# Patient Record
Sex: Male | Born: 1955 | Race: White | Hispanic: No | Marital: Married | Smoking: Never smoker
Health system: Southern US, Community
[De-identification: ages and names within clinical notes are randomized; demographics above are authoritative.]

## PROBLEM LIST (undated history)

## (undated) DIAGNOSIS — I513 Intracardiac thrombosis, not elsewhere classified: Secondary | ICD-10-CM

## (undated) DIAGNOSIS — E039 Hypothyroidism, unspecified: Secondary | ICD-10-CM

## (undated) DIAGNOSIS — Z91199 Patient's noncompliance with other medical treatment and regimen due to unspecified reason: Secondary | ICD-10-CM

## (undated) DIAGNOSIS — I5021 Acute systolic (congestive) heart failure: Secondary | ICD-10-CM

## (undated) DIAGNOSIS — F419 Anxiety disorder, unspecified: Secondary | ICD-10-CM

## (undated) DIAGNOSIS — I5042 Chronic combined systolic (congestive) and diastolic (congestive) heart failure: Secondary | ICD-10-CM

## (undated) DIAGNOSIS — E079 Disorder of thyroid, unspecified: Secondary | ICD-10-CM

## (undated) DIAGNOSIS — Z9119 Patient's noncompliance with other medical treatment and regimen: Secondary | ICD-10-CM

## (undated) DIAGNOSIS — I428 Other cardiomyopathies: Secondary | ICD-10-CM

## (undated) DIAGNOSIS — E785 Hyperlipidemia, unspecified: Secondary | ICD-10-CM

## (undated) DIAGNOSIS — L97509 Non-pressure chronic ulcer of other part of unspecified foot with unspecified severity: Secondary | ICD-10-CM

## (undated) DIAGNOSIS — I509 Heart failure, unspecified: Secondary | ICD-10-CM

## (undated) DIAGNOSIS — E119 Type 2 diabetes mellitus without complications: Secondary | ICD-10-CM

## (undated) HISTORY — DX: Intracardiac thrombosis, not elsewhere classified: I51.3

## (undated) HISTORY — DX: Non-pressure chronic ulcer of other part of unspecified foot with unspecified severity: L97.509

## (undated) HISTORY — DX: Patient's noncompliance with other medical treatment and regimen due to unspecified reason: Z91.199

## (undated) HISTORY — DX: Hypothyroidism, unspecified: E03.9

## (undated) HISTORY — DX: Patient's noncompliance with other medical treatment and regimen: Z91.19

## (undated) HISTORY — DX: Type 2 diabetes mellitus without complications: E11.9

## (undated) HISTORY — DX: Hyperlipidemia, unspecified: E78.5

## (undated) HISTORY — DX: Chronic combined systolic (congestive) and diastolic (congestive) heart failure: I50.42

## (undated) HISTORY — PX: FOOT SURGERY: SHX648

## (undated) HISTORY — DX: Anxiety disorder, unspecified: F41.9

## (undated) HISTORY — DX: Other cardiomyopathies: I42.8

## (undated) HISTORY — DX: Disorder of thyroid, unspecified: E07.9

---
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
## 2001-03-18 ENCOUNTER — Emergency Department (HOSPITAL_COMMUNITY): Admission: EM | Admit: 2001-03-18 | Discharge: 2001-03-18 | Payer: Self-pay | Admitting: Emergency Medicine

## 2003-09-25 ENCOUNTER — Emergency Department (HOSPITAL_COMMUNITY): Admission: EM | Admit: 2003-09-25 | Discharge: 2003-09-25 | Payer: Self-pay | Admitting: Emergency Medicine

## 2004-01-25 ENCOUNTER — Ambulatory Visit (HOSPITAL_COMMUNITY): Admission: RE | Admit: 2004-01-25 | Discharge: 2004-01-25 | Payer: Self-pay | Admitting: Family Medicine

## 2004-05-09 ENCOUNTER — Ambulatory Visit: Payer: Self-pay | Admitting: Psychiatry

## 2004-11-05 ENCOUNTER — Ambulatory Visit: Payer: Self-pay | Admitting: Psychiatry

## 2005-05-08 ENCOUNTER — Ambulatory Visit: Payer: Self-pay | Admitting: Psychiatry

## 2005-11-11 ENCOUNTER — Ambulatory Visit (HOSPITAL_COMMUNITY): Payer: Self-pay | Admitting: Psychiatry

## 2006-05-05 ENCOUNTER — Ambulatory Visit (HOSPITAL_COMMUNITY): Payer: Self-pay | Admitting: Psychiatry

## 2006-10-29 ENCOUNTER — Ambulatory Visit (HOSPITAL_COMMUNITY): Payer: Self-pay | Admitting: Psychiatry

## 2007-05-11 ENCOUNTER — Ambulatory Visit (HOSPITAL_COMMUNITY): Payer: Self-pay | Admitting: Psychiatry

## 2007-11-04 ENCOUNTER — Ambulatory Visit (HOSPITAL_COMMUNITY): Payer: Self-pay | Admitting: Psychiatry

## 2007-12-28 ENCOUNTER — Ambulatory Visit (HOSPITAL_COMMUNITY): Admission: RE | Admit: 2007-12-28 | Discharge: 2007-12-28 | Payer: Self-pay | Admitting: General Surgery

## 2008-04-27 ENCOUNTER — Ambulatory Visit (HOSPITAL_COMMUNITY): Payer: Self-pay | Admitting: Psychiatry

## 2008-11-09 ENCOUNTER — Ambulatory Visit (HOSPITAL_COMMUNITY): Payer: Self-pay | Admitting: Psychiatry

## 2009-06-07 ENCOUNTER — Ambulatory Visit (HOSPITAL_COMMUNITY): Payer: Self-pay | Admitting: Psychiatry

## 2009-12-04 ENCOUNTER — Ambulatory Visit (HOSPITAL_COMMUNITY): Payer: Self-pay | Admitting: Psychiatry

## 2010-03-05 ENCOUNTER — Ambulatory Visit (HOSPITAL_COMMUNITY): Payer: Self-pay | Admitting: Psychiatry

## 2010-06-04 ENCOUNTER — Ambulatory Visit (HOSPITAL_COMMUNITY): Payer: Self-pay | Admitting: Psychiatry

## 2010-11-12 NOTE — H&P (Signed)
NAME:  Stanley Levy, Stanley Levy                ACCOUNT NO.:  [id number]   MEDICAL RECORD NO.:  [id number]          PATIENT TYPE:  AMB   LOCATION:  DAY                           FACILITY:  [facility]   PHYSICIAN:  Dalia Heading, M.D.  DATE OF BIRTH:  1955-07-09   DATE OF ADMISSION:  DATE OF DISCHARGE:  LH                              HISTORY & PHYSICAL   CHIEF COMPLAINT:  Need for screening colonoscopy.   HISTORY OF PRESENT ILLNESS:  The patient is a 55 year old white male who  is referred for endoscopic evaluation.  He needs a colonoscopy for  screening purposes.  No abdominal pain, weight loss, nausea, vomiting,  diarrhea, constipation, melena, or hematochezia have been noted.  He has  never had a coloscopy.  There is no family history of colon carcinoma.   PAST MEDICAL HISTORY:  1. Hypertension.  2. Migraine headaches.   PAST SURGICAL HISTORY:  Right foot surgery.   CURRENT MEDICATIONS:  Imipramine and metoprolol.   ALLERGIES:  No known drug allergies.   REVIEW OF SYSTEMS:  Noncontributory.   PHYSICAL EXAMINATION:  VITAL SIGNS:  The patient is a well-developed,  well-nourished white male, in no acute distress.  LUNGS:  Clear to auscultation with equal breath sounds bilaterally.  HEART:  Reveals regular rate and rhythm without S3, S4, or murmurs.  ABDOMEN:  Soft, nontender, and nondistended.  No hepatosplenomegaly or  masses are noted.  RECTAL:  Deferred to the procedure.   IMPRESSION:  Need for screening colonoscopy.   PLAN:  The patient is scheduled for a colonoscopy on December 28, 2007.  The  risks and benefits of the procedure including bleeding and perforation  were fully explained to the patient, gave informed consent.      Dalia Heading, M.D.  Electronically Signed     MAJ/MEDQ  D:  12/23/2007  T:  12/24/2007  Job:  [id number]   cc:   Patrica Duel, M.D.  Fax: [phone]

## 2010-11-28 ENCOUNTER — Encounter (INDEPENDENT_AMBULATORY_CARE_PROVIDER_SITE_OTHER): Payer: BC Managed Care – PPO | Admitting: Psychiatry

## 2010-11-28 DIAGNOSIS — F4001 Agoraphobia with panic disorder: Secondary | ICD-10-CM

## 2011-05-29 ENCOUNTER — Encounter (HOSPITAL_COMMUNITY): Payer: Self-pay | Admitting: Psychiatry

## 2011-05-29 ENCOUNTER — Ambulatory Visit (INDEPENDENT_AMBULATORY_CARE_PROVIDER_SITE_OTHER): Payer: No Typology Code available for payment source | Admitting: Psychiatry

## 2011-05-29 ENCOUNTER — Encounter (HOSPITAL_COMMUNITY): Payer: BC Managed Care – PPO | Admitting: Psychiatry

## 2011-05-29 DIAGNOSIS — F411 Generalized anxiety disorder: Secondary | ICD-10-CM

## 2011-05-29 DIAGNOSIS — F419 Anxiety disorder, unspecified: Secondary | ICD-10-CM

## 2011-05-29 DIAGNOSIS — Z79899 Other long term (current) drug therapy: Secondary | ICD-10-CM

## 2011-05-29 MED ORDER — IMIPRAMINE HCL 50 MG PO TABS: 50.0000 mg | ORAL_TABLET | Freq: Every day | ORAL | Status: DC

## 2011-05-29 NOTE — Progress Notes (Signed)
Patient came for his followup appointment. He's been stable on his medication. He takes imipramine at bedtime and sleep good. He reported no side effects of medication. He is wondering if his wife can be seen in this office for counseling and therapy. He had a good Thanksgiving. Denies any panic attack anxiety attack crying spells insomnia or agitation.  Mental status examination  Patient is casually dressed and fairly groomed. He is somewhat anxious but calm and cooperative. His speech is slow but clear and coherent. He denies any auditory hallucinations suicidal thoughts or homicidal thoughts. There no psychotic symptoms. His attention and concentration is fair. His speech is slow but coherent. His alert and oriented x3. His insight judgment and pulse control is okay.  Assessment Anxiety disorder NOS  Plan I will continue imipramine 50 mg at bedtime. We will do routine blood test. We will schedule the appointment for his wife with the therapist in this office. I have explained risks and benefits of medication. I will see him again in 6 months

## 2011-05-30 LAB — CBC WITH DIFFERENTIAL/PLATELET
Basophils Absolute: 0 10*3/uL (ref 0.0–0.1)
Eosinophils Absolute: 0.2 10*3/uL (ref 0.0–0.7)
Eosinophils Relative: 3 % (ref 0–5)
Hemoglobin: 13.1 g/dL (ref 13.0–17.0)
Lymphs Abs: 1.9 10*3/uL (ref 0.7–4.0)
MCH: 30.2 pg (ref 26.0–34.0)
Monocytes Absolute: 0.5 10*3/uL (ref 0.1–1.0)
Neutro Abs: 3.4 10*3/uL (ref 1.7–7.7)
Platelets: 175 10*3/uL (ref 150–400)

## 2011-05-30 LAB — COMPREHENSIVE METABOLIC PANEL
ALT: 15 U/L (ref 0–53)
Alkaline Phosphatase: 60 U/L (ref 39–117)
CO2: 24 mEq/L (ref 19–32)
Chloride: 102 mEq/L (ref 96–112)
Sodium: 139 mEq/L (ref 135–145)
Total Bilirubin: 0.4 mg/dL (ref 0.3–1.2)

## 2011-05-30 LAB — HEMOGLOBIN A1C: Hgb A1c MFr Bld: 6.7 % — ABNORMAL HIGH (ref <5.7)

## 2011-06-07 ENCOUNTER — Other Ambulatory Visit (HOSPITAL_COMMUNITY): Payer: Self-pay | Admitting: Psychiatry

## 2011-08-08 ENCOUNTER — Other Ambulatory Visit (HOSPITAL_COMMUNITY): Payer: Self-pay | Admitting: Psychiatry

## 2011-08-12 ENCOUNTER — Other Ambulatory Visit (HOSPITAL_COMMUNITY): Payer: Self-pay | Admitting: Psychiatry

## 2011-11-10 ENCOUNTER — Other Ambulatory Visit (HOSPITAL_COMMUNITY): Payer: Self-pay | Admitting: Psychiatry

## 2011-11-10 DIAGNOSIS — F411 Generalized anxiety disorder: Secondary | ICD-10-CM

## 2011-11-11 ENCOUNTER — Other Ambulatory Visit (HOSPITAL_COMMUNITY): Payer: Self-pay | Admitting: Psychiatry

## 2011-11-25 ENCOUNTER — Encounter (HOSPITAL_COMMUNITY): Payer: Self-pay | Admitting: Psychiatry

## 2011-11-25 ENCOUNTER — Ambulatory Visit (INDEPENDENT_AMBULATORY_CARE_PROVIDER_SITE_OTHER): Payer: No Typology Code available for payment source | Admitting: Psychiatry

## 2011-11-25 VITALS — Wt 198.0 lb

## 2011-11-25 DIAGNOSIS — F411 Generalized anxiety disorder: Secondary | ICD-10-CM

## 2011-11-25 DIAGNOSIS — F419 Anxiety disorder, unspecified: Secondary | ICD-10-CM | POA: Insufficient documentation

## 2011-11-25 MED ORDER — IMIPRAMINE HCL 50 MG PO TABS: 50.0000 mg | ORAL_TABLET | Freq: Every day | ORAL | Status: DC

## 2011-11-25 NOTE — Progress Notes (Signed)
Chief complaint Medication management and followup.  History of presenting illness Patient is 56 year old Caucasian married employed male who came for his followup appointment.  Patient has been compliant with her psychiatric medication.  He denies any side effects of the medication.  He sleeps better.  He denies any recent panic attack or nervousness.  He likes his job.  He has recently seen his primary care physician who started him on cholesterol lowering medication .  He denies any agitation anger or mood swings.  Sleep and appetite is unchanged from the past.  He's not drinking or using any illegal substance.  Current psychiatric medication Imipramine 50 mg at bedtime   Past psychiatric history Patient has been seeing in this office since 2002.  He denies any history of psychiatric inpatient treatment or any suicidal attempt.  He is a long history of anxiety disorder.    Medical history Patient has history of hypothyroidism and recently he was diagnosed with hyperlipidemia.  He has one reading of high hemoglobin A1c however he was never recommended by his primary care physician to start over hyperglycemic medication.  His primary care physician is Dr. Nelva Bush at [facility] physician.    Social history Patient has been married for 22 years.  He has no children.  He lives with his wife.  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  He likes his job.  Mental status emanation Patient is casually dressed and fairly groomed. He is somewhat anxious but calm and cooperative. His speech is slow but clear and coherent.  He described his mood is good and his affect is mood congruent.  He denies any auditory hallucinations suicidal thoughts or homicidal thoughts.  He denies any active or passive suicidal thoughts.  There no psychotic symptoms. His attention and concentration is fair.  He is alert and oriented x3. His insight judgment and pulse control is okay.  Assessment Axis I Anxiety  disorder NOS Axis II deferred Axis III hypothyroidism and hyperlipidemia. Axis IV mild to moderate  Plan I will continue imipramine 50 mg at bedtime.  His blood test in November shows hemoglobin A1c 6.7 however he has seen his primary care physician who mentioned that he does not need any medication for high blood sugar.  He was recently started on cholesterol medication.  I have explained risks and benefits of medication in detail.  I will see him again in 6 months.  I recommend to call us if he has any question or concern he feel worsening of the symptoms.

## 2012-02-29 DEATH — deceased

## 2012-05-25 ENCOUNTER — Encounter (HOSPITAL_COMMUNITY): Payer: Self-pay | Admitting: Psychiatry

## 2012-05-25 ENCOUNTER — Ambulatory Visit (INDEPENDENT_AMBULATORY_CARE_PROVIDER_SITE_OTHER): Payer: No Typology Code available for payment source | Admitting: Psychiatry

## 2012-05-25 VITALS — BP 144/86 | HR 80 | Ht 68.0 in | Wt 207.4 lb

## 2012-05-25 DIAGNOSIS — F419 Anxiety disorder, unspecified: Secondary | ICD-10-CM

## 2012-05-25 DIAGNOSIS — F411 Generalized anxiety disorder: Secondary | ICD-10-CM

## 2012-05-25 MED ORDER — IMIPRAMINE HCL 50 MG PO TABS: 50.0000 mg | ORAL_TABLET | Freq: Every day | ORAL | Status: DC

## 2012-05-25 NOTE — Progress Notes (Signed)
Chief complaint Medication management and followup.  History of presenting illness Patient is 56 year old Caucasian married employed male who came for his followup appointment. He is complaint with his medications.  He is noting good results with the meds and no side effects.  He is not using any substances other than the meds prescribed for his hyperlipidemia and thyroid and anxiety.  Current psychiatric medication Imipramine 50 mg at bedtime   Past psychiatric history Patient has been seeing in this office since 2002.  He denies any history of psychiatric inpatient treatment or any suicidal attempt.  He is a long history of anxiety disorder.    Medical history Patient has history of hypothyroidism and recently he was diagnosed with hyperlipidemia.  He has one reading of high hemoglobin A1c however he was never recommended by his primary care physician to start over hyperglycemic medication.  His primary care physician is Dr. Nelva Bush at [facility] physician.    Social history Patient has been married for 22 years.  He has no children.  He lives with his wife.  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  He likes his job.  Mental status emanation Patient is casually dressed and fairly groomed. He is somewhat anxious but calm and cooperative. His speech is slow but clear and coherent.  He described his mood is good and his affect is mood congruent.  He denies any auditory hallucinations suicidal thoughts or homicidal thoughts.  He denies any active or passive suicidal thoughts.  There no psychotic symptoms. His attention and concentration is fair.  He is alert and oriented x3. His insight judgment and pulse control is okay.  Assessment Axis I Anxiety disorder NOS Axis II deferred Axis III hypothyroidism and hyperlipidemia. Axis IV mild to moderate  Plan I took his vitals.  I reviewed CC, tobacco/med/surg Hx, meds effects/ side effects, problem list, therapies and responses as  well as current situation/symptoms discussed options. See orders and pt instructions for more details.

## 2012-05-25 NOTE — Patient Instructions (Signed)
Keep up the exercise with work and going to the gym.  Explore some means to do meditation on a daily basis.  Music is great.  "[employer] by Camelia Phenes could be very helpful.

## 2012-08-25 ENCOUNTER — Ambulatory Visit (INDEPENDENT_AMBULATORY_CARE_PROVIDER_SITE_OTHER): Payer: BC Managed Care – PPO | Admitting: Psychiatry

## 2012-08-25 ENCOUNTER — Encounter (HOSPITAL_COMMUNITY): Payer: Self-pay | Admitting: Psychiatry

## 2012-08-25 VITALS — Wt 204.8 lb

## 2012-08-25 DIAGNOSIS — F419 Anxiety disorder, unspecified: Secondary | ICD-10-CM

## 2012-08-25 MED ORDER — IMIPRAMINE HCL 50 MG PO TABS: 50.0000 mg | ORAL_TABLET | Freq: Every day | ORAL | Status: DC

## 2012-08-25 NOTE — Patient Instructions (Signed)
Keep it up.  Call if problems or concerns.

## 2012-08-25 NOTE — Progress Notes (Signed)
Stanley [facility] [id number] Progress Note Stanley Levy MRN: [id number] DOB: Aug 07, 1955 Age: 57 y.o.  Date: 08/25/2012 Start Time: 3:20 PM End Time: 3:30 PM  Chief Complaint: Chief Complaint  Patient presents with  . Anxiety  . Follow-up  . Medication Refill   Subjective: "The medicine helps he sleep good". Depression 0/10 and Anxiety 0/10, where 1 is the best and 10 is the worst. Pain issues 1 or 2/10  History of presenting illness Patient is 57 year old Caucasian married employed male who came for his followup appointment. Pt reports that he is compliant with the psychotropic medications with good benefit and no noticeable side effects.  He works out 3 days a week with good results.  Current psychiatric medication Imipramine 50 mg at bedtime   Vitals: Wt 204 lb 12.8 oz (92.897 kg)  BMI 31.15 kg/m2  Past psychiatric history Patient has been seeing in this office since 2002.  He denies any history of psychiatric inpatient treatment or any suicidal attempt.  He is a long history of anxiety disorder.    Medical history Patient has history of hypothyroidism and recently he was diagnosed with hyperlipidemia.  He has one reading of high hemoglobin A1c however he was never recommended by his primary care physician to start over hyperglycemic medication.  His primary care physician is Dr. Nelva Bush at [facility] physician.    Social history Patient has been married for 22 years.  He has no children.  He lives with his wife.  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  He likes his job.  Mental status emanation Patient is casually dressed and fairly groomed. He is somewhat anxious but calm and cooperative. His speech is slow but clear and coherent.  He described his mood is good and his affect is mood congruent.  He denies any auditory hallucinations suicidal thoughts or homicidal thoughts.  He denies any active or passive suicidal thoughts.  There no psychotic symptoms. His  attention and concentration is fair.  He is alert and oriented x3. His insight judgment and pulse control is okay.  Lab Results: No results found for this or any previous visit (from the past 8736 hour(s)). PCP draws periodic labs with no concerning trends noted.  Assessment Axis I Anxiety disorder NOS Axis II deferred Axis III hypothyroidism and hyperlipidemia. Axis IV mild to moderate  Plan/Discussion: I took his vitals.  I reviewed CC, tobacco/med/surg Hx, meds effects/ side effects, problem list, therapies and responses as well as current situation/symptoms discussed options. The current medical regimen is effective;  continue present plan and medications. See orders and pt instructions for more details.  Medical Decision Making Problem Points:  Established problem, stable/improving (1), Review of last therapy session (1) and Review of psycho-social stressors (1) Data Points:  Review or order clinical lab tests (1) Review of medication regiment & side effects (2)  I certify that outpatient services furnished can reasonably be expected to improve the patient's condition.   Orson Aloe, MD, [facility]

## 2012-12-24 ENCOUNTER — Encounter (HOSPITAL_COMMUNITY): Payer: Self-pay | Admitting: Psychiatry

## 2012-12-24 ENCOUNTER — Ambulatory Visit (INDEPENDENT_AMBULATORY_CARE_PROVIDER_SITE_OTHER): Payer: BC Managed Care – PPO | Admitting: Psychiatry

## 2012-12-24 VITALS — BP 120/60 | Ht 67.25 in | Wt 199.8 lb

## 2012-12-24 DIAGNOSIS — F419 Anxiety disorder, unspecified: Secondary | ICD-10-CM

## 2012-12-24 DIAGNOSIS — F411 Generalized anxiety disorder: Secondary | ICD-10-CM

## 2012-12-24 MED ORDER — IMIPRAMINE HCL 50 MG PO TABS: 50.0000 mg | ORAL_TABLET | Freq: Every day | ORAL | Status: DC

## 2012-12-24 NOTE — Progress Notes (Signed)
[facility] [address] Progress Note Stanley Levy MRN: [id number] DOB: [birth year] Age: 57 y.o.  Date: 12/24/2012 Start Time: 2:30 PM End Time: 3:40 PM  Chief Complaint: Chief Complaint  Patient presents with  . Anxiety  . Follow-up  . Medication Refill   Subjective: "I'm doing well". Depression 0/10 and Anxiety 0/10, where 1 is the best and 10 is the worst. Pain issues 0/10  History of presenting illness Patient is 57 year old Caucasian married employed male who came for his followup appointment. Pt reports that he is compliant with the psychotropic medications with good benefit and no noticeable side effects.  Still working out 3 days a week.  Current psychiatric medication Imipramine 50 mg at bedtime   Vitals: BP 120/60  Ht 5' 7.25" (1.708 m)  Wt 199 lb 12.8 oz (90.629 kg)  BMI 31.07 kg/m2  Past psychiatric history Patient has been seeing in this office since 2002.  He denies any history of psychiatric inpatient treatment or any suicidal attempt.  He is a long history of anxiety disorder.   Allergies: No Known Allergies Medical History: Past Medical History  Diagnosis Date  . Hyperlipidemia   . Anxiety   . Thyroid disease   Patient has history of hypothyroidism and recently he was diagnosed with hyperlipidemia.  He has one reading of high hemoglobin A1c however he was never recommended by his primary care physician to start over hyperglycemic medication.  His primary care physician is Dr. Nelva Bush at [facility] physician.    Surgical History: Past Surgical History  Procedure Laterality Date  . Foot surgery     Family History: family history is negative for Anxiety disorder, and Dementia, and Alcohol abuse, and Drug abuse, and ADD / ADHD, and Bipolar disorder, and Depression, and OCD, and Paranoid behavior, and Physical abuse, and Schizophrenia, and Seizures, and Sexual abuse, . Reviewed and nothing is new today.  Social history Patient has been married for 22  years.  He has no children.  He lives with his wife.  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  He likes his job.  Mental status emanation Patient is casually dressed and fairly groomed. He is somewhat anxious but calm and cooperative. His speech is slow but clear and coherent.  He described his mood is good and his affect is mood congruent.  He denies any auditory hallucinations suicidal thoughts or homicidal thoughts.  He denies any active or passive suicidal thoughts.  There no psychotic symptoms. His attention and concentration is fair.  He is alert and oriented x3. His insight judgment and pulse control is okay.  Lab Results: No results found for this or any previous visit (from the past 8736 hour(s)). PCP draws periodic labs with no concerning trends noted.  Assessment Axis I Anxiety disorder NOS Axis II deferred Axis III hypothyroidism and hyperlipidemia. Axis IV mild to moderate  Plan/Discussion: I took his vitals.  I reviewed CC, tobacco/med/surg Hx, meds effects/ side effects, problem list, therapies and responses as well as current situation/symptoms discussed options. Continue current effective medications. See orders and pt instructions for more details. MEDICATIONS this encounter: Meds ordered this encounter  Medications  . imipramine (TOFRANIL) 50 MG tablet    Sig: Take 1 tablet (50 mg total) by mouth at bedtime.    Dispense:  30 tablet    Refill:  4   Medical Decision Making Problem Points:  Established problem, stable/improving (1), Review of last therapy session (1) and Review of psycho-social  stressors (1) Data Points:  Review or order clinical lab tests (1) Review of medication regiment & side effects (2)  I certify that outpatient services furnished can reasonably be expected to improve the patient's condition.   Orson Aloe, MD, [facility]

## 2012-12-24 NOTE — Patient Instructions (Signed)
Keep it up.  Get into some creative pursuit.  Call if problems or concerns.

## 2013-04-21 ENCOUNTER — Ambulatory Visit (HOSPITAL_COMMUNITY): Payer: Self-pay | Admitting: Psychiatry

## 2013-04-21 ENCOUNTER — Ambulatory Visit (INDEPENDENT_AMBULATORY_CARE_PROVIDER_SITE_OTHER): Payer: BC Managed Care – PPO | Admitting: Psychiatry

## 2013-04-21 ENCOUNTER — Encounter (HOSPITAL_COMMUNITY): Payer: Self-pay | Admitting: Psychiatry

## 2013-04-21 VITALS — BP 120/78 | Ht 68.0 in | Wt 200.0 lb

## 2013-04-21 DIAGNOSIS — F419 Anxiety disorder, unspecified: Secondary | ICD-10-CM

## 2013-04-21 DIAGNOSIS — F411 Generalized anxiety disorder: Secondary | ICD-10-CM

## 2013-04-21 MED ORDER — IMIPRAMINE HCL 50 MG PO TABS: 50.0000 mg | ORAL_TABLET | Freq: Every day | ORAL | Status: DC

## 2013-04-21 NOTE — Progress Notes (Signed)
Patient ID: Stanley Levy, male   DOB: 1956/06/26, 57 y.o.   MRN: [id number] [facility] [address] Progress Note GARO HEIDELBERG MRN: [id number] DOB: [birth year] Age: 57 y.o.  Date: 04/21/2013 Start Time: 2:30 PM End Time: 3:40 PM  Chief Complaint: Chief Complaint  Patient presents with  . Anxiety  . Follow-up   Subjective: "I'm doing well".  This patient is a 57 year old married white male who lives with his wife in [place]. They have no children. 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  The patient states about 15 years ago he became very anxious after his mother died. He began having recurrent panic attacks. His family doctor referred him to a psychiatrist in [place] who started him on imipramine and Xanax. After a while he no longer needed the Xanax but the imipramine has kept the panic attacks at bay. His mood is good and he is very active at work and he also goes to the gym to lift weights. He takes very good care of his health. He only has elevated cholesterol and mild hypothyroidism for his health problems..  Current psychiatric medication Imipramine 50 mg at bedtime   Vitals: BP 120/78  Ht 5\' 8"  (1.727 m)  Wt 200 lb (90.719 kg)  BMI 30.42 kg/m2  Past psychiatric history Patient has been seeing in this office since 2002.  He denies any history of psychiatric inpatient treatment or any suicidal attempt.  He is a long history of anxiety disorder.   Allergies: No Known Allergies Medical History: Past Medical History  Diagnosis Date  . Hyperlipidemia   . Anxiety   . Thyroid disease   Patient has history of hypothyroidism and recently he was diagnosed with hyperlipidemia.  He has one reading of high hemoglobin A1c however he was never recommended by his primary care physician to start over hyperglycemic medication.  His primary care physician is Dr. Nelva Bush at [facility] physician.    Surgical History: Past Surgical History  Procedure Laterality Date   . Foot surgery     Family History: family history is negative for Anxiety disorder, Dementia, Alcohol abuse, Drug abuse, ADD / ADHD, Bipolar disorder, Depression, OCD, Paranoid behavior, Physical abuse, Schizophrenia, Seizures, and Sexual abuse. Reviewed and nothing is new today.  Social history Patient has been married for 22 years.  He has no children.  He lives with his wife.  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  He likes his job.  Mental status emanation Patient is casually dressed and fairly groomed. He is t calm and cooperative. His speech is  clear and coherent.  He described his mood is good and his affect is mood congruent.  He denies any auditory hallucinations suicidal thoughts or homicidal thoughts.  He denies any active or passive suicidal thoughts.  There no psychotic symptoms. His attention and concentration is fair.  He is alert and oriented x3. His insight judgment and pulse control is okay.  Lab Results: No results found for this or any previous visit (from the past 8736 hour(s)). PCP draws periodic labs with no concerning trends noted.  Assessment Axis I Anxiety disorder NOS Axis II deferred Axis III hypothyroidism and hyperlipidemia. Axis IV mild to moderate  Plan/Discussion: I took his vitals.  I reviewed CC, tobacco/med/surg Hx, meds effects/ side effects, problem list, therapies and responses as well as current situation/symptoms discussed options. Continue current effective medications. He will return in 5 months See orders and pt instructions for  more details. MEDICATIONS this encounter: Meds ordered this encounter  Medications  . imipramine (TOFRANIL) 50 MG tablet    Sig: Take 1 tablet (50 mg total) by mouth at bedtime.    Dispense:  30 tablet    Refill:  4   Medical Decision Making Problem Points:  Established problem, stable/improving (1), Review of last therapy session (1) and Review of psycho-social stressors (1) Data Points:   Review or order clinical lab tests (1) Review of medication regiment & side effects (2)  I certify that outpatient services furnished can reasonably be expected to improve the patient's condition.   [name], MD

## 2013-09-09 ENCOUNTER — Telehealth (HOSPITAL_COMMUNITY): Payer: Self-pay | Admitting: *Deleted

## 2013-09-14 ENCOUNTER — Encounter (HOSPITAL_COMMUNITY): Payer: Self-pay | Admitting: Psychiatry

## 2013-09-14 ENCOUNTER — Ambulatory Visit (INDEPENDENT_AMBULATORY_CARE_PROVIDER_SITE_OTHER): Payer: BC Managed Care – PPO | Admitting: Psychiatry

## 2013-09-14 VITALS — BP 120/70 | Ht 68.0 in | Wt 187.0 lb

## 2013-09-14 DIAGNOSIS — F411 Generalized anxiety disorder: Secondary | ICD-10-CM

## 2013-09-14 DIAGNOSIS — F419 Anxiety disorder, unspecified: Secondary | ICD-10-CM

## 2013-09-14 MED ORDER — IMIPRAMINE HCL 50 MG PO TABS: 50.0000 mg | ORAL_TABLET | Freq: Every day | ORAL | Status: DC

## 2013-09-14 NOTE — Progress Notes (Signed)
Patient ID: Stanley Levy, male   DOB: 08/23/1955, 58 y.o.   MRN: [id number] Patient ID: Stanley Levy, male   DOB: 07/11/1955, 58 y.o.   MRN: [id number] [facility] [id number] Progress Note Stanley Levy MRN: [id number] DOB: 09/11/1955 Age: 58 y.o.  Date: 09/14/2013 Start Time: 2:30 PM End Time: 3:40 PM  Chief Complaint: Chief Complaint  Patient presents with  . Anxiety  . Head Injury  . Follow-up   Subjective: "I'm doing well".  This patient is a 58 year old married white male who lives with his wife in [place]. They have no children. He works for [employer] as a Designer, television/film setloader.  The patient states about 15 years ago he became very anxious after his mother died. He began having recurrent panic attacks. His family doctor referred him to a psychiatrist in [place] who started him on imipramine and Xanax. After a while he no longer needed the Xanax but the imipramine has kept the panic attacks at bay. His mood is good and he is very active at work and he also goes to the gym to lift weights. He takes very good care of his health. He only has elevated cholesterol and mild hypothyroidism for his health problems..  The patient returns after 5 months. In general he is doing well. However he developed type 2 diabetes in his primary doctor now has him on metformin. To his credit he has lost 13 pounds and is eating differently he is checking his sugars at home and they're actually on the low side. He is hoping to get off the metformin eventually. His mood is been stable and he's had no further panic attacks  Current psychiatric medication Imipramine 50 mg at bedtime   Vitals: BP 120/70  Ht 5\' 8"  (1.727 m)  Wt 187 lb (84.823 kg)  BMI 28.44 kg/m2  Past psychiatric history Patient has been seeing in this office since 2002.  He denies any history of psychiatric inpatient treatment or any suicidal attempt.  He is a long history of anxiety disorder.   Allergies: No Known  Allergies Medical History: Past Medical History  Diagnosis Date  . Hyperlipidemia   . Anxiety   . Thyroid disease   . Diabetes mellitus, type II   Patient has history of hypothyroidism and recently he was diagnosed with hyperlipidemia.  He has one reading of high hemoglobin A1c however he was never recommended by his primary care physician to start over hyperglycemic medication.  His primary care physician is [facility] at [facility].    Surgical History: Past Surgical History  Procedure Laterality Date  . Foot surgery     Family History: family history is negative for Anxiety disorder, Dementia, Alcohol abuse, Drug abuse, ADD / ADHD, Bipolar disorder, Depression, OCD, Paranoid behavior, Physical abuse, Schizophrenia, Seizures, and Sexual abuse. Reviewed and nothing is new today.  Social history Patient has been married for 22 years.  He has no children.  He lives with his wife.  He is working in [employer] for past 10 years.  He likes his job.  Mental status emanation Patient is casually dressed and fairly groomed. He is t calm and cooperative. His speech is  clear and coherent.  He described his mood is good and his affect is mood congruent.  He denies any auditory hallucinations suicidal thoughts or homicidal thoughts.  He denies any active or passive suicidal thoughts.  There no psychotic symptoms. His attention and concentration is fair.  He is  alert and oriented x3. His insight judgment and pulse control is okay.  Lab Results: No results found for this or any previous visit (from the past 8736 hour(s)). PCP draws periodic labs with no concerning trends noted.  Assessment Axis I Anxiety disorder NOS Axis II deferred Axis III hypothyroidism and hyperlipidemia. Axis IV mild to moderate  Plan/Discussion: I took his vitals.  I reviewed CC, tobacco/med/surg Hx, meds effects/ side effects, problem list, therapies and responses as well as current situation/symptoms  discussed options. Continue current effective medications. He will return in 5 months See orders and pt instructions for more details. MEDICATIONS this encounter: Meds ordered this encounter  Medications  . metFORMIN (GLUCOPHAGE-XR) 500 MG 24 hr tablet    Sig:   . imipramine (TOFRANIL) 50 MG tablet    Sig: Take 1 tablet (50 mg total) by mouth at bedtime.    Dispense:  30 tablet    Refill:  4   Medical Decision Making Problem Points:  Established problem, stable/improving (1), Review of last therapy session (1) and Review of psycho-social stressors (1) Data Points:  Review or order clinical lab tests (1) Review of medication regiment & side effects (2)  I certify that outpatient services furnished can reasonably be expected to improve the patient's condition.   [name], MD

## 2013-09-16 ENCOUNTER — Ambulatory Visit (HOSPITAL_COMMUNITY): Payer: Self-pay | Admitting: Psychiatry

## 2014-02-10 ENCOUNTER — Ambulatory Visit (INDEPENDENT_AMBULATORY_CARE_PROVIDER_SITE_OTHER): Payer: BC Managed Care – PPO | Admitting: Psychiatry

## 2014-02-10 ENCOUNTER — Encounter (HOSPITAL_COMMUNITY): Payer: Self-pay | Admitting: Psychiatry

## 2014-02-10 VITALS — BP 163/67 | HR 64 | Ht 68.0 in | Wt 173.0 lb

## 2014-02-10 DIAGNOSIS — F411 Generalized anxiety disorder: Secondary | ICD-10-CM

## 2014-02-10 MED ORDER — IMIPRAMINE HCL 50 MG PO TABS
50.0000 mg | ORAL_TABLET | Freq: Every day | ORAL | Status: DC
Start: 2014-02-10 — End: 2014-08-11

## 2014-02-10 NOTE — Progress Notes (Signed)
Patient ID: Stanley Levy, male   DOB: 07/10/1955, 58 y.o.   MRN: [id number] Patient ID: Stanley Levy, male   DOB: 03/13/1956, 58 y.o.   MRN: [id number] Patient ID: Stanley Levy, male   DOB: 06/30/1955, 58 y.o.   MRN: [id number] [facility] [id number] Progress Note Stanley Levy MRN: [id number] DOB: 08/18/1955 Age: 58 y.o.  Date: 02/10/2014 Start Time: 2:30 PM End Time: 3:40 PM  Chief Complaint: Chief Complaint  Patient presents with  . Anxiety  . Follow-up   Subjective: "I'm doing well".  This patient is a 58 year old married white male who lives with his [place] in [place]. They have no children. He works for [employer] as a Designer, television/film setloader.  The patient states about 15 years ago he became very anxious after his mother died. He began having recurrent panic attacks. His family doctor referred him to a psychiatrist in [place] who started him on imipramine and Xanax. After a while he no longer needed the Xanax but the imipramine has kept the panic attacks at bay. His mood is good and he is very active at work and he also goes to the gym to lift weights. He takes very good care of his health. He only has elevated cholesterol and mild hypothyroidism for his health problems..  The patient returns after 5 months. He is doing very well. His mood is been stable and he said no further panic attacks. 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 He has no current complaints  Current psychiatric medication Imipramine 50 mg at bedtime   Vitals: BP 163/67  Pulse 64  Ht 5\' 8"  (1.727 m)  Wt 173 lb (78.472 kg)  BMI 26.31 kg/m2  Past psychiatric history Patient has been seeing in this office since 2002.  He denies any history of psychiatric inpatient treatment or any suicidal attempt.  He is a long history of anxiety disorder.   Allergies: No Known Allergies Medical History: Past Medical History  Diagnosis Date  . Hyperlipidemia   .  Anxiety   . Thyroid disease   . Diabetes mellitus, type II   Patient has history of hypothyroidism and recently he was diagnosed with hyperlipidemia.  He has one reading of high hemoglobin A1c however he was never recommended by his primary care physician to start over hyperglycemic medication.  His primary care physician is [facility] at [facility].    Surgical History: Past Surgical History  Procedure Laterality Date  . Foot surgery     Family History: family history is negative for Anxiety disorder, Dementia, Alcohol abuse, Drug abuse, ADD / ADHD, Bipolar disorder, Depression, OCD, Paranoid behavior, Physical abuse, Schizophrenia, Seizures, and Sexual abuse. Reviewed and nothing is new today.  Social history Patient has been married for 22 years.  He has no children.  He lives with his wife.  He is working in [employer] for past 10 years.  He likes his job.  Mental status emanation Patient is casually dressed and fairly groomed. He is t calm and cooperative. His speech is  clear and coherent.  He described his mood is good and his affect is mood congruent.  He denies any auditory hallucinations suicidal thoughts or homicidal thoughts.  He denies any active or passive suicidal thoughts.  There no psychotic symptoms. His attention and concentration is fair.  He is alert and oriented x3. His insight judgment and pulse control is okay.  Lab Results: No results found for this or any previous visit (from the past 8736 hour(s)). PCP draws periodic labs with no concerning trends noted.  Assessment Axis I Anxiety disorder NOS Axis II deferred Axis III hypothyroidism and hyperlipidemia. Axis IV mild to moderate  Plan/Discussion: I took his vitals.  I reviewed CC, tobacco/med/surg Hx, meds effects/ side effects, problem list, therapies and responses as well as current situation/symptoms discussed options. Continue current effective medications. He will return in 6  months See orders and pt instructions for more details. MEDICATIONS this encounter: Meds ordered this encounter  Medications  . imipramine (TOFRANIL) 50 MG tablet    Sig: Take 1 tablet (50 mg total) by mouth at bedtime.    Dispense:  30 tablet    Refill:  5   Medical Decision Making Problem Points:  Established problem, stable/improving (1), Review of last therapy session (1) and Review of psycho-social stressors (1) Data Points:  Review or order clinical lab tests (1) Review of medication regiment & side effects (2)  I certify that outpatient services furnished can reasonably be expected to improve the patient's condition.   [name], MD

## 2014-08-11 ENCOUNTER — Encounter (HOSPITAL_COMMUNITY): Payer: Self-pay | Admitting: Psychiatry

## 2014-08-11 ENCOUNTER — Ambulatory Visit (INDEPENDENT_AMBULATORY_CARE_PROVIDER_SITE_OTHER): Payer: BLUE CROSS/BLUE SHIELD | Admitting: Psychiatry

## 2014-08-11 VITALS — BP 148/75 | HR 73 | Ht 68.0 in | Wt 183.0 lb

## 2014-08-11 DIAGNOSIS — F419 Anxiety disorder, unspecified: Secondary | ICD-10-CM | POA: Diagnosis not present

## 2014-08-11 DIAGNOSIS — F411 Generalized anxiety disorder: Secondary | ICD-10-CM

## 2014-08-11 MED ORDER — IMIPRAMINE HCL 50 MG PO TABS: 50.0000 mg | ORAL_TABLET | Freq: Every day | ORAL | Status: DC

## 2014-08-11 NOTE — Progress Notes (Signed)
Patient ID: KANAN SOBEK, male   DOB: [birth year], 59 y.o.   MRN: [id number] Patient ID: MAYER VONDRAK, male   DOB: 21-Feb-1956, 59 y.o.   MRN: [id number] Patient ID: VALENTINO SAAVEDRA, male   DOB: 15-Nov-1955, 59 y.o.   MRN: [id number] Patient ID: DELMON ANDRADA, male   DOB: [birth year], 59 y.o.   MRN: [id number] [facility] [id number] Progress Note KIMON LOEWEN MRN: [id number] DOB: 08-16-1955 Age: 59 y.o.  Date: 08/11/2014 Start Time: 2:30 PM End Time: 3:40 PM  Chief Complaint: Chief Complaint  Patient presents with  . Depression  . Anxiety  . Follow-up   Subjective: "I'm doing well".  This patient is a 59 year old married white male who lives with his wife in [place]. They have no children. 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  The patient states about 15 years ago he became very anxious after his mother died. He began having recurrent panic attacks. His family doctor referred him to a psychiatrist in [place] who started him on imipramine and Xanax. After a while he no longer needed the Xanax but the imipramine has kept the panic attacks at bay. His mood is good and he is very active at work and he also goes to the gym to lift weights. He takes very good care of his health. He only has elevated cholesterol and mild hypothyroidism for his health problems..  The patient returns after 6 months. He is doing very well. His mood is been stable and he said no further panic attacks. 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 He has no current complaints or new health problems  Current psychiatric medication Imipramine 50 mg at bedtime   Vitals: BP 148/75 mmHg  Pulse 73  Ht  (1.727 m)  Wt 183 lb (83.008 kg)  BMI 27.83 kg/m2  Past psychiatric history Patient has been seeing in this office since 2002.  He denies any history of psychiatric inpatient treatment or any suicidal attempt.  He is a long history of anxiety  disorder.   Allergies: No Known Allergies Medical History: Past Medical History  Diagnosis Date  . Hyperlipidemia   . Anxiety   . Thyroid disease   . Diabetes mellitus, type II   Patient has history of hypothyroidism and recently he was diagnosed with hyperlipidemia.  He has one reading of high hemoglobin A1c however he was never recommended by his primary care physician to start over hyperglycemic medication.  His primary care physician is Dr. Nelva Bush at [facility] physician.    Surgical History: Past Surgical History  Procedure Laterality Date  . Foot surgery     Family History: family history is negative for Anxiety disorder, Dementia, Alcohol abuse, Drug abuse, ADD / ADHD, Bipolar disorder, Depression, OCD, Paranoid behavior, Physical abuse, Schizophrenia, Seizures, and Sexual abuse. Reviewed and nothing is new today.  Social history Patient has been married for 22 years.  He has no children.  He lives with his wife.  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  He likes his job.  Mental status emanation Patient is casually dressed and fairly groomed. He is t calm and cooperative. His speech is  clear and coherent.  He described his mood is good and his affect is mood congruent.  He denies any auditory hallucinations suicidal thoughts or homicidal thoughts.  He denies any active or passive suicidal thoughts.  There  no psychotic symptoms. His attention and concentration is fair.  He is alert and oriented x3. His insight judgment and pulse control is okay.  Lab Results: No results found for this or any previous visit (from the past 8736 hour(s)). PCP draws periodic labs with no concerning trends noted.  Assessment Axis I Anxiety disorder NOS Axis II deferred Axis III hypothyroidism and hyperlipidemia. Axis IV mild to moderate  Plan/Discussion: I took his vitals.  I reviewed CC, tobacco/med/surg Hx, meds effects/ side effects, problem list, therapies and responses as  well as current situation/symptoms discussed options. Continue current effective medications. He will return in 6 months See orders and pt instructions for more details. MEDICATIONS this encounter: Meds ordered this encounter  Medications  . lisinopril (PRINIVIL,ZESTRIL) 5 MG tablet    Sig: Take 10 mg by mouth daily.     Refill:  11  . imipramine (TOFRANIL) 50 MG tablet    Sig: Take 1 tablet (50 mg total) by mouth at bedtime.    Dispense:  30 tablet    Refill:  5   Medical Decision Making Problem Points:  Established problem, stable/improving (1), Review of last therapy session (1) and Review of psycho-social stressors (1) Data Points:  Review or order clinical lab tests (1) Review of medication regiment & side effects (2)  I certify that outpatient services furnished can reasonably be expected to improve the patient's condition.   [name], MD

## 2015-02-09 ENCOUNTER — Ambulatory Visit (INDEPENDENT_AMBULATORY_CARE_PROVIDER_SITE_OTHER): Payer: BLUE CROSS/BLUE SHIELD | Admitting: Psychiatry

## 2015-02-09 ENCOUNTER — Encounter (HOSPITAL_COMMUNITY): Payer: Self-pay | Admitting: Psychiatry

## 2015-02-09 VITALS — BP 142/74 | HR 68 | Ht 68.0 in | Wt 194.2 lb

## 2015-02-09 DIAGNOSIS — F411 Generalized anxiety disorder: Secondary | ICD-10-CM

## 2015-02-09 DIAGNOSIS — F419 Anxiety disorder, unspecified: Secondary | ICD-10-CM

## 2015-02-09 MED ORDER — IMIPRAMINE HCL 50 MG PO TABS: 50.0000 mg | ORAL_TABLET | Freq: Every day | ORAL | Status: DC

## 2015-02-09 NOTE — Progress Notes (Signed)
Patient ID: PANFILO SIBAJA, male   DOB: [birth year], 59 y.o.   MRN: [id number] Patient ID: FAWZI LAVINDER, male   DOB: Jul 28, 1955, 59 y.o.   MRN: [id number] Patient ID: EATHON EVELAND, male   DOB: [birth year], 59 y.o.   MRN: [id number] Patient ID: MELVON SUKO, male   DOB: [birth year], 59 y.o.   MRN: [id number] Patient ID: ADOLPHE LEONETTI, male   DOB: 1955/08/02, 59 y.o.   MRN: [id number] [facility] [id number] Progress Note GUNTER GOODNER MRN: [id number] DOB: [birth year] Age: 59 y.o.  Date: 02/09/2015 Start Time: 2:30 PM End Time: 3:40 PM  Chief Complaint: Chief Complaint  Patient presents with  . Depression  . Anxiety  . Follow-up   Subjective: "I'm doing well".  This patient is a 59 year old married white male who lives with his wife in [place]. They have no children. 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  The patient states about 15 years ago he became very anxious after his mother died. He began having recurrent panic attacks. His family doctor referred him to a psychiatrist in [place] who started him on imipramine and Xanax. After a while he no longer needed the Xanax but the imipramine has kept the panic attacks at bay. His mood is good and he is very active at work and he also goes to the gym to lift weights. He takes very good care of his health. He only has elevated cholesterol and mild hypothyroidism for his health problems..  The patient returns after 6 months. He is doing very well. His mood is been stable and he said no further panic attacks. 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 He has no current complaints or new health problems. He is a little concerned about his high blood pressure but it's only slightly elevated today  Current psychiatric medication Imipramine 50 mg at bedtime   Vitals: BP 142/74 mmHg  Pulse 68  Ht 5\' 8"  (1.727 m)  Wt 194 lb 3.2 oz (88.089 kg)  BMI 29.54 kg/m2  Past psychiatric  history Patient has been seeing in this office since 2002.  He denies any history of psychiatric inpatient treatment or any suicidal attempt.  He is a long history of anxiety disorder.   Allergies: No Known Allergies Medical History: Past Medical History  Diagnosis Date  . Hyperlipidemia   . Anxiety   . Thyroid disease   . Diabetes mellitus, type II   Patient has history of hypothyroidism and recently he was diagnosed with hyperlipidemia.  He has one reading of high hemoglobin A1c however he was never recommended by his primary care physician to start over hyperglycemic medication.  His primary care physician is Dr. Nelva Bush at [facility] physician.    Surgical History: Past Surgical History  Procedure Laterality Date  . Foot surgery     Family History: family history is negative for Anxiety disorder, Dementia, Alcohol abuse, Drug abuse, ADD / ADHD, Bipolar disorder, Depression, OCD, Paranoid behavior, Physical abuse, Schizophrenia, Seizures, and Sexual abuse. Reviewed and nothing is new today.  Social history Patient has been married for 22 years.  He has no children.  He lives with his wife.  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  He likes his job.  Mental status emanation Patient is casually dressed and fairly groomed. He is t calm and cooperative. His speech is  clear and coherent.  He described his mood is good and his affect is mood congruent.  He denies any auditory hallucinations suicidal thoughts or homicidal thoughts.  He denies any active or passive suicidal thoughts.  There no psychotic symptoms. His attention and concentration is fair.  He is alert and oriented x3. His insight judgment and pulse control is okay.  Lab Results: No results found for this or any previous visit (from the past 8736 hour(s)). PCP draws periodic labs with no concerning trends noted.  Assessment Axis I Anxiety disorder NOS Axis II deferred Axis III hypothyroidism and  hyperlipidemia. Axis IV mild to moderate  Plan/Discussion: I took his vitals.  I reviewed CC, tobacco/med/surg Hx, meds effects/ side effects, problem list, therapies and responses as well as current situation/symptoms discussed options. Continue imipramine for depression and anxiety. He will return in 6 months See orders and pt instructions for more details. MEDICATIONS this encounter: Meds ordered this encounter  Medications  . imipramine (TOFRANIL) 50 MG tablet    Sig: Take 1 tablet (50 mg total) by mouth at bedtime.    Dispense:  30 tablet    Refill:  5   Medical Decision Making Problem Points:  Established problem, stable/improving (1), Review of last therapy session (1) and Review of psycho-social stressors (1) Data Points:  Review or order clinical lab tests (1) Review of medication regiment & side effects (2)  I certify that outpatient services furnished can reasonably be expected to improve the patient's condition.   [name], MD

## 2015-08-10 ENCOUNTER — Ambulatory Visit (HOSPITAL_COMMUNITY): Payer: Self-pay | Admitting: Psychiatry

## 2015-08-20 ENCOUNTER — Encounter (HOSPITAL_COMMUNITY): Payer: Self-pay | Admitting: Psychiatry

## 2015-08-20 ENCOUNTER — Ambulatory Visit (INDEPENDENT_AMBULATORY_CARE_PROVIDER_SITE_OTHER): Payer: BLUE CROSS/BLUE SHIELD | Admitting: Psychiatry

## 2015-08-20 VITALS — BP 139/83 | HR 93 | Ht 68.0 in | Wt 197.4 lb

## 2015-08-20 DIAGNOSIS — F411 Generalized anxiety disorder: Secondary | ICD-10-CM

## 2015-08-20 MED ORDER — IMIPRAMINE HCL 50 MG PO TABS: 50.0000 mg | ORAL_TABLET | Freq: Every day | ORAL | Status: DC

## 2015-08-20 NOTE — Progress Notes (Signed)
Patient ID: Stanley Levy, male   DOB: 08-14-1955, 60 y.o.   MRN: [id number] Patient ID: Stanley Levy, male   DOB: 17-Jul-1955, 60 y.o.   MRN: [id number] Patient ID: Stanley Levy, male   DOB: December 23, 1955, 60 y.o.   MRN: [id number] Patient ID: Stanley Levy, male   DOB: 1955/11/15, 60 y.o.   MRN: [id number] Patient ID: Stanley Levy, male   DOB: 10-12-1955, 60 y.o.   MRN: [id number] Patient ID: Stanley Levy, male   DOB: July 25, 1955, 60 y.o.   MRN: [id number] [facility] [id number] Progress Note Stanley Levy MRN: [id number] DOB: 05-16-1956 Age: 60 y.o.  Date: 08/20/2015 Start Time: 2:30 PM End Time: 3:40 PM  Chief Complaint: Chief Complaint  Patient presents with  . Depression  . Anxiety  . Follow-up   Subjective: "I'm doing well".  This patient is a 60 year old married white male who lives with his wife in [place]. They have no children. 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  The patient states about 15 years ago he became very anxious after his mother died. He began having recurrent panic attacks. His family doctor referred him to a psychiatrist in [place] who started him on imipramine and Xanax. After a while he no longer needed the Xanax but the imipramine has kept the panic attacks at bay. His mood is good and he is very active at work and he also goes to the gym to lift weights. He takes very good care of his health. He only has elevated cholesterol and mild hypothyroidism for his health problems..  The patient returns after 6 months. He is doing very well. His mood is been stable and he said no further panic attacks. 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 He has no current complaints or new health problems. He is planning to work another 5 or 6 years on his current job  Current psychiatric medication Imipramine 50 mg at bedtime   Vitals: BP 139/83 mmHg  Pulse 93  Ht 5\' 8"  (1.727 m)  Wt 197 lb 6.4 oz (89.54  kg)  BMI 30.02 kg/m2  SpO2 95%  Past psychiatric history Patient has been seeing in this office since 2002.  He denies any history of psychiatric inpatient treatment or any suicidal attempt.  He is a long history of anxiety disorder.   Allergies: No Known Allergies Medical History: Past Medical History  Diagnosis Date  . Hyperlipidemia   . Anxiety   . Thyroid disease   . Diabetes mellitus, type II (HCC)   Patient has history of hypothyroidism and recently he was diagnosed with hyperlipidemia.  He has one reading of high hemoglobin A1c however he was never recommended by his primary care physician to start over hyperglycemic medication.  His primary care physician is Dr. Nelva Bush at [facility] physician.    Surgical History: Past Surgical History  Procedure Laterality Date  . Foot surgery     Family History: family history is negative for Anxiety disorder, Dementia, Alcohol abuse, Drug abuse, ADD / ADHD, Bipolar disorder, Depression, OCD, Paranoid behavior, Physical abuse, Schizophrenia, Seizures, and Sexual abuse. Reviewed and nothing is new today.  Social history Patient has been married for 22 years.  He has no children.  He lives with his wife.  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  He likes his job.  Mental status emanation Patient is casually  dressed and fairly groomed. He is t calm and cooperative. His speech is  clear and coherent.  He described his mood is good and his affect is mood congruent.  He denies any auditory hallucinations suicidal thoughts or homicidal thoughts.  He denies any active or passive suicidal thoughts.  There no psychotic symptoms. His attention and concentration is fair.  He is alert and oriented x3. His insight judgment and pulse control is okay.  Lab Results: No results found for this or any previous visit (from the past 8736 hour(s)). PCP draws periodic labs with no concerning trends noted.  Assessment Axis I Anxiety disorder  NOS Axis II deferred Axis III hypothyroidism and hyperlipidemia. Axis IV mild to moderate  Plan/Discussion: I took his vitals.  I reviewed CC, tobacco/med/surg Hx, meds effects/ side effects, problem list, therapies and responses as well as current situation/symptoms discussed options. Continue imipramine for depression and anxiety. He will return in 6 months See orders and pt instructions for more details. MEDICATIONS this encounter: Meds ordered this encounter  Medications  . imipramine (TOFRANIL) 50 MG tablet    Sig: Take 1 tablet (50 mg total) by mouth at bedtime.    Dispense:  30 tablet    Refill:  5   Medical Decision Making Problem Points:  Established problem, stable/improving (1), Review of last therapy session (1) and Review of psycho-social stressors (1) Data Points:  Review or order clinical lab tests (1) Review of medication regiment & side effects (2)  I certify that outpatient services furnished can reasonably be expected to improve the patient's condition.   [name], MD

## 2015-12-05 ENCOUNTER — Telehealth (HOSPITAL_COMMUNITY): Payer: Self-pay | Admitting: *Deleted

## 2015-12-05 NOTE — Telephone Encounter (Signed)
left voice message, appointment has been canceled for 02/15/16.  please call to reschedule.

## 2016-02-14 ENCOUNTER — Encounter (HOSPITAL_COMMUNITY): Payer: Self-pay | Admitting: Psychiatry

## 2016-02-14 ENCOUNTER — Ambulatory Visit (INDEPENDENT_AMBULATORY_CARE_PROVIDER_SITE_OTHER): Payer: BLUE CROSS/BLUE SHIELD | Admitting: Psychiatry

## 2016-02-14 VITALS — BP 136/92 | HR 76 | Ht 68.0 in | Wt 195.6 lb

## 2016-02-14 DIAGNOSIS — F411 Generalized anxiety disorder: Secondary | ICD-10-CM | POA: Diagnosis not present

## 2016-02-14 MED ORDER — IMIPRAMINE HCL 50 MG PO TABS: 50.0000 mg | ORAL_TABLET | Freq: Every day | ORAL | 5 refills | Status: DC

## 2016-02-14 NOTE — Progress Notes (Signed)
Patient ID: Stanley HausenMichael W Levy, male   DOB: 10/31/1955, 60 y.o.   MRN: [id number] Patient ID: Stanley HausenMichael W Levy, male   DOB: 08/23/1955, 60 y.o.   MRN: [id number] Patient ID: Stanley HausenMichael W Levy, male   DOB: 06/19/1956, 60 y.o.   MRN: [id number] Patient ID: Stanley HausenMichael W Levy, male   DOB: 09/26/1955, 60 y.o.   MRN: [id number] Patient ID: Stanley HausenMichael W Levy, male   DOB: 03/25/1956, 60 y.o.   MRN: [id number] Patient ID: Stanley HausenMichael W Levy, male   DOB: 09/06/1955, 60 y.o.   MRN: [id number] [facility] [id number] Progress Note Stanley HausenMichael W Levy MRN: [id number] DOB: 04/24/1956 Age: 60 y.o.  Date: 02/14/2016   Chief Complaint: Chief Complaint  Patient presents with  . Depression  . Anxiety  . Follow-up   Subjective: "I'm doing well".  This patient is a 60 year old married white male who lives with his wife in [place]. They have no children. He works for [employer] as a Designer, television/film setloader.  The patient states about 15 years ago he became very anxious after his mother died. He began having recurrent panic attacks. His family doctor referred him to a psychiatrist in [place] who started him on imipramine and Xanax. After a while he no longer needed the Xanax but the imipramine has kept the panic attacks at bay. His mood is good and he is very active at work and he also goes to the gym to lift weights. He takes very good care of his health. He only has elevated cholesterol and mild hypothyroidism for his health problems..  The patient returns after 6 months. He is doing very well. His mood is been stable and he said no further panic attacks. 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 He has no current complaints or new health problemsHowever he has stopped the Synthroid metformin and simvastatin on his own. He's not been back to his family doctor any year. I told him he needs to check on this particularly his thyroid function he agrees to call his primary care physician. He is  planning to work another 5 or 6 years on his current job  Current psychiatric medication Imipramine 50 mg at bedtime   Vitals: BP (!) 136/92 (BP Location: Right Arm, Patient Position: Sitting, Cuff Size: Large)   Pulse 76   Ht 5\' 8"  (1.727 m)   Wt 195 lb 9.6 oz (88.7 kg)   SpO2 98%   BMI 29.74 kg/m   Past psychiatric history Patient has been seeing in this office since 2002.  He denies any history of psychiatric inpatient treatment or any suicidal attempt.  He is a long history of anxiety disorder.   Allergies: No Known Allergies Medical History: Past Medical History:  Diagnosis Date  . Anxiety   . Diabetes mellitus, type II (HCC)   . Hyperlipidemia   . Thyroid disease   Patient has history of hypothyroidism and recently he was diagnosed with hyperlipidemia.  He has one reading of high hemoglobin A1c however he was never recommended by his primary care physician to start over hyperglycemic medication.  His primary care physician is [facility] at [facility].    Surgical History: Past Surgical History:  Procedure Laterality Date  . FOOT SURGERY     Family History: family history is not on file. Reviewed and nothing is new today.  Social history Patient has been married for 22 years.  He has no children.  He lives with his wife.  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  He likes his job.  Mental status emanation Patient is casually dressed and fairly groomed. He is t calm and cooperative. His speech is  clear and coherent.  He described his mood is good and his affect is mood congruent.  He denies any auditory hallucinations suicidal thoughts or homicidal thoughts.  He denies any active or passive suicidal thoughts.  There no psychotic symptoms. His attention and concentration is fair.  He is alert and oriented x3. His insight judgment and pulse control is okay.  Lab Results: No results found for this or any previous visit (from the past 8736  hour(s)). PCP draws periodic labs with no concerning trends noted.  Assessment Axis I Anxiety disorder NOS Axis II deferred Axis III hypothyroidism and hyperlipidemia. Axis IV mild to moderate  Plan/Discussion: I took his vitals.  I reviewed CC, tobacco/med/surg Hx, meds effects/ side effects, problem list, therapies and responses as well as current situation/symptoms discussed options. Continue imipramine for depression and anxiety. He will return in 6 months See orders and pt instructions for more details. MEDICATIONS this encounter: Meds ordered this encounter  Medications  . imipramine (TOFRANIL) 50 MG tablet    Sig: Take 1 tablet (50 mg total) by mouth at bedtime.    Dispense:  30 tablet    Refill:  5   Medical Decision Making Problem Points:  Established problem, stable/improving (1), Review of last therapy session (1) and Review of psycho-social stressors (1) Data Points:  Review or order clinical lab tests (1) Review of medication regiment & side effects (2)  I certify that outpatient services furnished can reasonably be expected to improve the patient's condition.   [name], MDPatient ID: Stanley Levy, male   DOB: [birth year], 60 y.o.   MRN: [id number]

## 2016-02-15 ENCOUNTER — Ambulatory Visit (HOSPITAL_COMMUNITY): Payer: Self-pay | Admitting: Psychiatry

## 2016-08-14 ENCOUNTER — Ambulatory Visit (INDEPENDENT_AMBULATORY_CARE_PROVIDER_SITE_OTHER): Payer: No Typology Code available for payment source | Admitting: Psychiatry

## 2016-08-14 ENCOUNTER — Encounter (HOSPITAL_COMMUNITY): Payer: Self-pay | Admitting: Psychiatry

## 2016-08-14 VITALS — BP 136/80 | HR 83 | Ht 68.0 in | Wt 205.2 lb

## 2016-08-14 DIAGNOSIS — F411 Generalized anxiety disorder: Secondary | ICD-10-CM | POA: Diagnosis not present

## 2016-08-14 MED ORDER — IMIPRAMINE HCL 50 MG PO TABS: 50.0000 mg | ORAL_TABLET | Freq: Every day | ORAL | 5 refills | Status: DC

## 2016-08-14 NOTE — Progress Notes (Signed)
Patient ID: Stanley Levy, male   DOB: December 17, 1955, 61 y.o.   MRN: [id number] Patient ID: Stanley Levy, male   DOB: 03/03/1956, 61 y.o.   MRN: [id number] Patient ID: Stanley Levy, male   DOB: 1955-10-06, 61 y.o.   MRN: [id number] Patient ID: Stanley Levy, male   DOB: 29-Jun-1956, 61 y.o.   MRN: [id number] Patient ID: Stanley Levy, male   DOB: [birth year], 61 y.o.   MRN: [id number] Patient ID: Stanley Levy, male   DOB: September 24, 1955, 61 y.o.   MRN: [id number] [facility] [id number] Progress Note Stanley Levy MRN: [id number] DOB: Jan 19, 1956 Age: 61 y.o.  Date: 08/14/2016   Chief Complaint: No chief complaint on file.  Subjective: "I'm doing well".  This patient is a 61 year old married white male who lives with his wife in [place]. They have no children. 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  The patient states about 15 years ago he became very anxious after his mother died. He began having recurrent panic attacks. His family doctor referred him to a psychiatrist in [place] who started him on imipramine and Xanax. After a while he no longer needed the Xanax but the imipramine has kept the panic attacks at bay. His mood is good and he is very active at work and he also goes to the gym to lift weights. He takes very good care of his health. He only has elevated cholesterol and mild hypothyroidism for his health problems..  The patient returns after 6 months. He is doing very well. His mood is been stable and he said no further panic attacks. 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 He has no current complaints or new health problems. He still has not gotten a primary care doctor and is not taking his simvastatin metformin or Synthroid. In that there is no way to tell if some of these things are out of whack without getting laboratory testing. I told him that he must get a primary physician before he comes back to see me in 6  months  Current psychiatric medication Imipramine 50 mg at bedtime   Vitals: BP 136/80 (BP Location: Right Arm, Patient Position: Sitting, Cuff Size: Large)   Pulse 83   Ht 5\' 8"  (1.727 m)   Wt 205 lb 3.2 oz (93.1 kg)   BMI 31.20 kg/m   Past psychiatric history Patient has been seeing in this office since 2002.  He denies any history of psychiatric inpatient treatment or any suicidal attempt.  He is a long history of anxiety disorder.   Allergies: No Known Allergies Medical History: Past Medical History:  Diagnosis Date  . Anxiety   . Diabetes mellitus, type II (HCC)   . Hyperlipidemia   . Thyroid disease   Patient has history of hypothyroidism and recently he was diagnosed with hyperlipidemia.  He has one reading of high hemoglobin A1c however he was never recommended by his primary care physician to start over hyperglycemic medication.  His primary care physician is Dr. Nelva Bush at [facility] physician.    Surgical History: Past Surgical History:  Procedure Laterality Date  . FOOT SURGERY     Family History: family history is not on file. Reviewed and nothing is new today.  Social history Patient has been married for 22 years.  He has no children.  He lives with his wife.  He is working in [employer] home [employer]  for past 10 years.  He likes his job.  Mental status emanation Patient is casually dressed and fairly groomed. He is  calm and cooperative. His speech is  clear and coherent.  He described his mood is good and his affect is mood congruent.  He denies any auditory hallucinations suicidal thoughts or homicidal thoughts.  He denies any active or passive suicidal thoughts.  There no psychotic symptoms. His attention and concentration is fair.  He is alert and oriented x3. His insight judgment and pulse control is okay.  Lab Results: No results found for this or any previous visit (from the past 8736 hour(s)). PCP draws periodic labs with no concerning trends  noted.  Assessment Axis I Anxiety disorder NOS Axis II deferred Axis III hypothyroidism and hyperlipidemia. Axis IV mild to moderate  Plan/Discussion: I took his vitals.  I reviewed CC, tobacco/med/surg Hx, meds effects/ side effects, problem list, therapies and responses as well as current situation/symptoms discussed options. Continue imipramine for depression and anxiety. He will return in 6 months See orders and pt instructions for more details. MEDICATIONS this encounter: Meds ordered this encounter  Medications  . imipramine (TOFRANIL) 50 MG tablet    Sig: Take 1 tablet (50 mg total) by mouth at bedtime.    Dispense:  30 tablet    Refill:  5   Medical Decision Making Problem Points:  Established problem, stable/improving (1), Review of last therapy session (1) and Review of psycho-social stressors (1) Data Points:  Review or order clinical lab tests (1) Review of medication regiment & side effects (2)  I certify that outpatient services furnished can reasonably be expected to improve the patient's condition.   [name], MDPatient ID: Stanley Levy, male   DOB: [birth year], 61 y.o.   MRN: [id number]

## 2017-02-11 ENCOUNTER — Telehealth (HOSPITAL_COMMUNITY): Payer: Self-pay | Admitting: *Deleted

## 2017-02-11 ENCOUNTER — Ambulatory Visit (HOSPITAL_COMMUNITY): Payer: BLUE CROSS/BLUE SHIELD | Admitting: Psychiatry

## 2017-02-11 NOTE — Telephone Encounter (Signed)
Called pt to resch due to provider being out of office. Unable to reach pt and staff lmtcb on pt voicemail.  

## 2017-02-11 NOTE — Telephone Encounter (Signed)
left voice message, provider out of office. 

## 2017-02-18 ENCOUNTER — Ambulatory Visit (INDEPENDENT_AMBULATORY_CARE_PROVIDER_SITE_OTHER): Payer: No Typology Code available for payment source | Admitting: Psychiatry

## 2017-02-18 ENCOUNTER — Encounter (HOSPITAL_COMMUNITY): Payer: Self-pay | Admitting: Psychiatry

## 2017-02-18 VITALS — BP 115/83 | HR 92 | Ht 68.0 in | Wt 197.0 lb

## 2017-02-18 DIAGNOSIS — Z634 Disappearance and death of family member: Secondary | ICD-10-CM

## 2017-02-18 DIAGNOSIS — E785 Hyperlipidemia, unspecified: Secondary | ICD-10-CM

## 2017-02-18 DIAGNOSIS — E039 Hypothyroidism, unspecified: Secondary | ICD-10-CM

## 2017-02-18 DIAGNOSIS — F411 Generalized anxiety disorder: Secondary | ICD-10-CM | POA: Diagnosis not present

## 2017-02-18 MED ORDER — IMIPRAMINE HCL 50 MG PO TABS: 50.0000 mg | ORAL_TABLET | Freq: Every day | ORAL | 5 refills | Status: DC

## 2017-02-18 NOTE — Progress Notes (Signed)
Patient ID: Stanley Levy, male   DOB: 1956/06/18, 61 y.o.   MRN: [id number] Patient ID: Stanley Levy, male   DOB: 08-11-1955, 61 y.o.   MRN: [id number] Patient ID: Stanley Levy, male   DOB: 08/20/1955, 61 y.o.   MRN: [id number] Patient ID: Stanley Levy, male   DOB: [birth year], 61 y.o.   MRN: [id number] Patient ID: Stanley Levy, male   DOB: Aug 18, 1955, 61 y.o.   MRN: [id number] Patient ID: Stanley Levy, male   DOB: 1956/05/06, 61 y.o.   MRN: [id number] [facility] [id number] Progress Note Stanley Levy MRN: [id number] DOB: 06-Apr-1956 Age: 61 y.o.  Date: 02/18/2017   Chief Complaint: Chief Complaint  Patient presents with  . Depression  . Anxiety  . Follow-up   Subjective: "I'm doing well".  This patient is a 61 year old married white male who lives with his wife in [place]. They have no children. 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  The patient states about 15 years ago he became very anxious after his mother died. He began having recurrent panic attacks. His family doctor referred him to a psychiatrist in [place] who started him on imipramine and Xanax. After a while he no longer needed the Xanax but the imipramine has kept the panic attacks at bay. His mood is good and he is very active at work and he also goes to the gym to lift weights. He takes very good care of his health. He only has elevated cholesterol and mild hypothyroidism for his health problems..  The patient returns after 6 months. He is doing very well. His mood is been stable and he said no further panic attacks. 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 He has no current complaints or new health problems. He still has not gotten a primary care doctor .I admonished him today and stated he needed to get one as soon as possible since he has a history of hypothyroidism. He checks his blood sugar at home and are normal and his blood pressure here today is  good. He denies any side effects from medication and he is sleeping well  Current psychiatric medication Imipramine 50 mg at bedtime   Vitals: BP 115/83 (BP Location: Right Arm, Patient Position: Sitting, Cuff Size: Normal)   Pulse 92   Ht 5\' 8"  (1.727 m)   Wt 197 lb (89.4 kg)   BMI 29.95 kg/m   Past psychiatric history Patient has been seeing in this office since 2002.  He denies any history of psychiatric inpatient treatment or any suicidal attempt.  He is a long history of anxiety disorder.   Allergies: No Known Allergies Medical History: Past Medical History:  Diagnosis Date  . Anxiety   . Diabetes mellitus, type II (HCC)   . Hyperlipidemia   . Thyroid disease   Patient has history of hypothyroidism and recently he was diagnosed with hyperlipidemia.  He has one reading of high hemoglobin A1c however he was never recommended by his primary care physician to start over hyperglycemic medication.  His primary care physician is Dr. Nelva Bush at [facility] physician.    Surgical History: Past Surgical History:  Procedure Laterality Date  . FOOT SURGERY     Family History: family history is not on file. Reviewed and nothing is new today.  Social history Patient has been married for 22 years.  He has no children.  He lives  with his wife.  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  He likes his job.  Mental status emanation Patient is casually dressed and fairly groomed. He is  calm and cooperative. His speech is  clear and coherent.  He described his mood is good and his affect is mood congruent.  He denies any auditory hallucinations suicidal thoughts or homicidal thoughts.  He denies any active or passive suicidal thoughts.  There no psychotic symptoms. His attention and concentration is fair.  He is alert and oriented x3. His insight judgment and pulse control is okay.  Lab Results: No results found for this or any previous visit (from the past 8736 hour(s)). PCP  draws periodic labs with no concerning trends noted.  Assessment Axis I Anxiety disorder NOS Axis II deferred Axis III hypothyroidism and hyperlipidemia. Axis IV mild to moderate  Plan/Discussion: I took his vitals.  I reviewed CC, tobacco/med/surg Hx, meds effects/ side effects, problem list, therapies and responses as well as current situation/symptoms discussed options. Continue imipramine for depression and anxiety. He will return in 6 months See orders and pt instructions for more details. MEDICATIONS this encounter: Meds ordered this encounter  Medications  . imipramine (TOFRANIL) 50 MG tablet    Sig: Take 1 tablet (50 mg total) by mouth at bedtime.    Dispense:  30 tablet    Refill:  5   Medical Decision Making Problem Points:  Established problem, stable/improving (1), Review of last therapy session (1) and Review of psycho-social stressors (1) Data Points:  Review or order clinical lab tests (1) Review of medication regiment & side effects (2)  I certify that outpatient services furnished can reasonably be expected to improve the patient's condition.   Stanley Levy, MDPatient ID: Stanley Levy, male   DOB: Oct 07, 1955, 61 y.o.   MRN: [id number]

## 2017-07-01 ENCOUNTER — Other Ambulatory Visit (HOSPITAL_COMMUNITY): Payer: Self-pay | Admitting: Psychiatry

## 2017-07-01 DIAGNOSIS — F411 Generalized anxiety disorder: Secondary | ICD-10-CM

## 2017-07-01 MED ORDER — IMIPRAMINE HCL 50 MG PO TABS: 50.0000 mg | ORAL_TABLET | Freq: Every day | ORAL | 3 refills | Status: DC

## 2017-08-20 ENCOUNTER — Ambulatory Visit (HOSPITAL_COMMUNITY): Payer: BLUE CROSS/BLUE SHIELD | Admitting: Psychiatry

## 2017-09-01 ENCOUNTER — Encounter (HOSPITAL_COMMUNITY): Payer: Self-pay | Admitting: Psychiatry

## 2017-09-01 ENCOUNTER — Ambulatory Visit (HOSPITAL_COMMUNITY): Payer: No Typology Code available for payment source | Admitting: Psychiatry

## 2017-09-01 DIAGNOSIS — F411 Generalized anxiety disorder: Secondary | ICD-10-CM

## 2017-09-01 MED ORDER — IMIPRAMINE HCL 50 MG PO TABS: 50.0000 mg | ORAL_TABLET | Freq: Every day | ORAL | 3 refills | Status: DC

## 2017-09-01 NOTE — Progress Notes (Signed)
BH MD/PA/NP OP Progress Note  09/01/2017 3:13 PM Stanley Levy  MRN:  [id number]  Chief Complaint:  Chief Complaint    Depression; Follow-up     HPI: This patient is a 62 year old married white male who lives with his wife in [place]. They have no children. 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  The patient states about 15 years ago he became very anxious after his mother died. He began having recurrent panic attacks. His family doctor referred him to a psychiatrist in [place] who started him on imipramine and Xanax. After a while he no longer needed the Xanax but the imipramine has kept the panic attacks at bay. His mood is good and he is very active at work and he also goes to the gym to lift weights. He takes very good care of his health. He only has elevated cholesterol and mild hypothyroidism for his health problems..  She returns after 6 months.  He is doing very well.  He continues to work at [employer] and he does hard physical labor.  He also works out in a gym.  He is not been to a primary doctor in quite some time for checkup and I suggested he do so.  His mood is good and he has no depression or panic symptoms at present Visit Diagnosis:    ICD-10-CM   1. Generalized anxiety disorder F41.1 imipramine (TOFRANIL) 50 MG tablet    Past Psychiatric History: Long-term outpatient treatment for anxiety  Past Medical History:  Past Medical History:  Diagnosis Date  . Anxiety   . Diabetes mellitus, type II (HCC)   . Hyperlipidemia   . Thyroid disease     Past Surgical History:  Procedure Laterality Date  . FOOT SURGERY      Family Psychiatric History: none  Family History:  Family History  Problem Relation Age of Onset  . Anxiety disorder Neg Hx   . Dementia Neg Hx   . Alcohol abuse Neg Hx   . Drug abuse Neg Hx   . ADD / ADHD Neg Hx   . Bipolar disorder Neg Hx   . Depression Neg Hx   . OCD Neg Hx   . Paranoid behavior Neg Hx   . Physical abuse Neg Hx    . Schizophrenia Neg Hx   . Seizures Neg Hx   . Sexual abuse Neg Hx     Social History:  Social History   Socioeconomic History  . Marital status: Married    Spouse name: None  . Number of children: None  . Years of education: None  . Highest education level: None  Social Needs  . Financial resource strain: None  . Food insecurity - worry: None  . Food insecurity - inability: None  . Transportation needs - medical: None  . Transportation needs - non-medical: None  Occupational History  . None  Tobacco Use  . Smoking status: Never Smoker  . Smokeless tobacco: Never Used  Substance and Sexual Activity  . Alcohol use: No  . Drug use: No  . Sexual activity: Yes  Other Topics Concern  . None  Social History Narrative  . None    Allergies: No Known Allergies  Metabolic Disorder Labs: Lab Results  Component Value Date   HGBA1C 6.7 (H) 05/29/2011   MPG 146 (H) 05/29/2011   No results found for: PROLACTIN No results found for: CHOL, TRIG, HDL, CHOLHDL, VLDL, LDLCALC No results found for: TSH  Therapeutic Level Labs:  No results found for: LITHIUM No results found for: VALPROATE No components found for:  CBMZ  Current Medications: Current Outpatient Medications  Medication Sig Dispense Refill  . imipramine (TOFRANIL) 50 MG tablet Take 1 tablet (50 mg total) by mouth at bedtime. 90 tablet 3   No current facility-administered medications for this visit.      Musculoskeletal: Strength & Muscle Tone: within normal limits Gait & Station: normal Patient leans: N/A  Psychiatric Specialty Exam: Review of Systems  All other systems reviewed and are negative.   Blood pressure 117/79, pulse 90, height 5\' 8"  (1.727 m), weight 200 lb (90.7 kg), SpO2 98 %.Body mass index is 30.41 kg/m.  General Appearance: Casual and Fairly Groomed  Eye Contact:  Good  Speech:  Clear and Coherent  Volume:  Normal  Mood:  Euthymic  Affect:  Congruent  Thought Process:  Goal  Directed  Orientation:  Full (Time, Place, and Person)  Thought Content: WDL   Suicidal Thoughts:  No  Homicidal Thoughts:  No  Memory:  Immediate;   Good Recent;   Good Remote;   Good  Judgement:  Good  Insight:  Fair  Psychomotor Activity:  Normal  Concentration:  Concentration: Good and Attention Span: Good  Recall:  Good  Fund of Knowledge: Good  Language: Good  Akathisia:  No  Handed:  Right  AIMS (if indicated): not done  Assets:  Communication Skills Desire for Improvement Physical Health Resilience Social Support Talents/Skills  ADL's:  Intact  Cognition: WNL  Sleep:  Good   Screenings:   Assessment and Plan: Patient is a 62 year old male with a history of anxiety.  He is doing well on his current medication.  He will continue imipramine 50 mg at bedtime and return to see me in 6 months   [name], MD 09/01/2017, 3:13 PM

## 2018-03-04 ENCOUNTER — Ambulatory Visit (HOSPITAL_COMMUNITY): Payer: Self-pay | Admitting: Psychiatry

## 2018-11-05 ENCOUNTER — Other Ambulatory Visit (HOSPITAL_COMMUNITY): Payer: Self-pay | Admitting: Psychiatry

## 2018-11-05 DIAGNOSIS — F411 Generalized anxiety disorder: Secondary | ICD-10-CM

## 2018-11-29 ENCOUNTER — Telehealth (HOSPITAL_COMMUNITY): Payer: Self-pay | Admitting: *Deleted

## 2018-11-29 NOTE — Telephone Encounter (Signed)
Scheduling appt 

## 2018-12-01 ENCOUNTER — Other Ambulatory Visit: Payer: Self-pay

## 2018-12-01 ENCOUNTER — Encounter (HOSPITAL_COMMUNITY): Payer: Self-pay | Admitting: Psychiatry

## 2018-12-01 ENCOUNTER — Ambulatory Visit (INDEPENDENT_AMBULATORY_CARE_PROVIDER_SITE_OTHER): Payer: No Typology Code available for payment source | Admitting: Psychiatry

## 2018-12-01 DIAGNOSIS — F411 Generalized anxiety disorder: Secondary | ICD-10-CM | POA: Diagnosis not present

## 2018-12-01 MED ORDER — IMIPRAMINE HCL 50 MG PO TABS: 50.0000 mg | ORAL_TABLET | Freq: Every day | ORAL | 3 refills | Status: DC

## 2018-12-01 NOTE — Progress Notes (Signed)
Virtual Visit via Telephone Note  I connected with Stanley Levy on 12/01/18 at  1:20 PM EDT by telephone and verified that I am speaking with the correct person using two identifiers.   I discussed the limitations, risks, security and privacy concerns of performing an evaluation and management service by telephone and the availability of in person appointments. I also discussed with the patient that there may be a patient responsible charge related to this service. The patient expressed understanding and agreed to proceed.      I discussed the assessment and treatment plan with the patient. The patient was provided an opportunity to ask questions and all were answered. The patient agreed with the plan and demonstrated an understanding of the instructions.   The patient was advised to call back or seek an in-person evaluation if the symptoms worsen or if the condition fails to improve as anticipated.  I provided 15 minutes of non-face-to-face time during this encounter.   [name], MD  [facility] MD/PA/NP OP Progress Note  12/01/2018 1:32 PM Stanley Levy  MRN:  [id number]  Chief Complaint:  Chief Complaint    Anxiety; Follow-up     HPI: This patient is a 63 year old married white male who lives with his wife in [place]. They have no children. 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  The patient states about 15 years ago he became very anxious after his mother died. He began having recurrent panic attacks. His family doctor referred him to a psychiatrist in [place] who started him on imipramine and Xanax. After a while he no longer needed the Xanax but the imipramine has kept the panic attacks at bay. His mood is good and he is very active at work and he also goes to the gym to lift weights. He takes very good care of his health. He only has elevated cholesterol and mild hypothyroidism for his health problems..  The patient returns for follow-up after 6 months.  He is  still working full-time as a Designer, television/film set.  He states he is wearing a mask that works and feels generally safe.  He is still working out in a [employer] that is run by church and only allows a few people and so he feels safe there as well.  His health is been excellent and he said no injuries at work.  He states the imipramine continues to help with his anxiety and sleep.  He missed it for about a week and his wife noticed he was more irritable and snappy.  He denies being depressed or anxious.  He denies any panic attacks.  He states that his mood is excellent. Visit Diagnosis:    ICD-10-CM   1. Generalized anxiety disorder F41.1 imipramine (TOFRANIL) 50 MG tablet    Past Psychiatric History: No inpatient admissions, long history of anxiety disorder  Past Medical History:  Past Medical History:  Diagnosis Date  . Anxiety   . Diabetes mellitus, type II (HCC)   . Hyperlipidemia   . Thyroid disease     Past Surgical History:  Procedure Laterality Date  . FOOT SURGERY      Family Psychiatric History: none  Family History:  Family History  Problem Relation Age of Onset  . Anxiety disorder Neg Hx   . Dementia Neg Hx   . Alcohol abuse Neg Hx   . Drug abuse Neg Hx   . ADD / ADHD Neg Hx   . Bipolar disorder Neg Hx   .  Depression Neg Hx   . OCD Neg Hx   . Paranoid behavior Neg Hx   . Physical abuse Neg Hx   . Schizophrenia Neg Hx   . Seizures Neg Hx   . Sexual abuse Neg Hx     Social History:  Social History   Socioeconomic History  . Marital status: Married    Spouse name: Not on file  . Number of children: Not on file  . Years of education: Not on file  . Highest education level: Not on file  Occupational History  . Not on file  Social Needs  . Financial resource strain: Not on file  . Food insecurity:    Worry: Not on file    Inability: Not on file  . Transportation needs:    Medical: Not on file    Non-medical: Not on file  Tobacco Use  . Smoking status: Never Smoker   . Smokeless tobacco: Never Used  Substance and Sexual Activity  . Alcohol use: No  . Drug use: No  . Sexual activity: Yes  Lifestyle  . Physical activity:    Days per week: Not on file    Minutes per session: Not on file  . Stress: Not on file  Relationships  . Social connections:    Talks on phone: Not on file    Gets together: Not on file    Attends religious service: Not on file    Active member of club or organization: Not on file    Attends meetings of clubs or organizations: Not on file    Relationship status: Not on file  Other Topics Concern  . Not on file  Social History Narrative  . Not on file    Allergies: No Known Allergies  Metabolic Disorder Labs: Lab Results  Component Value Date   HGBA1C 6.7 (H) 05/29/2011   MPG 146 (H) 05/29/2011   No results found for: PROLACTIN No results found for: CHOL, TRIG, HDL, CHOLHDL, VLDL, LDLCALC No results found for: TSH  Therapeutic Level Labs: No results found for: LITHIUM No results found for: VALPROATE No components found for:  CBMZ  Current Medications: Current Outpatient Medications  Medication Sig Dispense Refill  . imipramine (TOFRANIL) 50 MG tablet Take 1 tablet (50 mg total) by mouth at bedtime. 90 tablet 3   No current facility-administered medications for this visit.      Musculoskeletal: Strength & Muscle Tone: within normal limits Gait & Station: normal Patient leans: N/A  Psychiatric Specialty Exam: Review of Systems  All other systems reviewed and are negative.   There were no vitals taken for this visit.There is no height or weight on file to calculate BMI.  General Appearance: NA  Eye Contact:  NA  Speech:  Clear and Coherent  Volume:  Normal  Mood:  Euthymic  Affect:  Appropriate and Congruent  Thought Process:  Goal Directed  Orientation:  Full (Time, Place, and Person)  Thought Content: WDL   Suicidal Thoughts:  No  Homicidal Thoughts:  No  Memory:  Immediate;   Good Recent;    Good Remote;   Good  Judgement:  Good  Insight:  Fair  Psychomotor Activity:  Normal  Concentration:  Concentration: Good and Attention Span: Good  Recall:  Good  Fund of Knowledge: Fair  Language: Good  Akathisia:  No  Handed:  Right  AIMS (if indicated): not done  Assets:  Communication Skills Desire for Improvement Physical Health Resilience Social Support Talents/Skills  ADL's:  Intact  Cognition: WNL  Sleep:  Good   Screenings:   Assessment and Plan:   This patient is a 63 year old male with a history of anxiety.  He continues to do well on imipramine 50 mg at bedtime with no side effects.  He will continue this dosage and return to see me in 6 months [name], MD 12/01/2018, 1:32 PM

## 2019-06-02 ENCOUNTER — Other Ambulatory Visit: Payer: Self-pay

## 2019-06-02 ENCOUNTER — Ambulatory Visit (HOSPITAL_COMMUNITY): Payer: No Typology Code available for payment source | Admitting: Psychiatry

## 2019-07-13 ENCOUNTER — Ambulatory Visit
Admission: EM | Admit: 2019-07-13 | Discharge: 2019-07-13 | Disposition: A | Discharge status: Home / Self Care | Payer: Self-pay | Attending: Emergency Medicine | Admitting: Emergency Medicine

## 2019-07-13 ENCOUNTER — Other Ambulatory Visit: Payer: Self-pay

## 2019-07-13 DIAGNOSIS — Z20822 Contact with and (suspected) exposure to covid-19: Secondary | ICD-10-CM

## 2019-07-13 LAB — POCT INFLUENZA A/B
Influenza A, POC: NEGATIVE
Influenza B, POC: NEGATIVE

## 2019-07-13 NOTE — ED Provider Notes (Signed)
[facility]   [id number] 07/13/19 Arrival Time: 1413   CC: COVID symptoms  SUBJECTIVE: History from: patient.  MOMIN MISKO is a 64 y.o. male who presents with improving fatigue, body aches, and feeling unwell x 5 days.  Denies sick exposure to COVID, flu or strep.  Denies recent travel.  Has NOT tried OTC medication.  Denies aggravating factors.  Reports previous symptoms in the past when he was achild.   Denies fever, chills, sinus pain, rhinorrhea, congestion, sore throat, SOB, wheezing, cough, chest pain, nausea, vomiting, changes in bowel or bladder habits.    ROS: As per HPI.  All other pertinent ROS negative.     Past Medical History:  Diagnosis Date  . Anxiety   . Diabetes mellitus, type II (HCC)   . Hyperlipidemia   . Thyroid disease    Past Surgical History:  Procedure Laterality Date  . FOOT SURGERY     No Known Allergies No current facility-administered medications on file prior to encounter.   Current Outpatient Medications on File Prior to Encounter  Medication Sig Dispense Refill  . imipramine (TOFRANIL) 50 MG tablet Take 1 tablet (50 mg total) by mouth at bedtime. 90 tablet 3   Social History   Socioeconomic History  . Marital status: Married    Spouse name: Not on file  . Number of children: Not on file  . Years of education: Not on file  . Highest education level: Not on file  Occupational History  . Not on file  Tobacco Use  . Smoking status: Never Smoker  . Smokeless tobacco: Never Used  Substance and Sexual Activity  . Alcohol use: No  . Drug use: No  . Sexual activity: Yes  Other Topics Concern  . Not on file  Social History Narrative  . Not on file   Social Determinants of Health   Financial Resource Strain:   . Difficulty of Paying Living Expenses: Not on file  Food Insecurity:   . Worried About Programme researcher, broadcasting/film/video in the Last Year: Not on file  . Ran Out of Food in the Last Year: Not on file  Transportation Needs:     . Lack of Transportation (Medical): Not on file  . Lack of Transportation (Non-Medical): Not on file  Physical Activity:   . Days of Exercise per Week: Not on file  . Minutes of Exercise per Session: Not on file  Stress:   . Feeling of Stress : Not on file  Social Connections:   . Frequency of Communication with Friends and Family: Not on file  . Frequency of Social Gatherings with Friends and Family: Not on file  . Attends Religious Services: Not on file  . Active Member of Clubs or Organizations: Not on file  . Attends Banker Meetings: Not on file  . Marital Status: Not on file  Intimate Partner Violence:   . Fear of Current or Ex-Partner: Not on file  . Emotionally Abused: Not on file  . Physically Abused: Not on file  . Sexually Abused: Not on file   Family History  Problem Relation Age of Onset  . Anxiety disorder Neg Hx   . Dementia Neg Hx   . Alcohol abuse Neg Hx   . Drug abuse Neg Hx   . ADD / ADHD Neg Hx   . Bipolar disorder Neg Hx   . Depression Neg Hx   . OCD Neg Hx   . Paranoid behavior Neg Hx   .  Physical abuse Neg Hx   . Schizophrenia Neg Hx   . Seizures Neg Hx   . Sexual abuse Neg Hx     OBJECTIVE:  Vitals:   07/13/19 1424  BP: 120/77  Pulse: (!) 101  Resp: 18  Temp: 98.3 F (36.8 C)  SpO2: 99%     General appearance: alert; appears fatigued, but nontoxic; speaking in full sentences and tolerating own secretions HEENT: NCAT; Ears: EACs clear, TMs pearly gray; Eyes: PERRL.  EOM grossly intact. Nose: nares patent without rhinorrhea, nares erythematous;Throat: oropharynx clear, erythema soft palate, tonsils non erythematous or enlarged, uvula midline  Neck: supple without LAD Lungs: unlabored respirations, symmetrical air entry; cough: absent; no respiratory distress; CTAB Heart: regular rate and rhythm.   Skin: warm and dry Psychological: alert and cooperative; normal mood and affect  LABS:  Results for orders placed or performed  during the hospital encounter of 07/13/19 (from the past 24 hour(s))  POCT Influenza A/B     Status: None   Collection Time: 07/13/19  2:31 PM  Result Value Ref Range   Influenza A, POC Negative Negative   Influenza B, POC Negative Negative     ASSESSMENT & PLAN:  1. Suspected COVID-19 virus infection     No orders of the defined types were placed in this encounter.  Flu test negative COVID testing ordered.  It will take between 5-7 days for test results.  Someone will contact you regarding abnormal results.    In the meantime: You should remain isolated in your home for 10 days from symptom onset AND greater than 72 hours after symptoms resolution (absence of fever without the use of fever-reducing medication and improvement in respiratory symptoms), whichever is longer Get plenty of rest and push fluids Use OTC zyrtec for nasal congestion, runny nose, and/or sore throat Use OTC flonase for nasal congestion and runny nose Use medications daily for symptom relief Use OTC medications like ibuprofen or tylenol as needed fever or pain Call or go to the ED if you have any new or worsening symptoms such as fever, cough, shortness of breath, chest tightness, chest pain, turning blue, changes in mental status, etc...   Reviewed expectations re: course of current medical issues. Questions answered. Outlined signs and symptoms indicating need for more acute intervention. Patient verbalized understanding. After Visit Summary given.         Lestine Box, PA-C 07/13/19 1448

## 2019-07-13 NOTE — Discharge Instructions (Signed)
Flu test negative COVID testing ordered.  It will take between 5-7 days for test results.  Someone will contact you regarding abnormal results.    In the meantime: You should remain isolated in your home for 10 days from symptom onset AND greater than 72 hours after symptoms resolution (absence of fever without the use of fever-reducing medication and improvement in respiratory symptoms), whichever is longer Get plenty of rest and push fluids Use OTC zyrtec for nasal congestion, runny nose, and/or sore throat Use OTC flonase for nasal congestion and runny nose Use medications daily for symptom relief Use OTC medications like ibuprofen or tylenol as needed fever or pain Call or go to the ED if you have any new or worsening symptoms such as fever, cough, shortness of breath, chest tightness, chest pain, turning blue, changes in mental status, etc..Marland Kitchen

## 2019-07-13 NOTE — ED Triage Notes (Signed)
Pt presents with c/o severe fatigue and some body aches and feeling of being unwell, wants flu test

## 2019-07-14 LAB — NOVEL CORONAVIRUS, NAA: SARS-CoV-2, NAA: NOT DETECTED

## 2019-08-01 ENCOUNTER — Encounter (HOSPITAL_COMMUNITY): Payer: Self-pay | Admitting: Emergency Medicine

## 2019-08-01 ENCOUNTER — Other Ambulatory Visit: Payer: Self-pay

## 2019-08-01 ENCOUNTER — Observation Stay (HOSPITAL_COMMUNITY)
Admission: EM | Admit: 2019-08-01 | Discharge: 2019-08-02 | Disposition: A | Discharge status: Home / Self Care | Payer: PPO | Attending: Internal Medicine | Admitting: Internal Medicine

## 2019-08-01 ENCOUNTER — Emergency Department (HOSPITAL_COMMUNITY): Payer: PPO

## 2019-08-01 DIAGNOSIS — R296 Repeated falls: Secondary | ICD-10-CM | POA: Diagnosis not present

## 2019-08-01 DIAGNOSIS — R531 Weakness: Secondary | ICD-10-CM | POA: Insufficient documentation

## 2019-08-01 DIAGNOSIS — R739 Hyperglycemia, unspecified: Secondary | ICD-10-CM

## 2019-08-01 DIAGNOSIS — E1165 Type 2 diabetes mellitus with hyperglycemia: Secondary | ICD-10-CM | POA: Insufficient documentation

## 2019-08-01 DIAGNOSIS — R2681 Unsteadiness on feet: Secondary | ICD-10-CM | POA: Diagnosis not present

## 2019-08-01 DIAGNOSIS — R410 Disorientation, unspecified: Secondary | ICD-10-CM

## 2019-08-01 DIAGNOSIS — Z20822 Contact with and (suspected) exposure to covid-19: Secondary | ICD-10-CM | POA: Insufficient documentation

## 2019-08-01 DIAGNOSIS — Z79899 Other long term (current) drug therapy: Secondary | ICD-10-CM | POA: Diagnosis not present

## 2019-08-01 DIAGNOSIS — Z794 Long term (current) use of insulin: Secondary | ICD-10-CM | POA: Insufficient documentation

## 2019-08-01 DIAGNOSIS — R000 Tachycardia, unspecified: Secondary | ICD-10-CM | POA: Diagnosis not present

## 2019-08-01 DIAGNOSIS — F419 Anxiety disorder, unspecified: Secondary | ICD-10-CM | POA: Diagnosis not present

## 2019-08-01 DIAGNOSIS — R4182 Altered mental status, unspecified: Secondary | ICD-10-CM | POA: Diagnosis not present

## 2019-08-01 LAB — RESPIRATORY PANEL BY RT PCR (FLU A&B, COVID)
Influenza A by PCR: NEGATIVE
Influenza B by PCR: NEGATIVE
SARS Coronavirus 2 by RT PCR: NEGATIVE

## 2019-08-01 LAB — DIFFERENTIAL
Abs Immature Granulocytes: 0.08 10*3/uL — ABNORMAL HIGH (ref 0.00–0.07)
Basophils Absolute: 0 10*3/uL (ref 0.0–0.1)
Basophils Relative: 0 %
Eosinophils Absolute: 0 10*3/uL (ref 0.0–0.5)
Eosinophils Relative: 0 %
Immature Granulocytes: 1 %
Lymphocytes Relative: 21 %
Lymphs Abs: 1.6 10*3/uL (ref 0.7–4.0)
Monocytes Absolute: 0.5 10*3/uL (ref 0.1–1.0)
Monocytes Relative: 6 %
Neutro Abs: 5.2 10*3/uL (ref 1.7–7.7)
Neutrophils Relative %: 72 %

## 2019-08-01 LAB — I-STAT CHEM 8, ED
BUN: 25 mg/dL — ABNORMAL HIGH (ref 8–23)
Calcium, Ion: 1.1 mmol/L — ABNORMAL LOW (ref 1.15–1.40)
Chloride: 99 mmol/L (ref 98–111)
Creatinine, Ser: 1 mg/dL (ref 0.61–1.24)
Glucose, Bld: 584 mg/dL (ref 70–99)
HCT: 49 % (ref 39.0–52.0)
Hemoglobin: 16.7 g/dL (ref 13.0–17.0)
Potassium: 4.3 mmol/L (ref 3.5–5.1)
Sodium: 135 mmol/L (ref 135–145)
TCO2: 22 mmol/L (ref 22–32)

## 2019-08-01 LAB — COMPREHENSIVE METABOLIC PANEL
ALT: 111 U/L — ABNORMAL HIGH (ref 0–44)
AST: 66 U/L — ABNORMAL HIGH (ref 15–41)
Albumin: 3.1 g/dL — ABNORMAL LOW (ref 3.5–5.0)
Alkaline Phosphatase: 470 U/L — ABNORMAL HIGH (ref 38–126)
Anion gap: 13 (ref 5–15)
BUN: 27 mg/dL — ABNORMAL HIGH (ref 8–23)
CO2: 24 mmol/L (ref 22–32)
Calcium: 8.6 mg/dL — ABNORMAL LOW (ref 8.9–10.3)
Chloride: 98 mmol/L (ref 98–111)
Creatinine, Ser: 1.19 mg/dL (ref 0.61–1.24)
GFR calc Af Amer: >60 mL/min (ref >60)
GFR calc non Af Amer: >60 mL/min (ref >60)
Glucose, Bld: 603 mg/dL (ref 70–99)
Potassium: 4.3 mmol/L (ref 3.5–5.1)
Sodium: 135 mmol/L (ref 135–145)
Total Bilirubin: 1.8 mg/dL — ABNORMAL HIGH (ref 0.3–1.2)
Total Protein: 6.3 g/dL — ABNORMAL LOW (ref 6.5–8.1)

## 2019-08-01 LAB — BLOOD GAS, VENOUS
Acid-Base Excess: 2.5 mmol/L — ABNORMAL HIGH (ref 0.0–2.0)
Bicarbonate: 25 mmol/L (ref 20.0–28.0)
FIO2: 21
O2 Saturation: 46.4 %
Patient temperature: 36.5
pCO2, Ven: 44.5 mmHg (ref 44.0–60.0)
pH, Ven: 7.399 (ref 7.250–7.430)
pO2, Ven: <31 mmHg — CL (ref 32.0–45.0)

## 2019-08-01 LAB — CBG MONITORING, ED
Glucose-Capillary: 583 mg/dL (ref 70–99)
Glucose-Capillary: 297 mg/dL — ABNORMAL HIGH (ref 70–99)

## 2019-08-01 LAB — CBC
HCT: 47.9 % (ref 39.0–52.0)
Hemoglobin: 14.9 g/dL (ref 13.0–17.0)
MCH: 31.7 pg (ref 26.0–34.0)
MCHC: 31.1 g/dL (ref 30.0–36.0)
MCV: 101.9 fL — ABNORMAL HIGH (ref 80.0–100.0)
Platelets: 125 10*3/uL — ABNORMAL LOW (ref 150–400)
RBC: 4.7 MIL/uL (ref 4.22–5.81)
RDW: 15.2 % (ref 11.5–15.5)
WBC: 7.4 10*3/uL (ref 4.0–10.5)
nRBC: 0 % (ref 0.0–0.2)

## 2019-08-01 LAB — PROTIME-INR
INR: 1.2 (ref 0.8–1.2)
Prothrombin Time: 14.8 seconds (ref 11.4–15.2)

## 2019-08-01 LAB — LACTIC ACID, PLASMA
Lactic Acid, Venous: 2.3 mmol/L (ref 0.5–1.9)
Lactic Acid, Venous: 1.8 mmol/L (ref 0.5–1.9)

## 2019-08-01 LAB — AMMONIA: Ammonia: 26 umol/L (ref 9–35)

## 2019-08-01 LAB — TSH: TSH: 15.414 u[IU]/mL — ABNORMAL HIGH (ref 0.350–4.500)

## 2019-08-01 LAB — APTT: aPTT: 25 seconds (ref 24–36)

## 2019-08-01 LAB — ETHANOL: Alcohol, Ethyl (B): <10 mg/dL (ref <10)

## 2019-08-01 MED ORDER — SODIUM CHLORIDE 0.9 % IV BOLUS
1000.0000 mL | Freq: Once | INTRAVENOUS | Status: AC
  Administered 2019-08-01: 1000 mL via INTRAVENOUS

## 2019-08-01 MED ORDER — INSULIN ASPART 100 UNIT/ML ~~LOC~~ SOLN
10.0000 [IU] | Freq: Once | SUBCUTANEOUS | Status: AC
  Administered 2019-08-01: 10 [IU] via SUBCUTANEOUS
  Filled 2019-08-01: qty 1

## 2019-08-01 MED ORDER — INSULIN ASPART 100 UNIT/ML ~~LOC~~ SOLN
0.0000 [IU] | SUBCUTANEOUS | Status: DC
  Administered 2019-08-01 – 2019-08-02 (×2): 8 [IU] via SUBCUTANEOUS
  Filled 2019-08-01: qty 1

## 2019-08-01 MED ORDER — ACETAMINOPHEN 325 MG PO TABS
650.0000 mg | ORAL_TABLET | Freq: Four times a day (QID) | ORAL | Status: DC | PRN
  Administered 2019-08-02: 650 mg via ORAL
  Filled 2019-08-01: qty 2

## 2019-08-01 MED ORDER — ENOXAPARIN SODIUM 40 MG/0.4ML ~~LOC~~ SOLN
40.0000 mg | SUBCUTANEOUS | Status: DC
  Filled 2019-08-01: qty 0.4

## 2019-08-01 MED ORDER — IMIPRAMINE HCL 25 MG PO TABS
50.0000 mg | ORAL_TABLET | Freq: Every day | ORAL | Status: DC
  Administered 2019-08-02: 50 mg via ORAL
  Filled 2019-08-01: qty 2

## 2019-08-01 MED ORDER — SODIUM CHLORIDE 0.9 % IV BOLUS
2000.0000 mL | Freq: Once | INTRAVENOUS | Status: AC
  Administered 2019-08-01: 2000 mL via INTRAVENOUS

## 2019-08-01 MED ORDER — SODIUM CHLORIDE 0.9 % IV BOLUS: 1000.0000 mL | Freq: Once | INTRAVENOUS | Status: DC

## 2019-08-01 MED ORDER — SODIUM CHLORIDE 0.9 % IV SOLN: INTRAVENOUS | Status: DC

## 2019-08-01 MED ORDER — POLYETHYLENE GLYCOL 3350 17 G PO PACK: 17.0000 g | PACK | Freq: Every day | ORAL | Status: DC | PRN

## 2019-08-01 MED ORDER — ACETAMINOPHEN 650 MG RE SUPP: 650.0000 mg | Freq: Four times a day (QID) | RECTAL | Status: DC | PRN

## 2019-08-01 NOTE — ED Notes (Signed)
Date and time results received: 08/01/19 6:09 PM  (use smartphrase ".now" to insert current time)  Test: Lactic Acid Critical Value: 2.3  PO2 <31.0  Name of Provider Notified: Rancour  Orders Received? Or Actions Taken?: Orders Received - See Orders for details
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
## 2019-08-01 NOTE — ED Provider Notes (Signed)
[facility] EMERGENCY DEPARTMENT Provider Note   CSN: [id number] Arrival date & time: 08/01/19  1517     History Chief Complaint  Patient presents with  . Altered Mental Status    Stanley Levy is a 64 y.o. male.  Level 5 caveat for altered mental status.  Both patient and his wife are poor historians.  Wife states he has not been normal since January 6.  He has been repeating himself, falling asleep easily, talking repetitively, not making sense at times.  Has been sent home from work multiple times because of frequent falls and memory loss.  He had a fall in January 16 sustaining abrasion to his forehead above his right eye.  He was not evaluated after this fall.  Patient denies any pain.  He is oriented to person and place.  He denies any headache, neck pain, chest pain, abdominal pain, fever, chills, nausea or vomiting. Denies any alcohol or drug use.  Has a history of diabetes and hyperlipidemia. His only medication is imipramine.  He denies any neck or back pain.  No focal weakness, numbness or tingling.  There is some slurred speech and dysarthria which wife states is been intermittent.  The history is provided by the patient and a relative.  Altered Mental Status Presenting symptoms: confusion   Associated symptoms: weakness   Associated symptoms: no abdominal pain, no fever, no nausea and no vomiting        Past Medical History:  Diagnosis Date  . Anxiety   . Diabetes mellitus, type II (HCC)   . Hyperlipidemia   . Thyroid disease     Patient Active Problem List   Diagnosis Date Noted  . Anxiety disorder 11/25/2011    Past Surgical History:  Procedure Laterality Date  . FOOT SURGERY         Family History  Problem Relation Age of Onset  . Anxiety disorder Neg Hx   . Dementia Neg Hx   . Alcohol abuse Neg Hx   . Drug abuse Neg Hx   . ADD / ADHD Neg Hx   . Bipolar disorder Neg Hx   . Depression Neg Hx   . OCD Neg Hx   . Paranoid behavior Neg Hx   .  Physical abuse Neg Hx   . Schizophrenia Neg Hx   . Seizures Neg Hx   . Sexual abuse Neg Hx     Social History   Tobacco Use  . Smoking status: Never Smoker  . Smokeless tobacco: Never Used  Substance Use Topics  . Alcohol use: No  . Drug use: No    Home Medications Prior to Admission medications   Medication Sig Start Date End Date Taking? Authorizing Provider  imipramine (TOFRANIL) 50 MG tablet Take 1 tablet (50 mg total) by mouth at bedtime. 12/01/18   Myrlene Broker, MD    Allergies    Patient has no known allergies.  Review of Systems   Review of Systems  Constitutional: Positive for fatigue. Negative for activity change, appetite change and fever.  HENT: Negative for congestion and rhinorrhea.   Eyes: Negative for visual disturbance.  Respiratory: Negative for cough, chest tightness and shortness of breath.   Gastrointestinal: Negative for abdominal pain, nausea and vomiting.  Genitourinary: Negative for dysuria, hematuria and urgency.  Musculoskeletal: Negative for arthralgias and myalgias.  Neurological: Positive for weakness and numbness. Negative for dizziness.  Psychiatric/Behavioral: Positive for behavioral problems, confusion and decreased concentration.   all other systems are  negative except as noted in the HPI and PMH.    Physical Exam Updated Vital Signs BP (!) 124/55 (BP Location: Right Arm)   Pulse 88   Temp 97.7 F (36.5 C) (Oral)   Resp 18   Ht 5\' 9"  (1.753 m)   Wt 87.5 kg   SpO2 100%   BMI 28.50 kg/m   Physical Exam Vitals and nursing note reviewed.  Constitutional:      General: He is not in acute distress.    Appearance: He is well-developed.     Comments: Patient no distress.  Questionable dysarthria.  HENT:     Head: Normocephalic and atraumatic.     Comments: Abrasion to right forehead and right upper eyelid    Mouth/Throat:     Pharynx: No oropharyngeal exudate.  Eyes:     Conjunctiva/sclera: Conjunctivae normal.     Pupils:  Pupils are equal, round, and reactive to light.  Neck:     Comments: No meningismus. Cardiovascular:     Rate and Rhythm: Normal rate and regular rhythm.     Heart sounds: Normal heart sounds. No murmur.  Pulmonary:     Effort: Pulmonary effort is normal. No respiratory distress.     Breath sounds: Normal breath sounds.  Abdominal:     Palpations: Abdomen is soft.     Tenderness: There is no abdominal tenderness. There is no guarding or rebound.  Musculoskeletal:        General: No tenderness. Normal range of motion.     Cervical back: Normal range of motion and neck supple.  Skin:    General: Skin is warm.  Neurological:     Mental Status: He is alert.     Cranial Nerves: No cranial nerve deficit.     Motor: No abnormal muscle tone.     Coordination: Coordination normal.     Comments: No ataxia on finger to nose bilaterally. No pronator drift. 5/5 strength throughout. CN 2-12 intact.Equal grip strength. Sensation intact.  Romberg negative, normal gait Questionable dysarthric speech.  Tongue is midline. Oriented to person and place  Psychiatric:        Behavior: Behavior normal.     ED Results / Procedures / Treatments   Labs (all labs ordered are listed, but only abnormal results are displayed) Labs Reviewed  CBC - Abnormal; Notable for the following components:      Result Value   MCV 101.9 (*)    Platelets 125 (*)    All other components within normal limits  DIFFERENTIAL - Abnormal; Notable for the following components:   Abs Immature Granulocytes 0.08 (*)    All other components within normal limits  COMPREHENSIVE METABOLIC PANEL - Abnormal; Notable for the following components:   Glucose, Bld 603 (*)    BUN 27 (*)    Calcium 8.6 (*)    Total Protein 6.3 (*)    Albumin 3.1 (*)    AST 66 (*)    ALT 111 (*)    Alkaline Phosphatase 470 (*)    Total Bilirubin 1.8 (*)    All other components within normal limits  TSH - Abnormal; Notable for the following  components:   TSH 15.414 (*)    All other components within normal limits  LACTIC ACID, PLASMA - Abnormal; Notable for the following components:   Lactic Acid, Venous 2.3 (*)    All other components within normal limits  BLOOD GAS, VENOUS - Abnormal; Notable for the following components:   pO2,  Ven <31.0 (*)    Acid-Base Excess 2.5 (*)    All other components within normal limits  I-STAT CHEM 8, ED - Abnormal; Notable for the following components:   BUN 25 (*)    Glucose, Bld 584 (*)    Calcium, Ion 1.10 (*)    All other components within normal limits  CBG MONITORING, ED - Abnormal; Notable for the following components:   Glucose-Capillary 583 (*)    All other components within normal limits  RESPIRATORY PANEL BY RT PCR (FLU A&B, COVID)  ETHANOL  PROTIME-INR  APTT  AMMONIA  LACTIC ACID, PLASMA  RAPID URINE DRUG SCREEN, HOSP PERFORMED  URINALYSIS, ROUTINE W REFLEX MICROSCOPIC    EKG EKG Interpretation  Date/Time:  Monday August 01 2019 16:40:12 EST Ventricular Rate:  101 PR Interval:    QRS Duration: 104 QT Interval:  362 QTC Calculation: 470 R Axis:   80 Text Interpretation: Sinus tachycardia Consider anterolateral infarct Borderline T abnormalities, inferior leads No previous ECGs available Confirmed by Glynn Octave [phone]) on 08/01/2019 5:10:26 PM   Radiology CT HEAD WO CONTRAST  Result Date: 08/01/2019 CLINICAL DATA:  Multiple falls. EXAM: CT HEAD WITHOUT CONTRAST TECHNIQUE: Contiguous axial images were obtained from the base of the skull through the vertex without intravenous contrast. COMPARISON:  None. FINDINGS: Brain: There is moderate severity cerebral atrophy with widening of the extra-axial spaces and ventricular dilatation. There are areas of decreased attenuation within the white matter tracts of the supratentorial brain, consistent with microvascular disease changes. Adjacent 1 mm and 2 mm foci of white matter calcification are seen along the posterior  aspect of the centrum semiovale on the left. Vascular: No hyperdense vessel or unexpected calcification. Skull: Normal. Negative for fracture or focal lesion. Sinuses/Orbits: No acute finding. Other: None. IMPRESSION: 1. Generalized cerebral atrophy. 2. No acute intracranial abnormality. Electronically Signed   By: Aram Candela M.D.   On: 08/01/2019 20:08   DG Chest Portable 1 View  Result Date: 08/01/2019 CLINICAL DATA:  Change in mental status EXAM: PORTABLE CHEST 1 VIEW COMPARISON:  None. FINDINGS: The heart size and mediastinal contours are within normal limits. Both lungs are clear. The visualized skeletal structures are unremarkable. IMPRESSION: No active disease. Electronically Signed   By: Jonna Clark M.D.   On: 08/01/2019 17:46    Procedures .Critical Care Performed by: Glynn Octave, MD Authorized by: Glynn Octave, MD   Critical care provider statement:    Critical care time (minutes):  35   Critical care was necessary to treat or prevent imminent or life-threatening deterioration of the following conditions:  Dehydration and endocrine crisis   Critical care was time spent personally by me on the following activities:  Discussions with consultants, evaluation of patient's response to treatment, examination of patient, ordering and performing treatments and interventions, ordering and review of laboratory studies, ordering and review of radiographic studies, pulse oximetry, re-evaluation of patient's condition, obtaining history from patient or surrogate and review of old charts   (including critical care time)  Medications Ordered in ED Medications - No data to display  ED Course  I have reviewed the triage vital signs and the nursing notes.  Pertinent labs & imaging results that were available during my care of the patient were reviewed by me and considered in my medical decision making (see chart for details).    MDM Rules/Calculators/A&P  Patient here with altered mental status since mid January.Code stroke not activated due to delay in presentation. He has had increased confusion, dysarthria, repetitive statements, difficulty walking and memory loss since then.  Did hit his head on January 16 but symptoms preceded this.  Here he is mildly confused and perhaps underlying cognitive delay.  He is some questionable dysarthria.  He has an abrasion to his scalp.  Last seen normal was several weeks ago.  Code stroke not activated due to delay in presentation.  Found to be hyperglycemic without evidence of DKA.  He is given IV fluids and insulin. CT head shows no hemorrhage or obvious infarct.  Concern his encephalopathy may be related to his hyperglycemia. Has been off of meds for quite some time.  Will admit for MRI in morning, rule out CVA. Hydration and correction of hyperglycemia.  D/w Dr. Luberta Robertson.   CHAROD SLAWINSKI was evaluated in Emergency Department on 08/01/2019 for the symptoms described in the history of present illness. He was evaluated in the context of the global COVID-19 pandemic, which necessitated consideration that the patient might be at risk for infection with the SARS-CoV-2 virus that causes COVID-19. Institutional protocols and algorithms that pertain to the evaluation of patients at risk for COVID-19 are in a state of rapid change based on information released by regulatory bodies including the CDC and federal and state organizations. These policies and algorithms were followed during the patient's care in the ED.  Final Clinical Impression(s) / ED Diagnoses Final diagnoses:  Altered mental status, unspecified altered mental status type  Hyperglycemia    Rx / DC Orders ED Discharge Orders    None       Verlena Marlette, Jeannett Senior, MD 08/02/19 0045

## 2019-08-01 NOTE — ED Notes (Signed)
CRITICAL VALUE ALERT  Critical Value: Chem 8 glucose 584  Date & Time Notied: 08/01/2019   1710  Provider Notified: Dr. Manus Gunning

## 2019-08-01 NOTE — ED Notes (Signed)
Date and time results received: 08/01/19 1726 (use smartphrase ".now" to insert current time)  Test: Glucose Critical Value: 604  Name of Provider Notified: Rancour  Orders Received? Or Actions Taken?: Orders Received - See Orders for details

## 2019-08-01 NOTE — H&P (Signed)
History and Physical:    Stanley Levy   BOF:[id number] DOB: Apr 30, 1956 DOA: 08/01/2019  Referring MD/provider: Dr. Wyvonnia Dusky PCP: Patient, No Pcp Per   Patient coming from: Home  Chief Complaint: Fatigue and possible subacute altered mental status.  History as per ED documentation and patient's own history.  History of Present Illness:   Stanley Levy is an 64 y.o. male with previous history of DM 2 who has been off medications for the past 2 years is brought in by his wife who states that for the past 3 weeks he has been repeating himself, falling asleep easily and talking repetitively and not making sense at times.  He apparently has been sent home from work multiple times because of frequent falls and memory loss.  He was sent home apparently on January 16 after a fall and has not been back to work since.  Patient himself tells me that he was sent home from work on January 16 because "I had slowed down on my job".  He does remember falling once I asked him if he did fall.  Patient states that he has mostly just been very tired and somewhat weak.  He admits that he sometimes confused but not always.  Notes he has not taken diabetes medications for a few years.  Is not sure what he was on before or why he is off of it now.  Patient denies fevers or chills.  No cough or shortness of breath.  No chest pain.  No headache.  No dysuria abdominal pain vomiting or diarrhea.  His appetite is good.  No loss of smell.  ED Course:  The patient was noted to be markedly hyperglycemic although had a normal anion gap.  He was also thought to may be have some slurred speech and dysarthria which patient's wife apparently stated was intermittent.  Head CT was negative for any bleed.  Patient is now admitted for management of his hyperglycemia and possible alteration in mental status.  ROS:   ROS   Review of Systems: As per HPI   Past Medical History:   Past Medical History:  Diagnosis Date  .  Anxiety   . Diabetes mellitus, type II ([place])   . Hyperlipidemia   . Thyroid disease     Past Surgical History:   Past Surgical History:  Procedure Laterality Date  . FOOT SURGERY      Social History:   Social History   Socioeconomic History  . Marital status: Married    Spouse name: Not on file  . Number of children: Not on file  . Years of education: Not on file  . Highest education level: Not on file  Occupational History  . Not on file  Tobacco Use  . Smoking status: Never Smoker  . Smokeless tobacco: Never Used  Substance and Sexual Activity  . Alcohol use: No  . Drug use: No  . Sexual activity: Yes  Other Topics Concern  . Not on file  Social History Narrative  . Not on file   Social Determinants of Health   Financial Resource Strain:   . Difficulty of Paying Living Expenses: Not on file  Food Insecurity:   . Worried About Charity fundraiser in the Last Year: Not on file  . Ran Out of Food in the Last Year: Not on file  Transportation Needs:   . Lack of Transportation (Medical): Not on file  . Lack of Transportation (Non-Medical): Not on file  Physical Activity:   . Days of Exercise per Week: Not on file  . Minutes of Exercise per Session: Not on file  Stress:   . Feeling of Stress : Not on file  Social Connections:   . Frequency of Communication with Friends and Family: Not on file  . Frequency of Social Gatherings with Friends and Family: Not on file  . Attends Religious Services: Not on file  . Active Member of Clubs or Organizations: Not on file  . Attends Banker Meetings: Not on file  . Marital Status: Not on file  Intimate Partner Violence:   . Fear of Current or Ex-Partner: Not on file  . Emotionally Abused: Not on file  . Physically Abused: Not on file  . Sexually Abused: Not on file    Allergies   Patient has no known allergies.  Family history:   Family History  Problem Relation Age of Onset  . Anxiety disorder  Neg Hx   . Dementia Neg Hx   . Alcohol abuse Neg Hx   . Drug abuse Neg Hx   . ADD / ADHD Neg Hx   . Bipolar disorder Neg Hx   . Depression Neg Hx   . OCD Neg Hx   . Paranoid behavior Neg Hx   . Physical abuse Neg Hx   . Schizophrenia Neg Hx   . Seizures Neg Hx   . Sexual abuse Neg Hx     Current Medications:   Prior to Admission medications   Medication Sig Start Date End Date Taking? Authorizing Provider  imipramine (TOFRANIL) 50 MG tablet Take 1 tablet (50 mg total) by mouth at bedtime. 12/01/18  Yes Myrlene Broker, MD    Physical Exam:   Vitals:   08/01/19 2000 08/01/19 2015 08/01/19 2130 08/01/19 2200  BP: 96/63  107/78 111/79  Pulse: 98 98 95 95  Resp: (!) 23 18 (!) 22 18  Temp:      TempSrc:      SpO2: 98% 100% 100% 100%  Weight:      Height:         Physical Exam: Blood pressure 111/79, pulse 95, temperature 97.7 F (36.5 C), temperature source Oral, resp. rate 18, height 5\' 9"  (1.753 m), weight 87.5 kg, SpO2 100 %. Gen: Patient with odd affect and possible cognitive delay lying in bed in no acute distress.  He is able to answer all questions logically although does admit to some lapses in memory and some intermittent confusion. Constitutional: Alert and awake, oriented x3, not in any acute distress. Eyes: sclera anicteric, conjuctiva clear, no lid lag, no exophthalmos, EOMI CVS: S1-S2 clear, no murmur rubs or gallops, no LE edema, normal pedal pulses  Respiratory:  normal effort, symmetrical excursion, CTA without adventitious sounds.  GI: NABS, soft, NT, ND, no palpable masses.  LE: No edema. No cyanosis Neuro: A/O x 3, Moving all extremities equally with normal strength, CN 3-12 intact, grossly nonfocal.  Psych: patient is logical and coherent, judgement and insight appear poor, mood and affect appropriate to situation. Skin: no rashes or lesions or ulcers,    Data Review:    Labs: Basic Metabolic Panel: Recent Labs  Lab 08/01/19 1638 08/01/19 1653   NA 135 135  K 4.3 4.3  CL 98 99  CO2 24  --   GLUCOSE 603* 584*  BUN 27* 25*  CREATININE 1.19 1.00  CALCIUM 8.6*  --    Liver Function Tests: Recent Labs  Lab  08/01/19 1638  AST 66*  ALT 111*  ALKPHOS 470*  BILITOT 1.8*  PROT 6.3*  ALBUMIN 3.1*   No results for input(s): LIPASE, AMYLASE in the last 168 hours. Recent Labs  Lab 08/01/19 1638  AMMONIA 26   CBC: Recent Labs  Lab 08/01/19 1638 08/01/19 1653  WBC 7.4  --   NEUTROABS 5.2  --   HGB 14.9 16.7  HCT 47.9 49.0  MCV 101.9*  --   PLT 125*  --    Cardiac Enzymes: No results for input(s): CKTOTAL, CKMB, CKMBINDEX, TROPONINI in the last 168 hours.  BNP (last 3 results) No results for input(s): PROBNP in the last 8760 hours. CBG: Recent Labs  Lab 08/01/19 1913  GLUCAP 583*    Urinalysis No results found for: COLORURINE, APPEARANCEUR, LABSPEC, PHURINE, GLUCOSEU, HGBUR, BILIRUBINUR, KETONESUR, PROTEINUR, UROBILINOGEN, NITRITE, LEUKOCYTESUR    Radiographic Studies: CT HEAD WO CONTRAST  Result Date: 08/01/2019 CLINICAL DATA:  Multiple falls. EXAM: CT HEAD WITHOUT CONTRAST TECHNIQUE: Contiguous axial images were obtained from the base of the skull through the vertex without intravenous contrast. COMPARISON:  None. FINDINGS: Brain: There is moderate severity cerebral atrophy with widening of the extra-axial spaces and ventricular dilatation. There are areas of decreased attenuation within the white matter tracts of the supratentorial brain, consistent with microvascular disease changes. Adjacent 1 mm and 2 mm foci of white matter calcification are seen along the posterior aspect of the centrum semiovale on the left. Vascular: No hyperdense vessel or unexpected calcification. Skull: Normal. Negative for fracture or focal lesion. Sinuses/Orbits: No acute finding. Other: None. IMPRESSION: 1. Generalized cerebral atrophy. 2. No acute intracranial abnormality. Electronically Signed   By: Aram Candela M.D.   On:  08/01/2019 20:08   DG Chest Portable 1 View  Result Date: 08/01/2019 CLINICAL DATA:  Change in mental status EXAM: PORTABLE CHEST 1 VIEW COMPARISON:  None. FINDINGS: The heart size and mediastinal contours are within normal limits. Both lungs are clear. The visualized skeletal structures are unremarkable. IMPRESSION: No active disease. Electronically Signed   By: Jonna Clark M.D.   On: 08/01/2019 17:46    EKG: Independently reviewed.  Sinus rhythm at 80.  Nonspecific IVCD.  Axis is 80.  Poor R wave progression.  No acute ST-T wave changes.   Assessment/Plan:   Principal Problem:   Hyperglycemia due to diabetes mellitus (HCC) Active Problems:   Weakness   Subacute confusional state  64 year old man with diabetes and anxiety presents with subacute weakness, falls and possible intermittent confusion.  He is noted to be markedly hyperglycemic with normal anion gap  SUBACUTE CONFUSION I suspect this is from his hyperglycemia which he is probably had for quite a while resulting in chronic dehydration and hyperosmolar state. CT shows general cerebral atrophy. Will get an MRI in the morning.  WEAKNESS Again I suspect this is secondary to chronic dehydration from subacute or chronic hyperglycemia. We will hydrate aggressively and reassess in the morning.  HYPERGLYCEMIA We will treat with aggressive fluid resuscitation Start patient on SSI every 4 hours moderate dose insulin Keep patient n.p.o. until sugars are below 250 Can start basal insulin as warranted depending on insulin requirements  ANXIETY Continue imipramine   Other information:   DVT prophylaxis: Enoxaparin ordered. Code Status: Full Family Communication: His wife was with patient earlier and spoke with ED physician Disposition Plan: Home Consults called: None Admission status: Inpatient  Eugine Bubb Tublu Vraj Denardo [facility]  If 7PM-7AM, please contact night-coverage [email or web address] Password [email or web address]  08/01/2019, 10:41 PM

## 2019-08-02 ENCOUNTER — Inpatient Hospital Stay (HOSPITAL_COMMUNITY): Payer: PPO

## 2019-08-02 ENCOUNTER — Encounter (HOSPITAL_COMMUNITY): Payer: Self-pay | Admitting: Internal Medicine

## 2019-08-02 DIAGNOSIS — G9389 Other specified disorders of brain: Secondary | ICD-10-CM | POA: Diagnosis not present

## 2019-08-02 DIAGNOSIS — E1165 Type 2 diabetes mellitus with hyperglycemia: Secondary | ICD-10-CM

## 2019-08-02 LAB — CBC
HCT: 44.2 % (ref 39.0–52.0)
Hemoglobin: 13.9 g/dL (ref 13.0–17.0)
MCH: 31.7 pg (ref 26.0–34.0)
MCHC: 31.4 g/dL (ref 30.0–36.0)
MCV: 100.7 fL — ABNORMAL HIGH (ref 80.0–100.0)
Platelets: 103 10*3/uL — ABNORMAL LOW (ref 150–400)
RBC: 4.39 MIL/uL (ref 4.22–5.81)
RDW: 15 % (ref 11.5–15.5)
WBC: 7.3 10*3/uL (ref 4.0–10.5)
nRBC: 0 % (ref 0.0–0.2)

## 2019-08-02 LAB — BASIC METABOLIC PANEL
Anion gap: 7 (ref 5–15)
BUN: 22 mg/dL (ref 8–23)
CO2: 27 mmol/L (ref 22–32)
Calcium: 8.2 mg/dL — ABNORMAL LOW (ref 8.9–10.3)
Chloride: 109 mmol/L (ref 98–111)
Creatinine, Ser: 0.83 mg/dL (ref 0.61–1.24)
GFR calc Af Amer: >60 mL/min (ref >60)
GFR calc non Af Amer: >60 mL/min (ref >60)
Glucose, Bld: 77 mg/dL (ref 70–99)
Potassium: 3.4 mmol/L — ABNORMAL LOW (ref 3.5–5.1)
Sodium: 143 mmol/L (ref 135–145)

## 2019-08-02 LAB — GLUCOSE, CAPILLARY
Glucose-Capillary: 87 mg/dL (ref 70–99)
Glucose-Capillary: 105 mg/dL — ABNORMAL HIGH (ref 70–99)
Glucose-Capillary: 280 mg/dL — ABNORMAL HIGH (ref 70–99)
Glucose-Capillary: 333 mg/dL — ABNORMAL HIGH (ref 70–99)

## 2019-08-02 LAB — HEMOGLOBIN A1C: Hgb A1c MFr Bld: >18.5 % — ABNORMAL HIGH (ref 4.8–5.6)

## 2019-08-02 MED ORDER — BLOOD GLUCOSE METER KIT: PACK | 0 refills | Status: DC

## 2019-08-02 MED ORDER — INFLUENZA VAC A&B SA ADJ QUAD 0.5 ML IM PRSY: 0.5000 mL | PREFILLED_SYRINGE | INTRAMUSCULAR | Status: DC

## 2019-08-02 MED ORDER — INSULIN PEN NEEDLE 31G X 5 MM MISC: 1.0000 [IU] | Freq: Two times a day (BID) | 5 refills | Status: DC

## 2019-08-02 MED ORDER — GABAPENTIN 100 MG PO CAPS: 100.0000 mg | ORAL_CAPSULE | Freq: Three times a day (TID) | ORAL | 2 refills | Status: DC

## 2019-08-02 MED ORDER — POTASSIUM CHLORIDE CRYS ER 20 MEQ PO TBCR
40.0000 meq | EXTENDED_RELEASE_TABLET | Freq: Once | ORAL | Status: AC
  Administered 2019-08-02: 40 meq via ORAL
  Filled 2019-08-02: qty 2

## 2019-08-02 MED ORDER — NOVOLOG MIX 70/30 FLEXPEN (70-30) 100 UNIT/ML ~~LOC~~ SUPN: 12.0000 [IU] | PEN_INJECTOR | Freq: Two times a day (BID) | SUBCUTANEOUS | 11 refills | Status: DC

## 2019-08-02 NOTE — Evaluation (Signed)
Physical Therapy Evaluation Patient Details Name: Stanley Levy MRN: [id number] DOB: 1955-10-03 Today's Date: 08/02/2019   History of Present Illness  Stanley Levy is an 64 y.o. male with previous history of DM 2 who has been off medications for the past 2 years is brought in by his wife who states that for the past 3 weeks he has been repeating himself, falling asleep easily and talking repetitively and not making sense at times.  He apparently has been sent home from work multiple times because of frequent falls and memory loss.  He was sent home apparently on January 16 after a fall and has not been back to work since. Patient himself tells me that he was sent home from work on January 16 because "I had slowed down on my job".  He does remember falling once I asked him if he did fall.  Patient states that he has mostly just been very tired and somewhat weak.  He admits that he sometimes confused but not always.  Notes he has not taken diabetes medications for a few years.  Is not sure what he was on before or why he is off of it now.     Clinical Impression  Patient functioning near baseline for functional mobility and gait. Patient appears slightly confused with intermittent confusion during today's session. He is able to complete bed mobility and transfers without assist and ambulate without AD. He demonstrates minimally unsteadiness with gait.  Patient will benefit from continued physical therapy in hospital and recommended venue below to increase strength, balance, endurance for safe ADLs and gait.      Follow Up Recommendations No PT follow up;Supervision - Intermittent    Equipment Recommendations  None recommended by PT    Recommendations for Other Services       Precautions / Restrictions Precautions Precautions: Fall Restrictions Weight Bearing Restrictions: No      Mobility  Bed Mobility Overal bed mobility: Modified Independent             General bed mobility  comments: to transition to seated EOB, slightly labored  Transfers Overall transfer level: Needs assistance Equipment used: None Transfers: Sit to/from [employer] Transfers Sit to Stand: Supervision Stand pivot transfers: Supervision       General transfer comment: slightly slower, no unsteadiness upon standing  Ambulation/Gait Ambulation/Gait assistance: Supervision Gait Distance (Feet): 140 Feet Assistive device: None Gait Pattern/deviations: Trunk flexed;Decreased step length - right;Decreased step length - left;Decreased stride length Gait velocity: slightly slower   General Gait Details: Able to ambulate without AD, decreased foot clearance bilateral, near baseline  Stairs            Wheelchair Mobility    Modified Rankin (Stroke Patients Only)       Balance Overall balance assessment: Needs assistance Sitting-balance support: No upper extremity supported;Feet supported Sitting balance-Leahy Scale: Good Sitting balance - Comments: seated EOB   Standing balance support: No upper extremity supported Standing balance-Leahy Scale: Good Standing balance comment: standing without AD                             Pertinent Vitals/Pain Pain Assessment: No/denies pain    Home Living Family/patient expects to be discharged to:: Private residence Living Arrangements: Spouse/significant other Available Help at Discharge: Family Type of Home: House Home Access: Stairs to enter   Technical brewer of Steps: 5 Home Layout: One level Home Equipment: Cane - single point  Additional Comments: Patient appears slightly confused intermittently throughout session    Prior Function Level of Independence: Independent         Comments: 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; his wife sometimes assists him with household chores     Hand Dominance        Extremity/Trunk Assessment   Upper  Extremity Assessment Upper Extremity Assessment: Overall WFL for tasks assessed    Lower Extremity Assessment Lower Extremity Assessment: Overall WFL for tasks assessed    Cervical / Trunk Assessment Cervical / Trunk Assessment: Normal  Communication   Communication: No difficulties  Cognition Arousal/Alertness: Awake/alert Behavior During Therapy: WFL for tasks assessed/performed Overall Cognitive Status: No family/caregiver present to determine baseline cognitive functioning                                 General Comments: Appears confused at points during session      General Comments      Exercises     Assessment/Plan    PT Assessment Patient needs continued PT services  PT Problem List Decreased strength;Decreased activity tolerance;Decreased balance;Decreased mobility;Decreased knowledge of use of DME       PT Treatment Interventions DME instruction;Gait training;Stair training;Functional mobility training;Therapeutic activities;Therapeutic exercise;Balance training;Neuromuscular re-education;Patient/family education    PT Goals (Current goals can be found in the Care Plan section)  Acute Rehab PT Goals Patient Stated Goal: Return home PT Goal Formulation: With patient Time For Goal Achievement: 08/16/19 Potential to Achieve Goals: Good    Frequency Min 3X/week   Barriers to discharge        Co-evaluation               AM-PAC PT "6 Clicks" Mobility  Outcome Measure Help needed turning from your back to your side while in a flat bed without using bedrails?: None Help needed moving from lying on your back to sitting on the side of a flat bed without using bedrails?: None Help needed moving to and from a bed to a chair (including a wheelchair)?: None Help needed standing up from a chair using your arms (e.g., wheelchair or bedside chair)?: None Help needed to walk in hospital room?: A Little Help needed climbing 3-5 steps with a  railing? : A Little 6 Click Score: 22    End of Session Equipment Utilized During Treatment: Gait belt Activity Tolerance: Patient tolerated treatment well Patient left: in chair;with call bell/phone within reach;with chair alarm set Nurse Communication: Mobility status PT Visit Diagnosis: Unsteadiness on feet (R26.81);Other abnormalities of gait and mobility (R26.89);Muscle weakness (generalized) (M62.81);Repeated falls (R29.6)    Time: 8182-9937 PT Time Calculation (min) (ACUTE ONLY): 22 min   Charges:   PT Evaluation $PT Eval Moderate Complexity: 1 Mod PT Treatments $Therapeutic Activity: 8-22 mins        8:38 AM, 08/02/19 Wyman Songster PT, DPT Physical Therapist at [facility]

## 2019-08-02 NOTE — Care Management Obs Status (Signed)
MEDICARE OBSERVATION STATUS NOTIFICATION   Patient Details  Name: Stanley Levy MRN: [id number] Date of Birth: 10/17/1955   Medicare Observation Status Notification Given:  Yes    Elliot Gault, LCSW 08/02/2019, 3:33 PM

## 2019-08-02 NOTE — Care Management CC44 (Signed)
Condition Code 44 Documentation Completed  Patient Details  Name: Stanley Levy MRN: [id number] Date of Birth: [birth year]   Condition Code 44 given:  Yes Patient signature on Condition Code 44 notice:  Yes Documentation of 2 MD's agreement:  Yes Code 44 added to claim:  Yes    Elliot Gault, LCSW 08/02/2019, 3:33 PM

## 2019-08-02 NOTE — Progress Notes (Signed)
Inpatient Diabetes Program Recommendations  AACE/ADA: New Consensus Statement on Inpatient Glycemic Control (2015)  Target Ranges:  Prepandial:   less than 140 mg/dL      Peak postprandial:   less than 180 mg/dL (1-2 hours)      Critically ill patients:  140 - 180 mg/dL   Lab Results  Component Value Date   GLUCAP 280 (H) 08/02/2019   HGBA1C >18.5 (H) 08/01/2019    Review of Glycemic Control  Diabetes history: DM2 Outpatient Diabetes medications: None (previously on insulin years ago) Current orders for Inpatient glycemic control: Novolog 0-15 units Q4H  HgbA1C - > 18.5% - uncontrolled  Inpatient Diabetes Program Recommendations:  (For Discharge)  Novolin ReliOn Insulin pen 12 units bid (#[id number]) Blood glucose meter kit (#[id number]) Insulin pen needles (#[id number])  Monitor blood sugars 3-4x/day Appt with PCP within 1-2 weeks of discharge  Spoke with pt over phone about his uncontrolled diabetes and HgbA1C of >18.5%. Pt was on insulin many years ago and doesn't know why he stopped taking it. Discussed how food, exercise, meds, insulin all affect blood sugar control. Discussed importance of getting blood sugars in control to reduce risks of long-term complications. Pt states he drinks large quantities of sweet tea and soda, eats high sugar foods like cakes, pies, cookies and milkshakes. Discussed alternatives to these. Pt states he will "pay attention now" to his diabetes. Willing to check blood sugars and give insulin with insulin pen. Will follow-up with PCP for management of his diabetes. Discussed hypoglycemia s/s and treatment. Feel confidant in giving himself insulin injections with pen. Answered questions and pt states he wants to control his blood sugars now. Instructed to call PCP for any questions or if blood sugars consistently > 200 mg/dL.   Secure text to MD and RN.  Thank you. Lorenda Peck, RD, LDN, CDE Inpatient Diabetes Coordinator [phone]

## 2019-08-02 NOTE — Plan of Care (Signed)
  Problem: Acute Rehab PT Goals(only PT should resolve) Goal: Patient Will Transfer Sit To/From Stand Outcome: Progressing Flowsheets (Taken 08/02/2019 0840) Patient will transfer sit to/from stand: with modified independence Goal: Pt Will Transfer Bed To Chair/Chair To Bed Outcome: Progressing Flowsheets (Taken 08/02/2019 0840) Pt will Transfer Bed to Chair/Chair to Bed: with modified independence Goal: Pt Will Ambulate Outcome: Progressing Flowsheets (Taken 08/02/2019 0840) Pt will Ambulate:  > 125 feet  with modified independence Goal: Pt/caregiver will Perform Home Exercise Program Outcome: Progressing Flowsheets (Taken 08/02/2019 0840) Pt/caregiver will Perform Home Exercise Program:  For increased strengthening  For improved balance  Independently  8:40 AM, 08/02/19 Wyman Songster PT, DPT Physical Therapist at [facility]

## 2019-08-02 NOTE — Discharge Summary (Addendum)
Physician Discharge Summary  Stanley Levy GYB:[id number] DOB: Oct 16, 1955 DOA: 08/01/2019  PCP: Patient, No Pcp Per  Admit date: 08/01/2019  Discharge date: 08/02/2019  Admitted From: Home  Disposition: Home  Recommendations for Outpatient Follow-up:  1. Follow up with PCP in 1-2 weeks, new PCP has been set up for follow-up 2. Continue on Novolin FlexPen 70/30,12 units twice daily as prescribed 3. Monitor diet carefully and avoid sugary beverages and concentrated sweets 4. Monitor blood glucose carefully 3-4 times daily while on the insulin 5. Glucometer kit prescribed 6. Gabapentin 173m tid prescribed for neuropathy  Home Health: None  Equipment/Devices: None  Discharge Condition: Stable  CODE STATUS: Full  Diet recommendation: Heart Healthy/carb modified  Brief/Interim Summary: Per HPI:  Stanley HILLYARDis an 64y.o. male with previous history of DM 2 who has been off medications for the past 2 years is brought in by his wife who states that for the past 3 weeks he has been repeating himself, falling asleep easily and talking repetitively and not making sense at times.  He apparently has been sent home from work multiple times because of frequent falls and memory loss.  He was sent home apparently on January 16 after a fall and has not been back to work since.  Patient himself tells me that he was sent home from work on January 16 because "I had slowed down on my job".  He does remember falling once I asked him if he did fall.  Patient states that he has mostly just been very tired and somewhat weak.  He admits that he sometimes confused but not always.  Notes he has not taken diabetes medications for a few years.  Is not sure what he was on before or why he is off of it now.  Patient denies fevers or chills.  No cough or shortness of breath.  No chest pain.  No headache.  No dysuria abdominal pain vomiting or diarrhea.  His appetite is good.  No loss of smell.  Patient was  admitted with marked symptomatic hyperglycemia related to a hyperosmolar state.  He did not require IV insulin drip, but did receive IV fluid and was started on subcutaneous insulin with aggressive administration after which point in time his blood glucose levels did stabilize.  His hemoglobin A1c is noted to be greater than 18.5% and he has been entirely noncompliant with his home regimen feeling that he was "cured" of his diabetes.  He continues to drink sugary beverages any concentrated sweets and was counseled on needing to stick to a healthier diet with carbohydrate restriction.  His brain MRI did not demonstrate any acute findings and his confusion has now resolved.  He has been ambulated by physical therapy with no home health needs noted and no other deficits.  He has been seen by the diabetes coordinator with recommendations for starting Novolin 70/30 insulin, 12 units twice daily and to monitor his blood glucose carefully.  He will be set up with a new PCP to follow-up within the next 1-2 weeks.  He has had no other concerns or acute events during this admission and is otherwise stable for discharge at this time.  Discharge Diagnoses:  Principal Problem:   Hyperglycemia due to diabetes mellitus (HWelcome Active Problems:   Weakness   Subacute confusional state  Principal discharge diagnosis: Symptomatic hyperglycemia in the setting of chronic hyperosmolar state secondary to type 2 diabetes with medication noncompliance.  Discharge Instructions  Discharge Instructions  Diet - low sodium heart healthy   Complete by: As directed    Increase activity slowly   Complete by: As directed      Allergies as of 08/02/2019   No Known Allergies     Medication List    TAKE these medications   blood glucose meter kit and supplies Dispense based on patient and insurance preference. Use up to four times daily as directed. (FOR ICD-10 E10.9, E11.9).   gabapentin 100 MG capsule Commonly known as:  Neurontin Take 1 capsule (100 mg total) by mouth 3 (three) times daily.   imipramine 50 MG tablet Commonly known as: TOFRANIL Take 1 tablet (50 mg total) by mouth at bedtime.   Insulin Pen Needle 31G X 5 MM Misc 1 Units by Does not apply route 2 (two) times daily.   NovoLOG Mix 70/30 FlexPen (70-30) 100 UNIT/ML FlexPen Generic drug: insulin aspart protamine - aspart Inject 0.12 mLs (12 Units total) into the skin 2 (two) times daily with a meal.      Follow-up Information    Perlie Mayo, NP Follow up in 1 week(s).   Specialty: Family Medicine Contact information: [place] [address] [phone]          No Known Allergies  Consultations:  None   Procedures/Studies: CT HEAD WO CONTRAST  Result Date: 08/01/2019 CLINICAL DATA:  Multiple falls. EXAM: CT HEAD WITHOUT CONTRAST TECHNIQUE: Contiguous axial images were obtained from the base of the skull through the vertex without intravenous contrast. COMPARISON:  None. FINDINGS: Brain: There is moderate severity cerebral atrophy with widening of the extra-axial spaces and ventricular dilatation. There are areas of decreased attenuation within the white matter tracts of the supratentorial brain, consistent with microvascular disease changes. Adjacent 1 mm and 2 mm foci of white matter calcification are seen along the posterior aspect of the centrum semiovale on the left. Vascular: No hyperdense vessel or unexpected calcification. Skull: Normal. Negative for fracture or focal lesion. Sinuses/Orbits: No acute finding. Other: None. IMPRESSION: 1. Generalized cerebral atrophy. 2. No acute intracranial abnormality. Electronically Signed   By: Virgina Norfolk M.D.   On: 08/01/2019 20:08   MR BRAIN WO CONTRAST  Result Date: 08/02/2019 CLINICAL DATA:  64 year old male with multiple falls. Encephalopathy, fatigue. EXAM: MRI HEAD WITHOUT CONTRAST TECHNIQUE: Multiplanar, multiecho pulse sequences of the brain and surrounding  structures were obtained without intravenous contrast. COMPARISON:  Head CT yesterday. FINDINGS: Brain: Confluent left superior frontal lobe white matter gliosis and encephalomalacia in continuity with the left lateral ventricle. No restricted diffusion to suggest acute infarction. No midline shift, mass effect, evidence of mass lesion, ventriculomegaly, extra-axial collection or acute intracranial hemorrhage. Cervicomedullary junction and pituitary are within normal limits. Additional Patchy and confluent bilateral cerebral white matter T2 and FLAIR hyperintensity. No cortical encephalomalacia or chronic cerebral blood products identified. The deep gray nuclei, brainstem and cerebellum are within normal limits. Vascular: Major intracranial vascular flow voids are preserved. Skull and upper cervical spine: Negative visible cervical spine. Visualized bone marrow signal is within normal limits. Sinuses/Orbits: Negative orbits. Trace paranasal sinus mucosal thickening. Other: Mastoids are clear. Visible internal auditory structures appear normal. Scalp and face soft tissues appear negative. IMPRESSION: 1. No acute intracranial abnormality. 2. 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 Additional moderate for age cerebral white matter signal changes, most commonly due to chronic small vessel disease. Electronically Signed   By: Genevie Ann M.D.   On: 08/02/2019 07:44  DG Chest Portable 1 View  Result Date: 08/01/2019 CLINICAL DATA:  Change in mental status EXAM: PORTABLE CHEST 1 VIEW COMPARISON:  None. FINDINGS: The heart size and mediastinal contours are within normal limits. Both lungs are clear. The visualized skeletal structures are unremarkable. IMPRESSION: No active disease. Electronically Signed   By: Prudencio Pair M.D.   On: 08/01/2019 17:46   Discharge Exam: Vitals:   08/02/19 1308 08/02/19 1530  BP: 120/87   Pulse: 96 98  Resp: 20   Temp: 97.8 F (36.6 C)   SpO2:  100% 96%   Vitals:   08/02/19 0610 08/02/19 1029 08/02/19 1308 08/02/19 1530  BP: 107/80 116/85 120/87   Pulse: 88 89 96 98  Resp: _0 Temp: 97.7 F (36.5 C) 97.7 F (36.5 C) 97.8 F (36.6 C)   TempSrc: Oral Oral Oral   SpO2: 98%  100% 96%  Weight:      Height:        General: Pt is alert, awake, not in acute distress Cardiovascular: RRR, S1/S2 +, no rubs, no gallops Respiratory: CTA bilaterally, no wheezing, no rhonchi Abdominal: Soft, NT, ND, bowel sounds + Extremities: no edema, no cyanosis    The results of significant diagnostics from this hospitalization (including imaging, microbiology, ancillary and laboratory) are listed below for reference.     Microbiology: Recent Results (from the past 240 hour(s))  Respiratory Panel by RT PCR (Flu A&B, Covid) - Nasopharyngeal Swab     Status: None   Collection Time: 08/01/19  4:25 PM   Specimen: Nasopharyngeal Swab  Result Value Ref Range Status   SARS Coronavirus 2 by RT PCR NEGATIVE NEGATIVE Final    Comment: (NOTE) SARS-CoV-2 target nucleic acids are NOT DETECTED. The SARS-CoV-2 RNA is generally detectable in upper respiratoy specimens during the acute phase of infection. The lowest concentration of SARS-CoV-2 viral copies this assay can detect is 131 copies/mL. A negative result does not preclude SARS-Cov-2 infection and should not be used as the sole basis for treatment or other patient management decisions. A negative result may occur with  improper specimen collection/handling, submission of specimen other than nasopharyngeal swab, presence of viral mutation(s) within the areas targeted by this assay, and inadequate number of viral copies (<131 copies/mL). A negative result must be combined with clinical observations, patient history, and epidemiological information. The expected result is Negative. Fact Sheet for Patients:  PinkCheek.be Fact Sheet for Healthcare Providers:   GravelBags.it This test is not yet ap proved or cleared by the [place] FDA and  has been authorized for detection and/or diagnosis of SARS-CoV-2 by FDA under an Emergency Use Authorization (EUA). This EUA will remain  in effect (meaning this test can be used) for the duration of the COVID-19 declaration under Section 564(b)(1) of the Act, 21 U.S.C. section 360bbb-3(b)(1), unless the authorization is terminated or revoked sooner.    Influenza A by PCR NEGATIVE NEGATIVE Final   Influenza B by PCR NEGATIVE NEGATIVE Final    Comment: (NOTE) The Xpert Xpress SARS-CoV-2/FLU/RSV assay is intended as an aid in  the diagnosis of influenza from Nasopharyngeal swab specimens and  should not be used as a sole basis for treatment. Nasal washings and  aspirates are unacceptable for Xpert Xpress SARS-CoV-2/FLU/RSV  testing. Fact Sheet for Patients: PinkCheek.be Fact Sheet for Healthcare Providers: GravelBags.it This test is not yet approved or cleared by the [place] FDA and  has been authorized for detection and/or diagnosis of SARS-CoV-2 by  FDA under an Emergency Use Authorization (EUA). This EUA will remain  in effect (meaning this test can be used) for the duration of the  Covid-19 declaration under Section 564(b)(1) of the Act, 21  U.S.C. section 360bbb-3(b)(1), unless the authorization is  terminated or revoked. Performed at [facility], [address], [place], [place] [address]      Labs: BNP (last 3 results) No results for input(s): BNP in the last 8760 hours. Basic Metabolic Panel: Recent Labs  Lab 08/01/19 1638 08/01/19 1653 08/02/19 0608  NA 135 135 143  K 4.3 4.3 3.4*  CL 98 99 109  CO2 24  --  27  GLUCOSE 603* 584* 77  BUN 27* 25* 22  CREATININE 1.19 1.00 0.83  CALCIUM 8.6*  --  8.2*   Liver Function Tests: Recent Labs  Lab 08/01/19 1638  AST 66*  ALT 111*  ALKPHOS  470*  BILITOT 1.8*  PROT 6.3*  ALBUMIN 3.1*   No results for input(s): LIPASE, AMYLASE in the last 168 hours. Recent Labs  Lab 08/01/19 1638  AMMONIA 26   CBC: Recent Labs  Lab 08/01/19 1638 08/01/19 1653 08/02/19 0608  WBC 7.4  --  7.3  NEUTROABS 5.2  --   --   HGB 14.9 16.7 13.9  HCT 47.9 49.0 44.2  MCV 101.9*  --  100.7*  PLT 125*  --  103*   Cardiac Enzymes: No results for input(s): CKTOTAL, CKMB, CKMBINDEX, TROPONINI in the last 168 hours. BNP: Invalid input(s): POCBNP CBG: Recent Labs  Lab 08/01/19 2303 08/02/19 0402 08/02/19 0718 08/02/19 1128 08/02/19 1616  GLUCAP 297* 87 105* 280* 333*   D-Dimer No results for input(s): DDIMER in the last 72 hours. Hgb A1c Recent Labs    08/01/19 1637  HGBA1C >18.5*   Lipid Profile No results for input(s): CHOL, HDL, LDLCALC, TRIG, CHOLHDL, LDLDIRECT in the last 72 hours. Thyroid function studies Recent Labs    08/01/19 1638  TSH 15.414*   Anemia work up No results for input(s): VITAMINB12, FOLATE, FERRITIN, TIBC, IRON, RETICCTPCT in the last 72 hours. Urinalysis No results found for: COLORURINE, APPEARANCEUR, [place], [place], [place], [place], [place], [place], PROTEINUR, UROBILINOGEN, NITRITE, LEUKOCYTESUR Sepsis Labs Invalid input(s): PROCALCITONIN,  WBC,  LACTICIDVEN Microbiology Recent Results (from the past 240 hour(s))  Respiratory Panel by RT PCR (Flu A&B, Covid) - Nasopharyngeal Swab     Status: None   Collection Time: 08/01/19  4:25 PM   Specimen: Nasopharyngeal Swab  Result Value Ref Range Status   SARS Coronavirus 2 by RT PCR NEGATIVE NEGATIVE Final    Comment: (NOTE) SARS-CoV-2 target nucleic acids are NOT DETECTED. The SARS-CoV-2 RNA is generally detectable in upper respiratoy specimens during the acute phase of infection. The lowest concentration of SARS-CoV-2 viral copies this assay can detect is 131 copies/mL. A negative result does not preclude SARS-Cov-2 infection and should  not be used as the sole basis for treatment or other patient management decisions. A negative result may occur with  improper specimen collection/handling, submission of specimen other than nasopharyngeal swab, presence of viral mutation(s) within the areas targeted by this assay, and inadequate number of viral copies (<131 copies/mL). A negative result must be combined with clinical observations, patient history, and epidemiological information. The expected result is Negative. Fact Sheet for Patients:  PinkCheek.be Fact Sheet for Healthcare Providers:  GravelBags.it This test is not yet ap proved or cleared by the [place] FDA and  has been authorized for detection and/or diagnosis  of SARS-CoV-2 by FDA under an Emergency Use Authorization (EUA). This EUA will remain  in effect (meaning this test can be used) for the duration of the COVID-19 declaration under Section 564(b)(1) of the Act, 21 U.S.C. section 360bbb-3(b)(1), unless the authorization is terminated or revoked sooner.    Influenza A by PCR NEGATIVE NEGATIVE Final   Influenza B by PCR NEGATIVE NEGATIVE Final    Comment: (NOTE) The Xpert Xpress SARS-CoV-2/FLU/RSV assay is intended as an aid in  the diagnosis of influenza from Nasopharyngeal swab specimens and  should not be used as a sole basis for treatment. Nasal washings and  aspirates are unacceptable for Xpert Xpress SARS-CoV-2/FLU/RSV  testing. Fact Sheet for Patients: PinkCheek.be Fact Sheet for Healthcare Providers: GravelBags.it This test is not yet approved or cleared by the [place] FDA and  has been authorized for detection and/or diagnosis of SARS-CoV-2 by  FDA under an Emergency Use Authorization (EUA). This EUA will remain  in effect (meaning this test can be used) for the duration of the  Covid-19 declaration under Section 564(b)(1)  of the Act, 21  U.S.C. section 360bbb-3(b)(1), unless the authorization is  terminated or revoked. Performed at [facility], [address], [place], [place] [address]      Time coordinating discharge: 35 minutes  SIGNED:   Rodena Goldmann, DO [facility] 08/02/2019, 4:45 PM  If 7PM-7AM, please contact night-coverage [email or web address]

## 2019-08-02 NOTE — TOC Transition Note (Signed)
Transition of Care [facility]) - CM/SW Discharge Note   Patient Details  Name: Stanley Levy MRN: [id number] Date of Birth: 29-Sep-1955  [facility]) CM/SW Contact:  Elliot Gault, LCSW Phone Number: 08/02/2019, 1:28 PM   Clinical Narrative:      Pt admitted from home and MD indicates pt will likely be stable for dc home today. Per MD, pt has not been taking his diabetes medication or measuring blood sugars for the last two years. MD has re-started pt on insulin and indicates pt needs a PCP established.  Spoke with pt by phone to assess. Pt reports that he is agreeable to [facility] arranging for a new PCP. He is aware that the appointment information will be added to his AVS. Pt states that he obtains medications [facility] [place] and he states that he will get his insulin from there. Pt states he does not have a glucometer and that he will need to get a new one. Pt states he plans to get this at [facility]. Pt informs this LCSW that he will follow up with measuring blood sugar and taking insulin as prescribed.  There are no other TOC needs for dc.  Follow up appointment with [facility] schedule for next week and entered on AVS.    Expected Discharge Plan: Home/Self Care     Patient Goals and CMS Choice        Expected Discharge Plan and Services Expected Discharge Plan: Home/Self Care In-house Referral: Clinical Social Work Discharge Planning Services: Follow-up appt scheduled   Living arrangements for the past 2 months: Single Family Home                                      Prior Living Arrangements/Services Living arrangements for the past 2 months: Single Family Home Lives with:: Spouse Patient language and need for interpreter reviewed:: Yes Do you feel safe going back to the place where you live?: Yes      Need for Family Participation in Patient Care: No (Comment) Care giver support system in place?: Yes (comment)   Criminal  Activity/Legal Involvement Pertinent to Current Situation/Hospitalization: No - Comment as needed  Activities of Daily Living Home Assistive Devices/Equipment: Eyeglasses ADL Screening (condition at time of admission) Patient's cognitive ability adequate to safely complete daily activities?: Yes Is the patient deaf or have difficulty hearing?: No Does the patient have difficulty seeing, even when wearing glasses/contacts?: No Does the patient have difficulty concentrating, remembering, or making decisions?: Yes Patient able to express need for assistance with ADLs?: Yes Does the patient have difficulty dressing or bathing?: No Independently performs ADLs?: Yes (appropriate for developmental age) Does the patient have difficulty walking or climbing stairs?: No Weakness of Legs: Both Weakness of Arms/Hands: Both  Permission Sought/Granted                  Emotional Assessment   Attitude/Demeanor/Rapport: Engaged Affect (typically observed): Pleasant Orientation: : Oriented to Self, Oriented to Place, Oriented to  Time, Oriented to Situation Alcohol / Substance Use: Not Applicable Psych Involvement: No (comment)  Admission diagnosis:  Hyperglycemia [R73.9] Altered mental status, unspecified altered mental status type [R41.82] Hyperglycemia due to diabetes mellitus (HCC) [E11.65] Patient Active Problem List   Diagnosis Date Noted  . Hyperglycemia due to diabetes mellitus (HCC) 08/01/2019  . Weakness 08/01/2019  . Subacute confusional state 08/01/2019  .  Anxiety disorder 11/25/2011   PCP:  Patient, No Pcp Per Pharmacy:   [id number] [place], [place] [address] [place] [address] Phone: [phone] Fax: [phone]     Social Determinants of Health (SDOH) Interventions    Readmission Risk Interventions Readmission Risk Prevention Plan 08/02/2019  Post Dischage Appt Complete  Medication Screening Complete   Transportation Screening Complete  Some recent data might be hidden    Final next level of care: Home/Self Care     Patient Goals and CMS Choice        Discharge Placement                       Discharge Plan and Services In-house Referral: Clinical Social Work Discharge Planning Services: Follow-up appt scheduled                                 Social Determinants of Health (SDOH) Interventions     Readmission Risk Interventions Readmission Risk Prevention Plan 08/02/2019  Post Dischage Appt Complete  Medication Screening Complete  Transportation Screening Complete  Some recent data might be hidden

## 2019-08-03 LAB — HIV ANTIBODY (ROUTINE TESTING W REFLEX): HIV Screen 4th Generation wRfx: NONREACTIVE — AB

## 2019-08-10 ENCOUNTER — Other Ambulatory Visit: Payer: Self-pay

## 2019-08-10 ENCOUNTER — Encounter (INDEPENDENT_AMBULATORY_CARE_PROVIDER_SITE_OTHER): Payer: Self-pay | Admitting: *Deleted

## 2019-08-10 ENCOUNTER — Ambulatory Visit (INDEPENDENT_AMBULATORY_CARE_PROVIDER_SITE_OTHER): Payer: PPO | Admitting: Family Medicine

## 2019-08-10 ENCOUNTER — Encounter: Payer: Self-pay | Admitting: Family Medicine

## 2019-08-10 VITALS — BP 118/84 | HR 99 | Temp 96.6°F | Resp 15 | Ht 69.0 in | Wt 205.0 lb

## 2019-08-10 DIAGNOSIS — R296 Repeated falls: Secondary | ICD-10-CM

## 2019-08-10 DIAGNOSIS — I10 Essential (primary) hypertension: Secondary | ICD-10-CM

## 2019-08-10 DIAGNOSIS — R413 Other amnesia: Secondary | ICD-10-CM | POA: Diagnosis not present

## 2019-08-10 DIAGNOSIS — E1165 Type 2 diabetes mellitus with hyperglycemia: Secondary | ICD-10-CM

## 2019-08-10 DIAGNOSIS — Z1211 Encounter for screening for malignant neoplasm of colon: Secondary | ICD-10-CM | POA: Diagnosis not present

## 2019-08-10 DIAGNOSIS — R5383 Other fatigue: Secondary | ICD-10-CM

## 2019-08-10 DIAGNOSIS — R7989 Other specified abnormal findings of blood chemistry: Secondary | ICD-10-CM | POA: Diagnosis not present

## 2019-08-10 DIAGNOSIS — Z1159 Encounter for screening for other viral diseases: Secondary | ICD-10-CM | POA: Diagnosis not present

## 2019-08-10 NOTE — Patient Instructions (Addendum)
Happy New Year! May you have a year filled with hope, love, happiness and laughter.  I appreciate the opportunity to provide you with care for your health and wellness. Today we discussed: establish care   Follow up: 4 weeks (MMSE)  Lab tomorrow morning 08/10/2019 at [facility] Referrals placed today for Dr Fransico Him down the street for help with thyroid and diabetes.  Please do not drink chocolate milk, sweet tea, or root beer. Avoid extra sugar in diet: drink diet versions like diet root beer and unsweet tea.  Please continue to practice social distancing to keep you, your family, and our community safe.  If you must go out, please wear a mask and practice good handwashing.  It was a pleasure to see you and I look forward to continuing to work together on your health and well-being. Please do not hesitate to call the office if you need care or have questions about your care.  Have a wonderful day and week. With Gratitude, Tereasa Coop, DNP, AGNP-BC   Diabetes Mellitus and Nutrition, Adult When you have diabetes (diabetes mellitus), it is very important to have healthy eating habits because your blood sugar (glucose) levels are greatly affected by what you eat and drink. Eating healthy foods in the appropriate amounts, at about the same times every day, can help you:  Control your blood glucose.  Lower your risk of heart disease.  Improve your blood pressure.  Reach or maintain a healthy weight. Every person with diabetes is different, and each person has different needs for a meal plan. Your health care provider may recommend that you work with a diet and nutrition specialist (dietitian) to make a meal plan that is best for you. Your meal plan may vary depending on factors such as:  The calories you need.  The medicines you take.  Your weight.  Your blood glucose, blood pressure, and cholesterol levels.  Your activity level.  Other health conditions you have, such as heart or  kidney disease. How do carbohydrates affect me? Carbohydrates, also called carbs, affect your blood glucose level more than any other type of food. Eating carbs naturally raises the amount of glucose in your blood. Carb counting is a method for keeping track of how many carbs you eat. Counting carbs is important to keep your blood glucose at a healthy level, especially if you use insulin or take certain oral diabetes medicines. It is important to know how many carbs you can safely have in each meal. This is different for every person. Your dietitian can help you calculate how many carbs you should have at each meal and for each snack. Foods that contain carbs include:  Bread, cereal, rice, pasta, and crackers.  Potatoes and corn.  Peas, beans, and lentils.  Milk and yogurt.  Fruit and juice.  Desserts, such as cakes, cookies, ice cream, and candy. How does alcohol affect me? Alcohol can cause a sudden decrease in blood glucose (hypoglycemia), especially if you use insulin or take certain oral diabetes medicines. Hypoglycemia can be a life-threatening condition. Symptoms of hypoglycemia (sleepiness, dizziness, and confusion) are similar to symptoms of having too much alcohol. If your health care provider says that alcohol is safe for you, follow these guidelines:  Limit alcohol intake to no more than 1 drink per day for nonpregnant women and 2 drinks per day for men. One drink equals 12 oz of beer, 5 oz of wine, or 1 oz of hard liquor.  Do not drink on an  empty stomach.  Keep yourself hydrated with water, diet soda, or unsweetened iced tea.  Keep in mind that regular soda, juice, and other mixers may contain a lot of sugar and must be counted as carbs. What are tips for following this plan?  Reading food labels  Start by checking the serving size on the "Nutrition Facts" label of packaged foods and drinks. The amount of calories, carbs, fats, and other nutrients listed on the label is  based on one serving of the item. Many items contain more than one serving per package.  Check the total grams (g) of carbs in one serving. You can calculate the number of servings of carbs in one serving by dividing the total carbs by 15. For example, if a food has 30 g of total carbs, it would be equal to 2 servings of carbs.  Check the number of grams (g) of saturated and trans fats in one serving. Choose foods that have low or no amount of these fats.  Check the number of milligrams (mg) of salt (sodium) in one serving. Most people should limit total sodium intake to less than 2,300 mg per day.  Always check the nutrition information of foods labeled as "low-fat" or "nonfat". These foods may be higher in added sugar or refined carbs and should be avoided.  Talk to your dietitian to identify your daily goals for nutrients listed on the label. Shopping  Avoid buying canned, premade, or processed foods. These foods tend to be high in fat, sodium, and added sugar.  Shop around the outside edge of the grocery store. This includes fresh fruits and vegetables, bulk grains, fresh meats, and fresh dairy. Cooking  Use low-heat cooking methods, such as baking, instead of high-heat cooking methods like deep frying.  Cook using healthy oils, such as olive, canola, or sunflower oil.  Avoid cooking with butter, cream, or high-fat meats. Meal planning  Eat meals and snacks regularly, preferably at the same times every day. Avoid going long periods of time without eating.  Eat foods high in fiber, such as fresh fruits, vegetables, beans, and whole grains. Talk to your dietitian about how many servings of carbs you can eat at each meal.  Eat 4-6 ounces (oz) of lean protein each day, such as lean meat, chicken, fish, eggs, or tofu. One oz of lean protein is equal to: ? 1 oz of meat, chicken, or fish. ? 1 egg. ?  cup of tofu.  Eat some foods each day that contain healthy fats, such as avocado,  nuts, seeds, and fish. Lifestyle  Check your blood glucose regularly.  Exercise regularly as told by your health care provider. This may include: ? 150 minutes of moderate-intensity or vigorous-intensity exercise each week. This could be brisk walking, biking, or water aerobics. ? Stretching and doing strength exercises, such as yoga or weightlifting, at least 2 times a week.  Take medicines as told by your health care provider.  Do not use any products that contain nicotine or tobacco, such as cigarettes and e-cigarettes. If you need help quitting, ask your health care provider.  Work with a Social worker or diabetes educator to identify strategies to manage stress and any emotional and social challenges. Questions to ask a health care provider  Do I need to meet with a diabetes educator?  Do I need to meet with a dietitian?  What number can I call if I have questions?  When are the best times to check my blood glucose? Where  to find more information:  American Diabetes Association: diabetes.org  Academy of Nutrition and Dietetics: www.eatright.[employer] of Diabetes and Digestive and Kidney Diseases (NIH): CarFli[employer].tn Summary  A healthy meal plan will help you control your blood glucose and maintain a healthy lifestyle.  Working with a diet and nutrition specialist (dietitian) can help you make a meal plan that is best for you.  Keep in mind that carbohydrates (carbs) and alcohol have immediate effects on your blood glucose levels. It is important to count carbs and to use alcohol carefully. This information is not intended to replace advice given to you by your health care provider. Make sure you discuss any questions you have with your health care provider. Document Revised: 05/29/2017 Document Reviewed: 07/21/2016 Elsevier Patient Education  2020 [employer].

## 2019-08-10 NOTE — Progress Notes (Signed)
   Subjective:  Patient ID: Stanley Levy, male    DOB: 03/26/1956  Age: 64 y.o. MRN: [id number]  CC:  Chief Complaint  Patient presents with  . New Patient (Initial Visit)    establish care      HPI  HPI  [name] is a 64-year-old male with a previous history of diabetes type 2 who has been off medications for last 2 years.  He was taken to the emergency room by his wife who stated for the last 3 weeks he has been repeating himself, falling asleep easily and talking repetitively and not making sense at times to her.  He apparently has been sent home from work multiple times because of frequent falls and new onset memory loss.  The last time he was sent home was January 16 after a fall and had not been to work since.  To the ED provider he reported that he was sent home from work on the 16th because" I had slowed down on my job" he does remember falling once after he was asked about falling.  He reported that he has been mostly tired and weak and he reported that to me today as well.  He reports that he has had some confusions and some memory loss at times.  But verbally will look at you and agree that he understands what you are saying.  Not been taking any diabetic medications for several years.  He has no recollection of what he was on though he reports that he remembers learning how to check his blood sugars.  At the emergency room on February 1 he denied having fevers chills, cough, shortness of breath, chest pain, headache, dysuria, abdominal pain, vomiting or diarrhea.  Reported that his appetite was good he had no loss of smell.  He ended up being admitted for 24 hours with marked symptomatic hypoglycemia related to hyperosmolar state.  They did not start insulin drip but he did get IV fluid and subcutaneous insulin with aggressive administration after which point his blood sugar levels began to stabilize.  His hemoglobin A1c was found to be greater than 18.5% and it is seems that he  had not been compliant with maintaining his diabetic medication thinking that he was " cured" of his diabetes.  He continues and reports that he drinks sugary beverages such as chocolate milk.  He was provided with extensive education on how to eat healthier and carbohydrate restriction.  While in the emergency room he had an MRI of the brain and it did not demonstrate any acute findings and his state of confusion have resolved at discharge.  He ambulated with physical therapy with no at home notes needed for deficits.  Was seen by diabetic coordinator with recommendations of starting 70/30 insulin 12 units twice daily and to monitor his blood glucose carefully he was set up with a new PCP myself and is here to establish today.  He reports that he is open to getting lab work.  Is not eating well.  And verbally repeat similar to what was discussed in the emergency room.  He helps take care of his wife who has had several strokes.  She is very worried about him and wants to make sure that he gets the care he needs.  They are open to a referral to Dr. Nida's office as his TSH was elevated as well.  They are open to me getting a full work-up of labs as well.  He reported   that he had the insulin pen at home, blood glucose kit at home and was taking all of his medications and checking his blood sugar as directed.  His wife did not verbalize any change of this.  Just reported that she was worried about him and wanted to make sure that he got the care he needed.  Today patient denies signs and symptoms of COVID 19 infection including fever, chills, cough, shortness of breath, and headache. Past Medical, Surgical, Social History, Allergies, and Medications have been Reviewed.   Past Medical History:  Diagnosis Date  . Anxiety   . Diabetes mellitus, type II (HCC)   . Hyperlipidemia   . Thyroid disease     Current Meds  Medication Sig  . blood glucose meter kit and supplies Dispense based on patient and  insurance preference. Use up to four times daily as directed. (FOR ICD-10 E10.9, E11.9). (Patient not taking: Reported on 08/15/2019)  . gabapentin (NEURONTIN) 100 MG capsule Take 1 capsule (100 mg total) by mouth 3 (three) times daily.  . imipramine (TOFRANIL) 50 MG tablet Take 1 tablet (50 mg total) by mouth at bedtime.  . Insulin Pen Needle 31G X 5 MM MISC 1 Units by Does not apply route 2 (two) times daily. (Patient not taking: Reported on 08/15/2019)  . [DISCONTINUED] insulin aspart protamine - aspart (NOVOLOG MIX 70/30 FLEXPEN) (70-30) 100 UNIT/ML FlexPen Inject 0.12 mLs (12 Units total) into the skin 2 (two) times daily with a meal. (Patient not taking: Reported on 08/11/2019)  . [DISCONTINUED] Lancets (ONETOUCH DELICA PLUS LANCET30G) MISC 1 each by Does not apply route in the morning, at noon, in the evening, and at bedtime.    ROS:  Review of Systems  Constitutional: Negative.   HENT: Negative.   Eyes: Negative.   Respiratory: Negative.   Cardiovascular: Negative.   Gastrointestinal: Negative.   Genitourinary: Negative.   Musculoskeletal: Negative.   Skin: Negative.   Neurological: Negative.   Endo/Heme/Allergies: Negative.   Psychiatric/Behavioral: Negative.   All other systems reviewed and are negative.    Objective:   Today's Vitals: BP 118/84   Pulse 99   Temp (!) 96.6 F (35.9 C) (Temporal)   Resp 15   Ht 5' 9" (1.753 m)   Wt 205 lb 0.6 oz (93 kg)   SpO2 97%   BMI 30.28 kg/m  Vitals with BMI 08/17/2019 08/17/2019 08/17/2019  Height - - -  Weight - - 217 lbs 13 oz  BMI - - 32.15  Systolic 101 101 100  Diastolic 64 64 58  Pulse - 78 -  Some encounter information is confidential and restricted. Go to Review Flowsheets activity to see all data.     Physical Exam Vitals and nursing note reviewed.  Constitutional:      Appearance: Normal appearance. He is well-developed and well-groomed. He is obese.     Comments: Shoes are too big   HENT:     Head:  Normocephalic and atraumatic.     Right Ear: External ear normal.     Left Ear: External ear normal.     Mouth/Throat:     Comments: Mask in place  Eyes:     General:        Right eye: No discharge.        Left eye: No discharge.     Conjunctiva/sclera: Conjunctivae normal.     Comments: glasses  Cardiovascular:     Rate and Rhythm: Normal rate and regular rhythm.       Pulses: Normal pulses.     Heart sounds: Normal heart sounds.  Pulmonary:     Effort: Pulmonary effort is normal.     Breath sounds: Normal breath sounds.  Musculoskeletal:        General: Normal range of motion.     Cervical back: Normal range of motion and neck supple.     Comments: Cane   Skin:    General: Skin is warm.  Neurological:     General: No focal deficit present.     Mental Status: He is alert and oriented to person, place, and time.  Psychiatric:        Attention and Perception: Attention normal.        Mood and Affect: Mood normal.        Speech: Speech normal.        Behavior: Behavior normal. Behavior is cooperative.        Thought Content: Thought content normal.        Cognition and Memory: Cognition normal.        Judgment: Judgment normal.     Assessment   1. Type 2 diabetes mellitus with hyperglycemia, unspecified whether long term insulin use ([place])   2. Hyperglycemia due to diabetes mellitus ([place])   3. Memory loss   4. Falls frequently   5. Fatigue, unspecified type   6. Essential hypertension   7. Elevated TSH   8. Encounter for hepatitis C screening test for low risk patient   9. Encounter for screening for malignant neoplasm of colon     Tests ordered Orders Placed This Encounter  Procedures  . CBC  . COMPLETE METABOLIC PANEL WITH GFR  . HEP C AB W/REFL  . Lipid panel  . Microalbumin / creatinine urine ratio  . Vitamin B12  . VITAMIN D 25 Hydroxy (Vit-D Deficiency, Fractures)  . Thyroid Panel With TSH  . REFLEX TIQ  . Amb Referral to Nutrition and Diabetic E  .  Ambulatory referral to Gastroenterology  . Ambulatory referral to Ophthalmology  . Ambulatory referral to Endocrinology     Plan: Please see assessment and plan per problem list above.   No orders of the defined types were placed in this encounter.   Patient to follow-up in 2 weeks  Perlie Mayo, NP

## 2019-08-11 ENCOUNTER — Emergency Department (HOSPITAL_COMMUNITY)
Admission: EM | Admit: 2019-08-11 | Discharge: 2019-08-11 | Disposition: A | Discharge status: Home / Self Care | Payer: PPO | Attending: Emergency Medicine | Admitting: Emergency Medicine

## 2019-08-11 ENCOUNTER — Other Ambulatory Visit: Payer: Self-pay

## 2019-08-11 ENCOUNTER — Telehealth: Payer: Self-pay

## 2019-08-11 ENCOUNTER — Encounter (HOSPITAL_COMMUNITY): Payer: Self-pay

## 2019-08-11 DIAGNOSIS — Z794 Long term (current) use of insulin: Secondary | ICD-10-CM | POA: Diagnosis not present

## 2019-08-11 DIAGNOSIS — Z79899 Other long term (current) drug therapy: Secondary | ICD-10-CM | POA: Insufficient documentation

## 2019-08-11 DIAGNOSIS — E1165 Type 2 diabetes mellitus with hyperglycemia: Secondary | ICD-10-CM | POA: Insufficient documentation

## 2019-08-11 DIAGNOSIS — R5383 Other fatigue: Secondary | ICD-10-CM | POA: Diagnosis not present

## 2019-08-11 DIAGNOSIS — R7989 Other specified abnormal findings of blood chemistry: Secondary | ICD-10-CM | POA: Diagnosis not present

## 2019-08-11 DIAGNOSIS — R296 Repeated falls: Secondary | ICD-10-CM | POA: Diagnosis not present

## 2019-08-11 DIAGNOSIS — R413 Other amnesia: Secondary | ICD-10-CM | POA: Diagnosis not present

## 2019-08-11 DIAGNOSIS — R739 Hyperglycemia, unspecified: Secondary | ICD-10-CM

## 2019-08-11 DIAGNOSIS — Z1159 Encounter for screening for other viral diseases: Secondary | ICD-10-CM | POA: Diagnosis not present

## 2019-08-11 DIAGNOSIS — I10 Essential (primary) hypertension: Secondary | ICD-10-CM | POA: Diagnosis not present

## 2019-08-11 LAB — URINALYSIS, ROUTINE W REFLEX MICROSCOPIC
Bacteria, UA: NONE SEEN
Bilirubin Urine: NEGATIVE
Glucose, UA: >=500 mg/dL — AB
Hgb urine dipstick: NEGATIVE
Ketones, ur: NEGATIVE mg/dL
Leukocytes,Ua: NEGATIVE
Nitrite: NEGATIVE
Protein, ur: NEGATIVE mg/dL
Specific Gravity, Urine: 1.024 (ref 1.005–1.030)
pH: 5 (ref 5.0–8.0)

## 2019-08-11 LAB — CBC
HCT: 45.1 % (ref 39.0–52.0)
Hemoglobin: 14.8 g/dL (ref 13.0–17.0)
MCH: 31.9 pg (ref 26.0–34.0)
MCHC: 32.8 g/dL (ref 30.0–36.0)
MCV: 97.2 fL (ref 80.0–100.0)
Platelets: 153 10*3/uL (ref 150–400)
RBC: 4.64 MIL/uL (ref 4.22–5.81)
RDW: 14.9 % (ref 11.5–15.5)
WBC: 7.9 10*3/uL (ref 4.0–10.5)
nRBC: 0 % (ref 0.0–0.2)

## 2019-08-11 LAB — CBG MONITORING, ED
Glucose-Capillary: 515 mg/dL (ref 70–99)
Glucose-Capillary: 487 mg/dL — ABNORMAL HIGH (ref 70–99)
Glucose-Capillary: 392 mg/dL — ABNORMAL HIGH (ref 70–99)
Glucose-Capillary: 509 mg/dL (ref 70–99)

## 2019-08-11 LAB — BASIC METABOLIC PANEL
Anion gap: 12 (ref 5–15)
BUN: 24 mg/dL — ABNORMAL HIGH (ref 8–23)
CO2: 22 mmol/L (ref 22–32)
Calcium: 8.6 mg/dL — ABNORMAL LOW (ref 8.9–10.3)
Chloride: 91 mmol/L — ABNORMAL LOW (ref 98–111)
Creatinine, Ser: 1.09 mg/dL (ref 0.61–1.24)
GFR calc Af Amer: >60 mL/min (ref >60)
GFR calc non Af Amer: >60 mL/min (ref >60)
Glucose, Bld: 562 mg/dL (ref 70–99)
Potassium: 5 mmol/L (ref 3.5–5.1)
Sodium: 125 mmol/L — ABNORMAL LOW (ref 135–145)

## 2019-08-11 MED ORDER — INSULIN ASPART 100 UNIT/ML ~~LOC~~ SOLN
6.0000 [IU] | Freq: Once | SUBCUTANEOUS | Status: AC
  Administered 2019-08-11: 6 [IU] via INTRAVENOUS
  Filled 2019-08-11: qty 1

## 2019-08-11 MED ORDER — INSULIN ASPART 100 UNIT/ML ~~LOC~~ SOLN
8.0000 [IU] | Freq: Once | SUBCUTANEOUS | Status: AC
  Administered 2019-08-11: 8 [IU] via INTRAVENOUS
  Filled 2019-08-11: qty 1

## 2019-08-11 MED ORDER — INSULIN ASPART 100 UNIT/ML ~~LOC~~ SOLN
5.0000 [IU] | Freq: Once | SUBCUTANEOUS | Status: AC
  Administered 2019-08-11: 22:00:00 5 [IU] via INTRAVENOUS
  Filled 2019-08-11: qty 1

## 2019-08-11 MED ORDER — SODIUM CHLORIDE 0.9 % IV BOLUS
1000.0000 mL | Freq: Once | INTRAVENOUS | Status: AC
  Administered 2019-08-11: 1000 mL via INTRAVENOUS

## 2019-08-11 MED ORDER — BLOOD GLUCOSE MONITOR KIT: PACK | 0 refills | Status: DC

## 2019-08-11 NOTE — Telephone Encounter (Signed)
Attempted to contact patient to advise him to go to ED due to critical glucose value. No answer and unable to leave a message. Will keep trying.

## 2019-08-11 NOTE — ED Triage Notes (Signed)
Pt sent to ER by PCP for elevated glucose of 499 drawn this am. Pt denies any nausea, or vomiting. Pt states "I feel great"

## 2019-08-11 NOTE — Telephone Encounter (Signed)
Attempted to contact patient to advise him to go to the ED due to critical value of glucose per provider. No answer and unable to leave a message. Will keep trying.

## 2019-08-11 NOTE — ED Provider Notes (Signed)
[facility] Provider Note   CSN: [id number] Arrival date & time: 08/11/19  1725     History Chief Complaint  Patient presents with  . Hyperglycemia    Stanley Levy is a 64 y.o. male past medical history of diabetes, anxiety, hyperlipidemia who presents for evaluation of hyperglycemia.  Patient reports that he was at his doctor for a follow-up appointment.  They called him and told him his blood sugar was high and that he needed to come to the emergency department.  He states he is not having any complaints or symptoms at this time.  He reports that he was diagnosed with diabetes several years ago and had been on medication for several years.  He states that he had controlled his blood sugar and so they took him off medication he has not been on it for several years.  He was admitted last week for confusion.  At that time, was felt to be due to hyperglycemia.  He had an A1c of 18.5%.  He had been recommended to start on Novolin 70/30 insulin, 12 units twice daily and to monitor his blood sugar.  Patient states he has not been taking any medication.  He denies any confusion, chest pain, difficulty breathing, abdominal pain, nausea/vomiting.  The history is provided by the patient.       Past Medical History:  Diagnosis Date  . Anxiety   . Diabetes mellitus, type II ([place])   . Hyperlipidemia   . Thyroid disease     Patient Active Problem List   Diagnosis Date Noted  . Hyperglycemia due to diabetes mellitus ([place]) 08/01/2019  . Weakness 08/01/2019  . Subacute confusional state 08/01/2019  . Anxiety disorder 11/25/2011    Past Surgical History:  Procedure Laterality Date  . FOOT SURGERY         Family History  Problem Relation Age of Onset  . Anxiety disorder Neg Hx   . Dementia Neg Hx   . Alcohol abuse Neg Hx   . Drug abuse Neg Hx   . ADD / ADHD Neg Hx   . Bipolar disorder Neg Hx   . Depression Neg Hx   . OCD Neg Hx   . Paranoid behavior Neg Hx     . Physical abuse Neg Hx   . Schizophrenia Neg Hx   . Seizures Neg Hx   . Sexual abuse Neg Hx     Social History   Tobacco Use  . Smoking status: Never Smoker  . Smokeless tobacco: Never Used  Substance Use Topics  . Alcohol use: No  . Drug use: No    Home Medications Prior to Admission medications   Medication Sig Start Date End Date Taking? Authorizing Provider  blood glucose meter kit and supplies KIT Dispense based on patient and insurance preference. Use up to four times daily as directed. (FOR ICD-9 250.00, 250.01). 08/11/19   [place]idence Lanius A, PA-C  blood glucose meter kit and supplies Dispense based on patient and insurance preference. Use up to four times daily as directed. (FOR ICD-10 E10.9, E11.9). 08/02/19   Manuella Ghazi, Pratik D, DO  gabapentin (NEURONTIN) 100 MG capsule Take 1 capsule (100 mg total) by mouth 3 (three) times daily. 08/02/19 08/01/20  Manuella Ghazi, Pratik D, DO  imipramine (TOFRANIL) 50 MG tablet Take 1 tablet (50 mg total) by mouth at bedtime. 12/01/18   Cloria Spring, MD  insulin aspart protamine - aspart (NOVOLOG MIX 70/30 FLEXPEN) (70-30) 100 UNIT/ML FlexPen Inject  0.12 mLs (12 Units total) into the skin 2 (two) times daily with a meal. 08/02/19   Manuella Ghazi, Pratik D, DO  Insulin Pen Needle 31G X 5 MM MISC 1 Units by Does not apply route 2 (two) times daily. 08/02/19   Manuella Ghazi, Pratik D, DO  Lancets (ONETOUCH DELICA PLUS URKYHC62B) [place] 1 each by Does not apply route in the morning, at noon, in the evening, and at bedtime. 08/03/19   [provider]    Allergies    Patient has no known allergies.  Review of Systems   Review of Systems  Constitutional: Negative for fever.  Respiratory: Negative for cough and shortness of breath.   Cardiovascular: Negative for chest pain.  Gastrointestinal: Negative for abdominal pain, nausea and vomiting.  Genitourinary: Negative for dysuria and hematuria.  Neurological: Negative for headaches.  All other systems reviewed and are  negative.   Physical Exam Updated Vital Signs BP 135/60   Pulse 90   Temp (!) 97.4 F (36.3 C) (Oral)   Resp 19   Ht 5' 9"  (1.753 m)   Wt 93 kg   SpO2 99%   BMI 30.27 kg/m   Physical Exam Vitals and nursing note reviewed.  Constitutional:      Appearance: Normal appearance. He is well-developed.  HENT:     Head: Normocephalic and atraumatic.  Eyes:     General: Lids are normal.     Conjunctiva/sclera: Conjunctivae normal.     Pupils: Pupils are equal, round, and reactive to light.  Cardiovascular:     Rate and Rhythm: Normal rate and regular rhythm.     Pulses: Normal pulses.     Heart sounds: Normal heart sounds. No murmur. No friction rub. No gallop.   Pulmonary:     Effort: Pulmonary effort is normal.     Breath sounds: Normal breath sounds.  Abdominal:     Palpations: Abdomen is soft. Abdomen is not rigid.     Tenderness: There is no abdominal tenderness. There is no guarding.     Comments: Abdomen is soft, non-distended, non-tender. No rigidity, No guarding. No peritoneal signs.  Musculoskeletal:        General: Normal range of motion.     Cervical back: Full passive range of motion without pain.  Skin:    General: Skin is warm and dry.     Capillary Refill: Capillary refill takes less than 2 seconds.  Neurological:     Mental Status: He is alert and oriented to person, place, and time.     Comments: Alert and oriented x3 Answers questions appropriated.   Psychiatric:        Speech: Speech normal.     ED Results / Procedures / Treatments   Labs (all labs ordered are listed, but only abnormal results are displayed) Labs Reviewed  BASIC METABOLIC PANEL - Abnormal; Notable for the following components:      Result Value   Sodium 125 (*)    Chloride 91 (*)    Glucose, Bld 562 (*)    BUN 24 (*)    Calcium 8.6 (*)    All other components within normal limits  URINALYSIS, ROUTINE W REFLEX MICROSCOPIC - Abnormal; Notable for the following components:    Glucose, UA >=500 (*)    All other components within normal limits  CBG MONITORING, ED - Abnormal; Notable for the following components:   Glucose-Capillary 515 (*)    All other components within normal limits  CBG MONITORING, ED -  Abnormal; Notable for the following components:   Glucose-Capillary 487 (*)    All other components within normal limits  CBC  CBG MONITORING, ED  CBG MONITORING, ED    EKG None  Radiology No results found.  Procedures Procedures (including critical care time)  Medications Ordered in ED Medications  sodium chloride 0.9 % bolus 1,000 mL (0 mLs Intravenous Stopped 08/11/19 2043)  insulin aspart (novoLOG) injection 8 Units (8 Units Intravenous Given 08/11/19 1848)  insulin aspart (novoLOG) injection 6 Units (6 Units Intravenous Given 08/11/19 2102)    ED Course  I have reviewed the triage vital signs and the nursing notes.  Pertinent labs & imaging results that were available during my care of the patient were reviewed by me and considered in my medical decision making (see chart for details).    MDM Rules/Calculators/A&P                      64 year old male who presents for evaluation of hyperglycemia.  Admitted last week for confusion was felt to be due to hyperglycemia.  A1c was 18.5%.  Went to primary care doctor today for follow-up and was noted to have high blood sugar and was sent to the ED for further evaluation.  He denies any complaints at this time.  On initial ED arrival, he is afebrile, nontoxic-appearing.  Vital signs are stable.  Plan to check labs.   Initial CBG is 515.  CBC shows no leukocytosis or anemia.  BMP shows sodium of 125, bicarb 22, glucose of 562.  Anion gap is 12.  His corrected sodium is 132.  UA shows no ketones.  At this time, his work-up is not concerning for DKA but rather hyperglycemia.  I suspect this is in part due to the fact that he has not been taking any medication.  We will plan to give him insulin, fluids and  recheck his blood sugar.  Blood sugar is 487.  Will give additional insulin.  At this time, patient does not appear confused.  He is able to answer all questions appropriately.  He does not appear acutely ill at this time and is tolerating p.o. without any difficulty.  At this time, his work-up is not consistent with DKA.  Patient told me he had not been taking the insulin because he did not have any glucometer strips.  I will provide him with a prescription for glucometer. At this time, patient exhibits no emergent life-threatening condition that require further evaluation in ED or admission. Patient had ample opportunity for questions and discussion. All patient's questions were answered with full understanding. Strict return precautions discussed. Patient expresses understanding and agreement to plan.   Portions of this note were generated with Lobby[employer]. Dictation errors may occur despite best attempts at proofreading.  Final Clinical Impression(s) / ED Diagnoses Final diagnoses:  Hyperglycemia    Rx / DC Orders ED Discharge Orders         Ordered    blood glucose meter kit and supplies KIT     08/11/19 2117           Volanda Napoleon, PA-C 08/12/19 1112    Milton Ferguson, MD 08/12/19 1506

## 2019-08-11 NOTE — ED Notes (Signed)
Pt calling for ride. Pt rec'd d/c papers. IV out. Pt understood instructions.

## 2019-08-11 NOTE — ED Notes (Signed)
Unable to reach spouse on phone

## 2019-08-11 NOTE — Discharge Instructions (Signed)
As we discussed is very important you take your insulin as directed.  Follow-up with your primary care doctor.  Return the emergency department for any vomiting, abdominal pain, difficulty breathing or any other worsening or concerning symptoms.

## 2019-08-11 NOTE — ED Notes (Signed)
Pt waiting in room for ride

## 2019-08-11 NOTE — Telephone Encounter (Signed)
Attempted to contact patient on both numbers listed in chart to discuss critical glucose value. No answer on either number and unable to leave vm

## 2019-08-11 NOTE — ED Notes (Signed)
Date and time results received: 08/11/19 1842 (use smartphrase ".now" to insert current time)  Test: Blood glucose Critical Value: 562  Name of Provider Notified: Gwendalyn Ege PA  Orders Received? Or Actions Taken?: new orders in place

## 2019-08-12 ENCOUNTER — Other Ambulatory Visit: Payer: Self-pay | Admitting: Family Medicine

## 2019-08-12 ENCOUNTER — Encounter: Payer: Self-pay | Admitting: Family Medicine

## 2019-08-12 ENCOUNTER — Other Ambulatory Visit: Payer: Self-pay

## 2019-08-12 ENCOUNTER — Ambulatory Visit (INDEPENDENT_AMBULATORY_CARE_PROVIDER_SITE_OTHER): Payer: PPO | Admitting: Family Medicine

## 2019-08-12 VITALS — BP 135/70 | Ht 69.0 in | Wt 205.0 lb

## 2019-08-12 DIAGNOSIS — E559 Vitamin D deficiency, unspecified: Secondary | ICD-10-CM

## 2019-08-12 DIAGNOSIS — I10 Essential (primary) hypertension: Secondary | ICD-10-CM

## 2019-08-12 DIAGNOSIS — Z79899 Other long term (current) drug therapy: Secondary | ICD-10-CM | POA: Diagnosis not present

## 2019-08-12 DIAGNOSIS — E1165 Type 2 diabetes mellitus with hyperglycemia: Secondary | ICD-10-CM

## 2019-08-12 DIAGNOSIS — R7989 Other specified abnormal findings of blood chemistry: Secondary | ICD-10-CM

## 2019-08-12 DIAGNOSIS — E781 Pure hyperglyceridemia: Secondary | ICD-10-CM

## 2019-08-12 LAB — COMPLETE METABOLIC PANEL WITH GFR
AG Ratio: 1.4 (calc) (ref 1.0–2.5)
ALT: 68 U/L — ABNORMAL HIGH (ref 9–46)
AST: 37 U/L — ABNORMAL HIGH (ref 10–35)
Albumin: 3.4 g/dL — ABNORMAL LOW (ref 3.6–5.1)
Alkaline phosphatase (APISO): 490 U/L — ABNORMAL HIGH (ref 35–144)
BUN: 16 mg/dL (ref 7–25)
CO2: 25 mmol/L (ref 20–32)
Calcium: 8.8 mg/dL (ref 8.6–10.3)
Chloride: 96 mmol/L — ABNORMAL LOW (ref 98–110)
Creat: 1.01 mg/dL (ref 0.70–1.25)
GFR, Est African American: 90 mL/min/{1.73_m2} (ref >=60)
GFR, Est Non African American: 78 mL/min/{1.73_m2} (ref >=60)
Globulin: 2.5 g/dL (calc) (ref 1.9–3.7)
Glucose, Bld: 499 mg/dL — ABNORMAL HIGH (ref 65–99)
Potassium: 5.1 mmol/L (ref 3.5–5.3)
Sodium: 132 mmol/L — ABNORMAL LOW (ref 135–146)
Total Bilirubin: 1.1 mg/dL (ref 0.2–1.2)
Total Protein: 5.9 g/dL — ABNORMAL LOW (ref 6.1–8.1)

## 2019-08-12 LAB — CBC
HCT: 45.2 % (ref 38.5–50.0)
Hemoglobin: 14.8 g/dL (ref 13.2–17.1)
MCH: 31.7 pg (ref 27.0–33.0)
MCHC: 32.7 g/dL (ref 32.0–36.0)
MCV: 96.8 fL (ref 80.0–100.0)
MPV: 11.7 fL (ref 7.5–12.5)
Platelets: 163 10*3/uL (ref 140–400)
RBC: 4.67 10*6/uL (ref 4.20–5.80)
RDW: 13.3 % (ref 11.0–15.0)
WBC: 7.4 10*3/uL (ref 3.8–10.8)

## 2019-08-12 LAB — VITAMIN B12: Vitamin B-12: 1712 pg/mL — ABNORMAL HIGH (ref 200–1100)

## 2019-08-12 LAB — MICROALBUMIN / CREATININE URINE RATIO
Creatinine, Urine: 32 mg/dL (ref 20–320)
Microalb Creat Ratio: 138 mcg/mg creat — ABNORMAL HIGH (ref <30)
Microalb, Ur: 4.4 mg/dL

## 2019-08-12 LAB — LIPID PANEL
Cholesterol: 168 mg/dL (ref <200)
HDL: 56 mg/dL (ref >=40)
LDL Cholesterol (Calc): 84 mg/dL (calc)
Non-HDL Cholesterol (Calc): 112 mg/dL (calc) (ref <130)
Total CHOL/HDL Ratio: 3 (calc) (ref <5.0)
Triglycerides: 185 mg/dL — ABNORMAL HIGH (ref <150)

## 2019-08-12 LAB — THYROID PANEL WITH TSH
Free Thyroxine Index: 1.5 (ref 1.4–3.8)
T3 Uptake: 39 % — ABNORMAL HIGH (ref 22–35)
T4, Total: 3.8 ug/dL — ABNORMAL LOW (ref 4.9–10.5)
TSH: 18.45 mIU/L — ABNORMAL HIGH (ref 0.40–4.50)

## 2019-08-12 LAB — HEP C AB W/REFL
HEPATITIS C ANTIBODY REFILL$(REFL): NONREACTIVE
SIGNAL TO CUT-OFF: 0.02 (ref <1.00)

## 2019-08-12 LAB — VITAMIN D 25 HYDROXY (VIT D DEFICIENCY, FRACTURES): Vit D, 25-Hydroxy: 18 ng/mL — ABNORMAL LOW (ref 30–100)

## 2019-08-12 LAB — REFLEX TIQ

## 2019-08-12 MED ORDER — NOVOLOG MIX 70/30 FLEXPEN (70-30) 100 UNIT/ML ~~LOC~~ SUPN: 25.0000 [IU] | PEN_INJECTOR | Freq: Two times a day (BID) | SUBCUTANEOUS | 11 refills | Status: DC

## 2019-08-12 MED ORDER — VITAMIN D (ERGOCALCIFEROL) 1.25 MG (50000 UNIT) PO CAPS: 50000.0000 [IU] | ORAL_CAPSULE | ORAL | 2 refills | Status: AC

## 2019-08-12 MED ORDER — ROSUVASTATIN CALCIUM 5 MG PO TABS: 5.0000 mg | ORAL_TABLET | Freq: Every day | ORAL | 3 refills | Status: DC

## 2019-08-12 MED ORDER — LEVOTHYROXINE SODIUM 25 MCG PO TABS: 25.0000 ug | ORAL_TABLET | Freq: Every day | ORAL | 1 refills | Status: DC

## 2019-08-12 MED ORDER — GLUCOSE METER TEST VI STRP: ORAL_STRIP | 3 refills | Status: DC

## 2019-08-12 MED ORDER — LISINOPRIL 10 MG PO TABS: 10.0000 mg | ORAL_TABLET | Freq: Every day | ORAL | 3 refills | Status: DC

## 2019-08-12 NOTE — Assessment & Plan Note (Signed)
Thorough detail of med teaching.  Including insulin administration.  Review of blood sugar monitoring.  In review of all medications recently prescribed in the last day. 25 mins of med teaching in all

## 2019-08-12 NOTE — Patient Instructions (Addendum)
Happy New Year! May you have a year filled with hope, love, happiness and laughter.  I appreciate the opportunity to provide you with care for your health and wellness. Today we discussed: how to give injections and new medications  Follow up: Next week to get blood sugar numbers and make sure doing okay with medications  No labs or referrals today  If blood sugar is under 70 call me.  If blood sugar is 100 or lower at bedtime HOLD 25 units and eat a snack before bed.  Keep record of blood sugars for me to review, bring in on Wednesday.  Take all medications has directed on bottles-if you have questions please call.  Please continue to practice social distancing to keep you, your family, and our community safe.  If you must go out, please wear a mask and practice good handwashing.  It was a pleasure to see you and I look forward to continuing to work together on your health and well-being. Please do not hesitate to call the office if you need care or have questions about your care.  Have a wonderful day and week. With Gratitude, Tereasa Coop, DNP, AGNP-BC

## 2019-08-12 NOTE — Assessment & Plan Note (Signed)
Was not taking his insulin.   Extensive education on insulin administration.  Blood sugar checking.  In review of all medications recently prescribed.  Close follow-up with Wednesday appointment.  In addition to parameters added and understood verbally provided by wife and him.  Advised for them to call if there are any issues.

## 2019-08-12 NOTE — Progress Notes (Signed)
Subjective:  Patient ID: Stanley Levy, male    DOB: 12/08/1955  Age: 64 y.o. MRN: [id number]  CC: No chief complaint on file.     HPI  HPI  I sent him to the hospital last evening due to elevated blood sugar on labs. He was treated and released home.  Arrived to the office today to get help learning how to give insulin injections. Reports he has not taken it since he was provided it. Also brought other medications into the office to review in detail.  Today patient denies signs and symptoms of COVID 19 infection including fever, chills, cough, shortness of breath, and headache. Past Medical, Surgical, Social History, Allergies, and Medications have been Reviewed.   Past Medical History:  Diagnosis Date  . Anxiety   . Diabetes mellitus, type II ([facility])   . Hyperlipidemia   . Thyroid disease     No outpatient medications have been marked as taking for the 08/12/19 encounter (Office Visit) with Freddy Finner, NP.    ROS:  Review of Systems  Constitutional: Negative.   HENT: Negative.   Eyes: Negative.   Respiratory: Negative.   Cardiovascular: Negative.   Gastrointestinal: Negative.   Genitourinary: Negative.   Musculoskeletal: Negative.   Skin: Negative.   Neurological: Negative.   Endo/Heme/Allergies: Negative.   Psychiatric/Behavioral: Negative.   All other systems reviewed and are negative.    Objective:   Today's Vitals: There were no vitals taken for this visit. Vitals with BMI 08/11/2019 08/11/2019 08/11/2019  Height - - -  Weight - - -  BMI - - -  Systolic 135 134 497  Diastolic 60 95 86  Pulse - - 90  Some encounter information is confidential and restricted. Go to Review Flowsheets activity to see all data.     Physical Exam Vitals and nursing note reviewed.  Constitutional:      Appearance: Normal appearance. He is well-developed and well-groomed. He is obese.  HENT:     Head: Normocephalic and atraumatic.     Comments: Mask in place     Right Ear: External ear normal.     Left Ear: External ear normal.  Eyes:     General:        Right eye: No discharge.        Left eye: No discharge.     Conjunctiva/sclera: Conjunctivae normal.  Cardiovascular:     Rate and Rhythm: Normal rate and regular rhythm.     Pulses: Normal pulses.     Heart sounds: Normal heart sounds.  Pulmonary:     Effort: Pulmonary effort is normal.     Breath sounds: Normal breath sounds.  Musculoskeletal:        General: Normal range of motion.     Cervical back: Normal range of motion and neck supple.     Comments: Cane   Skin:    General: Skin is warm.  Neurological:     General: No focal deficit present.     Mental Status: He is alert and oriented to person, place, and time.  Psychiatric:        Attention and Perception: Attention normal.        Mood and Affect: Mood normal.        Speech: Speech normal.        Behavior: Behavior normal. Behavior is cooperative.        Thought Content: Thought content normal.        Cognition  and Memory: Cognition normal.        Judgment: Judgment normal.     Assessment   1. Encounter for medication management   2. Hyperglycemia due to diabetes mellitus ([place])     Tests ordered No orders of the defined types were placed in this encounter.   Plan: Please see assessment and plan per problem list above.   No orders of the defined types were placed in this encounter.  25 minutes of education provided today with insulin teaching involved   Patient to follow-up in 08/17/2019   Perlie Mayo, NP

## 2019-08-12 NOTE — Telephone Encounter (Signed)
Late entry: spoke with patients spouse and advised her that patient needed to go to the ED for treatment due to critical glucose value. She verbalized understanding and stated she would get him to the ED.

## 2019-08-15 ENCOUNTER — Emergency Department (HOSPITAL_COMMUNITY): Payer: PPO

## 2019-08-15 ENCOUNTER — Encounter (HOSPITAL_COMMUNITY): Payer: Self-pay | Admitting: *Deleted

## 2019-08-15 ENCOUNTER — Inpatient Hospital Stay (HOSPITAL_COMMUNITY)
Admission: EM | Admit: 2019-08-15 | Discharge: 2019-08-23 | DRG: 292 | Disposition: A | Discharge status: Home Health | Payer: PPO | Attending: Family Medicine | Admitting: Family Medicine

## 2019-08-15 ENCOUNTER — Other Ambulatory Visit: Payer: Self-pay

## 2019-08-15 DIAGNOSIS — I429 Cardiomyopathy, unspecified: Secondary | ICD-10-CM | POA: Diagnosis not present

## 2019-08-15 DIAGNOSIS — R338 Other retention of urine: Secondary | ICD-10-CM | POA: Diagnosis not present

## 2019-08-15 DIAGNOSIS — Z7189 Other specified counseling: Secondary | ICD-10-CM | POA: Diagnosis not present

## 2019-08-15 DIAGNOSIS — I5189 Other ill-defined heart diseases: Secondary | ICD-10-CM

## 2019-08-15 DIAGNOSIS — I513 Intracardiac thrombosis, not elsewhere classified: Secondary | ICD-10-CM | POA: Diagnosis not present

## 2019-08-15 DIAGNOSIS — E785 Hyperlipidemia, unspecified: Secondary | ICD-10-CM | POA: Diagnosis present

## 2019-08-15 DIAGNOSIS — Z7982 Long term (current) use of aspirin: Secondary | ICD-10-CM | POA: Diagnosis not present

## 2019-08-15 DIAGNOSIS — I471 Supraventricular tachycardia: Secondary | ICD-10-CM | POA: Diagnosis not present

## 2019-08-15 DIAGNOSIS — E782 Mixed hyperlipidemia: Secondary | ICD-10-CM

## 2019-08-15 DIAGNOSIS — I361 Nonrheumatic tricuspid (valve) insufficiency: Secondary | ICD-10-CM | POA: Diagnosis not present

## 2019-08-15 DIAGNOSIS — Z20822 Contact with and (suspected) exposure to covid-19: Secondary | ICD-10-CM | POA: Diagnosis present

## 2019-08-15 DIAGNOSIS — I5023 Acute on chronic systolic (congestive) heart failure: Secondary | ICD-10-CM | POA: Diagnosis not present

## 2019-08-15 DIAGNOSIS — I5020 Unspecified systolic (congestive) heart failure: Secondary | ICD-10-CM | POA: Diagnosis not present

## 2019-08-15 DIAGNOSIS — K828 Other specified diseases of gallbladder: Secondary | ICD-10-CM | POA: Diagnosis not present

## 2019-08-15 DIAGNOSIS — R296 Repeated falls: Secondary | ICD-10-CM | POA: Diagnosis not present

## 2019-08-15 DIAGNOSIS — R4189 Other symptoms and signs involving cognitive functions and awareness: Secondary | ICD-10-CM | POA: Diagnosis present

## 2019-08-15 DIAGNOSIS — R0600 Dyspnea, unspecified: Secondary | ICD-10-CM | POA: Diagnosis not present

## 2019-08-15 DIAGNOSIS — Z79890 Hormone replacement therapy (postmenopausal): Secondary | ICD-10-CM

## 2019-08-15 DIAGNOSIS — I110 Hypertensive heart disease with heart failure: Principal | ICD-10-CM | POA: Diagnosis present

## 2019-08-15 DIAGNOSIS — I519 Heart disease, unspecified: Secondary | ICD-10-CM | POA: Diagnosis not present

## 2019-08-15 DIAGNOSIS — R0602 Shortness of breath: Secondary | ICD-10-CM | POA: Diagnosis not present

## 2019-08-15 DIAGNOSIS — N401 Enlarged prostate with lower urinary tract symptoms: Secondary | ICD-10-CM | POA: Diagnosis present

## 2019-08-15 DIAGNOSIS — Z0000 Encounter for general adult medical examination without abnormal findings: Secondary | ICD-10-CM

## 2019-08-15 DIAGNOSIS — E781 Pure hyperglyceridemia: Secondary | ICD-10-CM

## 2019-08-15 DIAGNOSIS — Z66 Do not resuscitate: Secondary | ICD-10-CM | POA: Diagnosis present

## 2019-08-15 DIAGNOSIS — Z8673 Personal history of transient ischemic attack (TIA), and cerebral infarction without residual deficits: Secondary | ICD-10-CM

## 2019-08-15 DIAGNOSIS — R0609 Other forms of dyspnea: Secondary | ICD-10-CM

## 2019-08-15 DIAGNOSIS — Z515 Encounter for palliative care: Secondary | ICD-10-CM

## 2019-08-15 DIAGNOSIS — I10 Essential (primary) hypertension: Secondary | ICD-10-CM | POA: Diagnosis not present

## 2019-08-15 DIAGNOSIS — I959 Hypotension, unspecified: Secondary | ICD-10-CM | POA: Diagnosis not present

## 2019-08-15 DIAGNOSIS — Z79899 Other long term (current) drug therapy: Secondary | ICD-10-CM | POA: Diagnosis not present

## 2019-08-15 DIAGNOSIS — R7989 Other specified abnormal findings of blood chemistry: Secondary | ICD-10-CM

## 2019-08-15 DIAGNOSIS — R188 Other ascites: Secondary | ICD-10-CM | POA: Diagnosis not present

## 2019-08-15 DIAGNOSIS — E1165 Type 2 diabetes mellitus with hyperglycemia: Secondary | ICD-10-CM | POA: Diagnosis not present

## 2019-08-15 DIAGNOSIS — I509 Heart failure, unspecified: Secondary | ICD-10-CM | POA: Diagnosis not present

## 2019-08-15 DIAGNOSIS — Z794 Long term (current) use of insulin: Secondary | ICD-10-CM | POA: Diagnosis not present

## 2019-08-15 DIAGNOSIS — E039 Hypothyroidism, unspecified: Secondary | ICD-10-CM | POA: Diagnosis present

## 2019-08-15 DIAGNOSIS — I5043 Acute on chronic combined systolic (congestive) and diastolic (congestive) heart failure: Secondary | ICD-10-CM | POA: Diagnosis present

## 2019-08-15 DIAGNOSIS — I5021 Acute systolic (congestive) heart failure: Secondary | ICD-10-CM | POA: Diagnosis not present

## 2019-08-15 LAB — CBC WITH DIFFERENTIAL/PLATELET
Abs Immature Granulocytes: 0.06 10*3/uL (ref 0.00–0.07)
Basophils Absolute: 0 10*3/uL (ref 0.0–0.1)
Basophils Relative: 0 %
Eosinophils Absolute: 0 10*3/uL (ref 0.0–0.5)
Eosinophils Relative: 0 %
HCT: 42.7 % (ref 39.0–52.0)
Hemoglobin: 13.8 g/dL (ref 13.0–17.0)
Immature Granulocytes: 1 %
Lymphocytes Relative: 16 %
Lymphs Abs: 1.5 10*3/uL (ref 0.7–4.0)
MCH: 32.1 pg (ref 26.0–34.0)
MCHC: 32.3 g/dL (ref 30.0–36.0)
MCV: 99.3 fL (ref 80.0–100.0)
Monocytes Absolute: 0.8 10*3/uL (ref 0.1–1.0)
Monocytes Relative: 8 %
Neutro Abs: 7.1 10*3/uL (ref 1.7–7.7)
Neutrophils Relative %: 75 %
Platelets: 157 10*3/uL (ref 150–400)
RBC: 4.3 MIL/uL (ref 4.22–5.81)
RDW: 15.8 % — ABNORMAL HIGH (ref 11.5–15.5)
WBC: 9.4 10*3/uL (ref 4.0–10.5)
nRBC: 0 % (ref 0.0–0.2)

## 2019-08-15 LAB — COMPREHENSIVE METABOLIC PANEL
ALT: 69 U/L — ABNORMAL HIGH (ref 0–44)
AST: 45 U/L — ABNORMAL HIGH (ref 15–41)
Albumin: 3.1 g/dL — ABNORMAL LOW (ref 3.5–5.0)
Alkaline Phosphatase: 422 U/L — ABNORMAL HIGH (ref 38–126)
Anion gap: 9 (ref 5–15)
BUN: 18 mg/dL (ref 8–23)
CO2: 22 mmol/L (ref 22–32)
Calcium: 8.7 mg/dL — ABNORMAL LOW (ref 8.9–10.3)
Chloride: 100 mmol/L (ref 98–111)
Creatinine, Ser: 1.11 mg/dL (ref 0.61–1.24)
GFR calc Af Amer: >60 mL/min (ref >60)
GFR calc non Af Amer: >60 mL/min (ref >60)
Glucose, Bld: 415 mg/dL — ABNORMAL HIGH (ref 70–99)
Potassium: 5 mmol/L (ref 3.5–5.1)
Sodium: 131 mmol/L — ABNORMAL LOW (ref 135–145)
Total Bilirubin: 1.9 mg/dL — ABNORMAL HIGH (ref 0.3–1.2)
Total Protein: 6.1 g/dL — ABNORMAL LOW (ref 6.5–8.1)

## 2019-08-15 LAB — POC SARS CORONAVIRUS 2 AG -  ED: SARS Coronavirus 2 Ag: NEGATIVE

## 2019-08-15 LAB — RESPIRATORY PANEL BY RT PCR (FLU A&B, COVID)
Influenza A by PCR: NEGATIVE
Influenza B by PCR: NEGATIVE
SARS Coronavirus 2 by RT PCR: NEGATIVE

## 2019-08-15 LAB — BRAIN NATRIURETIC PEPTIDE: B Natriuretic Peptide: 1165 pg/mL — ABNORMAL HIGH (ref 0.0–100.0)

## 2019-08-15 LAB — TROPONIN I (HIGH SENSITIVITY)
Troponin I (High Sensitivity): 23 ng/L — ABNORMAL HIGH (ref <18)
Troponin I (High Sensitivity): 21 ng/L — ABNORMAL HIGH (ref <18)

## 2019-08-15 LAB — D-DIMER, QUANTITATIVE: D-Dimer, Quant: 1.6 ug/mL-FEU — ABNORMAL HIGH (ref 0.00–0.50)

## 2019-08-15 MED ORDER — IOHEXOL 350 MG/ML SOLN
100.0000 mL | Freq: Once | INTRAVENOUS | Status: AC | PRN
  Administered 2019-08-15: 22:00:00 100 mL via INTRAVENOUS

## 2019-08-15 MED ORDER — INSULIN ASPART 100 UNIT/ML ~~LOC~~ SOLN
5.0000 [IU] | Freq: Once | SUBCUTANEOUS | Status: AC
  Administered 2019-08-15: 21:00:00 5 [IU] via INTRAVENOUS

## 2019-08-15 MED ORDER — FUROSEMIDE 10 MG/ML IJ SOLN
40.0000 mg | Freq: Once | INTRAMUSCULAR | Status: AC
  Administered 2019-08-15: 40 mg via INTRAVENOUS
  Filled 2019-08-15: qty 4

## 2019-08-15 NOTE — ED Provider Notes (Signed)
Medical screening examination/treatment/procedure(s) were conducted as a shared visit with non-physician practitioner(s) and myself.  I personally evaluated the patient during the encounter.  EKG Interpretation  Date/Time:  Monday August 15 2019 19:37:58 EST Ventricular Rate:  101 PR Interval:    QRS Duration: 95 QT Interval:  353 QTC Calculation: 458 R Axis:   73 Text Interpretation: Sinus tachycardia Anterior infarct, old Minimal ST depression Baseline wander in lead(s) II III aVR aVF Confirmed by Bethann Berkshire [id number]) on 08/15/2019 9:33:50 PM  Patient with shortness of breath.  Physical exam patient alert oriented x4 in no acute distress.  He is 3+ pitting edema of both legs   Bethann Berkshire, MD 08/15/19 2235

## 2019-08-15 NOTE — ED Notes (Signed)
Lab in room.

## 2019-08-15 NOTE — ED Notes (Signed)
Ambulated pt 02 level started out at 97%,done a few laps around room 02 level ended at 100%the patient steady on feet

## 2019-08-15 NOTE — ED Triage Notes (Signed)
C/o swelling all over with shortness of breath. States he has been unable to take his meds for the past 2 days due to lack of power.

## 2019-08-15 NOTE — ED Provider Notes (Signed)
[facility] EMERGENCY DEPARTMENT Provider Note   CSN: [id number] Arrival date & time: 08/15/19  1738     History Chief Complaint  Patient presents with  . Shortness of Breath    SARKIS RHINES is a 64 y.o. male with a history of type 2 diabetes mellitus, anxiety, and hyperlipidemia who presents to the emergency department with complaints of shortness of breath which has been progressively worsening over the past few weeks.  Patient states he feels constantly short of breath, worse with exertion, no alleviating factors.  Reports associated bilateral lower extremity swelling.  States today symptoms seem worse prompting ER visit.  He denies fever, chills, cough, nausea, vomiting, diaphoresis, hemoptysis, prior VTE, prior cancer, or recent travel/surgery.  Did have recent hospitalization.  Patient denies prior history of heart failure or CAD.  He states he has not had a previous echocardiogram or stress test.  HPI     Past Medical History:  Diagnosis Date  . Anxiety   . Diabetes mellitus, type II ([place])   . Hyperlipidemia   . Thyroid disease     Patient Active Problem List   Diagnosis Date Noted  . Encounter for medication management 08/12/2019  . Hyperglycemia due to diabetes mellitus ([place]) 08/01/2019  . Weakness 08/01/2019  . Subacute confusional state 08/01/2019  . Anxiety disorder 11/25/2011    Past Surgical History:  Procedure Laterality Date  . FOOT SURGERY         Family History  Problem Relation Age of Onset  . Anxiety disorder Neg Hx   . Dementia Neg Hx   . Alcohol abuse Neg Hx   . Drug abuse Neg Hx   . ADD / ADHD Neg Hx   . Bipolar disorder Neg Hx   . Depression Neg Hx   . OCD Neg Hx   . Paranoid behavior Neg Hx   . Physical abuse Neg Hx   . Schizophrenia Neg Hx   . Seizures Neg Hx   . Sexual abuse Neg Hx     Social History   Tobacco Use  . Smoking status: Never Smoker  . Smokeless tobacco: Never Used  Substance Use Topics  . Alcohol use: No  .  Drug use: No    Home Medications Prior to Admission medications   Medication Sig Start Date End Date Taking? Authorizing Provider  blood glucose meter kit and supplies KIT Dispense based on patient and insurance preference. Use up to four times daily as directed. (FOR ICD-9 250.00, 250.01). 08/11/19   [facility] Lanius A, PA-C  blood glucose meter kit and supplies Dispense based on patient and insurance preference. Use up to four times daily as directed. (FOR ICD-10 E10.9, E11.9). 08/02/19   Manuella Ghazi, Pratik D, DO  gabapentin (NEURONTIN) 100 MG capsule Take 1 capsule (100 mg total) by mouth 3 (three) times daily. 08/02/19 08/01/20  Heath Lark D, DO  glucose blood (GLUCOSE METER TEST) test strip Use as instructed 08/12/19   Perlie Mayo, NP  imipramine (TOFRANIL) 50 MG tablet Take 1 tablet (50 mg total) by mouth at bedtime. 12/01/18   Cloria Spring, MD  insulin aspart protamine - aspart (NOVOLOG MIX 70/30 FLEXPEN) (70-30) 100 UNIT/ML FlexPen Inject 0.25 mLs (25 Units total) into the skin 2 (two) times daily with a meal. 08/12/19   Perlie Mayo, NP  Insulin Pen Needle 31G X 5 MM MISC 1 Units by Does not apply route 2 (two) times daily. 08/02/19   Heath Lark D, DO  levothyroxine (SYNTHROID) 25 MCG tablet Take 1 tablet (25 mcg total) by mouth daily before breakfast. 08/12/19   Perlie Mayo, NP  lisinopril (ZESTRIL) 10 MG tablet Take 1 tablet (10 mg total) by mouth daily. 08/12/19   Perlie Mayo, NP  rosuvastatin (CRESTOR) 5 MG tablet Take 1 tablet (5 mg total) by mouth daily at 6 PM. 08/12/19   Perlie Mayo, NP  Vitamin D, Ergocalciferol, (DRISDOL) 1.25 MG (50000 UNIT) CAPS capsule Take 1 capsule (50,000 Units total) by mouth every 7 (seven) days. 08/12/19   Perlie Mayo, NP    Allergies    Patient has no known allergies.  Review of Systems   Review of Systems  Constitutional: Negative for chills, diaphoresis and fever.  Respiratory: Positive for shortness of breath. Negative for cough.     Cardiovascular: Positive for leg swelling. Negative for chest pain.  Gastrointestinal: Negative for abdominal pain, nausea and vomiting.  Genitourinary: Negative for dysuria.  Neurological: Negative for syncope, weakness and numbness.  All other systems reviewed and are negative.   Physical Exam Updated Vital Signs BP (!) 132/97   Pulse (!) 101   Temp 98.4 F (36.9 C) (Oral)   Resp (!) 22   SpO2 94%   Physical Exam Vitals and nursing note reviewed.  Constitutional:      General: He is not in acute distress.    Appearance: He is well-developed. He is not toxic-appearing.  HENT:     Head: Normocephalic and atraumatic.  Eyes:     General:        Right eye: No discharge.        Left eye: No discharge.     Conjunctiva/sclera: Conjunctivae normal.  Cardiovascular:     Rate and Rhythm: Normal rate and regular rhythm.  Pulmonary:     Effort: Pulmonary effort is normal. No respiratory distress.     Breath sounds: Normal breath sounds. No wheezing, rhonchi or rales.  Abdominal:     General: There is no distension.     Palpations: Abdomen is soft.     Tenderness: There is no abdominal tenderness.  Musculoskeletal:     Cervical back: Neck supple.     Comments: 2-3+ symmetric pitting edema to the bilateral lower legs trace extends to the upper portion of the legs & to the sacrum.  No calf tenderness.   Skin:    General: Skin is warm and dry.     Findings: No rash.  Neurological:     Mental Status: He is alert.     Comments: Clear speech.   Psychiatric:        Behavior: Behavior normal.     ED Results / Procedures / Treatments   Labs (all labs ordered are listed, but only abnormal results are displayed) Labs Reviewed  COMPREHENSIVE METABOLIC PANEL - Abnormal; Notable for the following components:      Result Value   Sodium 131 (*)    Glucose, Bld 415 (*)    Calcium 8.7 (*)    Total Protein 6.1 (*)    Albumin 3.1 (*)    AST 45 (*)    ALT 69 (*)    Alkaline  Phosphatase 422 (*)    Total Bilirubin 1.9 (*)    All other components within normal limits  CBC WITH DIFFERENTIAL/PLATELET - Abnormal; Notable for the following components:   RDW 15.8 (*)    All other components within normal limits  BRAIN NATRIURETIC PEPTIDE - Abnormal; Notable for  the following components:   B Natriuretic Peptide 1,165.0 (*)    All other components within normal limits  D-DIMER, QUANTITATIVE (NOT AT [facility]) - Abnormal; Notable for the following components:   D-Dimer, Quant 1.60 (*)    All other components within normal limits  TROPONIN I (HIGH SENSITIVITY) - Abnormal; Notable for the following components:   Troponin I (High Sensitivity) 23 (*)    All other components within normal limits  RESPIRATORY PANEL BY RT PCR (FLU A&B, COVID)  POC SARS CORONAVIRUS 2 AG -  ED  TROPONIN I (HIGH SENSITIVITY)    EKG EKG Interpretation  Date/Time:  Monday August 15 2019 19:37:58 EST Ventricular Rate:  101 PR Interval:    QRS Duration: 95 QT Interval:  353 QTC Calculation: 458 R Axis:   73 Text Interpretation: Sinus tachycardia Anterior infarct, old Minimal ST depression Baseline wander in lead(s) II III aVR aVF Confirmed by Milton Ferguson [phone]) on 08/15/2019 9:33:50 PM   Radiology CT Angio Chest PE W/Cm &/Or Wo Cm  Result Date: 08/15/2019 CLINICAL DATA:  Short of breath, elevated D dimer EXAM: CT ANGIOGRAPHY CHEST WITH CONTRAST TECHNIQUE: Multidetector CT imaging of the chest was performed using the standard protocol during bolus administration of intravenous contrast. Multiplanar CT image reconstructions and MIPs were obtained to evaluate the vascular anatomy. CONTRAST:  149m OMNIPAQUE IOHEXOL 350 MG/ML SOLN COMPARISON:  08/15/2019 FINDINGS: Cardiovascular: This is a technically adequate evaluation of the pulmonary vasculature. There are no filling defects or pulmonary emboli. Heart is enlarged without pericardial effusion. Thoracic aorta is normal in caliber.  Mediastinum/Nodes: Multiple subcentimeter lymph nodes within the mediastinum and hilar nonspecific. No pathologic adenopathy. Lungs/Pleura: There are bilateral pleural effusions volume estimated less than 1 L each. Significant compressive atelectasis within the right lower lobe. Central airways are patent. No pneumothorax. Upper Abdomen: No acute abnormality. Musculoskeletal: No acute or destructive bony lesions. Reconstructed images demonstrate no additional findings. Review of the MIP images confirms the above findings. IMPRESSION: 1. No evidence of pulmonary embolus. 2. Bilateral pleural effusions with lower lobe compressive atelectasis. 3. Cardiomegaly. Electronically Signed   By: MRanda NgoM.D.   On: 08/15/2019 21:55   DG Chest Portable 1 View  Result Date: 08/15/2019 CLINICAL DATA:  Dyspnea. EXAM: PORTABLE CHEST 1 VIEW COMPARISON:  08/01/2019 FINDINGS: The heart size remains enlarged. There is no pneumothorax. There may be a trace right-sided pleural effusion. No acute osseous abnormality. IMPRESSION: No active disease. Electronically Signed   By: CConstance HolsterM.D.   On: 08/15/2019 20:12    Procedures Procedures (including critical care time)  Medications Ordered in ED Medications  furosemide (LASIX) injection 40 mg (has no administration in time range)  insulin aspart (novoLOG) injection 5 Units (5 Units Intravenous Given 08/15/19 2127)  iohexol (OMNIPAQUE) 350 MG/ML injection 100 mL (100 mLs Intravenous Contrast Given 08/15/19 2141)    ED Course  I have reviewed the triage vital signs and the nursing notes.  Pertinent labs & imaging results that were available during my care of the patient were reviewed by me and considered in my medical decision making (see chart for details).  MATHANASIOS HELDMANwas evaluated in Emergency Department on 08/15/2019 for the symptoms described in the history of present illness. He/she was evaluated in the context of the global COVID-19 pandemic, which  necessitated consideration that the patient might be at risk for infection with the SARS-CoV-2 virus that causes COVID-19. Institutional protocols and algorithms that pertain to the evaluation of patients at  risk for COVID-19 are in a state of rapid change based on information released by regulatory bodies including the CDC and federal and state organizations. These policies and algorithms were followed during the patient's care in the ED.    MDM Rules/Calculators/A&P                      Patient presents to the emergency department with progressively worsening shortness of breath and bilateral lower extremity swelling.  Patient is nontoxic, resting comfortably, vitals without significant abnormality.  Lungs seem relatively clear.  Patient has 2-3+ symmetric pitting edema to the bilateral lower extremities.  Differential diagnosis includes: New onset CHF, pneumonia, PE, atypical ACS, renal failure, liver failure.  Plan for EKG, labs, and chest x-ray.  EKG: Sinus tachycardia Anterior infarct, old Minimal ST depression Baseline wander in lead(s) II III aVR aVF  CXR: Sinus tachycardia Anterior infarct, old Minimal ST depression Baseline wander in lead(s) II III aVR aVF   Rapid covid: negative CBC: No significant anemia or leukocytosis CMP: Hyperglycemia without acidosis or anion gap elevation, does not appear consistent with DKA- 5 units IV insulin ordered.  LFT elevation which on chart review is similar to prior. Mild hypoalbuminemia.  BNP: Notably elevated @ 1165.0 Trop: Mild elevation @ 23 D-dimer: 1.60--> Subsequent CTA ordered  CTA: 1. No evidence of pulmonary embolus. 2. Bilateral pleural effusions with lower lobe compressive atelectasis. 3. Cardiomegaly  Overall presentation seems consistent with new onset CHF, mild troponin elevation likely secondary to demand, given his dyspnea on exertion and degree of edema will give Lasix & discuss w/ hospitalist service for admission. Patient care  signed out to supervising physician Dr. Roderic Palau who has evaluated patient & is in agreement, will discuss w/ hospitalist service.   Final Clinical Impression(s) / ED Diagnoses Final diagnoses:  Acute congestive heart failure, unspecified heart failure type [facility])    Rx / DC Orders ED Discharge Orders    None       Leafy Kindle 08/15/19 2208    Milton Ferguson, MD 08/15/19 2238

## 2019-08-15 NOTE — ED Notes (Signed)
Returned from ct 

## 2019-08-16 ENCOUNTER — Telehealth: Payer: Self-pay

## 2019-08-16 ENCOUNTER — Inpatient Hospital Stay (HOSPITAL_COMMUNITY): Payer: PPO

## 2019-08-16 DIAGNOSIS — I361 Nonrheumatic tricuspid (valve) insufficiency: Secondary | ICD-10-CM

## 2019-08-16 DIAGNOSIS — I5023 Acute on chronic systolic (congestive) heart failure: Secondary | ICD-10-CM | POA: Diagnosis present

## 2019-08-16 DIAGNOSIS — I509 Heart failure, unspecified: Secondary | ICD-10-CM

## 2019-08-16 DIAGNOSIS — R7989 Other specified abnormal findings of blood chemistry: Secondary | ICD-10-CM | POA: Insufficient documentation

## 2019-08-16 DIAGNOSIS — R296 Repeated falls: Secondary | ICD-10-CM | POA: Insufficient documentation

## 2019-08-16 DIAGNOSIS — E1165 Type 2 diabetes mellitus with hyperglycemia: Secondary | ICD-10-CM | POA: Insufficient documentation

## 2019-08-16 DIAGNOSIS — Z1211 Encounter for screening for malignant neoplasm of colon: Secondary | ICD-10-CM | POA: Insufficient documentation

## 2019-08-16 DIAGNOSIS — R5383 Other fatigue: Secondary | ICD-10-CM | POA: Insufficient documentation

## 2019-08-16 DIAGNOSIS — I513 Intracardiac thrombosis, not elsewhere classified: Secondary | ICD-10-CM | POA: Diagnosis present

## 2019-08-16 DIAGNOSIS — R413 Other amnesia: Secondary | ICD-10-CM | POA: Insufficient documentation

## 2019-08-16 DIAGNOSIS — I10 Essential (primary) hypertension: Secondary | ICD-10-CM | POA: Insufficient documentation

## 2019-08-16 DIAGNOSIS — Z1159 Encounter for screening for other viral diseases: Secondary | ICD-10-CM | POA: Insufficient documentation

## 2019-08-16 LAB — CBC WITH DIFFERENTIAL/PLATELET
Abs Immature Granulocytes: 0.08 10*3/uL — ABNORMAL HIGH (ref 0.00–0.07)
Basophils Absolute: 0 10*3/uL (ref 0.0–0.1)
Basophils Relative: 1 %
Eosinophils Absolute: 0 10*3/uL (ref 0.0–0.5)
Eosinophils Relative: 0 %
HCT: 42.9 % (ref 39.0–52.0)
Hemoglobin: 13.7 g/dL (ref 13.0–17.0)
Immature Granulocytes: 1 %
Lymphocytes Relative: 23 %
Lymphs Abs: 2 10*3/uL (ref 0.7–4.0)
MCH: 31.6 pg (ref 26.0–34.0)
MCHC: 31.9 g/dL (ref 30.0–36.0)
MCV: 99.1 fL (ref 80.0–100.0)
Monocytes Absolute: 0.8 10*3/uL (ref 0.1–1.0)
Monocytes Relative: 10 %
Neutro Abs: 5.7 10*3/uL (ref 1.7–7.7)
Neutrophils Relative %: 65 %
Platelets: 129 10*3/uL — ABNORMAL LOW (ref 150–400)
RBC: 4.33 MIL/uL (ref 4.22–5.81)
RDW: 15.9 % — ABNORMAL HIGH (ref 11.5–15.5)
WBC: 8.7 10*3/uL (ref 4.0–10.5)
nRBC: 0 % (ref 0.0–0.2)

## 2019-08-16 LAB — GLUCOSE, CAPILLARY
Glucose-Capillary: 256 mg/dL — ABNORMAL HIGH (ref 70–99)
Glucose-Capillary: 362 mg/dL — ABNORMAL HIGH (ref 70–99)
Glucose-Capillary: 133 mg/dL — ABNORMAL HIGH (ref 70–99)
Glucose-Capillary: 64 mg/dL — ABNORMAL LOW (ref 70–99)

## 2019-08-16 LAB — COMPREHENSIVE METABOLIC PANEL
ALT: 65 U/L — ABNORMAL HIGH (ref 0–44)
AST: 46 U/L — ABNORMAL HIGH (ref 15–41)
Albumin: 3 g/dL — ABNORMAL LOW (ref 3.5–5.0)
Alkaline Phosphatase: 432 U/L — ABNORMAL HIGH (ref 38–126)
Anion gap: 12 (ref 5–15)
BUN: 16 mg/dL (ref 8–23)
CO2: 23 mmol/L (ref 22–32)
Calcium: 8.8 mg/dL — ABNORMAL LOW (ref 8.9–10.3)
Chloride: 101 mmol/L (ref 98–111)
Creatinine, Ser: 0.85 mg/dL (ref 0.61–1.24)
GFR calc Af Amer: >60 mL/min (ref >60)
GFR calc non Af Amer: >60 mL/min (ref >60)
Glucose, Bld: 238 mg/dL — ABNORMAL HIGH (ref 70–99)
Potassium: 3.6 mmol/L (ref 3.5–5.1)
Sodium: 136 mmol/L (ref 135–145)
Total Bilirubin: 1.6 mg/dL — ABNORMAL HIGH (ref 0.3–1.2)
Total Protein: 5.8 g/dL — ABNORMAL LOW (ref 6.5–8.1)

## 2019-08-16 LAB — TSH: TSH: 15.913 u[IU]/mL — ABNORMAL HIGH (ref 0.350–4.500)

## 2019-08-16 LAB — ECHOCARDIOGRAM COMPLETE: Weight: 3354.52 oz

## 2019-08-16 LAB — MAGNESIUM: Magnesium: 1.7 mg/dL (ref 1.7–2.4)

## 2019-08-16 MED ORDER — INSULIN STARTER KIT- PEN NEEDLES (ENGLISH)
1.0000 | Freq: Once | Status: AC
  Administered 2019-08-16: 1
  Filled 2019-08-16: qty 1

## 2019-08-16 MED ORDER — ASPIRIN EC 81 MG PO TBEC
81.0000 mg | DELAYED_RELEASE_TABLET | Freq: Every day | ORAL | Status: DC
  Administered 2019-08-16 – 2019-08-18 (×3): 81 mg via ORAL
  Filled 2019-08-16 (×3): qty 1

## 2019-08-16 MED ORDER — SODIUM CHLORIDE 0.9 % IV SOLN
250.0000 mL | INTRAVENOUS | Status: DC | PRN
  Administered 2019-08-19: 07:00:00 250 mL via INTRAVENOUS

## 2019-08-16 MED ORDER — MAGNESIUM SULFATE 2 GM/50ML IV SOLN
2.0000 g | Freq: Once | INTRAVENOUS | Status: AC
  Administered 2019-08-16: 10:00:00 2 g via INTRAVENOUS
  Filled 2019-08-16: qty 50

## 2019-08-16 MED ORDER — ROSUVASTATIN CALCIUM 10 MG PO TABS
5.0000 mg | ORAL_TABLET | Freq: Every day | ORAL | Status: DC
  Administered 2019-08-16 – 2019-08-23 (×8): 5 mg via ORAL
  Filled 2019-08-16 (×8): qty 1

## 2019-08-16 MED ORDER — SODIUM CHLORIDE 0.9% FLUSH: 3.0000 mL | INTRAVENOUS | Status: DC | PRN

## 2019-08-16 MED ORDER — FUROSEMIDE 10 MG/ML IJ SOLN
40.0000 mg | Freq: Two times a day (BID) | INTRAMUSCULAR | Status: DC
  Administered 2019-08-16 – 2019-08-17 (×4): 40 mg via INTRAVENOUS
  Filled 2019-08-16 (×3): qty 4

## 2019-08-16 MED ORDER — INSULIN DETEMIR 100 UNIT/ML ~~LOC~~ SOLN
5.0000 [IU] | Freq: Every day | SUBCUTANEOUS | Status: DC
  Administered 2019-08-17 – 2019-08-22 (×6): 5 [IU] via SUBCUTANEOUS
  Filled 2019-08-16 (×8): qty 0.05

## 2019-08-16 MED ORDER — INSULIN ASPART 100 UNIT/ML ~~LOC~~ SOLN
0.0000 [IU] | Freq: Three times a day (TID) | SUBCUTANEOUS | Status: DC
  Administered 2019-08-16: 09:00:00 11 [IU] via SUBCUTANEOUS
  Administered 2019-08-16: 20 [IU] via SUBCUTANEOUS
  Administered 2019-08-16: 3 [IU] via SUBCUTANEOUS

## 2019-08-16 MED ORDER — ACETAMINOPHEN 325 MG PO TABS: 650.0000 mg | ORAL_TABLET | ORAL | Status: DC | PRN

## 2019-08-16 MED ORDER — SODIUM CHLORIDE 0.9% FLUSH
3.0000 mL | Freq: Two times a day (BID) | INTRAVENOUS | Status: DC
  Administered 2019-08-16 – 2019-08-23 (×15): 3 mL via INTRAVENOUS

## 2019-08-16 MED ORDER — FUROSEMIDE 10 MG/ML IJ SOLN
40.0000 mg | Freq: Every day | INTRAMUSCULAR | Status: DC
  Administered 2019-08-16: 40 mg via INTRAVENOUS
  Filled 2019-08-16 (×2): qty 4

## 2019-08-16 MED ORDER — ENOXAPARIN SODIUM 40 MG/0.4ML ~~LOC~~ SOLN
40.0000 mg | SUBCUTANEOUS | Status: DC
  Administered 2019-08-16: 06:00:00 40 mg via SUBCUTANEOUS
  Filled 2019-08-16: qty 0.4

## 2019-08-16 MED ORDER — GABAPENTIN 100 MG PO CAPS
100.0000 mg | ORAL_CAPSULE | Freq: Three times a day (TID) | ORAL | Status: DC
  Administered 2019-08-16 – 2019-08-23 (×23): 100 mg via ORAL
  Filled 2019-08-16 (×22): qty 1

## 2019-08-16 MED ORDER — APIXABAN 5 MG PO TABS: 5.0000 mg | ORAL_TABLET | Freq: Two times a day (BID) | ORAL | Status: DC

## 2019-08-16 MED ORDER — LEVOTHYROXINE SODIUM 25 MCG PO TABS
25.0000 ug | ORAL_TABLET | Freq: Every day | ORAL | Status: DC
  Administered 2019-08-16: 25 ug via ORAL
  Filled 2019-08-16: qty 1

## 2019-08-16 MED ORDER — CARVEDILOL 3.125 MG PO TABS
3.1250 mg | ORAL_TABLET | Freq: Two times a day (BID) | ORAL | Status: DC
  Administered 2019-08-16 – 2019-08-17 (×4): 3.125 mg via ORAL
  Filled 2019-08-16 (×4): qty 1

## 2019-08-16 MED ORDER — LISINOPRIL 10 MG PO TABS
10.0000 mg | ORAL_TABLET | Freq: Every day | ORAL | Status: DC
  Administered 2019-08-16 – 2019-08-17 (×2): 10 mg via ORAL
  Filled 2019-08-16 (×2): qty 1

## 2019-08-16 MED ORDER — APIXABAN 5 MG PO TABS
10.0000 mg | ORAL_TABLET | Freq: Two times a day (BID) | ORAL | Status: AC
  Administered 2019-08-16 – 2019-08-23 (×14): 10 mg via ORAL
  Filled 2019-08-16 (×14): qty 2

## 2019-08-16 MED ORDER — ONDANSETRON HCL 4 MG/2ML IJ SOLN: 4.0000 mg | Freq: Four times a day (QID) | INTRAMUSCULAR | Status: DC | PRN

## 2019-08-16 MED ORDER — LEVOTHYROXINE SODIUM 75 MCG PO TABS
37.5000 ug | ORAL_TABLET | Freq: Every day | ORAL | Status: DC
  Administered 2019-08-17 – 2019-08-23 (×7): 37.5 ug via ORAL
  Filled 2019-08-16 (×7): qty 1

## 2019-08-16 MED ORDER — IMIPRAMINE HCL 25 MG PO TABS
50.0000 mg | ORAL_TABLET | Freq: Every day | ORAL | Status: DC
  Administered 2019-08-16 – 2019-08-22 (×7): 50 mg via ORAL
  Filled 2019-08-16 (×7): qty 2

## 2019-08-16 NOTE — Progress Notes (Signed)
*  PRELIMINARY RESULTS* Echocardiogram 2D Echocardiogram has been performed.  Jeryl Columbia 08/16/2019, 10:27 AM

## 2019-08-16 NOTE — Assessment & Plan Note (Signed)
Is open to colonoscopy, referral made.

## 2019-08-16 NOTE — Progress Notes (Signed)
Inpatient Diabetes Program Recommendations  AACE/ADA: New Consensus Statement on Inpatient Glycemic Control (2015)  Target Ranges:  Prepandial:   less than 140 mg/dL      Peak postprandial:   less than 180 mg/dL (1-2 hours)      Critically ill patients:  140 - 180 mg/dL   Lab Results  Component Value Date   GLUCAP 362 (H) 08/16/2019   HGBA1C >18.5 (H) 08/01/2019   Inpatient Diabetes Program Recommendations:   Spoke with patient via phone (dm coordinator @ [facility]). Patient shared that he did not pick up his prescription of Novolin 70/30 insulin nor glucose meter kit and doesn't know where his prescriptions are. Explained to patient that the Novolin 70/30 insulin is from [facility] and costs less than other name brand insulins. Patient has an old glucometer but doesn't have strips so needs a new glucometer. Also reviewed hypoglycemia protocol and patient verbalized treatment if he has hypoglycemic symptoms.  On Discharge: Novolin ReliOn Insulin pen (#[id number]) Blood glucose meter kit (#[id number]) Insulin pen needles (#[id number])  Monitor blood sugars 3-4x/day Appt with PCP within 1-2 weeks of discharge  Thank you, Bethena Roys E. Tuyen Uncapher, RN, MSN, CDE  Diabetes Coordinator Inpatient Glycemic Control Team Team Pager [phone] (8am-5pm) 08/16/2019 3:43 PM

## 2019-08-16 NOTE — Assessment & Plan Note (Signed)
US task force recommendation 

## 2019-08-16 NOTE — Telephone Encounter (Signed)
08/15/19(late entry) patient called and stated he was calling about his diabetes because someone had told him to. I asked him how his blood sugars were running and he stated he didn't know because he had not been checking them. I asked him if he had been giving himself his insulin and he said no, he didn't know how. (Patient walked in on Friday 08/12/19 and was taught how to give himself insulin) I again went over each step on how to give the insulin and after each step asked the patient if he understood and he stated yes. He also stated there was someone in the home that knew how to give insulin and could help him. I advised him if he could not give himself the insulin to get that person to help him. I also told him to have that person help him check his blood sugars

## 2019-08-16 NOTE — Assessment & Plan Note (Signed)
Concern for changes in memory-could be recent due to all the changes in health, or related to underlining cause. CT of head was stable. Will follow at future appts. MMSE needed

## 2019-08-16 NOTE — Progress Notes (Signed)
Inpatient Diabetes Program Recommendations  AACE/ADA: New Consensus Statement on Inpatient Glycemic Control  Target Ranges:  Prepandial:   less than 140 mg/dL      Peak postprandial:   less than 180 mg/dL (1-2 hours)      Critically ill patients:  140 - 180 mg/dL   Results for Stanley Levy, Stanley Levy (MRN [id number]) as of 08/16/2019 09:31  Ref. Range 08/16/2019 07:28  Glucose-Capillary Latest Ref Range: 70 - 99 mg/dL 256 (H)  Results for Stanley Levy, Stanley Levy (MRN [id number]) as of 08/16/2019 09:31  Ref. Range 08/11/2019 17:41 08/11/2019 20:38 08/11/2019 21:35 08/11/2019 22:28  Glucose-Capillary Latest Ref Range: 70 - 99 mg/dL 515 (HH) 487 (H) 509 (HH) 392 (H)  Results for Stanley Levy, Stanley Levy (MRN [id number]) as of 08/16/2019 09:31  Ref. Range 08/01/2019 16:37  Hemoglobin A1C Latest Ref Range: 4.8 - 5.6 % >18.5 (H)   Review of Glycemic Control  Diabetes history: DM2 Outpatient Diabetes medications: 70/30 25 units BID Current orders for Inpatient glycemic control: Levemir 5 units QHS, Novolog 0-20 units TID with meals,   Inpatient Diabetes Program Recommendations:    Insulin-Basal:  No Levemir given last night and fastin glucose 256 mg/dl today. Please consider discontinuing Levemir and ordering 70/30 12 units BID (dose will provide a total of 16.8 units for basal and 7.2 units for meal coverage per day).  Insulin-Correction: Please consider ordering Novolog 0-5 units QHS for bedtime correction.  NOTE: In reviewing chart noted patient was inpatient 08/01/19 to 08/02/19 and was discharged on 70/30 12 units BID (insulin pens). Inpatient Diabetes Coordinator spoke with patient over the phone on 08/02/19 regarding A1C >18.5% and discussed insulin. Per note on 08/02/19 by Diabetes Coordinator patient reported he took insulin in the past and he preferred to use insulin pens and he felt confidant in his ability to give himself insulin injections with an insulin pen.  Patient seen new provider Percell Boston, NP) on 08/10/19 and was  called on 08/11/19 and told to go to the Emergency Room on 08/11/19 due to elevated glucose on labs drawn on 08/10/19. Per L. Layden, [place] note on 08/11/19 patient reported he had not been taking any insulin since being discharged on 08/02/19 because he did not have a glucometer. Patient seen Percell Boston, NP on 08/12/19 to get help with learning how to give insulin injections and extensive education was provided on insulin administration to him and his wife at that time. Per note by S. Petrucelli, [place] on 08/15/19 patient reported he had not taken any meds for 2 days due to losing power. Question if patient has taken any insulin at all since being discharged on insulin on 08/02/19. Ordered insulin pen starter kit and patient education by bedside RNs. Sent secure chat to Lowry Ram, RN to ask that patient be educated on insulin administration and allowed to self administer insulin while inpatient. Also asked RN to provide education on insulin pen as well.  Thanks, Barnie Alderman, RN, MSN, CDE Diabetes Coordinator Inpatient Diabetes Program [phone] (Team Pager from 8am to 5pm)

## 2019-08-16 NOTE — Progress Notes (Signed)
  Patient seen and evaluated, chart reviewed, please see EMR for updated orders. Please see full H&P dictated by admitting physician Dr. Carren Rang for same date of service.     Echo 08/16/2019 1)Left ventricular ejection fraction, by estimation, is 20%. The left ventricle has severely decreased function. The left ventricle demonstrates global hypokinesis with regional variation.  2. Large LV Mural Thrombus in Distal anterolateral position,  approximately 2.0 x 2.5 cm (trabecular muscle obscures extent to some degree).   Brief Summary:-  64 y.o. male, w/hx of thyroid disease, HLD, DMII, and anxiety     A/p 1)HFrEF--- EF 20%--patient with combined systolic and diastolic dysfunction CHF -Continue IV Lasix 40 mg twice daily, Coreg 3.125 mg twice daily, -Continue lisinopril 10 mg daily  2)Intracardiac Clot/LV thrombus--- Eliquis per pharmacy  3)H/o Prior CVA----  MRI Brain from 08/02/19--- with Chronic Lt Frontal Lobe encephalomalacia (remote infarct)--- aspirin, Crestor and Eliquis as ordered   4)DM2- A1C is 18.5, Lantus insulin as ordered, Use Novolog/Humalog Sliding scale insulin with Accu-Cheks/Fingersticks as ordered    5) hypothyroidism--- TSH is 15.9, increase levothyroxine to 37.5 mcg from 25   6) social/ethics--- plan of care discussed with patient and wife, patient is a full code, 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 Please see full H&P dictated by admitting physician Dr. Carren Rang for same date of service.

## 2019-08-16 NOTE — Assessment & Plan Note (Signed)
08/09/2019: Established Care Visit: Not controlled, long discussion on DM- blood sugar checks, insulin prep, him and wife reported understanding. He reports drinking a lot of sugary drinks and not eating the best diet. Open to referral with Dr Fransico Him. Open to statin medication as well.  Labs ordered and will address them as results come.

## 2019-08-16 NOTE — Assessment & Plan Note (Signed)
Most likely having fatigue related to poorly controlled DM and TSH being elevated. He is on board for treatments, referrals and close follow up

## 2019-08-16 NOTE — Assessment & Plan Note (Signed)
HIGH FALL RISK Uses cane, shoes today are TOO BIG, I have educated on the need for shoes that fit better so he does not trip. He and his wife report understanding. I have encouraged walking with cane always, and if more falls occur we will look at PT. I worry that sensations/neuropathy might be the cause. Also vitamin B 12?

## 2019-08-16 NOTE — H&P (Signed)
TRH H&P    Patient Demographics:    Stanley Levy, is a 64 y.o. male  MRN: [id number]  DOB - 30-Oct-1955  Admit Date - 08/15/2019  Referring MD/NP/PA: Dr. Roderic Palau  Outpatient Primary MD for the patient is Perlie Mayo, NP  Patient coming from: Home  Chief complaint- Leg swelling   HPI:    Stanley Levy  is a 64 y.o. male, w/hx of thyroid disease, HLD, DMII, and anxiety presents with chief complaint of peripheral edema. Patient is a poor historian, and seems to have little insight into his medical conditions and medications at home. Patient reports that the onset of his symptoms was 24 hours ago. Patient reports that his legs were so swollen that he couldn't walk. He has associated dyspnea. He reports that standing up and exerting himself make both symptoms worse. He denies orthopnea. He denies chest pain, palpitations, and wheezing. Patient reports that he has not had anything like this before.   In ED T 98.4, P 101, RR22, BP 132/97  Na 125 BUN/CR 24/1.09 Troponin 23, 21 D-Dimer 1.6 CTA with pleural effusions - no PE Covid negative BNP  >1600     Review of systems:    In addition to the HPI above,  No Fever-chills, No Headache, No changes with Vision or hearing, No problems swallowing food or Liquids, No Chest pain, Cough or Shortness of Breath, No Abdominal pain, No Nausea or Vomiting, bowel movements are regular, No Blood in stool or Urine, No dysuria, No new skin rashes or bruises, No new joints pains-aches,  No new weakness, tingling, numbness in any extremity, No recent weight gain or loss, No polyuria, polydypsia or polyphagia, No significant Mental Stressors.  All other systems reviewed and are negative.    Past History of the following :    Past Medical History:  Diagnosis Date  . Anxiety   . Diabetes mellitus, type II ([place])   . Hyperlipidemia   . Thyroid disease        Past Surgical History:  Procedure Laterality Date  . FOOT SURGERY        Social History:      Social History   Tobacco Use  . Smoking status: Never Smoker  . Smokeless tobacco: Never Used  Substance Use Topics  . Alcohol use: No       Family History :     Family History  Problem Relation Age of Onset  . Anxiety disorder Neg Hx   . Dementia Neg Hx   . Alcohol abuse Neg Hx   . Drug abuse Neg Hx   . ADD / ADHD Neg Hx   . Bipolar disorder Neg Hx   . Depression Neg Hx   . OCD Neg Hx   . Paranoid behavior Neg Hx   . Physical abuse Neg Hx   . Schizophrenia Neg Hx   . Seizures Neg Hx   . Sexual abuse Neg Hx       Home Medications:   Prior to Admission medications   Medication Sig Start Date  End Date Taking? Authorizing Provider  gabapentin (NEURONTIN) 100 MG capsule Take 1 capsule (100 mg total) by mouth 3 (three) times daily. 08/02/19 08/01/20 Yes Shah, Pratik D, DO  imipramine (TOFRANIL) 50 MG tablet Take 1 tablet (50 mg total) by mouth at bedtime. 12/01/18  Yes Cloria Spring, MD  insulin aspart protamine - aspart (NOVOLOG MIX 70/30 FLEXPEN) (70-30) 100 UNIT/ML FlexPen Inject 0.25 mLs (25 Units total) into the skin 2 (two) times daily with a meal. 08/12/19  Yes Perlie Mayo, NP  levothyroxine (SYNTHROID) 25 MCG tablet Take 1 tablet (25 mcg total) by mouth daily before breakfast. 08/12/19  Yes Perlie Mayo, NP  lisinopril (ZESTRIL) 10 MG tablet Take 1 tablet (10 mg total) by mouth daily. 08/12/19  Yes Perlie Mayo, NP  rosuvastatin (CRESTOR) 5 MG tablet Take 1 tablet (5 mg total) by mouth daily at 6 PM. 08/12/19  Yes Perlie Mayo, NP  Vitamin D, Ergocalciferol, (DRISDOL) 1.25 MG (50000 UNIT) CAPS capsule Take 1 capsule (50,000 Units total) by mouth every 7 (seven) days. 08/12/19  Yes Perlie Mayo, NP  blood glucose meter kit and supplies KIT Dispense based on patient and insurance preference. Use up to four times daily as directed. (FOR ICD-9 250.00,  250.01). Patient not taking: Reported on 08/15/2019 08/11/19   Providence Lanius A, PA-C  blood glucose meter kit and supplies Dispense based on patient and insurance preference. Use up to four times daily as directed. (FOR ICD-10 E10.9, E11.9). Patient not taking: Reported on 08/15/2019 08/02/19   Heath Lark D, DO  glucose blood (GLUCOSE METER TEST) test strip Use as instructed Patient not taking: Reported on 08/15/2019 08/12/19   Perlie Mayo, NP  Insulin Pen Needle 31G X 5 MM MISC 1 Units by Does not apply route 2 (two) times daily. Patient not taking: Reported on 08/15/2019 08/02/19   Heath Lark D, DO     Allergies:    No Known Allergies   Physical Exam:   Vitals  Blood pressure (!) 132/97, pulse (!) 101, temperature 98.4 F (36.9 C), temperature source Oral, resp. rate (!) 25, SpO2 94 %.  1.  General: Laying right lateral decubitus in bed in no acute distress  2. Psychiatric: Possible cognitive delay Mood appropriate for situation  3. Neurologic: CN II-XII grossly intact  No asymmetric weakness  4. HEENMT:  Head a traumatic, normocephalic  Pupils reactive  EOMI Neck supple, trachea midline  5. Respiratory : No crackles or wheezing  6. Cardiovascular : HRRR  7. Gastrointestinal:  Abdomen soft, nondistended and non tender to palpation  8. Skin:  No acute lesions on limited skin exam  9.Musculoskeletal:  2+ peripheral edema to mid thigh    Data Review:    CBC Recent Labs  Lab 08/11/19 0859 08/11/19 1747 08/15/19 1955  WBC 7.4 7.9 9.4  HGB 14.8 14.8 13.8  HCT 45.2 45.1 42.7  PLT 163 153 157  MCV 96.8 97.2 99.3  MCH 31.7 31.9 32.1  MCHC 32.7 32.8 32.3  RDW 13.3 14.9 15.8*  LYMPHSABS  --   --  1.5  MONOABS  --   --  0.8  EOSABS  --   --  0.0  BASOSABS  --   --  0.0   ------------------------------------------------------------------------------------------------------------------  Results for orders placed or performed during the hospital  encounter of 08/15/19 (from the past 48 hour(s))  Comprehensive metabolic panel     Status: Abnormal   Collection Time: 08/15/19  7:55  PM  Result Value Ref Range   Sodium 131 (L) 135 - 145 mmol/L   Potassium 5.0 3.5 - 5.1 mmol/L   Chloride 100 98 - 111 mmol/L   CO2 22 22 - 32 mmol/L   Glucose, Bld 415 (H) 70 - 99 mg/dL   BUN 18 8 - 23 mg/dL   Creatinine, Ser 1.11 0.61 - 1.24 mg/dL   Calcium 8.7 (L) 8.9 - 10.3 mg/dL   Total Protein 6.1 (L) 6.5 - 8.1 g/dL   Albumin 3.1 (L) 3.5 - 5.0 g/dL   AST 45 (H) 15 - 41 U/L   ALT 69 (H) 0 - 44 U/L   Alkaline Phosphatase 422 (H) 38 - 126 U/L   Total Bilirubin 1.9 (H) 0.3 - 1.2 mg/dL   GFR calc non Af Amer >60 >60 mL/min   GFR calc Af Amer >60 >60 mL/min   Anion gap 9 5 - 15    Comment: Performed at [facility], [address], [place], [place] [address]  CBC with Differential     Status: Abnormal   Collection Time: 08/15/19  7:55 PM  Result Value Ref Range   WBC 9.4 4.0 - 10.5 K/uL   RBC 4.30 4.22 - 5.81 MIL/uL   Hemoglobin 13.8 13.0 - 17.0 g/dL   HCT 42.7 39.0 - 52.0 %   MCV 99.3 80.0 - 100.0 fL   MCH 32.1 26.0 - 34.0 pg   MCHC 32.3 30.0 - 36.0 g/dL   RDW 15.8 (H) 11.5 - 15.5 %   Platelets 157 150 - 400 K/uL   nRBC 0.0 0.0 - 0.2 %   Neutrophils Relative % 75 %   Neutro Abs 7.1 1.7 - 7.7 K/uL   Lymphocytes Relative 16 %   Lymphs Abs 1.5 0.7 - 4.0 K/uL   Monocytes Relative 8 %   Monocytes Absolute 0.8 0.1 - 1.0 K/uL   Eosinophils Relative 0 %   Eosinophils Absolute 0.0 0.0 - 0.5 K/uL   Basophils Relative 0 %   Basophils Absolute 0.0 0.0 - 0.1 K/uL   Immature Granulocytes 1 %   Abs Immature Granulocytes 0.06 0.00 - 0.07 K/uL    Comment: Performed at [facility], [address], [place], [place] [address]  Brain natriuretic peptide     Status: Abnormal   Collection Time: 08/15/19  7:55 PM  Result Value Ref Range   B Natriuretic Peptide 1,165.0 (H) 0.0 - 100.0 pg/mL    Comment: Performed at [facility], [address]  [place],  [place] [address]  Troponin I (High Sensitivity)     Status: Abnormal   Collection Time: 08/15/19  7:55 PM  Result Value Ref Range   Troponin I (High Sensitivity) 23 (H) <18 ng/L    Comment: (NOTE) Elevated high sensitivity troponin I (hsTnI) values and significant  changes across serial measurements may suggest ACS but many other  chronic and acute conditions are known to elevate hsTnI results.  Refer to the "Links" section for chest pain algorithms and additional  guidance. Performed at [facility], [address], [place], [place] [address]   D-dimer, quantitative (not at [facility])     Status: Abnormal   Collection Time: 08/15/19  7:55 PM  Result Value Ref Range   D-Dimer, Quant 1.60 (H) 0.00 - 0.50 ug/mL-FEU    Comment: (NOTE) At the manufacturer cut-off of 0.50 ug/mL FEU, this assay has been documented to exclude PE with a sensitivity and negative predictive value of 97 to 99%.  At  this time, this assay has not been approved by the FDA to exclude DVT/VTE. Results should be correlated with clinical presentation. Performed at [facility], [address] [place], [place] [address]   POC SARS Coronavirus 2 Ag-ED - Nasal Swab (BD Veritor Kit)     Status: None   Collection Time: 08/15/19  8:05 PM  Result Value Ref Range   SARS Coronavirus 2 Ag NEGATIVE NEGATIVE    Comment: (NOTE) SARS-CoV-2 antigen NOT DETECTED.  Negative results are presumptive.  Negative results do not preclude SARS-CoV-2 infection and should not be used as the sole basis for treatment or other patient management decisions, including infection  control decisions, particularly in the presence of clinical signs and  symptoms consistent with COVID-19, or in those who have been in contact with the virus.  Negative results must be combined with clinical observations, patient history, and epidemiological information. The expected result is Negative. Fact Sheet for Patients:  PodPark.tn Fact Sheet for Healthcare Providers: GiftContent.is This test is not yet approved or cleared by the [place] FDA and  has been authorized for detection and/or diagnosis of SARS-CoV-2 by FDA under an Emergency Use Authorization (EUA).  This EUA will remain in effect (meaning this test can be used) for the duration of  the COVID-19 de claration under Section 564(b)(1) of the Act, 21 U.S.C. section 360bbb-3(b)(1), unless the authorization is terminated or revoked sooner.   Respiratory Panel by RT PCR (Flu A&B, Covid) - Nasopharyngeal Swab     Status: None   Collection Time: 08/15/19  9:52 PM   Specimen: Nasopharyngeal Swab  Result Value Ref Range   SARS Coronavirus 2 by RT PCR NEGATIVE NEGATIVE    Comment: (NOTE) SARS-CoV-2 target nucleic acids are NOT DETECTED. The SARS-CoV-2 RNA is generally detectable in upper respiratoy specimens during the acute phase of infection. The lowest concentration of SARS-CoV-2 viral copies this assay can detect is 131 copies/mL. A negative result does not preclude SARS-Cov-2 infection and should not be used as the sole basis for treatment or other patient management decisions. A negative result may occur with  improper specimen collection/handling, submission of specimen other than nasopharyngeal swab, presence of viral mutation(s) within the areas targeted by this assay, and inadequate number of viral copies (<131 copies/mL). A negative result must be combined with clinical observations, patient history, and epidemiological information. The expected result is Negative. Fact Sheet for Patients:  PinkCheek.be Fact Sheet for Healthcare Providers:  GravelBags.it This test is not yet ap proved or cleared by the [place] FDA and  has been authorized for detection and/or diagnosis of SARS-CoV-2 by FDA under an Emergency Use  Authorization (EUA). This EUA will remain  in effect (meaning this test can be used) for the duration of the COVID-19 declaration under Section 564(b)(1) of the Act, 21 U.S.C. section 360bbb-3(b)(1), unless the authorization is terminated or revoked sooner.    Influenza A by PCR NEGATIVE NEGATIVE   Influenza B by PCR NEGATIVE NEGATIVE    Comment: (NOTE) The Xpert Xpress SARS-CoV-2/FLU/RSV assay is intended as an aid in  the diagnosis of influenza from Nasopharyngeal swab specimens and  should not be used as a sole basis for treatment. Nasal washings and  aspirates are unacceptable for Xpert Xpress SARS-CoV-2/FLU/RSV  testing. Fact Sheet for Patients: PinkCheek.be Fact Sheet for Healthcare Providers: GravelBags.it This test is not yet approved or cleared by the [place] FDA and  has been authorized for detection and/or diagnosis of SARS-CoV-2 by  FDA  under an Emergency Use Authorization (EUA). This EUA will remain  in effect (meaning this test can be used) for the duration of the  Covid-19 declaration under Section 564(b)(1) of the Act, 21  U.S.C. section 360bbb-3(b)(1), unless the authorization is  terminated or revoked. Performed at [facility], [address], [place], [place] [address]   Troponin I (High Sensitivity)     Status: Abnormal   Collection Time: 08/15/19 10:22 PM  Result Value Ref Range   Troponin I (High Sensitivity) 21 (H) <18 ng/L    Comment: (NOTE) Elevated high sensitivity troponin I (hsTnI) values and significant  changes across serial measurements may suggest ACS but many other  chronic and acute conditions are known to elevate hsTnI results.  Refer to the "Links" section for chest pain algorithms and additional  guidance. Performed at [facility], [address], [place], [place] [address]     Chemistries  Recent Labs  Lab 08/11/19 [phone] 08/11/19 1747 08/15/19 1955  NA 132* 125* 131*  K  5.1 5.0 5.0  CL 96* 91* 100  CO2 _0 GLUCOSE 499* 562* 415*  BUN 16 24* 18  CREATININE 1.01 1.09 1.11  CALCIUM 8.8 8.6* 8.7*  AST 37*  --  45*  ALT 68*  --  69*  ALKPHOS  --   --  422*  BILITOT 1.1  --  1.9*   ------------------------------------------------------------------------------------------------------------------  ------------------------------------------------------------------------------------------------------------------ GFR: Estimated Creatinine Clearance: 74.7 mL/min (by C-G formula based on SCr of 1.11 mg/dL). Liver Function Tests: Recent Labs  Lab 08/11/19 0859 08/15/19 1955  AST 37* 45*  ALT 68* 69*  ALKPHOS  --  422*  BILITOT 1.1 1.9*  PROT 5.9* 6.1*  ALBUMIN  --  3.1*   No results for input(s): LIPASE, AMYLASE in the last 168 hours. No results for input(s): AMMONIA in the last 168 hours. Coagulation Profile: No results for input(s): INR, PROTIME in the last 168 hours. Cardiac Enzymes: No results for input(s): CKTOTAL, CKMB, CKMBINDEX, TROPONINI in the last 168 hours. BNP (last 3 results) No results for input(s): PROBNP in the last 8760 hours. HbA1C: No results for input(s): HGBA1C in the last 72 hours. CBG: Recent Labs  Lab 08/11/19 1741 08/11/19 2038 08/11/19 2135 08/11/19 2228  GLUCAP 515* 487* 509* 392*   Lipid Profile: No results for input(s): CHOL, HDL, LDLCALC, TRIG, CHOLHDL, LDLDIRECT in the last 72 hours. Thyroid Function Tests: No results for input(s): TSH, T4TOTAL, FREET4, T3FREE, THYROIDAB in the last 72 hours. Anemia Panel: No results for input(s): VITAMINB12, FOLATE, FERRITIN, TIBC, IRON, RETICCTPCT in the last 72 hours.  --------------------------------------------------------------------------------------------------------------- Urine analysis:    Component Value Date/Time   COLORURINE YELLOW 08/11/2019 [place] 08/11/2019 1840   LABSPEC 1.024 08/11/2019 1840   PHURINE 5.0 08/11/2019 1840    GLUCOSEU >=500 (A) 08/11/2019 1840   HGBUR NEGATIVE 08/11/2019 1840   BILIRUBINUR NEGATIVE 08/11/2019 1840   KETONESUR NEGATIVE 08/11/2019 1840   PROTEINUR NEGATIVE 08/11/2019 1840   NITRITE NEGATIVE 08/11/2019 1840   LEUKOCYTESUR NEGATIVE 08/11/2019 1840      Imaging Results:    CT Angio Chest PE W/Cm &/Or Wo Cm  Result Date: 08/15/2019 CLINICAL DATA:  Short of breath, elevated D dimer EXAM: CT ANGIOGRAPHY CHEST WITH CONTRAST TECHNIQUE: Multidetector CT imaging of the chest was performed using the standard protocol during bolus administration of intravenous contrast. Multiplanar CT image reconstructions and MIPs were obtained to evaluate the vascular anatomy. CONTRAST:  150m OMNIPAQUE IOHEXOL 350 MG/ML  SOLN COMPARISON:  08/15/2019 FINDINGS: Cardiovascular: This is a technically adequate evaluation of the pulmonary vasculature. There are no filling defects or pulmonary emboli. Heart is enlarged without pericardial effusion. Thoracic aorta is normal in caliber. Mediastinum/Nodes: Multiple subcentimeter lymph nodes within the mediastinum and hilar nonspecific. No pathologic adenopathy. Lungs/Pleura: There are bilateral pleural effusions volume estimated less than 1 L each. Significant compressive atelectasis within the right lower lobe. Central airways are patent. No pneumothorax. Upper Abdomen: No acute abnormality. Musculoskeletal: No acute or destructive bony lesions. Reconstructed images demonstrate no additional findings. Review of the MIP images confirms the above findings. IMPRESSION: 1. No evidence of pulmonary embolus. 2. Bilateral pleural effusions with lower lobe compressive atelectasis. 3. Cardiomegaly. Electronically Signed   By: Randa Ngo M.D.   On: 08/15/2019 21:55   DG Chest Portable 1 View  Result Date: 08/15/2019 CLINICAL DATA:  Dyspnea. EXAM: PORTABLE CHEST 1 VIEW COMPARISON:  08/01/2019 FINDINGS: The heart size remains enlarged. There is no pneumothorax. There may be a trace  right-sided pleural effusion. No acute osseous abnormality. IMPRESSION: No active disease. Electronically Signed   By: Constance Holster M.D.   On: 08/15/2019 20:12      Assessment & Plan:    Active Problems:   CHF (congestive heart failure) ([place])   1. New Onset CHF 1. Peripheral edema and dyspnea 2. CXR shows cardiomegaly 3. CTA shows BL pleural effusions w/lower lobe compressive atelectasis 4. BNP 1165 5. Troponin stable 23, 21 6. EKG - sinus tach 101, old anterior infarct, Baseline wander 7. Lasix 78m daily 8. Monitor Is and Os 9. Echo pending 2. Thyroid disease 1. Continue synthroid 2. Check TSH 3. HLD 1. Continue statin medication 4. DMII 1. Last HgbA1C 2 weeks ago >18.5 2. Basal insulin, sliding scale coverage, carb modified diet 5. Pseudohyponatremia 1. Corrected for glucose = 132    DVT Prophylaxis-   Lovenox - SCDs   AM Labs Ordered, also please review Full Orders  Family Communication: No Family at bedside Code Status:  FULL  Admission status: Inpatient: Based on patients clinical presentation and evaluation of above clinical data, I have made determination that patient meets Inpatient criteria at this time.  Time spent in minutes : 6[place]

## 2019-08-16 NOTE — Assessment & Plan Note (Signed)
Overall demonstrated control, but might benefit from low dose ACE for kidney protection. Encouraged DASH diet as well.

## 2019-08-16 NOTE — Progress Notes (Signed)
ANTICOAGULATION CONSULT NOTE - Initial Consult  Pharmacy Consult for Apixaban Indication: Intracardiac thrombus  No Known Allergies  Patient Measurements: Weight: 209 lb 10.5 oz (95.1 kg)  Vital Signs: Temp: 98.3 F (36.8 C) (02/16 1337) Temp Source: Oral (02/16 0900) BP: 103/68 (02/16 1337) Pulse Rate: 87 (02/16 1337)  Labs: Recent Labs    08/15/19 1955 08/15/19 2222 08/16/19 0557  HGB 13.8  --  13.7  HCT 42.7  --  42.9  PLT 157  --  129*  CREATININE 1.11  --  0.85  TROPONINIHS 23* 21*  --     Estimated Creatinine Clearance: 98.7 mL/min (by C-G formula based on SCr of 0.85 mg/dL).   Medical History: Past Medical History:  Diagnosis Date  . Anxiety   . Diabetes mellitus, type II ([facility])   . Hyperlipidemia   . Thyroid disease     Medications:  No oral anticoagulation PTA During hospitalization, patient on Lovenox 40mg  sq q24h (last dose 2/16 @ 05:42)  Assessment:  65yr male admitted on 2/16 for new onset CHF.  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

## 2019-08-16 NOTE — Assessment & Plan Note (Signed)
Checked panel, remained elevated, low dose synthroid started, is his TSH higher due to DM? Unsure, referral to Dr Fransico Him in place.

## 2019-08-17 ENCOUNTER — Ambulatory Visit: Payer: PPO | Admitting: Family Medicine

## 2019-08-17 LAB — GLUCOSE, CAPILLARY
Glucose-Capillary: 63 mg/dL — ABNORMAL LOW (ref 70–99)
Glucose-Capillary: 79 mg/dL (ref 70–99)
Glucose-Capillary: 256 mg/dL — ABNORMAL HIGH (ref 70–99)
Glucose-Capillary: 226 mg/dL — ABNORMAL HIGH (ref 70–99)
Glucose-Capillary: 182 mg/dL — ABNORMAL HIGH (ref 70–99)

## 2019-08-17 LAB — BASIC METABOLIC PANEL
Anion gap: 10 (ref 5–15)
BUN: 21 mg/dL (ref 8–23)
CO2: 27 mmol/L (ref 22–32)
Calcium: 8.5 mg/dL — ABNORMAL LOW (ref 8.9–10.3)
Chloride: 101 mmol/L (ref 98–111)
Creatinine, Ser: 0.94 mg/dL (ref 0.61–1.24)
GFR calc Af Amer: >60 mL/min (ref >60)
GFR calc non Af Amer: >60 mL/min (ref >60)
Glucose, Bld: 55 mg/dL — ABNORMAL LOW (ref 70–99)
Potassium: 4 mmol/L (ref 3.5–5.1)
Sodium: 138 mmol/L (ref 135–145)

## 2019-08-17 MED ORDER — INSULIN ASPART 100 UNIT/ML ~~LOC~~ SOLN
0.0000 [IU] | Freq: Every day | SUBCUTANEOUS | Status: DC
  Administered 2019-08-18: 3 [IU] via SUBCUTANEOUS
  Administered 2019-08-19 – 2019-08-20 (×2): 5 [IU] via SUBCUTANEOUS
  Administered 2019-08-21: 4 [IU] via SUBCUTANEOUS
  Administered 2019-08-22: 5 [IU] via SUBCUTANEOUS

## 2019-08-17 MED ORDER — SODIUM CHLORIDE 0.9 % IV BOLUS
500.0000 mL | Freq: Once | INTRAVENOUS | Status: AC
  Administered 2019-08-17: 23:00:00 500 mL via INTRAVENOUS

## 2019-08-17 MED ORDER — INSULIN ASPART 100 UNIT/ML ~~LOC~~ SOLN
0.0000 [IU] | Freq: Three times a day (TID) | SUBCUTANEOUS | Status: DC
  Administered 2019-08-17: 12:00:00 8 [IU] via SUBCUTANEOUS
  Administered 2019-08-17: 17:00:00 5 [IU] via SUBCUTANEOUS
  Administered 2019-08-18: 18:00:00 11 [IU] via SUBCUTANEOUS
  Administered 2019-08-18: 10:00:00 2 [IU] via SUBCUTANEOUS
  Administered 2019-08-18: 13:00:00 11 [IU] via SUBCUTANEOUS
  Administered 2019-08-19: 4 [IU] via SUBCUTANEOUS
  Administered 2019-08-19: 12:00:00 3 [IU] via SUBCUTANEOUS
  Administered 2019-08-20: 5 [IU] via SUBCUTANEOUS
  Administered 2019-08-20: 8 [IU] via SUBCUTANEOUS
  Administered 2019-08-20: 11 [IU] via SUBCUTANEOUS
  Administered 2019-08-21 (×2): 5 [IU] via SUBCUTANEOUS
  Administered 2019-08-22: 2 [IU] via SUBCUTANEOUS
  Administered 2019-08-22 (×2): 3 [IU] via SUBCUTANEOUS
  Administered 2019-08-23: 5 [IU] via SUBCUTANEOUS
  Administered 2019-08-23: 08:00:00 2 [IU] via SUBCUTANEOUS
  Administered 2019-08-23: 18:00:00 11 [IU] via SUBCUTANEOUS

## 2019-08-17 NOTE — Clinical Social Work Note (Addendum)
PT recommended HHPT. Patient does not feel he needs HHPT but deferred to spouse. Mrs. Verde is agreeable to HHPT for patient.  Referral sent to multiple agencies in an attempt to get patient HHPT. Patient referred to [facility], declined; [facility], declined, [facility], declined; [facility], has $50 per visit copay, [facility] declined.   Curtez Brallier, Juleen China, LCSW

## 2019-08-17 NOTE — Progress Notes (Signed)
MD made aware of bp 85/65.

## 2019-08-17 NOTE — Progress Notes (Signed)
pts legs are very weepy saturating sheets and blankets.  Pt is constantly cleaned and bed changed.  Unable to apply TED hose nor can pt tolerate SCDs. Pt encouraged to move his feet and legs frequently

## 2019-08-17 NOTE — Care Management Important Message (Signed)
Important Message  Patient Details  Name: Stanley Levy MRN: [id number] Date of Birth: 04/16/1956   Medicare Important Message Given:  Yes     Corey Harold 08/17/2019, 2:22 PM

## 2019-08-17 NOTE — Progress Notes (Signed)
Patient Demographics:    Stanley Levy, is a 64 y.o. male, DOB - Apr 30, 1956, SEL:[id number]  Admit date - 08/15/2019   Admitting Physician Lilyan Gilford, DO  Outpatient Primary MD for the patient is Freddy Finner, NP  LOS - 2   Chief Complaint  Patient presents with  . Shortness of Breath        Subjective:    Stanley Levy today has no fevers, no emesis,  No chest pain,   -Voiding okay, -Legs are weepy and edematous, shortness of breath is not worse  Assessment  & Plan :    Principal Problem:   Intracardiac thrombosis Active Problems:   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 %   Falls frequently   Essential hypertension   Echo 08/16/2019 1)Left ventricular ejection fraction, by estimation, is 20%. The left ventricle has severely decreased function. The left ventricle demonstrates global hypokinesis with regional variation.  2. Large LV Mural Thrombus in Distal anterolateral position,  approximately 2.0 x 2.5 cm (trabecular muscle obscures extent to some degree).   Brief Summary:- 64 y.o.male,w/hx of thyroid disease, HLD, DMII, and anxiety  Admitted on 08/15/2017 with dyspnea and found to have CHF with EF of 20%  A/p 1)HFrEF--- EF 20%--patient with combined systolic and diastolic dysfunction CHF -Continue IV Lasix 40 mg twice daily, Coreg 3.125 mg twice daily, -Continue lisinopril 10 mg daily -Patient remains volume overloaded  2)Intracardiac Clot/LV thrombus--- Eliquis per pharmacy  3)H/o Prior CVA----  MRI Brain from 08/02/19--- with Chronic Lt Frontal Lobe encephalomalacia (remote infarct)--- aspirin, Crestor and Eliquis as ordered   4)DM2- A1C is 18.5, Lantus insulin as ordered, Use Novolog/Humalog Sliding scale insulin with Accu-Cheks/Fingersticks as ordered    5) hypothyroidism--- TSH is  15.9, increased levothyroxine to 37.5 mcg from 25   6) social/ethics--- plan of care discussed with patient and wife, patient is a full code, 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 apixaban  10 mg Oral BID   Followed by  . [START ON 08/23/2019] apixaban  5 mg Oral BID  . aspirin EC  81 mg Oral Daily  . carvedilol  3.125 mg Oral BID WC  . furosemide  40 mg Intravenous BID  . gabapentin  100 mg Oral TID  . imipramine  50 mg Oral QHS  . insulin aspart  0-15 Units Subcutaneous TID WC  . insulin aspart  0-5 Units Subcutaneous QHS  . insulin detemir  5 Units Subcutaneous QHS  . levothyroxine  37.5 mcg Oral QAC breakfast  . lisinopril  10 mg Oral Daily  . rosuvastatin  5 mg  Oral q1800  . sodium chloride flush  3 mL Intravenous Q12H   Continuous Infusions: . sodium chloride     PRN Meds:.sodium chloride, acetaminophen, ondansetron (ZOFRAN) IV, sodium chloride flush    Anti-infectives (From admission, onward)   None        Objective:   Vitals:   08/17/19 0631 08/17/19 0948 08/17/19 0955 08/17/19 1356  BP: (!) 100/58 101/64 101/64 (!) 87/60  Pulse:  78  86  Resp:    19  Temp:    98 F (36.7 C)  TempSrc:      SpO2:    100%  Weight: 98.8 kg       Wt Readings from Last 3 Encounters:  08/17/19 98.8 kg  08/12/19 93 kg  08/11/19 93 kg     Intake/Output Summary (Last 24 hours) at 08/17/2019  1928 Last data filed at 08/17/2019 1900 Gross per 24 hour  Intake 720 ml  Output 700 ml  Net 20 ml     Physical Exam  Gen:- Awake Alert,  In no apparent distress  HEENT:- [place].AT, No sclera icterus Neck-Supple Neck,No JVD,.  Lungs-diminished air movement with bibasilar rales CV- S1, S2 normal, regular , 2/6 SM Abd-  +ve B.Sounds, Abd Soft, No tenderness,    Extremity/Skin:-2-3+ pitting edema, weepy legs, pedal pulses present  Psych-affect is appropriate, oriented x3 Neuro-generalized weakness, no new focal deficits, no tremors   Data Review:   Micro Results Recent Results (from the past 240 hour(s))  Respiratory Panel by RT PCR (Flu A&B, Covid) - Nasopharyngeal Swab     Status: None   Collection Time: 08/15/19  9:52 PM   Specimen: Nasopharyngeal Swab  Result Value Ref Range Status   SARS Coronavirus 2 by RT PCR NEGATIVE NEGATIVE Final    Comment: (NOTE) SARS-CoV-2 target nucleic acids are NOT DETECTED. The SARS-CoV-2 RNA is generally detectable in upper respiratoy specimens during the acute phase of infection. The lowest concentration of SARS-CoV-2 viral copies this assay can detect is 131 copies/mL. A negative result does not preclude SARS-Cov-2 infection and should not be used as the sole basis for treatment or other patient management decisions. A negative result may occur with  improper specimen collection/handling, submission of specimen other than nasopharyngeal swab, presence of viral mutation(s) within the areas targeted by this assay, and inadequate number of viral copies (<131 copies/mL). A negative result must be combined with clinical observations, patient history, and epidemiological information. The expected result is Negative. Fact Sheet for Patients:  https://www.moore.com/ Fact Sheet for Healthcare Providers:  https://www.young.biz/ This test is not yet ap proved or cleared by the [place] FDA and  has been  authorized for detection and/or diagnosis of SARS-CoV-2 by FDA under an Emergency Use Authorization (EUA). This EUA will remain  in effect (meaning this test can be used) for the duration of the COVID-19 declaration under Section 564(b)(1) of the Act, 21 U.S.C. section 360bbb-3(b)(1), unless the authorization is terminated or revoked sooner.    Influenza A by PCR NEGATIVE NEGATIVE Final   Influenza B by PCR NEGATIVE NEGATIVE Final    Comment: (NOTE) The Xpert Xpress SARS-CoV-2/FLU/RSV assay is intended as an aid in  the diagnosis of influenza from Nasopharyngeal swab specimens and  should not be used as a sole basis for treatment. Nasal washings and  aspirates are unacceptable for Xpert Xpress SARS-CoV-2/FLU/RSV  testing. Fact Sheet for Patients: https://www.moore.com/ Fact Sheet for Healthcare Providers: https://www.young.biz/ This test is not yet approved or cleared by the [place]  States FDA and  has been authorized for detection and/or diagnosis of SARS-CoV-2 by  FDA under an Emergency Use Authorization (EUA). This EUA will remain  in effect (meaning this test can be used) for the duration of the  Covid-19 declaration under Section 564(b)(1) of the Act, 21  U.S.C. section 360bbb-3(b)(1), unless the authorization is  terminated or revoked. Performed at [facility], [address], [place], [place] [address]     Radiology Reports CT HEAD WO CONTRAST  Result Date: 08/01/2019 CLINICAL DATA:  Multiple falls. EXAM: CT HEAD WITHOUT CONTRAST TECHNIQUE: Contiguous axial images were obtained from the base of the skull through the vertex without intravenous contrast. COMPARISON:  None. FINDINGS: Brain: There is moderate severity cerebral atrophy with widening of the extra-axial spaces and ventricular dilatation. There are areas of decreased attenuation within the white matter tracts of the supratentorial brain, consistent with microvascular disease  changes. Adjacent 1 mm and 2 mm foci of white matter calcification are seen along the posterior aspect of the centrum semiovale on the left. Vascular: No hyperdense vessel or unexpected calcification. Skull: Normal. Negative for fracture or focal lesion. Sinuses/Orbits: No acute finding. Other: None. IMPRESSION: 1. Generalized cerebral atrophy. 2. No acute intracranial abnormality. Electronically Signed   By: Aram Candela M.D.   On: 08/01/2019 20:08   CT Angio Chest PE W/Cm &/Or Wo Cm  Result Date: 08/15/2019 CLINICAL DATA:  Short of breath, elevated D dimer EXAM: CT ANGIOGRAPHY CHEST WITH CONTRAST TECHNIQUE: Multidetector CT imaging of the chest was performed using the standard protocol during bolus administration of intravenous contrast. Multiplanar CT image reconstructions and MIPs were obtained to evaluate the vascular anatomy. CONTRAST:  OMNIPAQUE IOHEXOL 350 MG/ML SOLN COMPARISON:  08/15/2019 FINDINGS: Cardiovascular: This is a technically adequate evaluation of the pulmonary vasculature. There are no filling defects or pulmonary emboli. Heart is enlarged without pericardial effusion. Thoracic aorta is normal in caliber. Mediastinum/Nodes: Multiple subcentimeter lymph nodes within the mediastinum and hilar nonspecific. No pathologic adenopathy. Lungs/Pleura: There are bilateral pleural effusions volume estimated less than 1 L each. Significant compressive atelectasis within the right lower lobe. Central airways are patent. No pneumothorax. Upper Abdomen: No acute abnormality. Musculoskeletal: No acute or destructive bony lesions. Reconstructed images demonstrate no additional findings. Review of the MIP images confirms the above findings. IMPRESSION: 1. No evidence of pulmonary embolus. 2. Bilateral pleural effusions with lower lobe compressive atelectasis. 3. Cardiomegaly. Electronically Signed   By: Sharlet Salina M.D.   On: 08/15/2019 21:55   MR BRAIN WO CONTRAST  Result Date:  08/02/2019 CLINICAL DATA:  64 year old male with multiple falls. Encephalopathy, fatigue. EXAM: MRI HEAD WITHOUT CONTRAST TECHNIQUE: Multiplanar, multiecho pulse sequences of the brain and surrounding structures were obtained without intravenous contrast. COMPARISON:  Head CT yesterday. FINDINGS: Brain: Confluent left superior frontal lobe white matter gliosis and encephalomalacia in continuity with the left lateral ventricle. No restricted diffusion to suggest acute infarction. No midline shift, mass effect, evidence of mass lesion, ventriculomegaly, extra-axial collection or acute intracranial hemorrhage. Cervicomedullary junction and pituitary are within normal limits. Additional Patchy and confluent bilateral cerebral white matter T2 and FLAIR hyperintensity. No cortical encephalomalacia or chronic cerebral blood products identified. The deep gray nuclei, brainstem and cerebellum are within normal limits. Vascular: Major intracranial vascular flow voids are preserved. Skull and upper cervical spine: Negative visible cervical spine. Visualized bone marrow signal is within normal limits. Sinuses/Orbits: Negative orbits. Trace paranasal sinus mucosal thickening. Other: Mastoids are clear. Visible internal auditory structures appear  normal. Scalp and face soft tissues appear negative. IMPRESSION: 1. No acute intracranial abnormality. 2. 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 Additional moderate for age cerebral white matter signal changes, most commonly due to chronic small vessel disease. Electronically Signed   By: Odessa Fleming M.D.   On: 08/02/2019 07:44   DG Chest Portable 1 View  Result Date: 08/15/2019 CLINICAL DATA:  Dyspnea. EXAM: PORTABLE CHEST 1 VIEW COMPARISON:  08/01/2019 FINDINGS: The heart size remains enlarged. There is no pneumothorax. There may be a trace right-sided pleural effusion. No acute osseous abnormality. IMPRESSION: No active disease. Electronically  Signed   By: Katherine Mantle M.D.   On: 08/15/2019 20:12   DG Chest Portable 1 View  Result Date: 08/01/2019 CLINICAL DATA:  Change in mental status EXAM: PORTABLE CHEST 1 VIEW COMPARISON:  None. FINDINGS: The heart size and mediastinal contours are within normal limits. Both lungs are clear. The visualized skeletal structures are unremarkable. IMPRESSION: No active disease. Electronically Signed   By: Jonna Clark M.D.   On: 08/01/2019 17:46   ECHOCARDIOGRAM COMPLETE  Result Date: 08/16/2019    ECHOCARDIOGRAM REPORT   Patient Name:   TARRANCE JANUSZEWSKI Date of Exam: 08/16/2019 Medical Rec #:  [id number]      Height:       69.0 in Accession #:    [id number]     Weight:       209.7 lb Date of Birth:  April 01, 1956       BSA:          2.11 m Patient Age:    63 years       BP:           108/73 mmHg Patient Gender: M              HR:           90 bpm. [facility] Procedure: 2D Echo Indications:    Congestive Heart Failure 428.0 / I50.9  History:        Patient has no prior history of Echocardiogram examinations.                 CHF; Risk Factors:Non-Smoker, Dyslipidemia and Diabetes.                 Hyperglycemia due to diabetes mellitus.  Sonographer:    Jeryl Columbia RDCS (AE) Referring Phys: [id number] ASIA B ZIERLE-GHOSH IMPRESSIONS  1. Left ventricular ejection fraction, by estimation, is 20%. The left ventricle has severely decreased function. The left ventricle demonstrates global hypokinesis with regional variation.  2. Large LV mural thrombus in distal anterolateral position, approximately 2.0 x 2.5 cm (trabecular muscle obscures extent to some degree). Reported to Dr. Mariea Clonts.  3. Right ventricular systolic function is moderately reduced. The right ventricular size is normal. There is mildly elevated pulmonary artery systolic pressure. The estimated right ventricular systolic pressure is 32.4 mmHg.  4. Left atrial size was mildly dilated.  5. Right atrial size was moderately dilated.  6. The  mitral valve is grossly normal. Trivial mitral valve regurgitation.  7. The aortic valve is tricuspid. Aortic valve regurgitation is not visualized.  8. The inferior vena cava is normal in size with greater than 50% respiratory variability, suggesting right atrial pressure of 3 mmHg. FINDINGS  Left Ventricle: Left ventricular ejection fraction, by estimation, is 20%. The left ventricle has severely decreased function. The left ventricle demonstrates global hypokinesis. The left ventricular internal cavity  size was normal in size. There is no left ventricular hypertrophy. Left ventricular diastolic parameters are consistent with Grade II diastolic dysfunction (pseudonormalization). Right Ventricle: The right ventricular size is normal. No increase in right ventricular wall thickness. Right ventricular systolic function is moderately reduced. There is mildly elevated pulmonary artery systolic pressure. The tricuspid regurgitant velocity is 2.71 m/s, and with an assumed right atrial pressure of 3 mmHg, the estimated right ventricular systolic pressure is 08.6 mmHg. Left Atrium: Left atrial size was mildly dilated. Right Atrium: Right atrial size was moderately dilated. Pericardium: Trivial pericardial effusion is present. The pericardial effusion is posterior to the left ventricle. Mitral Valve: The mitral valve is grossly normal. Trivial mitral valve regurgitation. Tricuspid Valve: The tricuspid valve is grossly normal. Tricuspid valve regurgitation is mild. Aortic Valve: The aortic valve is tricuspid. Aortic valve regurgitation is not visualized. Pulmonic Valve: The pulmonic valve was grossly normal. Pulmonic valve regurgitation is not visualized. Aorta: The aortic root is normal in size and structure. Venous: The inferior vena cava is normal in size with greater than 50% respiratory variability, suggesting right atrial pressure of 3 mmHg. IAS/Shunts: No atrial level shunt detected by color flow Doppler.  LEFT  VENTRICLE PLAX 2D LVIDd:         5.32 cm  Diastology LVIDs:         5.07 cm  LV e' lateral:   8.05 cm/s LV PW:         1.09 cm  LV E/e' lateral: 10.8 LV IVS:        0.86 cm  LV e' medial:    5.98 cm/s LVOT diam:     2.00 cm  LV E/e' medial:  14.6 LV SV Index:   6.61 LVOT Area:     3.14 cm  RIGHT VENTRICLE RV S prime:     9.36 cm/s TAPSE (M-mode): 1.8 cm LEFT ATRIUM             Index       RIGHT ATRIUM           Index LA diam:        3.70 cm 1.76 cm/m  RA Area:     28.20 cm LA Vol (A2C):   83.1 ml 39.42 ml/m RA Volume:   106.00 ml 50.29 ml/m LA Vol (A4C):   56.9 ml 26.99 ml/m LA Biplane Vol: 71.2 ml 33.78 ml/m   AORTA Ao Root diam: 3.40 cm MITRAL VALVE               TRICUSPID VALVE MV Area (PHT): 4.10 cm    TR Peak grad:   29.4 mmHg MV Decel Time: 185 msec    TR Vmax:        271.00 cm/s MV E velocity: 87.30 cm/s MV A velocity: 65.00 cm/s  SHUNTS MV E/A ratio:  1.34        Systemic Diam: 2.00 cm Rozann Lesches MD Electronically signed by Rozann Lesches MD Signature Date/Time: 08/16/2019/11:45:40 AM    Final      CBC Recent Labs  Lab 08/11/19 0859 08/11/19 1747 08/15/19 1955 08/16/19 0557  WBC 7.4 7.9 9.4 8.7  HGB 14.8 14.8 13.8 13.7  HCT 45.2 45.1 42.7 42.9  PLT 163 153 157 129*  MCV 96.8 97.2 99.3 99.1  MCH 31.7 31.9 32.1 31.6  MCHC 32.7 32.8 32.3 31.9  RDW 13.3 14.9 15.8* 15.9*  LYMPHSABS  --   --  1.5 2.0  MONOABS  --   --  0.8 0.8  EOSABS  --   --  0.0 0.0  BASOSABS  --   --  0.0 0.0    Chemistries  Recent Labs  Lab 08/11/19 0859 08/11/19 1747 08/15/19 1955 08/16/19 0557 08/17/19 0548  NA 132* 125* 131* 136 138  K 5.1 5.0 5.0 3.6 4.0  CL 96* 91* 100 101 101  CO2 25 22 22 23 27   GLUCOSE 499* 562* 415* 238* 55*  BUN 16 24* 18 16 21   CREATININE 1.01 1.09 1.11 0.85 0.94  CALCIUM 8.8 8.6* 8.7* 8.8* 8.5*  MG  --   --   --  1.7  --   AST 37*  --  45* 46*  --   ALT 68*  --  69* 65*  --   ALKPHOS  --   --  422* 432*  --   BILITOT 1.1  --  1.9* 1.6*  --     ----------------------------------------------------------------------------------------------------------------- No results for input(s): CHOL, HDL, LDLCALC, TRIG, CHOLHDL, LDLDIRECT in the last 72 hours.  Lab Results  Component Value Date   HGBA1C >18.5 (H) 08/01/2019   ----------------------------------------------------------------------------------------------------------------- Recent Labs    08/16/19 0557  TSH 15.913*   ------------------------------------------------------------------------------------------------------------------ No results for input(s): VITAMINB12, FOLATE, FERRITIN, TIBC, IRON, RETICCTPCT in the last 72 hours.  Coagulation profile No results for input(s): INR, PROTIME in the last 168 hours.  Recent Labs    08/15/19 1955  DDIMER 1.60*    Cardiac Enzymes No results for input(s): CKMB, TROPONINI, MYOGLOBIN in the last 168 hours.  Invalid input(s): CK ------------------------------------------------------------------------------------------------------------------    Component Value Date/Time   BNP 1,165.0 (H) 08/15/2019 1955    08/17/19 M.D on 08/17/2019 at 7:28 PM  Go to [email or web address] - for contact info  [facility] - Office  [phone]

## 2019-08-17 NOTE — Progress Notes (Signed)
MD made aware of bp 72/58, MAP 65 via monitor. Manual bp 82/60.

## 2019-08-17 NOTE — Plan of Care (Signed)
  Problem: Acute Rehab PT Goals(only PT should resolve) Goal: Pt Will Go Supine/Side To Sit Outcome: Progressing Flowsheets (Taken 08/17/2019 1514) Pt will go Supine/Side to Sit: Independently Goal: Patient Will Transfer Sit To/From Stand Outcome: Progressing Flowsheets (Taken 08/17/2019 1514) Patient will transfer sit to/from stand: with modified independence Goal: Pt Will Transfer Bed To Chair/Chair To Bed Outcome: Progressing Flowsheets (Taken 08/17/2019 1514) Pt will Transfer Bed to Chair/Chair to Bed: with modified independence Goal: Pt Will Ambulate Outcome: Progressing Flowsheets (Taken 08/17/2019 1514) Pt will Ambulate:  > 125 feet  with modified independence  with cane   3:15 PM, 08/17/19 Ocie Bob, MPT Physical Therapist with [facility] [phone] office [phone] mobile phone

## 2019-08-17 NOTE — Evaluation (Signed)
Physical Therapy Evaluation Patient Details Name: Stanley Levy MRN: [id number] DOB: March 04, 1956 Today's Date: 08/17/2019   History of Present Illness  Stanley Levy  is a 64 y.o. male, w/hx of thyroid disease, HLD, DMII, and anxiety presents with chief complaint of peripheral edema. Patient is a poor historian, and seems to have little insight into his medical conditions and medications at home. Patient reports that the onset of his symptoms was 24 hours ago. Patient reports that his legs were so swollen that he couldn't walk. He has associated dyspnea. He reports that standing up and exerting himself make both symptoms worse. He denies orthopnea. He denies chest pain, palpitations, and wheezing. Patient reports that he has not had anything like this before.    Clinical Impression  Patient functioning near baseline for functional mobility and gait, had difficulty completing sit to stands requiring multiple attempts with labored movement, once standing for 2-3 minutes, patient demonstrates improvement in balance and used SPC for occasional balancing during ambulation in room/hallway without loss of balance.  Patient tolerated sitting up in chair after therapy - RN aware.  Patient will benefit from continued physical therapy in hospital and recommended venue below to increase strength, balance, endurance for safe ADLs and gait.      Follow Up Recommendations Home health PT;Supervision - Intermittent    Equipment Recommendations  None recommended by PT    Recommendations for Other Services       Precautions / Restrictions Precautions Precautions: Fall Restrictions Weight Bearing Restrictions: No      Mobility  Bed Mobility Overal bed mobility: Modified Independent             General bed mobility comments: slightly increased time  Transfers Overall transfer level: Needs assistance Equipment used: Straight cane Transfers: Sit to/from Stand;Stand Pivot Transfers Sit to Stand:  Min guard Stand pivot transfers: Supervision       General transfer comment: had difficulty completing sit to stands due to BLE weakness  Ambulation/Gait Ambulation/Gait assistance: Supervision;Min guard Gait Distance (Feet): 125 Feet Assistive device: Straight cane Gait Pattern/deviations: Step-through pattern Gait velocity: WNL   General Gait Details: normal speed of cadence with slight forward lean, at time unsteady, but able to self correct, most of time not touching floor with SPC, no loss of balance  Stairs            Wheelchair Mobility    Modified Rankin (Stroke Patients Only)       Balance Overall balance assessment: Needs assistance Sitting-balance support: Feet supported;No upper extremity supported Sitting balance-Leahy Scale: Good Sitting balance - Comments: seated EOB   Standing balance support: During functional activity;Single extremity supported Standing balance-Leahy Scale: Fair Standing balance comment: fair/good using SPC                             Pertinent Vitals/Pain Pain Assessment: No/denies pain    Home Living Family/patient expects to be discharged to:: Private residence Living Arrangements: Spouse/significant other Available Help at Discharge: Family   Home Access: Stairs to enter Entrance Stairs-Rails: None Entrance Stairs-Number of Steps: 5 Home Layout: One level Home Equipment: Cane - single point      Prior Function Level of Independence: Independent with assistive device(s)         Comments: Tourist information centre manager with [facility]     Hand Dominance        Extremity/Trunk Assessment   Upper Extremity Assessment Upper Extremity Assessment: Overall WFL for  tasks assessed    Lower Extremity Assessment Lower Extremity Assessment: Generalized weakness    Cervical / Trunk Assessment Cervical / Trunk Assessment: Normal  Communication   Communication: No difficulties  Cognition Arousal/Alertness:  Awake/alert Behavior During Therapy: WFL for tasks assessed/performed Overall Cognitive Status: Within Functional Limits for tasks assessed                                        General Comments      Exercises     Assessment/Plan    PT Assessment Patient needs continued PT services  PT Problem List Decreased strength;Decreased activity tolerance;Decreased balance;Decreased mobility       PT Treatment Interventions DME instruction;Gait training;Stair training;Functional mobility training;Therapeutic activities;Therapeutic exercise;Patient/family education;Balance training    PT Goals (Current goals can be found in the Care Plan section)  Acute Rehab PT Goals Patient Stated Goal: return home with family to assist PT Goal Formulation: With patient Time For Goal Achievement: 08/19/19 Potential to Achieve Goals: Good    Frequency Min 3X/week   Barriers to discharge        Co-evaluation               AM-PAC PT "6 Clicks" Mobility  Outcome Measure Help needed turning from your back to your side while in a flat bed without using bedrails?: None Help needed moving from lying on your back to sitting on the side of a flat bed without using bedrails?: None Help needed moving to and from a bed to a chair (including a wheelchair)?: A Little Help needed standing up from a chair using your arms (e.g., wheelchair or bedside chair)?: A Little Help needed to walk in hospital room?: A Little Help needed climbing 3-5 steps with a railing? : A Little 6 Click Score: 20    End of Session   Activity Tolerance: Patient tolerated treatment well;Patient limited by fatigue Patient left: in chair;with call bell/phone within reach Nurse Communication: Mobility status PT Visit Diagnosis: Unsteadiness on feet (R26.81);Other abnormalities of gait and mobility (R26.89);Muscle weakness (generalized) (M62.81)    Time: 4627-0350 PT Time Calculation (min) (ACUTE ONLY): 31  min   Charges:   PT Evaluation $PT Eval Moderate Complexity: 1 Mod PT Treatments $Therapeutic Activity: 23-37 mins        3:07 PM, 08/17/19 Lonell Grandchild, MPT Physical Therapist with [facility] [phone] office [phone] mobile phone

## 2019-08-17 NOTE — Progress Notes (Signed)
Inpatient Diabetes Program Recommendations  AACE/ADA: New Consensus Statement on Inpatient Glycemic Control   Target Ranges:  Prepandial:   less than 140 mg/dL      Peak postprandial:   less than 180 mg/dL (1-2 hours)      Critically ill patients:  140 - 180 mg/dL  Results for Stanley Levy, Stanley Levy (MRN [id number]) as of 08/17/2019 09:19  Ref. Range 08/16/2019 07:28 08/16/2019 11:30 08/16/2019 16:13 08/16/2019 21:33 08/17/2019 07:31 08/17/2019 07:48  Glucose-Capillary Latest Ref Range: 70 - 99 mg/dL 409 (H)  Novolog 11 units 362 (H)  Novolog 20 units 133 (H)  Novolog 3 units 64 (L) 63 (L) 79    Review of Glycemic Control  Diabetes history: DM2 Outpatient Diabetes medications: 70/30 25 units BID Current orders for Inpatient glycemic control: Levemir 5 units QHS, Novolog 0-15 units TID with meals, Novolog 0-5 units QHS    Inpatient Diabetes Program Recommendations:   Insulin-Basal: Noted patient did NOT receive Levemir last night and fasting glucose 63 mg/dl today.  Please consider discontinuing Levemir and ordering 70/30 7 units BID (dose will provide a total of 9.8 units for basal and 4.2 units for meal coverage per day).  Insulin-Correction: Please consider decreasing Novolog correction to sensitive scale (0-9 units TID with meals).  Thanks, Orlando Penner, RN, MSN, CDE Diabetes Coordinator Inpatient Diabetes Program [phone] (Team Pager from 8am to 5pm)

## 2019-08-18 ENCOUNTER — Encounter (HOSPITAL_COMMUNITY): Payer: Self-pay | Admitting: Family Medicine

## 2019-08-18 DIAGNOSIS — E039 Hypothyroidism, unspecified: Secondary | ICD-10-CM | POA: Diagnosis present

## 2019-08-18 DIAGNOSIS — R4189 Other symptoms and signs involving cognitive functions and awareness: Secondary | ICD-10-CM | POA: Diagnosis present

## 2019-08-18 DIAGNOSIS — I519 Heart disease, unspecified: Secondary | ICD-10-CM

## 2019-08-18 DIAGNOSIS — I513 Intracardiac thrombosis, not elsewhere classified: Secondary | ICD-10-CM

## 2019-08-18 DIAGNOSIS — I5021 Acute systolic (congestive) heart failure: Secondary | ICD-10-CM

## 2019-08-18 DIAGNOSIS — E785 Hyperlipidemia, unspecified: Secondary | ICD-10-CM

## 2019-08-18 DIAGNOSIS — E782 Mixed hyperlipidemia: Secondary | ICD-10-CM

## 2019-08-18 LAB — BASIC METABOLIC PANEL
Anion gap: 14 (ref 5–15)
BUN: 29 mg/dL — ABNORMAL HIGH (ref 8–23)
CO2: 21 mmol/L — ABNORMAL LOW (ref 22–32)
Calcium: 8.3 mg/dL — ABNORMAL LOW (ref 8.9–10.3)
Chloride: 98 mmol/L (ref 98–111)
Creatinine, Ser: 1.1 mg/dL (ref 0.61–1.24)
GFR calc Af Amer: >60 mL/min (ref >60)
GFR calc non Af Amer: >60 mL/min (ref >60)
Glucose, Bld: 155 mg/dL — ABNORMAL HIGH (ref 70–99)
Potassium: 3.6 mmol/L (ref 3.5–5.1)
Sodium: 133 mmol/L — ABNORMAL LOW (ref 135–145)

## 2019-08-18 LAB — CBC
HCT: 42 % (ref 39.0–52.0)
Hemoglobin: 13.7 g/dL (ref 13.0–17.0)
MCH: 31.7 pg (ref 26.0–34.0)
MCHC: 32.6 g/dL (ref 30.0–36.0)
MCV: 97.2 fL (ref 80.0–100.0)
Platelets: 147 10*3/uL — ABNORMAL LOW (ref 150–400)
RBC: 4.32 MIL/uL (ref 4.22–5.81)
RDW: 15.2 % (ref 11.5–15.5)
WBC: 6.9 10*3/uL (ref 4.0–10.5)
nRBC: 0 % (ref 0.0–0.2)

## 2019-08-18 LAB — GLUCOSE, CAPILLARY
Glucose-Capillary: 134 mg/dL — ABNORMAL HIGH (ref 70–99)
Glucose-Capillary: 304 mg/dL — ABNORMAL HIGH (ref 70–99)
Glucose-Capillary: 332 mg/dL — ABNORMAL HIGH (ref 70–99)
Glucose-Capillary: 270 mg/dL — ABNORMAL HIGH (ref 70–99)

## 2019-08-18 MED ORDER — FUROSEMIDE 10 MG/ML IJ SOLN
20.0000 mg | Freq: Two times a day (BID) | INTRAMUSCULAR | Status: DC
  Administered 2019-08-18 – 2019-08-19 (×3): 20 mg via INTRAVENOUS
  Filled 2019-08-18 (×3): qty 2

## 2019-08-18 MED ORDER — LISINOPRIL 5 MG PO TABS: 2.5000 mg | ORAL_TABLET | Freq: Every day | ORAL | Status: DC

## 2019-08-18 NOTE — Progress Notes (Signed)
New order per MD to hold diuretics, Coreg and Lisinopril this morning d/t sbp in the 80's. Pt has no distress. Call bell in reach.

## 2019-08-18 NOTE — Progress Notes (Signed)
Patient Demographics:    Stanley Levy, is a 64 y.o. male, DOB - 02-26-1956, GBT:[id number]  Admit date - 08/15/2019   Admitting Physician Lilyan Gilford, DO  Outpatient Primary MD for the patient is Freddy Finner, NP  LOS - 3   Chief Complaint  Patient presents with   Shortness of Breath        Subjective:    Stanley Levy today has no fevers, no emesis,  No chest pain,   -no shob No fever  Or chills  No Nausea, Vomiting or Diarrhea   Assessment  & Plan :    Principal Problem:   Intracardiac thrombosis Active Problems:   Uncontrolled Type 2 diabetes mellitus with hyperglycemia (HCC)   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 The left ventricle has severely decreased function. The left ventricle demonstrates global hypokinesis with regional variation.  2. Large LV Mural Thrombus in Distal anterolateral position,  approximately 2.0 x 2.5 cm (trabecular muscle obscures extent to some degree).   Brief Summary:- 64 y.o.male,w/hx of hypothyroidism, HLD, uncontrolled DMII, and anxiety Admitted on 08/15/2017 with dyspnea and found to have CHF with EF of 20% and large LV mural thrombus  A/p 1)HFrEF--- EF 20%--patient with acute exacerbation of combined systolic and diastolic dysfunction CHF -Blood pressure has been soft -Decrease Lasix to 20 mg IV twice daily with hold parameters -Decrease lisinopril to 2.5 mg daily with hold parameters -Continue Coreg 3.125 mg twice a day with hold parameters -Patient remains volume overloaded -Use Ace wraps for lower extremities and keep lower extremities elevated -EF 20%, Intracardiac Thrombus and  soft BP makes medication management rather challenging -Cardiology consult requested  2)Intracardiac Clot/Large LV thrombus--- Eliquis per pharmacy  3)H/o Prior CVA----  MRI Brain from 08/02/19--- with Chronic Lt Frontal Lobe encephalomalacia (remote infarct)--- aspirin, Crestor and Eliquis as ordered -Patient with some mild memory/cognitive concerns  4)DM2- A1C is 18.5,  reflecting uncontrolled DM, Lantus insulin as ordered, Use Novolog/Humalog Sliding scale insulin with Accu-Cheks/Fingersticks as ordered   5)Hypothyroidism--- TSH is 15.9, increased levothyroxine to 37.5 mcg from 25   6)Social/Ethics--- plan of care discussed with patient and wife, patient is a full code, 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 who has a history of seizures and strokes and disability -Palliative care consult requested  7)Generalized weakness/deconditioning--- PT eval appreciated recommends home health PT  Disposition/Need for in-Hospital Stay- patient unable to be discharged at this time due to --- significant volume overload requiring IV diuresis-- EF 20%, Intracardiac Thrombus and soft BP makes medication management rather challenging -Remains volume overloaded, not medically ready for discharge home with home health at this time  Code Status : Full  Family Communication:    (patient is alert, awake and coherent) Discussed with wife  Consults  :  na  DVT Prophylaxis  : Eliquis- SCDs   Lab Results  Component Value Date   PLT 147 (L) 08/18/2019   Inpatient Medications  Scheduled Meds:  apixaban  10 mg Oral BID  Followed by   Melene Muller ON 08/23/2019] apixaban  5 mg Oral BID   aspirin EC  81 mg Oral Daily   carvedilol  3.125 mg Oral BID WC   furosemide  20 mg Intravenous BID   gabapentin  100 mg Oral TID   imipramine  50 mg Oral QHS   insulin aspart  0-15 Units Subcutaneous TID WC   insulin aspart  0-5 Units Subcutaneous QHS   insulin detemir  5 Units  Subcutaneous QHS   levothyroxine  37.5 mcg Oral QAC breakfast   lisinopril  2.5 mg Oral Daily   rosuvastatin  5 mg Oral q1800   sodium chloride flush  3 mL Intravenous Q12H   Continuous Infusions:  sodium chloride     PRN Meds:.sodium chloride, acetaminophen, ondansetron (ZOFRAN) IV, sodium chloride flush    Anti-infectives (From admission, onward)   None        Objective:   Vitals:   08/17/19 2054 08/18/19 0200 08/18/19 0503 08/18/19 0852  BP: (!) 72/58 98/60 (!) 86/63   Pulse: 80  85   Resp: 16  16   Temp: 97.8 F (36.6 C)  (!) 97.3 F (36.3 C)   TempSrc: Oral  Oral   SpO2:   97% 97%  Weight:   96.8 kg     Wt Readings from Last 3 Encounters:  08/18/19 96.8 kg  08/12/19 93 kg  08/11/19 93 kg     Intake/Output Summary (Last 24 hours) at 08/18/2019 1124 Last data filed at 08/18/2019 0900 Gross per 24 hour  Intake 1220.31 ml  Output 250 ml  Net 970.31 ml     Physical Exam  Gen:- Awake Alert,  In no apparent distress  HEENT:- [place].AT, No sclera icterus Neck-Supple Neck,No JVD,.  Lungs-diminished air movement with bibasilar rales CV- S1, S2 normal, regular , 2/6 SM Abd-  +ve B.Sounds, Abd Soft, No tenderness,    Extremity/Skin:-2-3+ pitting edema, weepy legs, pedal pulses present  Psych-affect is appropriate, oriented x 3 (subtle cognitive and memory deficits) Neuro-generalized weakness, no new focal deficits, no tremors   Data Review:   Micro Results Recent Results (from the past 240 hour(s))  Respiratory Panel by RT PCR (Flu A&B, Covid) - Nasopharyngeal Swab     Status: None   Collection Time: 08/15/19  9:52 PM   Specimen: Nasopharyngeal Swab  Result Value Ref Range Status   SARS Coronavirus 2 by RT PCR NEGATIVE NEGATIVE Final    Comment: (NOTE) SARS-CoV-2 target nucleic acids are NOT DETECTED. The SARS-CoV-2 RNA is generally detectable in upper respiratoy specimens during the acute phase of infection. The lowest concentration of SARS-CoV-2  viral copies this assay can detect is 131 copies/mL. A negative result does not preclude SARS-Cov-2 infection and should not be used as the sole basis for treatment or other patient management decisions. A negative result may occur with  improper specimen collection/handling, submission of specimen other than nasopharyngeal swab, presence of viral mutation(s) within the areas targeted by this assay, and inadequate number of viral copies (<131 copies/mL). A negative result must be combined with clinical observations, patient history, and epidemiological information. The expected result is Negative. Fact Sheet for Patients:  https://www.moore.com/ Fact Sheet for Healthcare Providers:  https://www.young.biz/ This test is not yet ap proved or cleared by the [place] FDA and  has been authorized for detection and/or diagnosis of SARS-CoV-2 by FDA under an Emergency Use Authorization (EUA). This EUA will remain  in effect (meaning this test can be used)  for the duration of the COVID-19 declaration under Section 564(b)(1) of the Act, 21 U.S.C. section 360bbb-3(b)(1), unless the authorization is terminated or revoked sooner.    Influenza A by PCR NEGATIVE NEGATIVE Final   Influenza B by PCR NEGATIVE NEGATIVE Final    Comment: (NOTE) The Xpert Xpress SARS-CoV-2/FLU/RSV assay is intended as an aid in  the diagnosis of influenza from Nasopharyngeal swab specimens and  should not be used as a sole basis for treatment. Nasal washings and  aspirates are unacceptable for Xpert Xpress SARS-CoV-2/FLU/RSV  testing. Fact Sheet for Patients: https://www.moore.com/ Fact Sheet for Healthcare Providers: https://www.young.biz/ This test is not yet approved or cleared by the [place] FDA and  has been authorized for detection and/or diagnosis of SARS-CoV-2 by  FDA under an Emergency Use Authorization (EUA). This EUA will  remain  in effect (meaning this test can be used) for the duration of the  Covid-19 declaration under Section 564(b)(1) of the Act, 21  U.S.C. section 360bbb-3(b)(1), unless the authorization is  terminated or revoked. Performed at [facility], [address], [place], [place] [address]     Radiology Reports CT HEAD WO CONTRAST  Result Date: 08/01/2019 CLINICAL DATA:  Multiple falls. EXAM: CT HEAD WITHOUT CONTRAST TECHNIQUE: Contiguous axial images were obtained from the base of the skull through the vertex without intravenous contrast. COMPARISON:  None. FINDINGS: Brain: There is moderate severity cerebral atrophy with widening of the extra-axial spaces and ventricular dilatation. There are areas of decreased attenuation within the white matter tracts of the supratentorial brain, consistent with microvascular disease changes. Adjacent 1 mm and 2 mm foci of white matter calcification are seen along the posterior aspect of the centrum semiovale on the left. Vascular: No hyperdense vessel or unexpected calcification. Skull: Normal. Negative for fracture or focal lesion. Sinuses/Orbits: No acute finding. Other: None. IMPRESSION: 1. Generalized cerebral atrophy. 2. No acute intracranial abnormality. Electronically Signed   By: Aram Candela M.D.   On: 08/01/2019 20:08   CT Angio Chest PE W/Cm &/Or Wo Cm  Result Date: 08/15/2019 CLINICAL DATA:  Short of breath, elevated D dimer EXAM: CT ANGIOGRAPHY CHEST WITH CONTRAST TECHNIQUE: Multidetector CT imaging of the chest was performed using the standard protocol during bolus administration of intravenous contrast. Multiplanar CT image reconstructions and MIPs were obtained to evaluate the vascular anatomy. CONTRAST:  OMNIPAQUE IOHEXOL 350 MG/ML SOLN COMPARISON:  08/15/2019 FINDINGS: Cardiovascular: This is a technically adequate evaluation of the pulmonary vasculature. There are no filling defects or pulmonary emboli. Heart is enlarged without  pericardial effusion. Thoracic aorta is normal in caliber. Mediastinum/Nodes: Multiple subcentimeter lymph nodes within the mediastinum and hilar nonspecific. No pathologic adenopathy. Lungs/Pleura: There are bilateral pleural effusions volume estimated less than 1 L each. Significant compressive atelectasis within the right lower lobe. Central airways are patent. No pneumothorax. Upper Abdomen: No acute abnormality. Musculoskeletal: No acute or destructive bony lesions. Reconstructed images demonstrate no additional findings. Review of the MIP images confirms the above findings. IMPRESSION: 1. No evidence of pulmonary embolus. 2. Bilateral pleural effusions with lower lobe compressive atelectasis. 3. Cardiomegaly. Electronically Signed   By: Sharlet Salina M.D.   On: 08/15/2019 21:55   MR BRAIN WO CONTRAST  Result Date: 08/02/2019 CLINICAL DATA:  64 year old male with multiple falls. Encephalopathy, fatigue. EXAM: MRI HEAD WITHOUT CONTRAST TECHNIQUE: Multiplanar, multiecho pulse sequences of the brain and surrounding structures were obtained without intravenous contrast. COMPARISON:  Head CT yesterday. FINDINGS: Brain: Confluent left superior frontal lobe white  matter gliosis and encephalomalacia in continuity with the left lateral ventricle. No restricted diffusion to suggest acute infarction. No midline shift, mass effect, evidence of mass lesion, ventriculomegaly, extra-axial collection or acute intracranial hemorrhage. Cervicomedullary junction and pituitary are within normal limits. Additional Patchy and confluent bilateral cerebral white matter T2 and FLAIR hyperintensity. No cortical encephalomalacia or chronic cerebral blood products identified. The deep gray nuclei, brainstem and cerebellum are within normal limits. Vascular: Major intracranial vascular flow voids are preserved. Skull and upper cervical spine: Negative visible cervical spine. Visualized bone marrow signal is within normal limits.  Sinuses/Orbits: Negative orbits. Trace paranasal sinus mucosal thickening. Other: Mastoids are clear. Visible internal auditory structures appear normal. Scalp and face soft tissues appear negative. IMPRESSION: 1. No acute intracranial abnormality. 2. 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 Additional moderate for age cerebral white matter signal changes, most commonly due to chronic small vessel disease. Electronically Signed   By: Odessa Fleming M.D.   On: 08/02/2019 07:44   DG Chest Portable 1 View  Result Date: 08/15/2019 CLINICAL DATA:  Dyspnea. EXAM: PORTABLE CHEST 1 VIEW COMPARISON:  08/01/2019 FINDINGS: The heart size remains enlarged. There is no pneumothorax. There may be a trace right-sided pleural effusion. No acute osseous abnormality. IMPRESSION: No active disease. Electronically Signed   By: Katherine Mantle M.D.   On: 08/15/2019 20:12   DG Chest Portable 1 View  Result Date: 08/01/2019 CLINICAL DATA:  Change in mental status EXAM: PORTABLE CHEST 1 VIEW COMPARISON:  None. FINDINGS: The heart size and mediastinal contours are within normal limits. Both lungs are clear. The visualized skeletal structures are unremarkable. IMPRESSION: No active disease. Electronically Signed   By: Jonna Clark M.D.   On: 08/01/2019 17:46   ECHOCARDIOGRAM COMPLETE  Result Date: 08/16/2019    ECHOCARDIOGRAM REPORT   Patient Name:   Stanley Levy Date of Exam: 08/16/2019 Medical Rec #:  [id number]      Height:       69.0 in Accession #:    [id number]     Weight:       209.7 lb Date of Birth:  June 19, 1956       BSA:          2.11 m Patient Age:    63 years       BP:           108/73 mmHg Patient Gender: M              HR:           90 bpm. [facility] Procedure: 2D Echo Indications:    Congestive Heart Failure 428.0 / I50.9  History:        Patient has no prior history of Echocardiogram examinations.                 CHF; Risk Factors:Non-Smoker, Dyslipidemia and  Diabetes.                 Hyperglycemia due to diabetes mellitus.  Sonographer:    Jeryl Columbia RDCS (AE) Referring Phys: [id number] ASIA B ZIERLE-GHOSH IMPRESSIONS  1. Left ventricular ejection fraction, by estimation, is 20%. The left ventricle has severely decreased function. The left ventricle demonstrates global hypokinesis with regional variation.  2. Large LV mural thrombus in distal anterolateral position, approximately 2.0 x 2.5 cm (trabecular muscle obscures extent to some degree). Reported to Dr. Mariea Clonts.  3. Right ventricular systolic function is moderately reduced. The  right ventricular size is normal. There is mildly elevated pulmonary artery systolic pressure. The estimated right ventricular systolic pressure is 32.4 mmHg.  4. Left atrial size was mildly dilated.  5. Right atrial size was moderately dilated.  6. The mitral valve is grossly normal. Trivial mitral valve regurgitation.  7. The aortic valve is tricuspid. Aortic valve regurgitation is not visualized.  8. The inferior vena cava is normal in size with greater than 50% respiratory variability, suggesting right atrial pressure of 3 mmHg. FINDINGS  Left Ventricle: Left ventricular ejection fraction, by estimation, is 20%. The left ventricle has severely decreased function. The left ventricle demonstrates global hypokinesis. The left ventricular internal cavity size was normal in size. There is no left ventricular hypertrophy. Left ventricular diastolic parameters are consistent with Grade II diastolic dysfunction (pseudonormalization). Right Ventricle: The right ventricular size is normal. No increase in right ventricular wall thickness. Right ventricular systolic function is moderately reduced. There is mildly elevated pulmonary artery systolic pressure. The tricuspid regurgitant velocity is 2.71 m/s, and with an assumed right atrial pressure of 3 mmHg, the estimated right ventricular systolic pressure is 32.4 mmHg. Left Atrium: Left atrial  size was mildly dilated. Right Atrium: Right atrial size was moderately dilated. Pericardium: Trivial pericardial effusion is present. The pericardial effusion is posterior to the left ventricle. Mitral Valve: The mitral valve is grossly normal. Trivial mitral valve regurgitation. Tricuspid Valve: The tricuspid valve is grossly normal. Tricuspid valve regurgitation is mild. Aortic Valve: The aortic valve is tricuspid. Aortic valve regurgitation is not visualized. Pulmonic Valve: The pulmonic valve was grossly normal. Pulmonic valve regurgitation is not visualized. Aorta: The aortic root is normal in size and structure. Venous: The inferior vena cava is normal in size with greater than 50% respiratory variability, suggesting right atrial pressure of 3 mmHg. IAS/Shunts: No atrial level shunt detected by color flow Doppler.  LEFT VENTRICLE PLAX 2D LVIDd:         5.32 cm  Diastology LVIDs:         5.07 cm  LV e' lateral:   8.05 cm/s LV PW:         1.09 cm  LV E/e' lateral: 10.8 LV IVS:        0.86 cm  LV e' medial:    5.98 cm/s LVOT diam:     2.00 cm  LV E/e' medial:  14.6 LV SV Index:   6.61 LVOT Area:     3.14 cm  RIGHT VENTRICLE RV S prime:     9.36 cm/s TAPSE (M-mode): 1.8 cm LEFT ATRIUM             Index       RIGHT ATRIUM           Index LA diam:        3.70 cm 1.76 cm/m  RA Area:     28.20 cm LA Vol (A2C):   83.1 ml 39.42 ml/m RA Volume:   106.00 ml 50.29 ml/m LA Vol (A4C):   56.9 ml 26.99 ml/m LA Biplane Vol: 71.2 ml 33.78 ml/m   AORTA Ao Root diam: 3.40 cm MITRAL VALVE               TRICUSPID VALVE MV Area (PHT): 4.10 cm    TR Peak grad:   29.4 mmHg MV Decel Time: 185 msec    TR Vmax:        271.00 cm/s MV E velocity: 87.30 cm/s MV A velocity: 65.00 cm/s  SHUNTS  MV E/A ratio:  1.34        Systemic Diam: 2.00 cm Rozann Lesches MD Electronically signed by Rozann Lesches MD Signature Date/Time: 08/16/2019/11:45:40 AM    Final      CBC Recent Labs  Lab 08/11/19 1747 08/15/19 1955 08/16/19 0557  08/18/19 0526  WBC 7.9 9.4 8.7 6.9  HGB 14.8 13.8 13.7 13.7  HCT 45.1 42.7 42.9 42.0  PLT 153 157 129* 147*  MCV 97.2 99.3 99.1 97.2  MCH 31.9 32.1 31.6 31.7  MCHC 32.8 32.3 31.9 32.6  RDW 14.9 15.8* 15.9* 15.2  LYMPHSABS  --  1.5 2.0  --   MONOABS  --  0.8 0.8  --   EOSABS  --  0.0 0.0  --   BASOSABS  --  0.0 0.0  --     Chemistries  Recent Labs  Lab 08/11/19 1747 08/15/19 1955 08/16/19 0557 08/17/19 0548 08/18/19 0526  NA 125* 131* 136 138 133*  K 5.0 5.0 3.6 4.0 3.6  CL 91* 100 101 101 98  CO2 22 22 23 27  21*  GLUCOSE 562* 415* 238* 55* 155*  BUN 24* 18 16 21  29*  CREATININE 1.09 1.11 0.85 0.94 1.10  CALCIUM 8.6* 8.7* 8.8* 8.5* 8.3*  MG  --   --  1.7  --   --   AST  --  45* 46*  --   --   ALT  --  69* 65*  --   --   ALKPHOS  --  422* 432*  --   --   BILITOT  --  1.9* 1.6*  --   --    ----------------------------------------------------------------------------------------------------------------- No results for input(s): CHOL, HDL, LDLCALC, TRIG, CHOLHDL, LDLDIRECT in the last 72 hours.  Lab Results  Component Value Date   HGBA1C >18.5 (H) 08/01/2019   ----------------------------------------------------------------------------------------------------------------- Recent Labs    08/16/19 0557  TSH 15.913*   ------------------------------------------------------------------------------------------------------------------ No results for input(s): VITAMINB12, FOLATE, FERRITIN, TIBC, IRON, RETICCTPCT in the last 72 hours.  Coagulation profile No results for input(s): INR, PROTIME in the last 168 hours.  Recent Labs    08/15/19 1955  DDIMER 1.60*    Cardiac Enzymes No results for input(s): CKMB, TROPONINI, MYOGLOBIN in the last 168 hours.  Invalid input(s): CK ------------------------------------------------------------------------------------------------------------------    Component Value Date/Time   BNP 1,165.0 (H) 08/15/2019 1955    Roxan Hockey M.D on 08/18/2019 at 11:24 AM  Go to [email or web address] - for contact info  [facility] - Office  [phone]

## 2019-08-18 NOTE — Progress Notes (Signed)
PT Cancellation Note  Patient Details Name: Stanley Levy MRN: [id number] DOB: 1956/01/27   Cancelled Treatment:    Reason Eval/Treat Not Completed: Medical issues which prohibited therapy; Upon entrance to room patient was very SOB, consulted nursing who stated to hold therapy for today and she would update MD.    3:23 PM, 08/18/19 Wyman Songster PT, DPT Physical Therapist at [facility]

## 2019-08-18 NOTE — Progress Notes (Signed)
Palliative-   Consult received. Chart reviewed. Attempted to contact patient by phone- unable to reach. Will see patient tomorrow and complete face to face consult.   Thank you for this consult.   Ocie Bob, AGNP-C Palliative Medicine  Please call Palliative Medicine team phone with any questions [phone]. For individual providers please see AMION.  No charge
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
## 2019-08-18 NOTE — Progress Notes (Signed)
MD made aware of bp 86/53.

## 2019-08-18 NOTE — Clinical Social Work Note (Signed)
Stanley Levy with [facility] accepts patient for HHPT and is completing a single case agreement with his insurance company to have his copay waived.   Ineta Sinning, Juleen China, LCSW

## 2019-08-19 ENCOUNTER — Inpatient Hospital Stay (HOSPITAL_COMMUNITY): Payer: PPO

## 2019-08-19 DIAGNOSIS — Z0000 Encounter for general adult medical examination without abnormal findings: Secondary | ICD-10-CM

## 2019-08-19 DIAGNOSIS — Z515 Encounter for palliative care: Secondary | ICD-10-CM

## 2019-08-19 DIAGNOSIS — I429 Cardiomyopathy, unspecified: Secondary | ICD-10-CM

## 2019-08-19 DIAGNOSIS — Z7189 Other specified counseling: Secondary | ICD-10-CM

## 2019-08-19 LAB — BASIC METABOLIC PANEL
Anion gap: 11 (ref 5–15)
BUN: 32 mg/dL — ABNORMAL HIGH (ref 8–23)
CO2: 24 mmol/L (ref 22–32)
Calcium: 8.3 mg/dL — ABNORMAL LOW (ref 8.9–10.3)
Chloride: 98 mmol/L (ref 98–111)
Creatinine, Ser: 1.11 mg/dL (ref 0.61–1.24)
GFR calc Af Amer: >60 mL/min (ref >60)
GFR calc non Af Amer: >60 mL/min (ref >60)
Glucose, Bld: 85 mg/dL (ref 70–99)
Potassium: 3.5 mmol/L (ref 3.5–5.1)
Sodium: 133 mmol/L — ABNORMAL LOW (ref 135–145)

## 2019-08-19 LAB — GLUCOSE, CAPILLARY
Glucose-Capillary: 71 mg/dL (ref 70–99)
Glucose-Capillary: 169 mg/dL — ABNORMAL HIGH (ref 70–99)
Glucose-Capillary: 242 mg/dL — ABNORMAL HIGH (ref 70–99)
Glucose-Capillary: 376 mg/dL — ABNORMAL HIGH (ref 70–99)

## 2019-08-19 LAB — MAGNESIUM: Magnesium: 1.9 mg/dL (ref 1.7–2.4)

## 2019-08-19 LAB — PHOSPHORUS: Phosphorus: 4.5 mg/dL (ref 2.5–4.6)

## 2019-08-19 MED ORDER — DIGOXIN 0.25 MG/ML IJ SOLN
0.1250 mg | Freq: Every day | INTRAMUSCULAR | Status: DC
  Administered 2019-08-19 – 2019-08-21 (×3): 0.125 mg via INTRAVENOUS
  Filled 2019-08-19 (×4): qty 2

## 2019-08-19 MED ORDER — SPIRONOLACTONE 12.5 MG HALF TABLET
12.5000 mg | ORAL_TABLET | Freq: Every day | ORAL | Status: DC
  Administered 2019-08-19 – 2019-08-23 (×5): 12.5 mg via ORAL
  Filled 2019-08-19 (×6): qty 1

## 2019-08-19 MED ORDER — POLYETHYLENE GLYCOL 3350 17 G PO PACK
17.0000 g | PACK | Freq: Two times a day (BID) | ORAL | Status: DC
  Administered 2019-08-19 – 2019-08-23 (×9): 17 g via ORAL
  Filled 2019-08-19 (×8): qty 1

## 2019-08-19 MED ORDER — ASPIRIN EC 81 MG PO TBEC
81.0000 mg | DELAYED_RELEASE_TABLET | Freq: Every day | ORAL | Status: DC
  Administered 2019-08-19: 09:00:00 81 mg via ORAL
  Filled 2019-08-19: qty 1

## 2019-08-19 MED ORDER — TAMSULOSIN HCL 0.4 MG PO CAPS
0.4000 mg | ORAL_CAPSULE | Freq: Every day | ORAL | Status: DC
  Administered 2019-08-19 – 2019-08-23 (×5): 0.4 mg via ORAL
  Filled 2019-08-19 (×5): qty 1

## 2019-08-19 MED ORDER — POTASSIUM CHLORIDE CRYS ER 20 MEQ PO TBCR
40.0000 meq | EXTENDED_RELEASE_TABLET | Freq: Once | ORAL | Status: AC
  Administered 2019-08-19: 07:00:00 40 meq via ORAL
  Filled 2019-08-19: qty 2

## 2019-08-19 MED ORDER — FUROSEMIDE 10 MG/ML IJ SOLN
20.0000 mg | Freq: Every day | INTRAMUSCULAR | Status: DC
  Administered 2019-08-20 – 2019-08-23 (×4): 20 mg via INTRAVENOUS
  Filled 2019-08-19 (×4): qty 2

## 2019-08-19 MED ORDER — MAGNESIUM SULFATE 2 GM/50ML IV SOLN
2.0000 g | Freq: Once | INTRAVENOUS | Status: AC
  Administered 2019-08-19: 07:00:00 2 g via INTRAVENOUS
  Filled 2019-08-19: qty 50

## 2019-08-19 NOTE — Progress Notes (Addendum)
Patient Demographics:    Stanley Levy, is a 64 y.o. male, DOB - 08-25-1955, JXB:[id number]  Admit date - 08/15/2019   Admitting Physician Lilyan Gilford, DO  Outpatient Primary MD for the patient is Freddy Finner, NP  LOS - 4   Chief Complaint  Patient presents with  . Shortness of Breath        Subjective:    Stanley Levy today has no fevers, no emesis,  No chest pain,   Tired after not sleeping well overnight -Fatigue and dyspnea on minimal exertion persist -Some difficulty voiding   Assessment  & Plan :    Principal Problem:   Intracardiac thrombosis Active Problems:   Poorly controlled type 2 diabetes mellitus (HCC)   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 %   Hypothyroidism   Falls frequently   Essential hypertension   Cognitive changes/memory Challenges   LV (left ventricular) mural thrombus   Acute systolic heart failure (HCC)   Severe left ventricular systolic dysfunction   Hyperlipidemia LDL goal <70   Goals of care, counseling/discussion   Advanced care planning/counseling discussion   Palliative care by specialist   Echo 08/16/2019 1)Left ventricular ejection fraction, by estimation, is 20%. The left ventricle has severely decreased function. The left ventricle demonstrates global hypokinesis with regional variation.  2. Large LV Mural Thrombus in Distal anterolateral position,  approximately 2.0 x 2.5 cm (trabecular muscle obscures extent to some degree).   Brief Summary:- 64 y.o.male,w/hx of hypothyroidism, HLD, uncontrolled DMII, and anxiety Admitted on 08/15/2017 with dyspnea and found to have new onset cardiomyopathy/CHF with EF of 20% and large LV mural thrombus  A/p 1)HFrEF--- EF 20%--patient with acute exacerbation of combined systolic and diastolic dysfunction CHF -Blood pressure  has been soft making it difficult to treated CHF -Repeat chest x-ray on 08/19/2019 with pulmonary venous congestion/CHF and pleural effusion --Continue Lasix  20 mg IV daily with BP hold parameters -Okay to initiate iv digoxin 0.125 mg daily -Started on Aldactone 12.5 mg daily -Hold lisinopril and discontinue Coreg due to BP concerns -Patient remains volume overloaded -Weight is down to 213 pounds from 217 pounds -Use Ace wraps for lower extremities and keep lower extremities elevated -EF 20%, Intracardiac Thrombus and soft BP makes medication management rather challenging -Cardiology consult appreciated -We will consider transfer to [facility] for advanced heart failure team management including LHC/RHC after palliative care has had further opportunities to delineate goals of care with patient -Patient has previously not been compliant  2)Intracardiac Clot/Large LV thrombus--- Eliquis per pharmacy  3)H/o Prior CVA----  MRI Brain from 08/02/19--- with Chronic Lt Frontal Lobe encephalomalacia (remote infarct)-- -okay to stop aspirin C/n  Crestor and Eliquis as ordered -Patient with some mild memory/cognitive concerns  4)DM2- A1C is 18.5,  reflecting uncontrolled DM, Lantus insulin as ordered,  -Use Novolog/Humalog Sliding scale insulin with Accu-Cheks/Fingersticks as ordered   5)Hypothyroidism--- TSH is 15.9, increased levothyroxine to 37.5 mcg from 25 , will need repeat TSH in 3 to 4 weeks  6)Social/Ethics--- plan of care discussed with patient and wife, patient is DNR, 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 who has a history of seizures and  strokes and disability -Upon discharge patient hoping to go home and states the primary caregiver for his wife however he will need to go home with home health and palliative care services -Palliative care consult requested  7)Generalized weakness/deconditioning--- PT eval appreciated recommends home health  PT  8)BPH with LUTs--okay to try Flomax if urinary retention persists patient will need Foley to be inserted  Disposition/Need for in-Hospital Stay- patient unable to be discharged at this time due to --- significant volume overload requiring IV diuresis-- EF 20%, Intracardiac Thrombus and soft BP makes medication management rather challenging -Remains volume overloaded, not medically ready for discharge home with home health at this time  Code Status : DNR  Family Communication:    (patient is alert, awake and coherent) Discussed with wife  Consults  : Cardiology and palliative care  DVT Prophylaxis  : Eliquis- SCDs   Lab Results  Component Value Date   PLT 147 (L) 08/18/2019   Inpatient Medications  Scheduled Meds: . apixaban  10 mg Oral BID   Followed by  . [START ON 08/23/2019] apixaban  5 mg Oral BID  . aspirin EC  81 mg Oral Daily  . digoxin  0.125 mg Intravenous Daily  . [START ON 08/20/2019] furosemide  20 mg Intravenous Daily  . gabapentin  100 mg Oral TID  . imipramine  50 mg Oral QHS  . insulin aspart  0-15 Units Subcutaneous TID WC  . insulin aspart  0-5 Units Subcutaneous QHS  . insulin detemir  5 Units Subcutaneous QHS  . levothyroxine  37.5 mcg Oral QAC breakfast  . polyethylene glycol  17 g Oral BID  . rosuvastatin  5 mg Oral q1800  . sodium chloride flush  3 mL Intravenous Q12H  . spironolactone  12.5 mg Oral Daily  . tamsulosin  0.4 mg Oral QPC supper   Continuous Infusions: . sodium chloride 250 mL (08/19/19 0701)   PRN Meds:.sodium chloride, acetaminophen, ondansetron (ZOFRAN) IV, sodium chloride flush    Anti-infectives (From admission, onward)   None        Objective:   Vitals:   08/19/19 0523 08/19/19 0748 08/19/19 0902 08/19/19 1400  BP: 94/76  95/67 100/79  Pulse: 84 81 79 92  Resp:    18  Temp: 98 F (36.7 C)   97.8 F (36.6 C)  TempSrc: Oral   Oral  SpO2: (!) 72% 93%  100%  Weight:        Wt Readings from Last 3 Encounters:   08/18/19 96.8 kg  08/12/19 93 kg  08/11/19 93 kg     Intake/Output Summary (Last 24 hours) at 08/19/2019 1928 Last data filed at 08/19/2019 1800 Gross per 24 hour  Intake 1325.75 ml  Output 1350 ml  Net -24.25 ml     Physical Exam  Gen:- Awake Alert,  In no apparent distress  HEENT:- [place].AT, No sclera icterus Neck-Supple Neck,No JVD,.  Lungs-diminished air movement with bibasilar rales CV- S1, S2 normal, regular , 2/6 SM Abd-  +ve B.Sounds, Abd Soft, No tenderness,    Extremity/Skin:-2-3+ pitting edema, much less weepy legs, pedal pulses present  Psych-affect is appropriate, oriented x 3 (subtle cognitive and memory deficits) Neuro-generalized weakness, no new focal deficits, no tremors   Data Review:   Micro Results Recent Results (from the past 240 hour(s))  Respiratory Panel by RT PCR (Flu A&B, Covid) - Nasopharyngeal Swab     Status: None   Collection Time: 08/15/19  9:52 PM   Specimen:  Nasopharyngeal Swab  Result Value Ref Range Status   SARS Coronavirus 2 by RT PCR NEGATIVE NEGATIVE Final    Comment: (NOTE) SARS-CoV-2 target nucleic acids are NOT DETECTED. The SARS-CoV-2 RNA is generally detectable in upper respiratoy specimens during the acute phase of infection. The lowest concentration of SARS-CoV-2 viral copies this assay can detect is 131 copies/mL. A negative result does not preclude SARS-Cov-2 infection and should not be used as the sole basis for treatment or other patient management decisions. A negative result may occur with  improper specimen collection/handling, submission of specimen other than nasopharyngeal swab, presence of viral mutation(s) within the areas targeted by this assay, and inadequate number of viral copies (<131 copies/mL). A negative result must be combined with clinical observations, patient history, and epidemiological information. The expected result is Negative. Fact Sheet for Patients:   https://www.moore.com/ Fact Sheet for Healthcare Providers:  https://www.young.biz/ This test is not yet ap proved or cleared by the [place] FDA and  has been authorized for detection and/or diagnosis of SARS-CoV-2 by FDA under an Emergency Use Authorization (EUA). This EUA will remain  in effect (meaning this test can be used) for the duration of the COVID-19 declaration under Section 564(b)(1) of the Act, 21 U.S.C. section 360bbb-3(b)(1), unless the authorization is terminated or revoked sooner.    Influenza A by PCR NEGATIVE NEGATIVE Final   Influenza B by PCR NEGATIVE NEGATIVE Final    Comment: (NOTE) The Xpert Xpress SARS-CoV-2/FLU/RSV assay is intended as an aid in  the diagnosis of influenza from Nasopharyngeal swab specimens and  should not be used as a sole basis for treatment. Nasal washings and  aspirates are unacceptable for Xpert Xpress SARS-CoV-2/FLU/RSV  testing. Fact Sheet for Patients: https://www.moore.com/ Fact Sheet for Healthcare Providers: https://www.young.biz/ This test is not yet approved or cleared by the [place] FDA and  has been authorized for detection and/or diagnosis of SARS-CoV-2 by  FDA under an Emergency Use Authorization (EUA). This EUA will remain  in effect (meaning this test can be used) for the duration of the  Covid-19 declaration under Section 564(b)(1) of the Act, 21  U.S.C. section 360bbb-3(b)(1), unless the authorization is  terminated or revoked. Performed at [facility], [address], [place], [place] [address]     Radiology Reports CT HEAD WO CONTRAST  Result Date: 08/01/2019 CLINICAL DATA:  Multiple falls. EXAM: CT HEAD WITHOUT CONTRAST TECHNIQUE: Contiguous axial images were obtained from the base of the skull through the vertex without intravenous contrast. COMPARISON:  None. FINDINGS: Brain: There is moderate severity cerebral atrophy with  widening of the extra-axial spaces and ventricular dilatation. There are areas of decreased attenuation within the white matter tracts of the supratentorial brain, consistent with microvascular disease changes. Adjacent 1 mm and 2 mm foci of white matter calcification are seen along the posterior aspect of the centrum semiovale on the left. Vascular: No hyperdense vessel or unexpected calcification. Skull: Normal. Negative for fracture or focal lesion. Sinuses/Orbits: No acute finding. Other: None. IMPRESSION: 1. Generalized cerebral atrophy. 2. No acute intracranial abnormality. Electronically Signed   By: Aram Candela M.D.   On: 08/01/2019 20:08   CT Angio Chest PE W/Cm &/Or Wo Cm  Result Date: 08/15/2019 CLINICAL DATA:  Short of breath, elevated D dimer EXAM: CT ANGIOGRAPHY CHEST WITH CONTRAST TECHNIQUE: Multidetector CT imaging of the chest was performed using the standard protocol during bolus administration of intravenous contrast. Multiplanar CT image reconstructions and MIPs were obtained to evaluate the  vascular anatomy. CONTRAST:  OMNIPAQUE IOHEXOL 350 MG/ML SOLN COMPARISON:  08/15/2019 FINDINGS: Cardiovascular: This is a technically adequate evaluation of the pulmonary vasculature. There are no filling defects or pulmonary emboli. Heart is enlarged without pericardial effusion. Thoracic aorta is normal in caliber. Mediastinum/Nodes: Multiple subcentimeter lymph nodes within the mediastinum and hilar nonspecific. No pathologic adenopathy. Lungs/Pleura: There are bilateral pleural effusions volume estimated less than 1 L each. Significant compressive atelectasis within the right lower lobe. Central airways are patent. No pneumothorax. Upper Abdomen: No acute abnormality. Musculoskeletal: No acute or destructive bony lesions. Reconstructed images demonstrate no additional findings. Review of the MIP images confirms the above findings. IMPRESSION: 1. No evidence of pulmonary embolus. 2.  Bilateral pleural effusions with lower lobe compressive atelectasis. 3. Cardiomegaly. Electronically Signed   By: Sharlet Salina M.D.   On: 08/15/2019 21:55   MR BRAIN WO CONTRAST  Result Date: 08/02/2019 CLINICAL DATA:  64 year old male with multiple falls. Encephalopathy, fatigue. EXAM: MRI HEAD WITHOUT CONTRAST TECHNIQUE: Multiplanar, multiecho pulse sequences of the brain and surrounding structures were obtained without intravenous contrast. COMPARISON:  Head CT yesterday. FINDINGS: Brain: Confluent left superior frontal lobe white matter gliosis and encephalomalacia in continuity with the left lateral ventricle. No restricted diffusion to suggest acute infarction. No midline shift, mass effect, evidence of mass lesion, ventriculomegaly, extra-axial collection or acute intracranial hemorrhage. Cervicomedullary junction and pituitary are within normal limits. Additional Patchy and confluent bilateral cerebral white matter T2 and FLAIR hyperintensity. No cortical encephalomalacia or chronic cerebral blood products identified. The deep gray nuclei, brainstem and cerebellum are within normal limits. Vascular: Major intracranial vascular flow voids are preserved. Skull and upper cervical spine: Negative visible cervical spine. Visualized bone marrow signal is within normal limits. Sinuses/Orbits: Negative orbits. Trace paranasal sinus mucosal thickening. Other: Mastoids are clear. Visible internal auditory structures appear normal. Scalp and face soft tissues appear negative. IMPRESSION: 1. No acute intracranial abnormality. 2. 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 Additional moderate for age cerebral white matter signal changes, most commonly due to chronic small vessel disease. Electronically Signed   By: Odessa Fleming M.D.   On: 08/02/2019 07:44   DG Chest Port 1 View  Result Date: 08/19/2019 CLINICAL DATA:  Shortness of breath on exertion. EXAM: PORTABLE CHEST 1 VIEW  COMPARISON:  08/15/2019 FINDINGS: Stable enlarged cardiac silhouette and prominent hilar vessels. Clear lungs with stable mildly prominent bilateral upper lung zone vascularity. Small left pleural effusion, mildly increased. No right pleural fluid seen today. Thoracic spine degenerative changes. IMPRESSION: 1. Small left pleural effusion, mildly increased. 2. Stable cardiomegaly and mild pulmonary vascular congestion. Electronically Signed   By: Beckie Salts M.D.   On: 08/19/2019 08:35   DG Chest Portable 1 View  Result Date: 08/15/2019 CLINICAL DATA:  Dyspnea. EXAM: PORTABLE CHEST 1 VIEW COMPARISON:  08/01/2019 FINDINGS: The heart size remains enlarged. There is no pneumothorax. There may be a trace right-sided pleural effusion. No acute osseous abnormality. IMPRESSION: No active disease. Electronically Signed   By: Katherine Mantle M.D.   On: 08/15/2019 20:12   DG Chest Portable 1 View  Result Date: 08/01/2019 CLINICAL DATA:  Change in mental status EXAM: PORTABLE CHEST 1 VIEW COMPARISON:  None. FINDINGS: The heart size and mediastinal contours are within normal limits. Both lungs are clear. The visualized skeletal structures are unremarkable. IMPRESSION: No active disease. Electronically Signed   By: Jonna Clark M.D.   On: 08/01/2019 17:46   ECHOCARDIOGRAM COMPLETE  Result  Date: 08/16/2019    ECHOCARDIOGRAM REPORT   Patient Name:   Stanley Levy Date of Exam: 08/16/2019 Medical Rec #:  [id number]      Height:       69.0 in Accession #:    [id number]     Weight:       209.7 lb Date of Birth:  Apr 11, 1956       BSA:          2.11 m Patient Age:    63 years       BP:           108/73 mmHg Patient Gender: M              HR:           90 bpm. [facility] Procedure: 2D Echo Indications:    Congestive Heart Failure 428.0 / I50.9  History:        Patient has no prior history of Echocardiogram examinations.                 CHF; Risk Factors:Non-Smoker, Dyslipidemia and Diabetes.                  Hyperglycemia due to diabetes mellitus.  Sonographer:    Jeryl Columbia RDCS (AE) Referring Phys: [id number] ASIA B ZIERLE-GHOSH IMPRESSIONS  1. Left ventricular ejection fraction, by estimation, is 20%. The left ventricle has severely decreased function. The left ventricle demonstrates global hypokinesis with regional variation.  2. Large LV mural thrombus in distal anterolateral position, approximately 2.0 x 2.5 cm (trabecular muscle obscures extent to some degree). Reported to Dr. Mariea Clonts.  3. Right ventricular systolic function is moderately reduced. The right ventricular size is normal. There is mildly elevated pulmonary artery systolic pressure. The estimated right ventricular systolic pressure is 32.4 mmHg.  4. Left atrial size was mildly dilated.  5. Right atrial size was moderately dilated.  6. The mitral valve is grossly normal. Trivial mitral valve regurgitation.  7. The aortic valve is tricuspid. Aortic valve regurgitation is not visualized.  8. The inferior vena cava is normal in size with greater than 50% respiratory variability, suggesting right atrial pressure of 3 mmHg. FINDINGS  Left Ventricle: Left ventricular ejection fraction, by estimation, is 20%. The left ventricle has severely decreased function. The left ventricle demonstrates global hypokinesis. The left ventricular internal cavity size was normal in size. There is no left ventricular hypertrophy. Left ventricular diastolic parameters are consistent with Grade II diastolic dysfunction (pseudonormalization). Right Ventricle: The right ventricular size is normal. No increase in right ventricular wall thickness. Right ventricular systolic function is moderately reduced. There is mildly elevated pulmonary artery systolic pressure. The tricuspid regurgitant velocity is 2.71 m/s, and with an assumed right atrial pressure of 3 mmHg, the estimated right ventricular systolic pressure is 32.4 mmHg. Left Atrium: Left atrial size was mildly dilated.  Right Atrium: Right atrial size was moderately dilated. Pericardium: Trivial pericardial effusion is present. The pericardial effusion is posterior to the left ventricle. Mitral Valve: The mitral valve is grossly normal. Trivial mitral valve regurgitation. Tricuspid Valve: The tricuspid valve is grossly normal. Tricuspid valve regurgitation is mild. Aortic Valve: The aortic valve is tricuspid. Aortic valve regurgitation is not visualized. Pulmonic Valve: The pulmonic valve was grossly normal. Pulmonic valve regurgitation is not visualized. Aorta: The aortic root is normal in size and structure. Venous: The inferior vena cava is normal in size with greater than 50% respiratory variability, suggesting right atrial  pressure of 3 mmHg. IAS/Shunts: No atrial level shunt detected by color flow Doppler.  LEFT VENTRICLE PLAX 2D LVIDd:         5.32 cm  Diastology LVIDs:         5.07 cm  LV e' lateral:   8.05 cm/s LV PW:         1.09 cm  LV E/e' lateral: 10.8 LV IVS:        0.86 cm  LV e' medial:    5.98 cm/s LVOT diam:     2.00 cm  LV E/e' medial:  14.6 LV SV Index:   6.61 LVOT Area:     3.14 cm  RIGHT VENTRICLE RV S prime:     9.36 cm/s TAPSE (M-mode): 1.8 cm LEFT ATRIUM             Index       RIGHT ATRIUM           Index LA diam:        3.70 cm 1.76 cm/m  RA Area:     28.20 cm LA Vol (A2C):   83.1 ml 39.42 ml/m RA Volume:   106.00 ml 50.29 ml/m LA Vol (A4C):   56.9 ml 26.99 ml/m LA Biplane Vol: 71.2 ml 33.78 ml/m   AORTA Ao Root diam: 3.40 cm MITRAL VALVE               TRICUSPID VALVE MV Area (PHT): 4.10 cm    TR Peak grad:   29.4 mmHg MV Decel Time: 185 msec    TR Vmax:        271.00 cm/s MV E velocity: 87.30 cm/s MV A velocity: 65.00 cm/s  SHUNTS MV E/A ratio:  1.34        Systemic Diam: 2.00 cm Nona Dell MD Electronically signed by Nona Dell MD Signature Date/Time: 08/16/2019/11:45:40 AM    Final      CBC Recent Labs  Lab 08/15/19 1955 08/16/19 0557 08/18/19 0526  WBC 9.4 8.7 6.9  HGB 13.8  13.7 13.7  HCT 42.7 42.9 42.0  PLT 157 129* 147*  MCV 99.3 99.1 97.2  MCH 32.1 31.6 31.7  MCHC 32.3 31.9 32.6  RDW 15.8* 15.9* 15.2  LYMPHSABS 1.5 2.0  --   MONOABS 0.8 0.8  --   EOSABS 0.0 0.0  --   BASOSABS 0.0 0.0  --     Chemistries  Recent Labs  Lab 08/15/19 1955 08/16/19 0557 08/17/19 0548 08/18/19 0526 08/19/19 0438  NA 131* 136 138 133* 133*  K 5.0 3.6 4.0 3.6 3.5  CL 100 101 101 98 98  CO2 22 23 27  21* 24  GLUCOSE 415* 238* 55* 155* 85  BUN 18 16 21  29* 32*  CREATININE 1.11 0.85 0.94 1.10 1.11  CALCIUM 8.7* 8.8* 8.5* 8.3* 8.3*  MG  --  1.7  --   --  1.9  AST 45* 46*  --   --   --   ALT 69* 65*  --   --   --   ALKPHOS 422* 432*  --   --   --   BILITOT 1.9* 1.6*  --   --   --    ----------------------------------------------------------------------------------------------------------------- No results for input(s): CHOL, HDL, LDLCALC, TRIG, CHOLHDL, LDLDIRECT in the last 72 hours.  Lab Results  Component Value Date   HGBA1C >18.5 (H) 08/01/2019   ----------------------------------------------------------------------------------------------------------------- No results for input(s): TSH, T4TOTAL, T3FREE, THYROIDAB in the last 72 hours.  Invalid  input(s): FREET3 ------------------------------------------------------------------------------------------------------------------ No results for input(s): VITAMINB12, FOLATE, FERRITIN, TIBC, IRON, RETICCTPCT in the last 72 hours.  Coagulation profile No results for input(s): INR, PROTIME in the last 168 hours.  No results for input(s): DDIMER in the last 72 hours.  Cardiac Enzymes No results for input(s): CKMB, TROPONINI, MYOGLOBIN in the last 168 hours.  Invalid input(s): CK ------------------------------------------------------------------------------------------------------------------    Component Value Date/Time   BNP 1,165.0 (H) 08/15/2019 1955    Shon Hale M.D on 08/19/2019 at 7:28 PM  Go  to [email or web address] - for contact info  [facility] - Office  [phone]

## 2019-08-19 NOTE — Progress Notes (Addendum)
Progress Note  Patient Name: Stanley Levy Date of Encounter: 08/19/2019  Primary Cardiologist: Prentice Docker, MD   Subjective   He denies any chest pain or palpitations. He feels like his breathing has improved but sounds short of breath while talking. Says he did not sleep well overnight and is tired this AM.   Inpatient Medications    Scheduled Meds: . apixaban  10 mg Oral BID   Followed by  . [START ON 08/23/2019] apixaban  5 mg Oral BID  . aspirin EC  81 mg Oral Daily  . furosemide  20 mg Intravenous BID  . gabapentin  100 mg Oral TID  . imipramine  50 mg Oral QHS  . insulin aspart  0-15 Units Subcutaneous TID WC  . insulin aspart  0-5 Units Subcutaneous QHS  . insulin detemir  5 Units Subcutaneous QHS  . levothyroxine  37.5 mcg Oral QAC breakfast  . rosuvastatin  5 mg Oral q1800  . sodium chloride flush  3 mL Intravenous Q12H   Continuous Infusions: . sodium chloride 250 mL (08/19/19 0701)  . magnesium sulfate bolus IVPB 2 g (08/19/19 0704)   PRN Meds: sodium chloride, acetaminophen, ondansetron (ZOFRAN) IV, sodium chloride flush   Vital Signs    Vitals:   08/18/19 1956 08/18/19 2016 08/19/19 0523 08/19/19 0748  BP:  92/71 94/76   Pulse:  85 84 81  Resp:  18    Temp:  98.4 F (36.9 C) 98 F (36.7 C)   TempSrc:  Oral Oral   SpO2: 100% 98% (!) 72% 93%  Weight:        Intake/Output Summary (Last 24 hours) at 08/19/2019 0804 Last data filed at 08/19/2019 0700 Gross per 24 hour  Intake 240 ml  Output 1000 ml  Net -760 ml    Last 3 Weights 08/18/2019 08/17/2019 08/16/2019  Weight (lbs) 213 lb 6.5 oz 217 lb 13 oz 209 lb 10.5 oz  Weight (kg) 96.8 kg 98.8 kg 95.1 kg  Some encounter information is confidential and restricted. Go to Review Flowsheets activity to see all data.      Telemetry    NSR, HR in 70's to 80's. Occasional PVC's. 6.15 second run of SVT overnight.  - Personally Reviewed  ECG    No new tracings.   Physical Exam   General:  Well developed, well nourished, male appearing in no acute distress. Head: Normocephalic, atraumatic.  Neck: Supple without bruits, JVD at 8 cm. Lungs:  Resp regular and unlabored, mild rales along bases Heart: RRR, S1, S2, no S3, S4, or murmur; no rub. Abdomen: Soft, non-tender, non-distended with normoactive bowel sounds. No hepatomegaly. No rebound/guarding. No obvious abdominal masses. Extremities: No clubbing or cyanosis, 2+ pitting edema bilaterally with wraps in place. Distal pedal pulses are 2+ bilaterally. Neuro: Alert and oriented X 3. Moves all extremities spontaneously. Psych: Normal affect.  Labs    Chemistry Recent Labs  Lab 08/15/19 1955 08/15/19 1955 08/16/19 0557 08/16/19 0557 08/17/19 0548 08/18/19 0526 08/19/19 0438  NA 131*   < > 136   < > 138 133* 133*  K 5.0   < > 3.6   < > 4.0 3.6 3.5  CL 100   < > 101   < > 101 98 98  CO2 22   < > 23   < > 27 21* 24  GLUCOSE 415*   < > 238*   < > 55* 155* 85  BUN 18   < >  16   < > 21 29* 32*  CREATININE 1.11   < > 0.85   < > 0.94 1.10 1.11  CALCIUM 8.7*   < > 8.8*   < > 8.5* 8.3* 8.3*  PROT 6.1*  --  5.8*  --   --   --   --   ALBUMIN 3.1*  --  3.0*  --   --   --   --   AST 45*  --  46*  --   --   --   --   ALT 69*  --  65*  --   --   --   --   ALKPHOS 422*  --  432*  --   --   --   --   BILITOT 1.9*  --  1.6*  --   --   --   --   GFRNONAA >60   < > >60   < > >60 >60 >60  GFRAA >60   < > >60   < > >60 >60 >60  ANIONGAP 9   < > 12   < > 10 14 11    < > = values in this interval not displayed.     Hematology Recent Labs  Lab 08/15/19 1955 08/16/19 0557 08/18/19 0526  WBC 9.4 8.7 6.9  RBC 4.30 4.33 4.32  HGB 13.8 13.7 13.7  HCT 42.7 42.9 42.0  MCV 99.3 99.1 97.2  MCH 32.1 31.6 31.7  MCHC 32.3 31.9 32.6  RDW 15.8* 15.9* 15.2  PLT 157 129* 147*    Cardiac EnzymesNo results for input(s): TROPONINI in the last 168 hours. No results for input(s): TROPIPOC in the last 168 hours.   BNP Recent Labs  Lab  08/15/19 1955  BNP 1,165.0*     DDimer  Recent Labs  Lab 08/15/19 1955  DDIMER 1.60*     Radiology    No results found.  Cardiac Studies   Echocardiogram: 08/16/2019 IMPRESSIONS    1. Left ventricular ejection fraction, by estimation, is 20%. The left  ventricle has severely decreased function. The left ventricle demonstrates  global hypokinesis with regional variation.  2. Large LV mural thrombus in distal anterolateral position,  approximately 2.0 x 2.5 cm (trabecular muscle obscures extent to some  degree). Reported to Dr. 08/18/2019.  3. Right ventricular systolic function is moderately reduced. The right  ventricular size is normal. There is mildly elevated pulmonary artery  systolic pressure. The estimated right ventricular systolic pressure is  32.4 mmHg.  4. Left atrial size was mildly dilated.  5. Right atrial size was moderately dilated.  6. The mitral valve is grossly normal. Trivial mitral valve  regurgitation.  7. The aortic valve is tricuspid. Aortic valve regurgitation is not  visualized.  8. The inferior vena cava is normal in size with greater than 50%  respiratory variability, suggesting right atrial pressure of 3 mmHg.   Patient Profile     64 y.o. male w/ PMH of uncontrolled Type 2 DM, HTN, HLD, history of CVA and medication noncompliance, currently admitted with a CHF exacerbation and found to have a newly diagnosed cardiomyopathy with EF at 20% and to have an LV thrombus.   Assessment & Plan    1. Acute on Chronic Systolic CHF/New Cardiomyopathy - BNP elevated to 1165 on admission and CXR showed bilateral pleural effusions and cardiomegaly. Echo shows a reduced EF of 20% with global HK.  - was on IV Lasix 40mg  BID and weight had  declined by 4 lbs and dosing was reduced to 20mg  BID yesterday given hypotension. He reports improvement in his dyspnea but is audibly short of breath while talking. Will order repeat CXR for this AM. Creatinine was  1.11 on admission and at an identical valve today. Was on Coreg 3.125mg  BID and Lisinopril 10mg  daily but these were discontinued yesterday. With SBP in the 90's, there is minimal room to add BB, Spironolactone, or ACE/ARB/Entresto at this time.  - Palliative Care has been consulted for goals of care discussions today. Given his history of medication noncompliance and multiple medical issues, his prognosis is poor. If he wishes to pursue aggressive measures, then this would involve having R/LHC and being evaluated by the Advanced Heart Failure Team but I am concerned he would not be a candidate for advanced therapies unless he demonstrates compliance.    2. LV Thrombus - Echocardiogram on 08/16/2019 showed a large LV mural thrombus, approximately 2.0 x 2.5 cm. Eliquis has been initiated over Coumadin given concern for medications compliance in the outpatient setting. He was on ASA 81mg  daily given history of CVA. Will defer continuation of this to Neurology.   3. HTN - SBP dropped into the 80's yesterday and both Lisinopril and Coreg were discontinued. SBP has since been stable in the 90's. Continue to hold both ACE-I and BB for now.   4. HLD - FLP in 08/2019 showed total cholesterol 168, HDL 56, triglycerides 185 and LDL 84. Remains on Crestor 5 mg daily.  5. Hypothyroidism - TSH elevated to 15.913 this admission.  He was recently restarted on Synthroid. Further management per admitting team. This could be contributing to his brief runs of SVT as well.   6. Uncontrolled IDDM - Hgb A1c > 18.5 when checked earlier this month. Further management per admitting team. Would consider initiation of Farxiga or Jardiance as an outpatient given his HFrEF.    For questions or updates, please contact [facility] Please consult [email or web address] for contact info under Cardiology/STEMI.   08/18/2019 , PA-C 8:04 AM 08/19/2019 Pager: [phone]   Attending note:  Patient seen and  examined.  I reviewed the chart and discussed the case with Ms. Lorri Frederick, I agree with her documentation.  Patient intermittently tachypneic with talking, denies any chest pain or sense of palpitations.  He remains mildly hypotensive with systolics in the 90s, heart rate in the 70s to 80s in sinus rhythm by telemetry which I personally reviewed.  Diuresis of approximately 800 cc out more than last 24 hours.  Breath sounds are decreased at the bases with a few crackles.  Cardiac exam with indistinct PMI and no S3 gallop.  Peripheral edema remains.  Lab work shows potassium 3.5, BUN 32, creatinine 1.11.  Follow-up chest x-ray this morning shows mild vascular congestion and a small left pleural effusion.  Patient has a newly documented cardiomyopathy with LVEF approximately 20% and diffuse hypokinesis.  He also has a large LV mural thrombus.  Patient insight into medical condition and implications is limited and I see that a palliative care consult has also been placed to discuss goals of care and assist with direction of his management.  Current blood pressure is limiting up titration of standard heart failure therapy.  Would not initiate beta-blocker at this time.  Attempting to add low-dose Aldactone, also digoxin.  Continue low-dose IV Lasix.  Also on Eliquis for management of LV mural thrombus (Coumadin was avoided given documented noncompliance previously).  He was  also started on Synthroid recently with TSH of 15 and hypothyroidism.  He has substantially uncontrolled type 2 diabetes mellitus with recent hemoglobin A1c greater than 18.  Conservative medical therapy might make the most sense, particularly if this is in line with patient expectations.  If a more aggressive/invasive work-up is ultimately the decision, patient could be transferred to [facility] on the hospitalist service with involvement from the advanced heart failure team (right and left heart catheterization plus minus inotropes as  indicated).  Satira Sark, M.D., F.A.C.C.

## 2019-08-19 NOTE — Plan of Care (Signed)

## 2019-08-19 NOTE — Progress Notes (Signed)
This RN spoke with patient. He gives permission to add Mable Moses to patient contact information. He reports knowing this person and is comfortable with her getting health information.

## 2019-08-19 NOTE — Care Management Important Message (Signed)
Important Message  Patient Details  Name: Stanley Levy MRN: [id number] Date of Birth: 1955-07-14   Medicare Important Message Given:  Yes     Corey Harold 08/19/2019, 2:58 PM

## 2019-08-19 NOTE — Progress Notes (Signed)
Bladder scan at 1800 showed 813. I/O cath attempted, met resistance and patient did not tolerate well. No urine emptied through cathing. Patient was able to void small amount after. Dr. Joesph Fillers made aware.

## 2019-08-19 NOTE — Care Management Important Message (Signed)
Important Message  Patient Details  Name: Stanley Levy MRN: [id number] Date of Birth: February 23, 1956   Medicare Important Message Given:  Yes     Corey Harold 08/19/2019, 2:49 PM

## 2019-08-19 NOTE — Progress Notes (Signed)
Spoke with patient's aunt, Thurmon Fair. She was concerned that she was not receiving information about patient and that patient was confused. She requested that she be added to his contact information. I took her information and told her that I would ask his permission. I also informed her that patient is allowed 1 visitor from 1000-2000 each day. She said that she would be bringing the wife here today to visit.

## 2019-08-19 NOTE — Progress Notes (Signed)
Physical Therapy Treatment Patient Details Name: Stanley Levy MRN: [id number] DOB: 06-08-1956 Today's Date: 08/19/2019    History of Present Illness Stanley Levy  is a 64 y.o. male, w/hx of thyroid disease, HLD, DMII, and anxiety presents with chief complaint of peripheral edema. Patient is a poor historian, and seems to have little insight into his medical conditions and medications at home. Patient reports that the onset of his symptoms was 24 hours ago. Patient reports that his legs were so swollen that he couldn't walk. He has associated dyspnea. He reports that standing up and exerting himself make both symptoms worse. He denies orthopnea. He denies chest pain, palpitations, and wheezing. Patient reports that he has not had anything like this before.    PT Comments    Patient demonstrates increased BLE strength for completing sit to stands, had less unsteadiness during gait training and limited mostly due to c/o fatigue.  Patient tolerated sitting up in chair after therapy.  Patient will benefit from continued physical therapy in hospital and recommended venue below to increase strength, balance, endurance for safe ADLs and gait.    Follow Up Recommendations  Home health PT;Supervision - Intermittent     Equipment Recommendations  None recommended by PT    Recommendations for Other Services       Precautions / Restrictions Precautions Precautions: Fall Restrictions Weight Bearing Restrictions: No    Mobility  Bed Mobility Overal bed mobility: Modified Independent             General bed mobility comments: slightly increased time  Transfers Overall transfer level: Needs assistance Equipment used: Straight cane Transfers: Sit to/from Stand;Stand Pivot Transfers Sit to Stand: Supervision Stand pivot transfers: Supervision       General transfer comment: demonstrates increased BLE strength for completing sit to stands  Ambulation/Gait Ambulation/Gait assistance:  Supervision Gait Distance (Feet): 100 Feet Assistive device: Straight cane Gait Pattern/deviations: WFL(Within Functional Limits);Step-through pattern;Decreased step length - left;Decreased stance time - right Gait velocity: decreased   General Gait Details: slightly labored cadence without loss of balance, limited secondary to c/o fatigue   Stairs             Wheelchair Mobility    Modified Rankin (Stroke Patients Only)       Balance Overall balance assessment: Needs assistance Sitting-balance support: Feet supported;No upper extremity supported Sitting balance-Leahy Scale: Good Sitting balance - Comments: seated EOB     Standing balance-Leahy Scale: Fair Standing balance comment: fair/good using SPC                            Cognition Arousal/Alertness: Awake/alert Behavior During Therapy: WFL for tasks assessed/performed Overall Cognitive Status: Within Functional Limits for tasks assessed                                        Exercises General Exercises - Lower Extremity Long Arc Quad: Seated;AROM;Strengthening;Both;10 reps Hip Flexion/Marching: Seated;AROM;Strengthening;Both;10 reps Toe Raises: Seated;AROM;Strengthening;Both;10 reps Heel Raises: Seated;AROM;Strengthening;Both;10 reps    General Comments        Pertinent Vitals/Pain Pain Assessment: No/denies pain    Home Living                      Prior Function            PT Goals (current goals can now be  found in the care plan section) Acute Rehab PT Goals Patient Stated Goal: return home with family to assist PT Goal Formulation: With patient Time For Goal Achievement: 08/19/19 Potential to Achieve Goals: Good Progress towards PT goals: Progressing toward goals    Frequency    Min 3X/week      PT Plan Current plan remains appropriate    Co-evaluation              AM-PAC PT "6 Clicks" Mobility   Outcome Measure  Help needed  turning from your back to your side while in a flat bed without using bedrails?: None Help needed moving from lying on your back to sitting on the side of a flat bed without using bedrails?: None Help needed moving to and from a bed to a chair (including a wheelchair)?: A Little Help needed standing up from a chair using your arms (e.g., wheelchair or bedside chair)?: A Little Help needed to walk in hospital room?: A Little Help needed climbing 3-5 steps with a railing? : A Little 6 Click Score: 20    End of Session Equipment Utilized During Treatment: Gait belt Activity Tolerance: Patient tolerated treatment well;Patient limited by fatigue Patient left: in chair;with call bell/phone within reach Nurse Communication: Mobility status PT Visit Diagnosis: Unsteadiness on feet (R26.81);Other abnormalities of gait and mobility (R26.89);Muscle weakness (generalized) (M62.81)     Time: 3154-0086 PT Time Calculation (min) (ACUTE ONLY): 21 min  Charges:  $Gait Training: 8-22 mins $Therapeutic Exercise: 8-22 mins                     12:30 PM, 08/19/19 Lonell Grandchild, MPT Physical Therapist with [facility] 336 [phone] office [phone] mobile phone

## 2019-08-19 NOTE — TOC Initial Note (Signed)
Transition of Care [facility]) - Initial/Assessment Note    Patient Details  Name: Stanley Levy MRN: [id number] Date of Birth: 09/26/1955  Transition of [facility]) CM/SW Contact:    Leitha Bleak, RN Phone Number: 08/19/2019, 4:12 PM  Clinical Narrative:       Patient admitted with Intracardiac thrombosis. Palliative consult ordered.  Patient is active with [facility] for HHPT.  Updated Clydie Braun and they will follow. Patient has also elected to used Palliative care in the home.  Called [name] with [facility]. She accepted the referral. TOC to follow.            Expected Discharge Plan: Home w Home Health Services Barriers to Discharge: Continued Medical Work up   Patient Goals and CMS Choice   CMS Medicare.gov Compare Post Acute Care list provided to:: Patient Choice offered to / list presented to : Patient  Expected Discharge Plan and Services Expected Discharge Plan: Home w Home Health Services   Prior Living Arrangements/Services         Need for Family Participation in Patient Care: Yes (Comment) Care giver support system in place?: Yes (comment)   Criminal Activity/Legal Involvement Pertinent to Current Situation/Hospitalization: No - Comment as needed  Activities of Daily Living Home Assistive Devices/Equipment: None ADL Screening (condition at time of admission) Patient's cognitive ability adequate to safely complete daily activities?: Yes Is the patient deaf or have difficulty hearing?: No Does the patient have difficulty seeing, even when wearing glasses/contacts?: No Does the patient have difficulty concentrating, remembering, or making decisions?: No Patient able to express need for assistance with ADLs?: Yes Does the patient have difficulty dressing or bathing?: No Independently performs ADLs?: Yes (appropriate for developmental age) Does the patient have difficulty walking or climbing stairs?: No Weakness of Legs: Both Weakness of Arms/Hands: None  Permission  Sought/Granted     Emotional Assessment     Affect (typically observed): Accepting Orientation: : Oriented to Self, Oriented to Place, Oriented to  Time, Oriented to Situation   Psych Involvement: No (comment)  Admission diagnosis:  CHF (congestive heart failure) (HCC) [I50.9] Acute congestive heart failure, unspecified heart failure type (HCC) [I50.9] Systolic congestive heart failure, unspecified HF chronicity (HCC) [I50.20] Patient Active Problem List   Diagnosis Date Noted  . Goals of care, counseling/discussion   . Advanced care planning/counseling discussion   . Palliative care by specialist   . Hypothyroidism 08/18/2019  . Cognitive changes/memory Challenges 08/18/2019  . LV (left ventricular) mural thrombus   . Acute systolic heart failure (HCC)   . Severe left ventricular systolic dysfunction   . Hyperlipidemia LDL goal <70   . Memory loss 08/16/2019  . Falls frequently 08/16/2019  . Fatigue 08/16/2019  . Essential hypertension 08/16/2019  . Elevated TSH 08/16/2019  . Encounter for hepatitis C screening test for low risk patient 08/16/2019  . Encounter for screening for malignant neoplasm of colon 08/16/2019  . Poorly controlled type 2 diabetes mellitus (HCC) 08/16/2019  . 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 % 08/16/2019  . CHF (congestive heart failure) (HCC) 08/15/2019  . Encounter for medication management 08/12/2019  . Hyperglycemia due to diabetes mellitus (HCC) 08/01/2019  . Weakness 08/01/2019  . Subacute confusional state 08/01/2019  . Anxiety disorder 11/25/2011   PCP:  Freddy Finner, NP Pharmacy:   [facility] [phone] - [place], [place] - [address] AT [facility] [address] [place] [address]  Phone: [phone] Fax: [phone]   Readmission Risk Interventions Readmission Risk Prevention Plan 08/02/2019  Post Dischage  Appt Complete  Medication Screening Complete  Transportation Screening Complete  Some recent data might be hidden

## 2019-08-19 NOTE — Consult Note (Addendum)
Consultation Note Date: 08/19/2019   Patient Name: Stanley Levy  DOB: [birth year]  MRN: [id number]  Age / Sex: 63 y.o., male  PCP: Perlie Mayo, NP Referring Physician: Roxan Hockey, MD  Reason for Consultation: Establishing goals of care  HPI/Patient Profile: 64 y.o. male  with past medical history of DM2, hypothyroid, HLD, anxiety admitted on 08/15/2019 with shortness of breath and edema of the legs. Workup has revealed poorly controlled DM with A1C >18, and new diagnosis of CHF and LV thrombus. ECHO indicates 20%EF with diffuse hypokinesis. Palliative medicine consulted for goals of care.    Clinical Assessment and Goals of Care: Met with patient and his wife.  They have very loving relationship- patient helps care for his wife who has history of strokes. Ronalee Belts tells me he enjoys lifting weights.  He notes about six months ago he became unable to lift weights and has been becoming weaker and weaker since. He is independent for ADL's.  Today he says he feels great "from the waist up". He notes his very swollen legs.  When asked about his understanding of his health condition- he tells me he just knows he has trouble walking.  A great amount of time was spent explaining to him and his spouse what heart failure is, and its possible trajectories with the possibility that it is a chronic, progressive illness that is eventually life limiting. Patient and spouse were both tearful with patient stating, "I just never thought anything like this could happen to me." We discussed options for continued aggressive care vs conservative care and the fact that with aggressive care (transferring to [facility], undergoing heart cath, consulting heart failure team)- he would have to be able to be very motivated and take his medicines at home once discharged, and follow a strict diet that he would be given. We talked about reasons  that he hasn't been able to do this in the past. He says that he cannot take his medicines on time because he might sleep in, or have a late dinner. I reviewed his medication list and explained that he just needs to take them at the same time every day, and he needs to keep all of his doctors appointments- he states he believes he can do this. At close of discussion he states he would like to proceed with conservative care and have time to be at home, however, he would like to pursue more aggressive care eventually when he is able to prove he can be compliant. His main goal is to have "one more year".  Code status was discussed. He and his wife agree that if his heart stopped, and he stopped breathing- he would not want CPR. His wife and he both state he will be in a better place if that happens.  He agreed to followup at home from outpatient Palliative care provider.   Primary Decision Maker PATIENT    SUMMARY OF RECOMMENDATIONS -Continue current care with hope of pursuing heart cath and advanced heart failure consultation at a  later time -DNR -TOC consult for home Palliative followup for symptom management and continued goals of care    Code Status/Advance Care Planning:  DNR  Additional Recommendations (Limitations, Scope, Preferences):  Full Scope Treatment  Prognosis:    Unable to determine  Discharge Planning: Home with Palliative Services  Primary Diagnoses: Present on Admission: . Intracardiac thrombosis . 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 Essential hypertension . Hypothyroidism . Cognitive changes/memory Challenges   I have reviewed the medical record, interviewed the patient and family, and examined the patient. The following aspects are pertinent.  Past Medical History:  Diagnosis Date  . Anxiety   . Diabetes mellitus, type II ([place])   . Hyperlipidemia   . Thyroid disease    Social  History   Socioeconomic History  . Marital status: Married    Spouse name: Kaveh Kissinger   . Number of children: Not on file  . Years of education: Not on file  . Highest education level: High school graduate  Occupational History  . Not on file  Tobacco Use  . Smoking status: Never Smoker  . Smokeless tobacco: Never Used  Substance and Sexual Activity  . Alcohol use: No  . Drug use: No  . Sexual activity: Yes  Other Topics Concern  . Not on file  Social History Narrative   Recent left working at [employer] after getting sick in start of February      Lives with wife [name]- married 44 (he is her caregiver, she has had strokes)    No children      Beagle: Lorre Nick       Enjoys: weight lift, yard work, Oceanographer, read-       Diet: increasing veggies, was drinking chocolate milk, root beer, and sweet tea, fast food   Caffeine: sweet tea-3 cups of more daily    Water: 8 cups daily       Wears seat belt    Smoke detectors    Does not use phone while driving    Social Determinants of Health   Financial Resource Strain: Low Risk   . Difficulty of Paying Living Expenses: Not hard at all  Food Insecurity: No Food Insecurity  . Worried About Charity fundraiser in the Last Year: Never true  . Ran Out of Food in the Last Year: Never true  Transportation Needs: No Transportation Needs  . Lack of Transportation (Medical): No  . Lack of Transportation (Non-Medical): No  Physical Activity: Inactive  . Days of Exercise per Week: 0 days  . Minutes of Exercise per Session: 0 min  Stress: No Stress Concern Present  . Feeling of Stress : Not at all  Social Connections: Slightly Isolated  . Frequency of Communication with Friends and Family: Twice a week  . Frequency of Social Gatherings with Friends and Family: Twice a week  . Attends Religious Services: 1 to 4 times per year  . Active Member of Clubs or Organizations: No  . Attends Archivist Meetings: Never  . Marital Status:  Married   Family History  Problem Relation Age of Onset  . Heart attack Father        Near Age 71  . Anxiety disorder Neg Hx   . Dementia Neg Hx   . Alcohol abuse Neg Hx   . Drug abuse Neg Hx   . ADD / ADHD Neg Hx   . Bipolar disorder Neg  Hx   . Depression Neg Hx   . OCD Neg Hx   . Paranoid behavior Neg Hx   . Physical abuse Neg Hx   . Schizophrenia Neg Hx   . Seizures Neg Hx   . Sexual abuse Neg Hx    Scheduled Meds: . apixaban  10 mg Oral BID   Followed by  . [START ON 08/23/2019] apixaban  5 mg Oral BID  . aspirin EC  81 mg Oral Daily  . digoxin  0.125 mg Intravenous Daily  . [START ON 08/20/2019] furosemide  20 mg Intravenous Daily  . gabapentin  100 mg Oral TID  . imipramine  50 mg Oral QHS  . insulin aspart  0-15 Units Subcutaneous TID WC  . insulin aspart  0-5 Units Subcutaneous QHS  . insulin detemir  5 Units Subcutaneous QHS  . levothyroxine  37.5 mcg Oral QAC breakfast  . polyethylene glycol  17 g Oral BID  . rosuvastatin  5 mg Oral q1800  . sodium chloride flush  3 mL Intravenous Q12H  . spironolactone  12.5 mg Oral Daily   Continuous Infusions: . sodium chloride 250 mL (08/19/19 0701)   PRN Meds:.sodium chloride, acetaminophen, ondansetron (ZOFRAN) IV, sodium chloride flush Medications Prior to Admission:  Prior to Admission medications   Medication Sig Start Date End Date Taking? Authorizing Provider  gabapentin (NEURONTIN) 100 MG capsule Take 1 capsule (100 mg total) by mouth 3 (three) times daily. 08/02/19 08/01/20 Yes Shah, Pratik D, DO  imipramine (TOFRANIL) 50 MG tablet Take 1 tablet (50 mg total) by mouth at bedtime. 12/01/18  Yes Cloria Spring, MD  insulin aspart protamine - aspart (NOVOLOG MIX 70/30 FLEXPEN) (70-30) 100 UNIT/ML FlexPen Inject 0.25 mLs (25 Units total) into the skin 2 (two) times daily with a meal. 08/12/19  Yes Perlie Mayo, NP  levothyroxine (SYNTHROID) 25 MCG tablet Take 1 tablet (25 mcg total) by mouth daily before breakfast.  08/12/19  Yes Perlie Mayo, NP  lisinopril (ZESTRIL) 10 MG tablet Take 1 tablet (10 mg total) by mouth daily. 08/12/19  Yes Perlie Mayo, NP  rosuvastatin (CRESTOR) 5 MG tablet Take 1 tablet (5 mg total) by mouth daily at 6 PM. 08/12/19  Yes Perlie Mayo, NP  Vitamin D, Ergocalciferol, (DRISDOL) 1.25 MG (50000 UNIT) CAPS capsule Take 1 capsule (50,000 Units total) by mouth every 7 (seven) days. 08/12/19  Yes Perlie Mayo, NP  blood glucose meter kit and supplies KIT Dispense based on patient and insurance preference. Use up to four times daily as directed. (FOR ICD-9 250.00, 250.01). Patient not taking: Reported on 08/15/2019 08/11/19   Providence Lanius A, PA-C  blood glucose meter kit and supplies Dispense based on patient and insurance preference. Use up to four times daily as directed. (FOR ICD-10 E10.9, E11.9). Patient not taking: Reported on 08/15/2019 08/02/19   Heath Lark D, DO  glucose blood (GLUCOSE METER TEST) test strip Use as instructed Patient not taking: Reported on 08/15/2019 08/12/19   Perlie Mayo, NP  Insulin Pen Needle 31G X 5 MM MISC 1 Units by Does not apply route 2 (two) times daily. Patient not taking: Reported on 08/15/2019 08/02/19   Heath Lark D, DO   No Known Allergies Review of Systems  Constitutional: Positive for activity change and fatigue.  Respiratory: Positive for shortness of breath.   Cardiovascular: Positive for leg swelling.    Physical Exam Vitals and nursing note reviewed.  Constitutional:  Appearance: Normal appearance.  Cardiovascular:     Rate and Rhythm: Normal rate.     Comments: Significant lower extremity edema Pulmonary:     Effort: Pulmonary effort is normal.  Neurological:     Mental Status: He is alert and oriented to person, place, and time.     Vital Signs: BP 100/79   Pulse 92   Temp 97.8 F (36.6 C)   Resp 18   Wt 96.8 kg   SpO2 100%   BMI 31.51 kg/m  Pain Scale: 0-10   Pain Score: 0-No pain   SpO2: SpO2:  100 % O2 Device:SpO2: 100 % O2 Flow Rate: .   IO: Intake/output summary:   Intake/Output Summary (Last 24 hours) at 08/19/2019 1405 Last data filed at 08/19/2019 0900 Gross per 24 hour  Intake 240 ml  Output 1000 ml  Net -760 ml    LBM: Last BM Date: 08/14/19 Baseline Weight: Weight: 95.1 kg Most recent weight: Weight: 96.8 kg     Palliative Assessment/Data: PPS: 50%     Thank you for this consult. Palliative medicine will continue to follow and assist as needed.   Time In: 5027 Time Out: 1415 Time Total: 90 mins Greater than 50%  of this time was spent counseling and coordinating care related to the above assessment and plan.  Signed by: Mariana Kaufman, AGNP-C Palliative Medicine    Please contact Palliative Medicine Team phone at [phone] for questions and concerns.  For individual provider: See Shea Evans

## 2019-08-19 NOTE — Progress Notes (Addendum)
Call for cental telemetry, patient had 17 beat run of SVT. Heart now SR at 84. Patient resting in his bed and is arousible. Denies any chest pain. Patient voiceed that is  Ok when asked.

## 2019-08-20 DIAGNOSIS — R4189 Other symptoms and signs involving cognitive functions and awareness: Secondary | ICD-10-CM

## 2019-08-20 DIAGNOSIS — E039 Hypothyroidism, unspecified: Secondary | ICD-10-CM

## 2019-08-20 DIAGNOSIS — I5023 Acute on chronic systolic (congestive) heart failure: Secondary | ICD-10-CM

## 2019-08-20 DIAGNOSIS — I10 Essential (primary) hypertension: Secondary | ICD-10-CM

## 2019-08-20 LAB — BASIC METABOLIC PANEL
Anion gap: 10 (ref 5–15)
BUN: 36 mg/dL — ABNORMAL HIGH (ref 8–23)
CO2: 22 mmol/L (ref 22–32)
Calcium: 8.1 mg/dL — ABNORMAL LOW (ref 8.9–10.3)
Chloride: 97 mmol/L — ABNORMAL LOW (ref 98–111)
Creatinine, Ser: 1.07 mg/dL (ref 0.61–1.24)
GFR calc Af Amer: >60 mL/min (ref >60)
GFR calc non Af Amer: >60 mL/min (ref >60)
Glucose, Bld: 260 mg/dL — ABNORMAL HIGH (ref 70–99)
Potassium: 3.9 mmol/L (ref 3.5–5.1)
Sodium: 129 mmol/L — ABNORMAL LOW (ref 135–145)

## 2019-08-20 LAB — GLUCOSE, CAPILLARY
Glucose-Capillary: 211 mg/dL — ABNORMAL HIGH (ref 70–99)
Glucose-Capillary: 290 mg/dL — ABNORMAL HIGH (ref 70–99)
Glucose-Capillary: 315 mg/dL — ABNORMAL HIGH (ref 70–99)
Glucose-Capillary: 357 mg/dL — ABNORMAL HIGH (ref 70–99)

## 2019-08-20 NOTE — Progress Notes (Signed)
Patient Demographics:    Stanley Levy, is a 64 y.o. male, DOB - [birth year], BXU:[id number]  Admit date - 08/15/2019   Admitting Physician Lilyan Gilford, DO  Outpatient Primary MD for the patient is Freddy Finner, NP  LOS - 5   Chief Complaint  Patient presents with  . Shortness of Breath        Subjective:    Stanley Levy continues to have dyspnea on exertion chest pain.   Assessment  & Plan :    Principal Problem:   Intracardiac thrombosis Active Problems:   Falls frequently   Essential hypertension   Poorly controlled type 2 diabetes mellitus (HCC)   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 %   Hypothyroidism   Cognitive changes/memory Challenges   LV (left ventricular) mural thrombus   Acute systolic heart failure (HCC)   Severe left ventricular systolic dysfunction   Hyperlipidemia LDL goal <70   Goals of care, counseling/discussion   Advanced care planning/counseling discussion   Palliative care by specialist   Echo 08/16/2019 1)Left ventricular ejection fraction, by estimation, is 20%. The left ventricle has severely decreased function. The left ventricle demonstrates global hypokinesis with regional variation.  2. Large LV Mural Thrombus in Distal anterolateral position,  approximately 2.0 x 2.5 cm (trabecular muscle obscures extent to some degree).   Brief Summary:- 64 y.o.male,w/hx of hypothyroidism, HLD, uncontrolled DMII, and anxiety Admitted on 08/15/2017 with dyspnea and found to have new onset cardiomyopathy/CHF with EF of 20% and large LV mural thrombus  A/p 1)HFrEF--- EF 20%--patient with acute exacerbation of combined systolic and diastolic dysfunction CHF -Blood pressure has been soft making it difficult to treated CHF -Repeat chest x-ray on 08/19/2019 with pulmonary venous  congestion/CHF and pleural effusion --Continue Lasix  20 mg IV daily with BP hold parameters -Okay to initiate iv digoxin 0.125 mg daily -Started on Aldactone 12.5 mg daily -Hold lisinopril and discontinue Coreg due to BP concerns -Patient continues to have evidence of volume overload -Initial weights are trending down from 217 pounds to 213 pounds.  Weight today was recorded as 220 pounds which may not be accurate -Use Ace wraps for lower extremities and keep lower extremities elevated -EF 20%, Intracardiac Thrombus and soft BP makes medication management rather challenging -Cardiology consult appreciated -We will consider transfer to [facility] for advanced heart failure team management including LHC/RHC after palliative care has had further opportunities to delineate goals of care with patient -Patient has previously not been compliant  2)Intracardiac Clot/Large LV thrombus--- Eliquis per pharmacy  3)H/o Prior CVA----  MRI Brain from 08/02/19--- with Chronic Lt Frontal Lobe encephalomalacia (remote infarct)-- -okay to stop aspirin C/n  Crestor and Eliquis as ordered -Patient with some mild memory/cognitive concerns  4)DM2- A1C is 18.5,  reflecting uncontrolled DM, Lantus insulin as ordered,  -Use Novolog/Humalog Sliding scale insulin with Accu-Cheks/Fingersticks as ordered   5)Hypothyroidism--- TSH is 15.9, increased levothyroxine to 37.5 mcg from 25 , will need repeat TSH in 3 to 4 weeks  6)Social/Ethics--- plan of care discussed with patient and wife, patient is DNR, 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 who has a history of seizures and strokes  and disability -Upon discharge patient hoping to go home and states the primary caregiver for his wife however he will need to go home with home health and palliative care services   7)Generalized weakness/deconditioning--- PT eval appreciated recommends home health PT  8)BPH with LUTs--okay to try  Flomax if urinary retention persists patient will need Foley to be inserted  Disposition/Need for in-Hospital Stay- patient unable to be discharged at this time due to --- significant volume overload requiring IV diuresis-- EF 20%, Intracardiac Thrombus and soft BP makes medication management rather challenging -Remains volume overloaded, not medically ready for discharge home with home health at this time  Code Status : DNR  Family Communication:   Discussed with patient  Consults  : Cardiology and palliative care  DVT Prophylaxis  : Eliquis- SCDs   Lab Results  Component Value Date   PLT 147 (L) 08/18/2019   Inpatient Medications  Scheduled Meds: . apixaban  10 mg Oral BID   Followed by  . [START ON 08/23/2019] apixaban  5 mg Oral BID  . digoxin  0.125 mg Intravenous Daily  . furosemide  20 mg Intravenous Daily  . gabapentin  100 mg Oral TID  . imipramine  50 mg Oral QHS  . insulin aspart  0-15 Units Subcutaneous TID WC  . insulin aspart  0-5 Units Subcutaneous QHS  . insulin detemir  5 Units Subcutaneous QHS  . levothyroxine  37.5 mcg Oral QAC breakfast  . polyethylene glycol  17 g Oral BID  . rosuvastatin  5 mg Oral q1800  . sodium chloride flush  3 mL Intravenous Q12H  . spironolactone  12.5 mg Oral Daily  . tamsulosin  0.4 mg Oral QPC supper   Continuous Infusions: . sodium chloride 250 mL (08/19/19 0701)   PRN Meds:.sodium chloride, acetaminophen, ondansetron (ZOFRAN) IV, sodium chloride flush    Anti-infectives (From admission, onward)   None        Objective:   Vitals:   08/19/19 2149 08/20/19 0500 08/20/19 0521 08/20/19 1432  BP: 100/78  (!) 117/45 115/76  Pulse: 100  88 93  Resp: 20  (!) 24 20  Temp: 97.9 F (36.6 C)  98.3 F (36.8 C) 98.4 F (36.9 C)  TempSrc: Oral   Oral  SpO2: 100%  97% 100%  Weight:  100 kg      Wt Readings from Last 3 Encounters:  08/20/19 100 kg  08/12/19 93 kg  08/11/19 93 kg     Intake/Output Summary (Last 24  hours) at 08/20/2019 1926 Last data filed at 08/20/2019 1700 Gross per 24 hour  Intake 1040 ml  Output 1200 ml  Net -160 ml     Physical Exam  General exam: Alert, awake, oriented x 3 Respiratory system: crackles at bases. Respiratory effort normal. Cardiovascular system:RRR. No murmurs, rubs, gallops. Gastrointestinal system: Abdomen is nondistended, soft and nontender. No organomegaly or masses felt. Normal bowel sounds heard. Central nervous system: Alert and oriented. No focal neurological deficits. Extremities: 2+ edema bilaterally Skin: No rashes, lesions or ulcers Psychiatry: Judgement and insight appear normal. Mood & affect appropriate.     Data Review:   Micro Results Recent Results (from the past 240 hour(s))  Respiratory Panel by RT PCR (Flu A&B, Covid) - Nasopharyngeal Swab     Status: None   Collection Time: 08/15/19  9:52 PM   Specimen: Nasopharyngeal Swab  Result Value Ref Range Status   SARS Coronavirus 2 by RT PCR NEGATIVE NEGATIVE Final  Comment: (NOTE) SARS-CoV-2 target nucleic acids are NOT DETECTED. The SARS-CoV-2 RNA is generally detectable in upper respiratoy specimens during the acute phase of infection. The lowest concentration of SARS-CoV-2 viral copies this assay can detect is 131 copies/mL. A negative result does not preclude SARS-Cov-2 infection and should not be used as the sole basis for treatment or other patient management decisions. A negative result may occur with  improper specimen collection/handling, submission of specimen other than nasopharyngeal swab, presence of viral mutation(s) within the areas targeted by this assay, and inadequate number of viral copies (<131 copies/mL). A negative result must be combined with clinical observations, patient history, and epidemiological information. The expected result is Negative. Fact Sheet for Patients:  https://www.moore.com/ Fact Sheet for Healthcare Providers:    https://www.young.biz/ This test is not yet ap proved or cleared by the [place] FDA and  has been authorized for detection and/or diagnosis of SARS-CoV-2 by FDA under an Emergency Use Authorization (EUA). This EUA will remain  in effect (meaning this test can be used) for the duration of the COVID-19 declaration under Section 564(b)(1) of the Act, 21 U.S.C. section 360bbb-3(b)(1), unless the authorization is terminated or revoked sooner.    Influenza A by PCR NEGATIVE NEGATIVE Final   Influenza B by PCR NEGATIVE NEGATIVE Final    Comment: (NOTE) The Xpert Xpress SARS-CoV-2/FLU/RSV assay is intended as an aid in  the diagnosis of influenza from Nasopharyngeal swab specimens and  should not be used as a sole basis for treatment. Nasal washings and  aspirates are unacceptable for Xpert Xpress SARS-CoV-2/FLU/RSV  testing. Fact Sheet for Patients: https://www.moore.com/ Fact Sheet for Healthcare Providers: https://www.young.biz/ This test is not yet approved or cleared by the [place] FDA and  has been authorized for detection and/or diagnosis of SARS-CoV-2 by  FDA under an Emergency Use Authorization (EUA). This EUA will remain  in effect (meaning this test can be used) for the duration of the  Covid-19 declaration under Section 564(b)(1) of the Act, 21  U.S.C. section 360bbb-3(b)(1), unless the authorization is  terminated or revoked. Performed at [facility], [address] [place], [place] [address]     Radiology Reports CT HEAD WO CONTRAST  Result Date: 08/01/2019 CLINICAL DATA:  Multiple falls. EXAM: CT HEAD WITHOUT CONTRAST TECHNIQUE: Contiguous axial images were obtained from the base of the skull through the vertex without intravenous contrast. COMPARISON:  None. FINDINGS: Brain: There is moderate severity cerebral atrophy with widening of the extra-axial spaces and ventricular dilatation. There are areas of  decreased attenuation within the white matter tracts of the supratentorial brain, consistent with microvascular disease changes. Adjacent 1 mm and 2 mm foci of white matter calcification are seen along the posterior aspect of the centrum semiovale on the left. Vascular: No hyperdense vessel or unexpected calcification. Skull: Normal. Negative for fracture or focal lesion. Sinuses/Orbits: No acute finding. Other: None. IMPRESSION: 1. Generalized cerebral atrophy. 2. No acute intracranial abnormality. Electronically Signed   By: Aram Candela M.D.   On: 08/01/2019 20:08   CT Angio Chest PE W/Cm &/Or Wo Cm  Result Date: 08/15/2019 CLINICAL DATA:  Short of breath, elevated D dimer EXAM: CT ANGIOGRAPHY CHEST WITH CONTRAST TECHNIQUE: Multidetector CT imaging of the chest was performed using the standard protocol during bolus administration of intravenous contrast. Multiplanar CT image reconstructions and MIPs were obtained to evaluate the vascular anatomy. CONTRAST:  OMNIPAQUE IOHEXOL 350 MG/ML SOLN COMPARISON:  08/15/2019 FINDINGS: Cardiovascular: This is a technically adequate evaluation  of the pulmonary vasculature. There are no filling defects or pulmonary emboli. Heart is enlarged without pericardial effusion. Thoracic aorta is normal in caliber. Mediastinum/Nodes: Multiple subcentimeter lymph nodes within the mediastinum and hilar nonspecific. No pathologic adenopathy. Lungs/Pleura: There are bilateral pleural effusions volume estimated less than 1 L each. Significant compressive atelectasis within the right lower lobe. Central airways are patent. No pneumothorax. Upper Abdomen: No acute abnormality. Musculoskeletal: No acute or destructive bony lesions. Reconstructed images demonstrate no additional findings. Review of the MIP images confirms the above findings. IMPRESSION: 1. No evidence of pulmonary embolus. 2. Bilateral pleural effusions with lower lobe compressive atelectasis. 3. Cardiomegaly.  Electronically Signed   By: Sharlet Salina M.D.   On: 08/15/2019 21:55   MR BRAIN WO CONTRAST  Result Date: 08/02/2019 CLINICAL DATA:  64 year old male with multiple falls. Encephalopathy, fatigue. EXAM: MRI HEAD WITHOUT CONTRAST TECHNIQUE: Multiplanar, multiecho pulse sequences of the brain and surrounding structures were obtained without intravenous contrast. COMPARISON:  Head CT yesterday. FINDINGS: Brain: Confluent left superior frontal lobe white matter gliosis and encephalomalacia in continuity with the left lateral ventricle. No restricted diffusion to suggest acute infarction. No midline shift, mass effect, evidence of mass lesion, ventriculomegaly, extra-axial collection or acute intracranial hemorrhage. Cervicomedullary junction and pituitary are within normal limits. Additional Patchy and confluent bilateral cerebral white matter T2 and FLAIR hyperintensity. No cortical encephalomalacia or chronic cerebral blood products identified. The deep gray nuclei, brainstem and cerebellum are within normal limits. Vascular: Major intracranial vascular flow voids are preserved. Skull and upper cervical spine: Negative visible cervical spine. Visualized bone marrow signal is within normal limits. Sinuses/Orbits: Negative orbits. Trace paranasal sinus mucosal thickening. Other: Mastoids are clear. Visible internal auditory structures appear normal. Scalp and face soft tissues appear negative. IMPRESSION: 1. No acute intracranial abnormality. 2. 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 Additional moderate for age cerebral white matter signal changes, most commonly due to chronic small vessel disease. Electronically Signed   By: Odessa Fleming M.D.   On: 08/02/2019 07:44   DG Chest Port 1 View  Result Date: 08/19/2019 CLINICAL DATA:  Shortness of breath on exertion. EXAM: PORTABLE CHEST 1 VIEW COMPARISON:  08/15/2019 FINDINGS: Stable enlarged cardiac silhouette and prominent  hilar vessels. Clear lungs with stable mildly prominent bilateral upper lung zone vascularity. Small left pleural effusion, mildly increased. No right pleural fluid seen today. Thoracic spine degenerative changes. IMPRESSION: 1. Small left pleural effusion, mildly increased. 2. Stable cardiomegaly and mild pulmonary vascular congestion. Electronically Signed   By: Beckie Salts M.D.   On: 08/19/2019 08:35   DG Chest Portable 1 View  Result Date: 08/15/2019 CLINICAL DATA:  Dyspnea. EXAM: PORTABLE CHEST 1 VIEW COMPARISON:  08/01/2019 FINDINGS: The heart size remains enlarged. There is no pneumothorax. There may be a trace right-sided pleural effusion. No acute osseous abnormality. IMPRESSION: No active disease. Electronically Signed   By: Katherine Mantle M.D.   On: 08/15/2019 20:12   DG Chest Portable 1 View  Result Date: 08/01/2019 CLINICAL DATA:  Change in mental status EXAM: PORTABLE CHEST 1 VIEW COMPARISON:  None. FINDINGS: The heart size and mediastinal contours are within normal limits. Both lungs are clear. The visualized skeletal structures are unremarkable. IMPRESSION: No active disease. Electronically Signed   By: Jonna Clark M.D.   On: 08/01/2019 17:46   ECHOCARDIOGRAM COMPLETE  Result Date: 08/16/2019    ECHOCARDIOGRAM REPORT   Patient Name:   TARYLL REICHENBERGER Date of Exam: 08/16/2019 Medical  Rec #:  [id number]      Height:       69.0 in Accession #:    [id number]     Weight:       209.7 lb Date of Birth:  Sep 18, 1955       BSA:          2.11 m Patient Age:    63 years       BP:           108/73 mmHg Patient Gender: M              HR:           90 bpm. [facility] Procedure: 2D Echo Indications:    Congestive Heart Failure 428.0 / I50.9  History:        Patient has no prior history of Echocardiogram examinations.                 CHF; Risk Factors:Non-Smoker, Dyslipidemia and Diabetes.                 Hyperglycemia due to diabetes mellitus.  Sonographer:    Jeryl Columbia RDCS (AE)  Referring Phys: [id number] ASIA B ZIERLE-GHOSH IMPRESSIONS  1. Left ventricular ejection fraction, by estimation, is 20%. The left ventricle has severely decreased function. The left ventricle demonstrates global hypokinesis with regional variation.  2. Large LV mural thrombus in distal anterolateral position, approximately 2.0 x 2.5 cm (trabecular muscle obscures extent to some degree). Reported to Dr. Mariea Clonts.  3. Right ventricular systolic function is moderately reduced. The right ventricular size is normal. There is mildly elevated pulmonary artery systolic pressure. The estimated right ventricular systolic pressure is 32.4 mmHg.  4. Left atrial size was mildly dilated.  5. Right atrial size was moderately dilated.  6. The mitral valve is grossly normal. Trivial mitral valve regurgitation.  7. The aortic valve is tricuspid. Aortic valve regurgitation is not visualized.  8. The inferior vena cava is normal in size with greater than 50% respiratory variability, suggesting right atrial pressure of 3 mmHg. FINDINGS  Left Ventricle: Left ventricular ejection fraction, by estimation, is 20%. The left ventricle has severely decreased function. The left ventricle demonstrates global hypokinesis. The left ventricular internal cavity size was normal in size. There is no left ventricular hypertrophy. Left ventricular diastolic parameters are consistent with Grade II diastolic dysfunction (pseudonormalization). Right Ventricle: The right ventricular size is normal. No increase in right ventricular wall thickness. Right ventricular systolic function is moderately reduced. There is mildly elevated pulmonary artery systolic pressure. The tricuspid regurgitant velocity is 2.71 m/s, and with an assumed right atrial pressure of 3 mmHg, the estimated right ventricular systolic pressure is 32.4 mmHg. Left Atrium: Left atrial size was mildly dilated. Right Atrium: Right atrial size was moderately dilated. Pericardium: Trivial  pericardial effusion is present. The pericardial effusion is posterior to the left ventricle. Mitral Valve: The mitral valve is grossly normal. Trivial mitral valve regurgitation. Tricuspid Valve: The tricuspid valve is grossly normal. Tricuspid valve regurgitation is mild. Aortic Valve: The aortic valve is tricuspid. Aortic valve regurgitation is not visualized. Pulmonic Valve: The pulmonic valve was grossly normal. Pulmonic valve regurgitation is not visualized. Aorta: The aortic root is normal in size and structure. Venous: The inferior vena cava is normal in size with greater than 50% respiratory variability, suggesting right atrial pressure of 3 mmHg. IAS/Shunts: No atrial level shunt detected by color flow Doppler.  LEFT VENTRICLE PLAX 2D LVIDd:  5.32 cm  Diastology LVIDs:         5.07 cm  LV e' lateral:   8.05 cm/s LV PW:         1.09 cm  LV E/e' lateral: 10.8 LV IVS:        0.86 cm  LV e' medial:    5.98 cm/s LVOT diam:     2.00 cm  LV E/e' medial:  14.6 LV SV Index:   6.61 LVOT Area:     3.14 cm  RIGHT VENTRICLE RV S prime:     9.36 cm/s TAPSE (M-mode): 1.8 cm LEFT ATRIUM             Index       RIGHT ATRIUM           Index LA diam:        3.70 cm 1.76 cm/m  RA Area:     28.20 cm LA Vol (A2C):   83.1 ml 39.42 ml/m RA Volume:   106.00 ml 50.29 ml/m LA Vol (A4C):   56.9 ml 26.99 ml/m LA Biplane Vol: 71.2 ml 33.78 ml/m   AORTA Ao Root diam: 3.40 cm MITRAL VALVE               TRICUSPID VALVE MV Area (PHT): 4.10 cm    TR Peak grad:   29.4 mmHg MV Decel Time: 185 msec    TR Vmax:        271.00 cm/s MV E velocity: 87.30 cm/s MV A velocity: 65.00 cm/s  SHUNTS MV E/A ratio:  1.34        Systemic Diam: 2.00 cm Nona Dell MD Electronically signed by Nona Dell MD Signature Date/Time: 08/16/2019/11:45:40 AM    Final      CBC Recent Labs  Lab 08/15/19 1955 08/16/19 0557 08/18/19 0526  WBC 9.4 8.7 6.9  HGB 13.8 13.7 13.7  HCT 42.7 42.9 42.0  PLT 157 129* 147*  MCV 99.3 99.1 97.2  MCH  32.1 31.6 31.7  MCHC 32.3 31.9 32.6  RDW 15.8* 15.9* 15.2  LYMPHSABS 1.5 2.0  --   MONOABS 0.8 0.8  --   EOSABS 0.0 0.0  --   BASOSABS 0.0 0.0  --     Chemistries  Recent Labs  Lab 08/15/19 1955 08/15/19 1955 08/16/19 0557 08/17/19 0548 08/18/19 0526 08/19/19 0438 08/20/19 0617  NA 131*   < > 136 138 133* 133* 129*  K 5.0   < > 3.6 4.0 3.6 3.5 3.9  CL 100   < > 101 101 98 98 97*  CO2 22   < > 23 27 21* 24 22  GLUCOSE 415*   < > 238* 55* 155* 85 260*  BUN 18   < > 16 21 29* 32* 36*  CREATININE 1.11   < > 0.85 0.94 1.10 1.11 1.07  CALCIUM 8.7*   < > 8.8* 8.5* 8.3* 8.3* 8.1*  MG  --   --  1.7  --   --  1.9  --   AST 45*  --  46*  --   --   --   --   ALT 69*  --  65*  --   --   --   --   ALKPHOS 422*  --  432*  --   --   --   --   BILITOT 1.9*  --  1.6*  --   --   --   --    < > =  values in this interval not displayed.   ----------------------------------------------------------------------------------------------------------------- No results for input(s): CHOL, HDL, LDLCALC, TRIG, CHOLHDL, LDLDIRECT in the last 72 hours.  Lab Results  Component Value Date   HGBA1C >18.5 (H) 08/01/2019   ----------------------------------------------------------------------------------------------------------------- No results for input(s): TSH, T4TOTAL, T3FREE, THYROIDAB in the last 72 hours.  Invalid input(s): FREET3 ------------------------------------------------------------------------------------------------------------------ No results for input(s): VITAMINB12, FOLATE, FERRITIN, TIBC, IRON, RETICCTPCT in the last 72 hours.  Coagulation profile No results for input(s): INR, PROTIME in the last 168 hours.  No results for input(s): DDIMER in the last 72 hours.  Cardiac Enzymes No results for input(s): CKMB, TROPONINI, MYOGLOBIN in the last 168 hours.  Invalid input(s):  CK ------------------------------------------------------------------------------------------------------------------    Component Value Date/Time   BNP 1,165.0 (H) 08/15/2019 1955    Erick Blinks M.D on 08/20/2019 at 7:26 PM  Go to [email or web address] - for contact info  [facility] - Office  [phone]

## 2019-08-21 LAB — COMPREHENSIVE METABOLIC PANEL
ALT: 100 U/L — ABNORMAL HIGH (ref 0–44)
AST: 68 U/L — ABNORMAL HIGH (ref 15–41)
Albumin: 2.7 g/dL — ABNORMAL LOW (ref 3.5–5.0)
Alkaline Phosphatase: 593 U/L — ABNORMAL HIGH (ref 38–126)
Anion gap: 9 (ref 5–15)
BUN: 31 mg/dL — ABNORMAL HIGH (ref 8–23)
CO2: 25 mmol/L (ref 22–32)
Calcium: 8.4 mg/dL — ABNORMAL LOW (ref 8.9–10.3)
Chloride: 97 mmol/L — ABNORMAL LOW (ref 98–111)
Creatinine, Ser: 0.98 mg/dL (ref 0.61–1.24)
GFR calc Af Amer: >60 mL/min (ref >60)
GFR calc non Af Amer: >60 mL/min (ref >60)
Glucose, Bld: 161 mg/dL — ABNORMAL HIGH (ref 70–99)
Potassium: 4 mmol/L (ref 3.5–5.1)
Sodium: 131 mmol/L — ABNORMAL LOW (ref 135–145)
Total Bilirubin: 1.1 mg/dL (ref 0.3–1.2)
Total Protein: 5.8 g/dL — ABNORMAL LOW (ref 6.5–8.1)

## 2019-08-21 LAB — GLUCOSE, CAPILLARY
Glucose-Capillary: 117 mg/dL — ABNORMAL HIGH (ref 70–99)
Glucose-Capillary: 225 mg/dL — ABNORMAL HIGH (ref 70–99)
Glucose-Capillary: 232 mg/dL — ABNORMAL HIGH (ref 70–99)
Glucose-Capillary: 301 mg/dL — ABNORMAL HIGH (ref 70–99)

## 2019-08-21 LAB — MAGNESIUM: Magnesium: 2 mg/dL (ref 1.7–2.4)

## 2019-08-21 NOTE — Progress Notes (Addendum)
Patient Demographics:    Stanley Levy, is a 64 y.o. male, DOB - 08/15/1955, XKG:[id number]  Admit date - 08/15/2019   Admitting Physician Rolla Plate, DO  Outpatient Primary MD for the patient is Perlie Mayo, NP  LOS - 6   Chief Complaint  Patient presents with  . Shortness of Breath        Subjective:    Stanley Levy no nausea or vomitting. Continues to have edema. No chest pain. 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 The left ventricle has severely decreased function. The left ventricle demonstrates global hypokinesis with regional variation.  2. Large LV Mural Thrombus in Distal anterolateral position,  approximately 2.0 x 2.5 cm (trabecular muscle obscures extent to some degree).   Brief Summary:- 64 y.o.male,w/hx of hypothyroidism, HLD, uncontrolled DMII, and anxiety Admitted on 08/15/2017 with dyspnea and found to have new onset cardiomyopathy/CHF with EF of 20% and large LV mural thrombus  A/p 1)HFrEF--- EF 20%--patient with acute exacerbation of combined systolic and diastolic dysfunction CHF -Blood pressure has been soft making it difficult to treated CHF -Repeat chest x-ray on  08/19/2019 with pulmonary venous congestion/CHF and pleural effusion --Continue Lasix  20 mg IV daily with BP hold parameters -Okay to initiate iv digoxin 0.125 mg daily -Started on Aldactone 12.5 mg daily -Hold lisinopril and discontinue Coreg due to BP concerns -Patient continues to have evidence of volume overload -Initial weights are trending down from 217 pounds to 213 pounds.  Weight today was recorded as 220 pounds which may not be accurate -Use Ace wraps for lower extremities and keep lower extremities elevated -EF 20%, Intracardiac Thrombus and soft BP makes medication management rather challenging -Cardiology consult appreciated -We will consider transfer to [facility] for advanced heart failure team management including LHC/RHC after palliative care has had further opportunities to delineate goals of care with patient -Patient has previously not been compliant  2)Intracardiac Clot/Large LV thrombus--- Eliquis per pharmacy  3)H/o Prior CVA----  MRI Brain from 08/02/19--- with Chronic Lt Frontal Lobe encephalomalacia (remote infarct)-- -okay to stop aspirin C/n  Crestor and Eliquis as ordered -Patient with some mild memory/cognitive concerns  4)DM2- A1C is 18.5,  reflecting uncontrolled DM, Lantus insulin as ordered,  -Use Novolog/Humalog Sliding scale insulin with Accu-Cheks/Fingersticks as ordered   5)Hypothyroidism--- TSH is 15.9, increased levothyroxine to 37.5 mcg from 25 , will need repeat TSH in 3 to 4 weeks  6)Social/Ethics--- plan of care discussed with patient and wife, patient is DNR, 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 No abdominal pain. Will check abd ultrasound.  Disposition/Need for in-Hospital Stay- patient unable to be discharged at this time due to --- significant volume overload requiring IV diuresis-- EF 20%, Intracardiac Thrombus and soft BP makes medication management rather challenging -Remains volume overloaded, not medically ready for discharge home with home health at this time  Code Status : DNR  Family Communication:   Discussed with patient  Consults  : Cardiology and palliative care  DVT Prophylaxis  : Eliquis- SCDs   Lab Results  Component Value Date   PLT 147 (L) 08/18/2019   Inpatient Medications  Scheduled Meds: . apixaban  10 mg Oral BID   Followed by  . [START ON 08/23/2019] apixaban  5 mg Oral BID  . digoxin  0.125 mg Intravenous Daily  . furosemide  20 mg Intravenous Daily  . gabapentin  100 mg Oral TID  . imipramine  50 mg Oral QHS  . insulin aspart  0-15 Units Subcutaneous TID WC  . insulin aspart  0-5 Units Subcutaneous QHS  . insulin detemir  5 Units Subcutaneous QHS  . levothyroxine  37.5 mcg Oral QAC breakfast  . polyethylene glycol  17 g Oral BID  . rosuvastatin  5 mg Oral q1800  . sodium chloride flush  3 mL Intravenous Q12H  . spironolactone  12.5 mg Oral Daily  . tamsulosin  0.4 mg Oral QPC supper   Continuous Infusions: . sodium chloride 250 mL (08/19/19 0701)   PRN Meds:.sodium chloride, acetaminophen, ondansetron (ZOFRAN) IV, sodium chloride flush    Anti-infectives (From admission, onward)   None        Objective:   Vitals:   08/20/19 2125 08/21/19 0607 08/21/19 1329 08/21/19 1410  BP: 116/74 117/81 116/76   Pulse: 96 82 87   Resp: 20  16   Temp: 98.7 F (37.1 C)  98.3 F (36.8 C)   TempSrc: Oral  Oral   SpO2: 100% 100% (!) 88% 100%  Weight:  101.2 kg      Wt Readings from Last 3 Encounters:   08/21/19 101.2 kg  08/12/19 93 kg  08/11/19 93 kg     Intake/Output Summary (Last 24 hours) at 08/21/2019 1823 Last data filed at 08/21/2019 0900 Gross per 24 hour  Intake 480 ml  Output --  Net 480 ml     Physical Exam  General exam: Alert, awake, oriented x 3 Respiratory system: crackles at bases Respiratory effort normal. Cardiovascular system:RRR. No murmurs, rubs, gallops. Gastrointestinal system: Abdomen is nondistended, soft and nontender. No organomegaly or masses felt. Normal bowel sounds heard. Central nervous system: Alert and oriented. No focal neurological deficits. Extremities: 1+ edema bilaterally Skin: No rashes, lesions or ulcers Psychiatry: Judgement and insight appear normal. Mood & affect appropriate.      Data Review:   Micro Results Recent Results (from the past 240 hour(s))  Respiratory Panel by RT PCR (Flu A&B, Covid) - Nasopharyngeal Swab     Status: None   Collection Time: 08/15/19  9:52 PM   Specimen: Nasopharyngeal Swab  Result Value  Ref Range Status   SARS Coronavirus 2 by RT PCR NEGATIVE NEGATIVE Final    Comment: (NOTE) SARS-CoV-2 target nucleic acids are NOT DETECTED. The SARS-CoV-2 RNA is generally detectable in upper respiratoy specimens during the acute phase of infection. The lowest concentration of SARS-CoV-2 viral copies this assay can detect is 131 copies/mL. A negative result does not preclude SARS-Cov-2 infection and should not be used as the sole basis for treatment or other patient management decisions. A negative result may occur with  improper specimen collection/handling, submission of specimen other than nasopharyngeal swab, presence of viral mutation(s) within the areas targeted by this assay, and inadequate number of viral copies (<131 copies/mL). A negative result must be combined with clinical observations, patient history, and epidemiological information. The expected result is Negative. Fact Sheet for Patients:   PinkCheek.be Fact Sheet for Healthcare Providers:  GravelBags.it This test is not yet ap proved or cleared by the [place] FDA and  has been authorized for detection and/or diagnosis of SARS-CoV-2 by FDA under an Emergency Use Authorization (EUA). This EUA will remain  in effect (meaning this test can be used) for the duration of the COVID-19 declaration under Section 564(b)(1) of the Act, 21 U.S.C. section 360bbb-3(b)(1), unless the authorization is terminated or revoked sooner.    Influenza A by PCR NEGATIVE NEGATIVE Final   Influenza B by PCR NEGATIVE NEGATIVE Final    Comment: (NOTE) The Xpert Xpress SARS-CoV-2/FLU/RSV assay is intended as an aid in  the diagnosis of influenza from Nasopharyngeal swab specimens and  should not be used as a sole basis for treatment. Nasal washings and  aspirates are unacceptable for Xpert Xpress SARS-CoV-2/FLU/RSV  testing. Fact Sheet for Patients: PinkCheek.be Fact Sheet for Healthcare Providers: GravelBags.it This test is not yet approved or cleared by the [place] FDA and  has been authorized for detection and/or diagnosis of SARS-CoV-2 by  FDA under an Emergency Use Authorization (EUA). This EUA will remain  in effect (meaning this test can be used) for the duration of the  Covid-19 declaration under Section 564(b)(1) of the Act, 21  U.S.C. section 360bbb-3(b)(1), unless the authorization is  terminated or revoked. Performed at [facility], [address] [place], [place] [address]     Radiology Reports CT HEAD WO CONTRAST  Result Date: 08/01/2019 CLINICAL DATA:  Multiple falls. EXAM: CT HEAD WITHOUT CONTRAST TECHNIQUE: Contiguous axial images were obtained from the base of the skull through the vertex without intravenous contrast. COMPARISON:  None. FINDINGS: Brain: There is moderate severity cerebral atrophy with  widening of the extra-axial spaces and ventricular dilatation. There are areas of decreased attenuation within the white matter tracts of the supratentorial brain, consistent with microvascular disease changes. Adjacent 1 mm and 2 mm foci of white matter calcification are seen along the posterior aspect of the centrum semiovale on the left. Vascular: No hyperdense vessel or unexpected calcification. Skull: Normal. Negative for fracture or focal lesion. Sinuses/Orbits: No acute finding. Other: None. IMPRESSION: 1. Generalized cerebral atrophy. 2. No acute intracranial abnormality. Electronically Signed   By: Virgina Norfolk M.D.   On: 08/01/2019 20:08   CT Angio Chest PE W/Cm &/Or Wo Cm  Result Date: 08/15/2019 CLINICAL DATA:  Short of breath, elevated D dimer EXAM: CT ANGIOGRAPHY CHEST WITH CONTRAST TECHNIQUE: Multidetector CT imaging of the chest was performed using the standard protocol during bolus administration of intravenous contrast. Multiplanar CT image reconstructions and MIPs were obtained to evaluate the vascular anatomy. CONTRAST:  17m  OMNIPAQUE IOHEXOL 350 MG/ML SOLN COMPARISON:  08/15/2019 FINDINGS: Cardiovascular: This is a technically adequate evaluation of the pulmonary vasculature. There are no filling defects or pulmonary emboli. Heart is enlarged without pericardial effusion. Thoracic aorta is normal in caliber. Mediastinum/Nodes: Multiple subcentimeter lymph nodes within the mediastinum and hilar nonspecific. No pathologic adenopathy. Lungs/Pleura: There are bilateral pleural effusions volume estimated less than 1 L each. Significant compressive atelectasis within the right lower lobe. Central airways are patent. No pneumothorax. Upper Abdomen: No acute abnormality. Musculoskeletal: No acute or destructive bony lesions. Reconstructed images demonstrate no additional findings. Review of the MIP images confirms the above findings. IMPRESSION: 1. No evidence of pulmonary embolus. 2.  Bilateral pleural effusions with lower lobe compressive atelectasis. 3. Cardiomegaly. Electronically Signed   By: Randa Ngo M.D.   On: 08/15/2019 21:55   MR BRAIN WO CONTRAST  Result Date: 08/02/2019 CLINICAL DATA:  64 year old male with multiple falls. Encephalopathy, fatigue. EXAM: MRI HEAD WITHOUT CONTRAST TECHNIQUE: Multiplanar, multiecho pulse sequences of the brain and surrounding structures were obtained without intravenous contrast. COMPARISON:  Head CT yesterday. FINDINGS: Brain: Confluent left superior frontal lobe white matter gliosis and encephalomalacia in continuity with the left lateral ventricle. No restricted diffusion to suggest acute infarction. No midline shift, mass effect, evidence of mass lesion, ventriculomegaly, extra-axial collection or acute intracranial hemorrhage. Cervicomedullary junction and pituitary are within normal limits. Additional Patchy and confluent bilateral cerebral white matter T2 and FLAIR hyperintensity. No cortical encephalomalacia or chronic cerebral blood products identified. The deep gray nuclei, brainstem and cerebellum are within normal limits. Vascular: Major intracranial vascular flow voids are preserved. Skull and upper cervical spine: Negative visible cervical spine. Visualized bone marrow signal is within normal limits. Sinuses/Orbits: Negative orbits. Trace paranasal sinus mucosal thickening. Other: Mastoids are clear. Visible internal auditory structures appear normal. Scalp and face soft tissues appear negative. IMPRESSION: 1. No acute intracranial abnormality. 2. 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 Additional moderate for age cerebral white matter signal changes, most commonly due to chronic small vessel disease. Electronically Signed   By: Genevie Ann M.D.   On: 08/02/2019 07:44   DG Chest Port 1 View  Result Date: 08/19/2019 CLINICAL DATA:  Shortness of breath on exertion. EXAM: PORTABLE CHEST 1 VIEW  COMPARISON:  08/15/2019 FINDINGS: Stable enlarged cardiac silhouette and prominent hilar vessels. Clear lungs with stable mildly prominent bilateral upper lung zone vascularity. Small left pleural effusion, mildly increased. No right pleural fluid seen today. Thoracic spine degenerative changes. IMPRESSION: 1. Small left pleural effusion, mildly increased. 2. Stable cardiomegaly and mild pulmonary vascular congestion. Electronically Signed   By: Claudie Revering M.D.   On: 08/19/2019 08:35   DG Chest Portable 1 View  Result Date: 08/15/2019 CLINICAL DATA:  Dyspnea. EXAM: PORTABLE CHEST 1 VIEW COMPARISON:  08/01/2019 FINDINGS: The heart size remains enlarged. There is no pneumothorax. There may be a trace right-sided pleural effusion. No acute osseous abnormality. IMPRESSION: No active disease. Electronically Signed   By: Constance Holster M.D.   On: 08/15/2019 20:12   DG Chest Portable 1 View  Result Date: 08/01/2019 CLINICAL DATA:  Change in mental status EXAM: PORTABLE CHEST 1 VIEW COMPARISON:  None. FINDINGS: The heart size and mediastinal contours are within normal limits. Both lungs are clear. The visualized skeletal structures are unremarkable. IMPRESSION: No active disease. Electronically Signed   By: Prudencio Pair M.D.   On: 08/01/2019 17:46   ECHOCARDIOGRAM COMPLETE  Result Date: 08/16/2019  ECHOCARDIOGRAM REPORT   Patient Name:   ANJELO PULLMAN Date of Exam: 08/16/2019 Medical Rec #:  [id number]      Height:       69.0 in Accession #:    [id number]     Weight:       209.7 lb Date of Birth:  Feb 04, 1956       BSA:          2.11 m Patient Age:    3 years       BP:           108/73 mmHg Patient Gender: M              HR:           90 bpm. Exam Location:  [facility] Procedure: 2D Echo Indications:    Congestive Heart Failure 428.0 / I50.9  History:        Patient has no prior history of Echocardiogram examinations.                 CHF; Risk Factors:Non-Smoker, Dyslipidemia and Diabetes.                  Hyperglycemia due to diabetes mellitus.  Sonographer:    Leavy Cella RDCS (AE) Referring Phys: [id number] ASIA B [place]  1. Left ventricular ejection fraction, by estimation, is 20%. The left ventricle has severely decreased function. The left ventricle demonstrates global hypokinesis with regional variation.  2. Large LV mural thrombus in distal anterolateral position, approximately 2.0 x 2.5 cm (trabecular muscle obscures extent to some degree). Reported to Dr. Denton Brick.  3. Right ventricular systolic function is moderately reduced. The right ventricular size is normal. There is mildly elevated pulmonary artery systolic pressure. The estimated right ventricular systolic pressure is 12.8 mmHg.  4. Left atrial size was mildly dilated.  5. Right atrial size was moderately dilated.  6. The mitral valve is grossly normal. Trivial mitral valve regurgitation.  7. The aortic valve is tricuspid. Aortic valve regurgitation is not visualized.  8. The inferior vena cava is normal in size with greater than 50% respiratory variability, suggesting right atrial pressure of 3 mmHg. FINDINGS  Left Ventricle: Left ventricular ejection fraction, by estimation, is 20%. The left ventricle has severely decreased function. The left ventricle demonstrates global hypokinesis. The left ventricular internal cavity size was normal in size. There is no left ventricular hypertrophy. Left ventricular diastolic parameters are consistent with Grade II diastolic dysfunction (pseudonormalization). Right Ventricle: The right ventricular size is normal. No increase in right ventricular wall thickness. Right ventricular systolic function is moderately reduced. There is mildly elevated pulmonary artery systolic pressure. The tricuspid regurgitant velocity is 2.71 m/s, and with an assumed right atrial pressure of 3 mmHg, the estimated right ventricular systolic pressure is 11.8 mmHg. Left Atrium: Left atrial size was mildly dilated.  Right Atrium: Right atrial size was moderately dilated. Pericardium: Trivial pericardial effusion is present. The pericardial effusion is posterior to the left ventricle. Mitral Valve: The mitral valve is grossly normal. Trivial mitral valve regurgitation. Tricuspid Valve: The tricuspid valve is grossly normal. Tricuspid valve regurgitation is mild. Aortic Valve: The aortic valve is tricuspid. Aortic valve regurgitation is not visualized. Pulmonic Valve: The pulmonic valve was grossly normal. Pulmonic valve regurgitation is not visualized. Aorta: The aortic root is normal in size and structure. Venous: The inferior vena cava is normal in size with greater than 50% respiratory variability, suggesting right atrial pressure of 3 mmHg. IAS/Shunts:  No atrial level shunt detected by color flow Doppler.  LEFT VENTRICLE PLAX 2D LVIDd:         5.32 cm  Diastology LVIDs:         5.07 cm  LV e' lateral:   8.05 cm/s LV PW:         1.09 cm  LV E/e' lateral: 10.8 LV IVS:        0.86 cm  LV e' medial:    5.98 cm/s LVOT diam:     2.00 cm  LV E/e' medial:  14.6 LV SV Index:   6.61 LVOT Area:     3.14 cm  RIGHT VENTRICLE RV S prime:     9.36 cm/s TAPSE (M-mode): 1.8 cm LEFT ATRIUM             Index       RIGHT ATRIUM           Index LA diam:        3.70 cm 1.76 cm/m  RA Area:     28.20 cm LA Vol (A2C):   83.1 ml 39.42 ml/m RA Volume:   106.00 ml 50.29 ml/m LA Vol (A4C):   56.9 ml 26.99 ml/m LA Biplane Vol: 71.2 ml 33.78 ml/m   AORTA Ao Root diam: 3.40 cm MITRAL VALVE               TRICUSPID VALVE MV Area (PHT): 4.10 cm    TR Peak grad:   29.4 mmHg MV Decel Time: 185 msec    TR Vmax:        271.00 cm/s MV E velocity: 87.30 cm/s MV A velocity: 65.00 cm/s  SHUNTS MV E/A ratio:  1.34        Systemic Diam: 2.00 cm Rozann Lesches MD Electronically signed by Rozann Lesches MD Signature Date/Time: 08/16/2019/11:45:40 AM    Final      CBC Recent Labs  Lab 08/15/19 1955 08/16/19 0557 08/18/19 0526  WBC 9.4 8.7 6.9  HGB 13.8  13.7 13.7  HCT 42.7 42.9 42.0  PLT 157 129* 147*  MCV 99.3 99.1 97.2  MCH 32.1 31.6 31.7  MCHC 32.3 31.9 32.6  RDW 15.8* 15.9* 15.2  LYMPHSABS 1.5 2.0  --   MONOABS 0.8 0.8  --   EOSABS 0.0 0.0  --   BASOSABS 0.0 0.0  --     Chemistries  Recent Labs  Lab 08/15/19 1955 08/15/19 1955 08/16/19 0557 08/16/19 0557 08/17/19 0548 08/18/19 0526 08/19/19 0438 08/20/19 0617 08/21/19 0630  NA 131*   < > 136   < > 138 133* 133* 129* 131*  K 5.0   < > 3.6   < > 4.0 3.6 3.5 3.9 4.0  CL 100   < > 101   < > 101 98 98 97* 97*  CO2 22   < > 23   < > 27 21* 24 22 25   GLUCOSE 415*   < > 238*   < > 55* 155* 85 260* 161*  BUN 18   < > 16   < > 21 29* 32* 36* 31*  CREATININE 1.11   < > 0.85   < > 0.94 1.10 1.11 1.07 0.98  CALCIUM 8.7*   < > 8.8*   < > 8.5* 8.3* 8.3* 8.1* 8.4*  MG  --   --  1.7  --   --   --  1.9  --  2.0  AST 45*  --  46*  --   --   --   --   --  68*  ALT 69*  --  65*  --   --   --   --   --  100*  ALKPHOS 422*  --  432*  --   --   --   --   --  593*  BILITOT 1.9*  --  1.6*  --   --   --   --   --  1.1   < > = values in this interval not displayed.   ----------------------------------------------------------------------------------------------------------------- No results for input(s): CHOL, HDL, LDLCALC, TRIG, CHOLHDL, LDLDIRECT in the last 72 hours.  Lab Results  Component Value Date   HGBA1C >18.5 (H) 08/01/2019   ----------------------------------------------------------------------------------------------------------------- No results for input(s): TSH, T4TOTAL, T3FREE, THYROIDAB in the last 72 hours.  Invalid input(s): FREET3 ------------------------------------------------------------------------------------------------------------------ No results for input(s): VITAMINB12, FOLATE, FERRITIN, TIBC, IRON, RETICCTPCT in the last 72 hours.  Coagulation profile No results for input(s): INR, PROTIME in the last 168 hours.  No results for input(s): DDIMER in the  last 72 hours.  Cardiac Enzymes No results for input(s): CKMB, TROPONINI, MYOGLOBIN in the last 168 hours.  Invalid input(s): CK ------------------------------------------------------------------------------------------------------------------    Component Value Date/Time   BNP 1,165.0 (H) 08/15/2019 1955    Kathie Dike M.D on 08/21/2019 at 6:23 PM  Go to [email or web address] - for contact info  [facility] - Office  [phone]

## 2019-08-22 ENCOUNTER — Telehealth: Payer: Self-pay

## 2019-08-22 ENCOUNTER — Inpatient Hospital Stay (HOSPITAL_COMMUNITY): Payer: PPO

## 2019-08-22 DIAGNOSIS — E1165 Type 2 diabetes mellitus with hyperglycemia: Secondary | ICD-10-CM

## 2019-08-22 LAB — CBC
HCT: 40.6 % (ref 39.0–52.0)
Hemoglobin: 13.2 g/dL (ref 13.0–17.0)
MCH: 32 pg (ref 26.0–34.0)
MCHC: 32.5 g/dL (ref 30.0–36.0)
MCV: 98.3 fL (ref 80.0–100.0)
Platelets: 142 10*3/uL — ABNORMAL LOW (ref 150–400)
RBC: 4.13 MIL/uL — ABNORMAL LOW (ref 4.22–5.81)
RDW: 15.2 % (ref 11.5–15.5)
WBC: 6 10*3/uL (ref 4.0–10.5)
nRBC: 0 % (ref 0.0–0.2)

## 2019-08-22 LAB — GLUCOSE, CAPILLARY
Glucose-Capillary: 132 mg/dL — ABNORMAL HIGH (ref 70–99)
Glucose-Capillary: 192 mg/dL — ABNORMAL HIGH (ref 70–99)
Glucose-Capillary: 161 mg/dL — ABNORMAL HIGH (ref 70–99)
Glucose-Capillary: 360 mg/dL — ABNORMAL HIGH (ref 70–99)

## 2019-08-22 LAB — COMPREHENSIVE METABOLIC PANEL
ALT: 88 U/L — ABNORMAL HIGH (ref 0–44)
AST: 69 U/L — ABNORMAL HIGH (ref 15–41)
Albumin: 2.7 g/dL — ABNORMAL LOW (ref 3.5–5.0)
Alkaline Phosphatase: 568 U/L — ABNORMAL HIGH (ref 38–126)
Anion gap: 9 (ref 5–15)
BUN: 28 mg/dL — ABNORMAL HIGH (ref 8–23)
CO2: 26 mmol/L (ref 22–32)
Calcium: 8.4 mg/dL — ABNORMAL LOW (ref 8.9–10.3)
Chloride: 97 mmol/L — ABNORMAL LOW (ref 98–111)
Creatinine, Ser: 0.96 mg/dL (ref 0.61–1.24)
GFR calc Af Amer: >60 mL/min (ref >60)
GFR calc non Af Amer: >60 mL/min (ref >60)
Glucose, Bld: 165 mg/dL — ABNORMAL HIGH (ref 70–99)
Potassium: 4.3 mmol/L (ref 3.5–5.1)
Sodium: 132 mmol/L — ABNORMAL LOW (ref 135–145)
Total Bilirubin: 1.2 mg/dL (ref 0.3–1.2)
Total Protein: 5.6 g/dL — ABNORMAL LOW (ref 6.5–8.1)

## 2019-08-22 MED ORDER — DIGOXIN 125 MCG PO TABS
0.1250 mg | ORAL_TABLET | Freq: Every day | ORAL | Status: DC
  Administered 2019-08-22 – 2019-08-23 (×2): 0.125 mg via ORAL
  Filled 2019-08-22 (×2): qty 1

## 2019-08-22 NOTE — Care Management Important Message (Signed)
Important Message  Patient Details  Name: Stanley Levy MRN: [id number] Date of Birth: 1956-02-20   Medicare Important Message Given:  Yes     Corey Harold 08/22/2019, 1:42 PM

## 2019-08-22 NOTE — Progress Notes (Signed)
Daily Progress Note   Patient Name: Stanley Levy       Date: 08/22/19 DOB: 04/14/1956  Age: 64 y.o. MRN#: [id number] Attending Physician: Shon Hale, MD Primary Care Physician: Freddy Finner, NP Admit Date: 08/15/2019  Reason for Consultation/Follow-up: Establishing goals of care  Subjective/GOC:  Patient awake, alert, oriented, and in good spirits this afternoon. He is eager for discharge home. We discussed possibly tomorrow per Dr. Mariea Clonts.  Discussed course of hospitalization including diagnoses, interventions, plan of care. He does acknowledge understanding that his heart is very weak "20%" but remains hopeful to "live as long as I can" and "5-10 more years."  Discussed challenges with managing heart condition with medications with soft blood pressures. Discussed importance with staying compliant with medications and following up with cardiology outpatient.   Discussed plan to discharge home with home health and outpatient palliative referral.   Length of Stay: 7  Current Medications: Scheduled Meds:  . apixaban  10 mg Oral BID   Followed by  . [START ON 08/23/2019] apixaban  5 mg Oral BID  . digoxin  0.125 mg Oral Daily  . furosemide  20 mg Intravenous Daily  . gabapentin  100 mg Oral TID  . imipramine  50 mg Oral QHS  . insulin aspart  0-15 Units Subcutaneous TID WC  . insulin aspart  0-5 Units Subcutaneous QHS  . insulin detemir  5 Units Subcutaneous QHS  . levothyroxine  37.5 mcg Oral QAC breakfast  . polyethylene glycol  17 g Oral BID  . rosuvastatin  5 mg Oral q1800  . sodium chloride flush  3 mL Intravenous Q12H  . spironolactone  12.5 mg Oral Daily  . tamsulosin  0.4 mg Oral QPC supper    Continuous Infusions: . sodium chloride 250 mL (08/19/19 0701)     PRN Meds: sodium chloride, acetaminophen, ondansetron (ZOFRAN) IV, sodium chloride flush  Physical Exam Vitals and nursing note reviewed.  Constitutional:      General: He is awake.  Pulmonary:     Effort: No tachypnea, accessory muscle usage or respiratory distress.  Skin:    General: Skin is warm and dry.  Neurological:     Mental Status: He is alert and oriented to person, place, and time.  Vital Signs: BP (!) 132/99 (BP Location: Right Arm)   Pulse 93   Temp 98.7 F (37.1 C) (Oral)   Resp 20   Wt 101.3 kg   SpO2 95%   BMI 32.98 kg/m  SpO2: SpO2: 95 % O2 Device: O2 Device: Room Air O2 Flow Rate:    Intake/output summary:   Intake/Output Summary (Last 24 hours) at 08/22/2019 1421 Last data filed at 08/22/2019 1044 Gross per 24 hour  Intake 483 ml  Output 1100 ml  Net -617 ml   LBM: Last BM Date: 08/21/19 Baseline Weight: Weight: 95.1 kg Most recent weight: Weight: 101.3 kg       Palliative Assessment/Data: PPS 50%      Patient Active Problem List   Diagnosis Date Noted  . Goals of care, counseling/discussion   . Advanced care planning/counseling discussion   . Palliative care by specialist   . Hypothyroidism 08/18/2019  . Cognitive changes/memory Challenges 08/18/2019  . LV (left ventricular) mural thrombus   . Acute systolic heart failure (HCC)   . Severe left ventricular systolic dysfunction   . Hyperlipidemia LDL goal <70   . Memory loss 08/16/2019  . Falls frequently 08/16/2019  . Fatigue 08/16/2019  . Essential hypertension 08/16/2019  . Elevated TSH 08/16/2019  . Encounter for hepatitis C screening test for low risk patient 08/16/2019  . Encounter for screening for malignant neoplasm of colon 08/16/2019  . Poorly controlled type 2 diabetes mellitus (HCC) 08/16/2019  . 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 CHF (congestive heart failure) (HCC) 08/15/2019  . Encounter for medication management 08/12/2019  . Hyperglycemia due to diabetes mellitus (HCC) 08/01/2019  . Weakness 08/01/2019  . Subacute confusional state 08/01/2019  . Anxiety disorder 11/25/2011    Palliative Care Assessment & Plan   Patient Profile: 64 y.o. male  with past medical history of DM2, hypothyroid, HLD, anxiety admitted on 08/15/2019 with shortness of breath and edema of the legs. Workup has revealed poorly controlled DM with A1C >18, and new diagnosis of CHF and LV thrombus. ECHO indicates 20%EF with diffuse hypokinesis. Palliative medicine consulted for goals of care.    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 Enforced compliance with medications.  Outpatient cardiology follow-up.  Plan is for home with home health. [facility] outpatient palliative referral placed for ongoing GOC discussions.   Code Status: DNR   Code Status Orders  (From admission, onward)         Start     Ordered   08/19/19 1405  Do not attempt resuscitation (DNR)  Continuous    Question Answer Comment  In the event of cardiac or respiratory ARREST Do not call a "code blue"   In the event of cardiac or respiratory ARREST Do not perform Intubation, CPR, defibrillation or ACLS   In the event of cardiac or respiratory ARREST Use medication by any route, position, wound care, and other measures to relive pain and suffering. May use oxygen, suction and manual treatment of airway obstruction as needed for comfort.      08/19/19 1404        Code Status History    Date Active Date Inactive Code Status Order ID Comments User Context   08/16/2019 0248 08/19/2019 1404 Full  Code [id number]  Rolla Plate, DO Inpatient   08/01/2019 2351 08/02/2019 2234  Full Code [id number]  Vashti Hey, MD Inpatient   Advance Care Planning Activity       Prognosis:  Guarded long-term prognosis.  Discharge Planning:  Home with [place] was discussed with patient, Dr. Denton Brick  Thank you for allowing the Palliative Medicine Team to assist in the care of this patient.   Time In: 1330- Time Out: 1400 Total Time 30 Prolonged Time Billed  no      Greater than 50%  of this time was spent counseling and coordinating care related to the above assessment and plan.  Ihor Dow, DNP, FNP-C Palliative Medicine Team  Phone: [phone] Fax: [phone]  Please contact Palliative Medicine Team phone at [phone] for questions and concerns.

## 2019-08-22 NOTE — Progress Notes (Signed)
    Dr. Purvis Sheffield reviewed with the admitting team and no new Cardiology recommendations at this time. Palliative Care has been following and the plan at this time is to proceed with conservative management with consideration of possible heart catheterization in the future.  Plan to discharge home with outpatient Palliative Care. Will switch IV Digoxin to PO dosing.  Continue Spironolactone 12.5 mg daily and Lasix (currently IV 20mg  daily so would switch to PO). BB stopped given concern for low-output heart failure and BP did not allow for ACE-I. Would consider adding this back to switching to ARB as an outpatient if BP allows. Continue Eliquis for LV mural thrombus. Will arrange for outpatient follow-up.   [facility] will sign off.   Medication Recommendations: As above Other recommendations (labs, testing, etc): BMET and Dig Level in 2 weeks Follow up as an outpatient: Will arrange and include in AVS.    Signed, , PA-C 08/22/2019, 9:43 AM Pager: [phone]

## 2019-08-22 NOTE — Progress Notes (Signed)
Inpatient Diabetes Program Recommendations  AACE/ADA: New Consensus Statement on Inpatient Glycemic Control   Target Ranges:  Prepandial:   less than 140 mg/dL      Peak postprandial:   less than 180 mg/dL (1-2 hours)      Critically ill patients:  140 - 180 mg/dL   Results for NIVAN, MELENDREZ (MRN [id number]) as of 08/22/2019 09:42  Ref. Range 08/21/2019 07:38 08/21/2019 11:28 08/21/2019 15:35 08/21/2019 21:04 08/22/2019 07:34  Glucose-Capillary Latest Ref Range: 70 - 99 mg/dL 209 (H) 198 (H) 022 (H) 301 (H) 132 (H)   Review of Glycemic Control  Diabetes history:DM2 Outpatient Diabetes medications:70/30 25 units BID Current orders for Inpatient glycemic control:Levemir 5 units QHS, Novolog 0-15 units TID with meals, Novolog 0-5 units QHS  Inpatient Diabetes Program Recommendations:    Insulin-Meal Coverage: Post prandial glucose is consistently elevated. Please consider ordering Novolog 4 units TID with meals for meal coverage if patient eats at least 50% of meals.  Thanks, Orlando Penner, RN, MSN, CDE Diabetes Coordinator Inpatient Diabetes Program [phone] (Team Pager from 8am to 5pm)

## 2019-08-22 NOTE — Telephone Encounter (Signed)
[name] is calling just to give a FYI, that Pallative Care was requested for the patient when he is released from the hospital.

## 2019-08-22 NOTE — Progress Notes (Signed)
Patient Demographics:    Stanley Levy, is a 64 y.o. male, DOB - 1955-09-14, CBS:[id number]  Admit date - 08/15/2019   Admitting Physician Rolla Plate, DO  Outpatient Primary MD for the patient is Perlie Mayo, NP  LOS - 7   Chief Complaint  Patient presents with  . Shortness of Breath        Subjective:    Stanley Levy no nausea or vomitting.  Voiding okay No significant shortness of breath at rest dyspnea with activity persist   Assessment  & Plan :    Principal Problem:   Intracardiac thrombosis Active Problems:   Poorly controlled type 2 diabetes mellitus (HCC)   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 %   Hypothyroidism   Falls frequently   Essential hypertension   Cognitive changes/memory Challenges   LV (left ventricular) mural thrombus   Acute systolic heart failure (HCC)   Severe left ventricular systolic dysfunction   Hyperlipidemia LDL goal <70   Goals of care, counseling/discussion   Advanced care planning/counseling discussion   Palliative care by specialist   Echo 08/16/2019 1)Left ventricular ejection fraction, by estimation, is 20%. The left ventricle has severely decreased function. The left ventricle demonstrates global hypokinesis with regional variation.  2. Large LV Mural Thrombus in Distal anterolateral position,  approximately 2.0 x 2.5 cm (trabecular muscle obscures extent to some degree).   Brief Summary:- 64 y.o.male,w/hx of hypothyroidism, HLD, uncontrolled DMII, and anxiety Admitted on 08/15/2017 with dyspnea and found to have new onset cardiomyopathy/CHF with EF of 20% and large LV mural thrombus  A/p 1)HFrEF--- EF 20%--patient with acute exacerbation of combined systolic and diastolic dysfunction CHF -Blood pressure has been soft making it difficult to treat CHF chest  x-ray   with pulmonary venous congestion/CHF and pleural effusion --Continue Lasix , c/n  digoxin 0.125 mg daily c/n Aldactone 12.5 mg daily -Hold lisinopril and discontinue Coreg due to BP concerns -Patient continues to have evidence of volume overload -Use Ace wraps for lower extremities and keep lower extremities elevated -EF 20%, Intracardiac Thrombus and soft BP makes medication management rather challenging -Cardiology consult appreciated -We will consider transfer to [facility] for advanced heart failure team management including LHC/RHC after palliative care has had further opportunities to delineate goals of care with patient -Patient has previously not been compliant  2)Intracardiac Clot/Large LV thrombus--- Eliquis per pharmacy  3)H/o Prior CVA----  MRI Brain from 08/02/19--- with Chronic Lt Frontal Lobe encephalomalacia (remote infarct)-- -okay to stop aspirin C/n  Crestor and Eliquis as ordered -Patient with some mild memory/cognitive concerns  4)DM2- A1C is 18.5,  reflecting uncontrolled DM, Lantus insulin as ordered,  -Use Novolog/Humalog Sliding scale insulin with Accu-Cheks/Fingersticks as ordered   5)Hypothyroidism--- TSH is 15.9, increased levothyroxine to 37.5 mcg from 25 , will need repeat TSH in 3 to 4 weeks  6)Social/Ethics--- plan of care discussed with patient and wife, patient is DNR, 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 No abdominal pain. abd ultrasound with small volume ascites, right-sided  pleural effusion likely cardiogenic, and gallbladder wall edema without definite cholecystitis -Patient is asymptomatic, will not pursue HIDA scan, patient not a candidate for elective surgical procedures given tenuous cardiovascular status anyway  Disposition/Need for in-Hospital Stay- patient unable to be discharged at this time due to --- significant volume overload requiring IV diuresis-- EF 20%, Intracardiac Thrombus and soft BP makes medication management rather challenging -Remains volume overloaded, not medically ready for discharge home with home health at this time  Code Status : DNR  Family Communication:   Discussed with patient  Consults  : Cardiology and palliative care  DVT Prophylaxis  : Eliquis- SCDs   Lab Results  Component Value Date   PLT 142 (L) 08/22/2019   Inpatient Medications  Scheduled Meds: . apixaban  10 mg Oral BID   Followed by  . [START ON 08/23/2019] apixaban  5 mg Oral BID  . digoxin  0.125 mg Oral Daily  . furosemide  20 mg Intravenous Daily  . gabapentin  100 mg Oral TID  . imipramine  50 mg Oral QHS  . insulin aspart  0-15 Units Subcutaneous TID WC  . insulin aspart  0-5 Units Subcutaneous QHS  . insulin detemir  5 Units Subcutaneous QHS  . levothyroxine  37.5 mcg Oral QAC breakfast  . polyethylene glycol  17 g Oral BID  . rosuvastatin  5 mg Oral q1800  . sodium chloride flush  3 mL Intravenous Q12H  . spironolactone  12.5 mg Oral Daily  . tamsulosin  0.4 mg Oral QPC supper   Continuous Infusions: . sodium chloride 250 mL (08/19/19 0701)   PRN Meds:.sodium chloride, acetaminophen, ondansetron (ZOFRAN) IV, sodium chloride flush    Anti-infectives (From admission, onward)   None        Objective:   Vitals:   08/21/19 1410 08/21/19 2103 08/22/19 0441 08/22/19 1454  BP:  125/75 (!) 132/99 113/76  Pulse:  (!) 102 93 94  Resp:  _0 Temp:  97.8 F (36.6 C) 98.7 F (37.1 C)   TempSrc:  Oral Oral   SpO2: 100% 93% 95% 100%    Weight:   101.3 kg     Wt Readings from Last 3 Encounters:  08/22/19 101.3 kg  08/12/19 93 kg  08/11/19 93 kg     Intake/Output Summary (Last 24 hours) at 08/22/2019 1851 Last data filed at 08/22/2019 1044 Gross per 24 hour  Intake 3 ml  Output 250 ml  Net -247 ml     Physical Exam  General exam: Alert, awake, oriented x 3 Respiratory system: crackles at bases, dyspnea on exertion persist  cardiovascular system:RRR. No murmurs, rubs, gallops. Gastrointestinal system: Abdomen is nondistended, soft and nontender.   Normal bowel sounds heard. Central nervous system: Alert and oriented. No focal neurological deficits. Extremities: 1+ edema bilaterally Skin: No rashes, lesions or ulcers Psychiatry: Judgement and insight appear normal. Mood & affect appropriate.      Data Review:   Micro Results Recent Results (from the past 240 hour(s))  Respiratory Panel by RT PCR (Flu A&B, Covid) - Nasopharyngeal Swab     Status: None   Collection Time: 08/15/19  9:52 PM   Specimen: Nasopharyngeal Swab  Result  Value Ref Range Status   SARS Coronavirus 2 by RT PCR NEGATIVE NEGATIVE Final    Comment: (NOTE) SARS-CoV-2 target nucleic acids are NOT DETECTED. The SARS-CoV-2 RNA is generally detectable in upper respiratoy specimens during the acute phase of infection. The lowest concentration of SARS-CoV-2 viral copies this assay can detect is 131 copies/mL. A negative result does not preclude SARS-Cov-2 infection and should not be used as the sole basis for treatment or other patient management decisions. A negative result may occur with  improper specimen collection/handling, submission of specimen other than nasopharyngeal swab, presence of viral mutation(s) within the areas targeted by this assay, and inadequate number of viral copies (<131 copies/mL). A negative result must be combined with clinical observations, patient history, and epidemiological information. The expected result  is Negative. Fact Sheet for Patients:  PinkCheek.be Fact Sheet for Healthcare Providers:  GravelBags.it This test is not yet ap proved or cleared by the [place] FDA and  has been authorized for detection and/or diagnosis of SARS-CoV-2 by FDA under an Emergency Use Authorization (EUA). This EUA will remain  in effect (meaning this test can be used) for the duration of the COVID-19 declaration under Section 564(b)(1) of the Act, 21 U.S.C. section 360bbb-3(b)(1), unless the authorization is terminated or revoked sooner.    Influenza A by PCR NEGATIVE NEGATIVE Final   Influenza B by PCR NEGATIVE NEGATIVE Final    Comment: (NOTE) The Xpert Xpress SARS-CoV-2/FLU/RSV assay is intended as an aid in  the diagnosis of influenza from Nasopharyngeal swab specimens and  should not be used as a sole basis for treatment. Nasal washings and  aspirates are unacceptable for Xpert Xpress SARS-CoV-2/FLU/RSV  testing. Fact Sheet for Patients: PinkCheek.be Fact Sheet for Healthcare Providers: GravelBags.it This test is not yet approved or cleared by the [place] FDA and  has been authorized for detection and/or diagnosis of SARS-CoV-2 by  FDA under an Emergency Use Authorization (EUA). This EUA will remain  in effect (meaning this test can be used) for the duration of the  Covid-19 declaration under Section 564(b)(1) of the Act, 21  U.S.C. section 360bbb-3(b)(1), unless the authorization is  terminated or revoked. Performed at [facility], [address], [place], [place] [address]     Radiology Reports CT HEAD WO CONTRAST  Result Date: 08/01/2019 CLINICAL DATA:  Multiple falls. EXAM: CT HEAD WITHOUT CONTRAST TECHNIQUE: Contiguous axial images were obtained from the base of the skull through the vertex without intravenous contrast. COMPARISON:  None. FINDINGS: Brain: There is  moderate severity cerebral atrophy with widening of the extra-axial spaces and ventricular dilatation. There are areas of decreased attenuation within the white matter tracts of the supratentorial brain, consistent with microvascular disease changes. Adjacent 1 mm and 2 mm foci of white matter calcification are seen along the posterior aspect of the centrum semiovale on the left. Vascular: No hyperdense vessel or unexpected calcification. Skull: Normal. Negative for fracture or focal lesion. Sinuses/Orbits: No acute finding. Other: None. IMPRESSION: 1. Generalized cerebral atrophy. 2. No acute intracranial abnormality. Electronically Signed   By: Virgina Norfolk M.D.   On: 08/01/2019 20:08   CT Angio Chest PE W/Cm &/Or Wo Cm  Result Date: 08/15/2019 CLINICAL DATA:  Short of breath, elevated D dimer EXAM: CT ANGIOGRAPHY CHEST WITH CONTRAST TECHNIQUE: Multidetector CT imaging of the chest was performed using the standard protocol during bolus administration of intravenous contrast. Multiplanar CT image reconstructions and MIPs were obtained to evaluate the vascular anatomy. CONTRAST:  112m OMNIPAQUE IOHEXOL 350 MG/ML SOLN COMPARISON:  08/15/2019 FINDINGS: Cardiovascular: This is a technically adequate evaluation of the pulmonary vasculature. There are no filling defects or pulmonary emboli. Heart is enlarged without pericardial effusion. Thoracic aorta is normal in caliber. Mediastinum/Nodes: Multiple subcentimeter lymph nodes within the mediastinum and hilar nonspecific. No pathologic adenopathy. Lungs/Pleura: There are bilateral pleural effusions volume estimated less than 1 L each. Significant compressive atelectasis within the right lower lobe. Central airways are patent. No pneumothorax. Upper Abdomen: No acute abnormality. Musculoskeletal: No acute or destructive bony lesions. Reconstructed images demonstrate no additional findings. Review of the MIP images confirms the above findings. IMPRESSION: 1. No  evidence of pulmonary embolus. 2. Bilateral pleural effusions with lower lobe compressive atelectasis. 3. Cardiomegaly. Electronically Signed   By: MRanda NgoM.D.   On: 08/15/2019 21:55   MR BRAIN WO CONTRAST  Result Date: 08/02/2019 CLINICAL DATA:  64year old male with multiple falls. Encephalopathy, fatigue. EXAM: MRI HEAD WITHOUT CONTRAST TECHNIQUE: Multiplanar, multiecho pulse sequences of the brain and surrounding structures were obtained without intravenous contrast. COMPARISON:  Head CT yesterday. FINDINGS: Brain: Confluent left superior frontal lobe white matter gliosis and encephalomalacia in continuity with the left lateral ventricle. No restricted diffusion to suggest acute infarction. No midline shift, mass effect, evidence of mass lesion, ventriculomegaly, extra-axial collection or acute intracranial hemorrhage. Cervicomedullary junction and pituitary are within normal limits. Additional Patchy and confluent bilateral cerebral white matter T2 and FLAIR hyperintensity. No cortical encephalomalacia or chronic cerebral blood products identified. The deep gray nuclei, brainstem and cerebellum are within normal limits. Vascular: Major intracranial vascular flow voids are preserved. Skull and upper cervical spine: Negative visible cervical spine. Visualized bone marrow signal is within normal limits. Sinuses/Orbits: Negative orbits. Trace paranasal sinus mucosal thickening. Other: Mastoids are clear. Visible internal auditory structures appear normal. Scalp and face soft tissues appear negative. IMPRESSION: 1. No acute intracranial abnormality. 2. 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 Additional moderate for age cerebral white matter signal changes, most commonly due to chronic small vessel disease. Electronically Signed   By: HGenevie AnnM.D.   On: 08/02/2019 07:44   DG Chest Port 1 View  Result Date: 08/19/2019 CLINICAL DATA:  Shortness of breath on  exertion. EXAM: PORTABLE CHEST 1 VIEW COMPARISON:  08/15/2019 FINDINGS: Stable enlarged cardiac silhouette and prominent hilar vessels. Clear lungs with stable mildly prominent bilateral upper lung zone vascularity. Small left pleural effusion, mildly increased. No right pleural fluid seen today. Thoracic spine degenerative changes. IMPRESSION: 1. Small left pleural effusion, mildly increased. 2. Stable cardiomegaly and mild pulmonary vascular congestion. Electronically Signed   By: SClaudie ReveringM.D.   On: 08/19/2019 08:35   DG Chest Portable 1 View  Result Date: 08/15/2019 CLINICAL DATA:  Dyspnea. EXAM: PORTABLE CHEST 1 VIEW COMPARISON:  08/01/2019 FINDINGS: The heart size remains enlarged. There is no pneumothorax. There may be a trace right-sided pleural effusion. No acute osseous abnormality. IMPRESSION: No active disease. Electronically Signed   By: CConstance HolsterM.D.   On: 08/15/2019 20:12   DG Chest Portable 1 View  Result Date: 08/01/2019 CLINICAL DATA:  Change in mental status EXAM: PORTABLE CHEST 1 VIEW COMPARISON:  None. FINDINGS: The heart size and mediastinal contours are within normal limits. Both lungs are clear. The visualized skeletal structures are unremarkable. IMPRESSION: No active disease. Electronically Signed   By: BPrudencio PairM.D.   On: 08/01/2019 17:46   ECHOCARDIOGRAM COMPLETE  Result Date: 08/16/2019  ECHOCARDIOGRAM REPORT   Patient Name:   BARCLAY LENNOX Date of Exam: 08/16/2019 Medical Rec #:  [id number]      Height:       69.0 in Accession #:    [id number]     Weight:       209.7 lb Date of Birth:  21-Feb-1956       BSA:          2.11 m Patient Age:    73 years       BP:           108/73 mmHg Patient Gender: M              HR:           90 bpm. Exam Location:  [facility] Procedure: 2D Echo Indications:    Congestive Heart Failure 428.0 / I50.9  History:        Patient has no prior history of Echocardiogram examinations.                 CHF; Risk Factors:Non-Smoker,  Dyslipidemia and Diabetes.                 Hyperglycemia due to diabetes mellitus.  Sonographer:    Leavy Cella RDCS (AE) Referring Phys: [id number] ASIA B [place]  1. Left ventricular ejection fraction, by estimation, is 20%. The left ventricle has severely decreased function. The left ventricle demonstrates global hypokinesis with regional variation.  2. Large LV mural thrombus in distal anterolateral position, approximately 2.0 x 2.5 cm (trabecular muscle obscures extent to some degree). Reported to Dr. Denton Brick.  3. Right ventricular systolic function is moderately reduced. The right ventricular size is normal. There is mildly elevated pulmonary artery systolic pressure. The estimated right ventricular systolic pressure is 55.3 mmHg.  4. Left atrial size was mildly dilated.  5. Right atrial size was moderately dilated.  6. The mitral valve is grossly normal. Trivial mitral valve regurgitation.  7. The aortic valve is tricuspid. Aortic valve regurgitation is not visualized.  8. The inferior vena cava is normal in size with greater than 50% respiratory variability, suggesting right atrial pressure of 3 mmHg. FINDINGS  Left Ventricle: Left ventricular ejection fraction, by estimation, is 20%. The left ventricle has severely decreased function. The left ventricle demonstrates global hypokinesis. The left ventricular internal cavity size was normal in size. There is no left ventricular hypertrophy. Left ventricular diastolic parameters are consistent with Grade II diastolic dysfunction (pseudonormalization). Right Ventricle: The right ventricular size is normal. No increase in right ventricular wall thickness. Right ventricular systolic function is moderately reduced. There is mildly elevated pulmonary artery systolic pressure. The tricuspid regurgitant velocity is 2.71 m/s, and with an assumed right atrial pressure of 3 mmHg, the estimated right ventricular systolic pressure is 74.8 mmHg. Left  Atrium: Left atrial size was mildly dilated. Right Atrium: Right atrial size was moderately dilated. Pericardium: Trivial pericardial effusion is present. The pericardial effusion is posterior to the left ventricle. Mitral Valve: The mitral valve is grossly normal. Trivial mitral valve regurgitation. Tricuspid Valve: The tricuspid valve is grossly normal. Tricuspid valve regurgitation is mild. Aortic Valve: The aortic valve is tricuspid. Aortic valve regurgitation is not visualized. Pulmonic Valve: The pulmonic valve was grossly normal. Pulmonic valve regurgitation is not visualized. Aorta: The aortic root is normal in size and structure. Venous: The inferior vena cava is normal in size with greater than 50% respiratory variability, suggesting right atrial pressure of 3 mmHg. IAS/Shunts:  No atrial level shunt detected by color flow Doppler.  LEFT VENTRICLE PLAX 2D LVIDd:         5.32 cm  Diastology LVIDs:         5.07 cm  LV e' lateral:   8.05 cm/s LV PW:         1.09 cm  LV E/e' lateral: 10.8 LV IVS:        0.86 cm  LV e' medial:    5.98 cm/s LVOT diam:     2.00 cm  LV E/e' medial:  14.6 LV SV Index:   6.61 LVOT Area:     3.14 cm  RIGHT VENTRICLE RV S prime:     9.36 cm/s TAPSE (M-mode): 1.8 cm LEFT ATRIUM             Index       RIGHT ATRIUM           Index LA diam:        3.70 cm 1.76 cm/m  RA Area:     28.20 cm LA Vol (A2C):   83.1 ml 39.42 ml/m RA Volume:   106.00 ml 50.29 ml/m LA Vol (A4C):   56.9 ml 26.99 ml/m LA Biplane Vol: 71.2 ml 33.78 ml/m   AORTA Ao Root diam: 3.40 cm MITRAL VALVE               TRICUSPID VALVE MV Area (PHT): 4.10 cm    TR Peak grad:   29.4 mmHg MV Decel Time: 185 msec    TR Vmax:        271.00 cm/s MV E velocity: 87.30 cm/s MV A velocity: 65.00 cm/s  SHUNTS MV E/A ratio:  1.34        Systemic Diam: 2.00 cm Rozann Lesches MD Electronically signed by Rozann Lesches MD Signature Date/Time: 08/16/2019/11:45:40 AM    Final    US Abdomen Limited RUQ  Result Date:  08/22/2019 CLINICAL DATA:  Increased LFTs EXAM: ULTRASOUND ABDOMEN LIMITED RIGHT UPPER QUADRANT COMPARISON:  CT chest of 08/15/2019 FINDINGS: Gallbladder: Signs of gallbladder wall thickening without reported sonographic Murphy's sign. Edema is present in the wall the gallbladder, thickness approximately 5 to 6 mm. No pericholecystic fluid. Common bile duct: Diameter: 3.6 mm Liver: Mildly lobular and question of nodular liver contours. Trace perihepatic fluid. Portal vein is patent on color Doppler imaging with normal direction of blood flow towards the liver. Other: Right-sided pleural seen as on the recent CT of the chest. IMPRESSION: 1. Gallbladder wall edema, nonspecific finding in the setting of heart failure. If there is clinical concern for acute gallbladder pathology HIDA scan could potentially be helpful. 2. Small volume ascites and right-sided pleural effusion likely cardiogenic. 3. Nodular hepatic contours are suggested which could be related to congestive hepatopathy. Electronically Signed   By: Zetta Bills M.D.   On: 08/22/2019 08:42     CBC Recent Labs  Lab 08/15/19 1955 08/16/19 0557 08/18/19 0526 08/22/19 0519  WBC 9.4 8.7 6.9 6.0  HGB 13.8 13.7 13.7 13.2  HCT 42.7 42.9 42.0 40.6  PLT 157 129* 147* 142*  MCV 99.3 99.1 97.2 98.3  MCH 32.1 31.6 31.7 32.0  MCHC 32.3 31.9 32.6 32.5  RDW 15.8* 15.9* 15.2 15.2  LYMPHSABS 1.5 2.0  --   --   MONOABS 0.8 0.8  --   --   EOSABS 0.0 0.0  --   --   BASOSABS 0.0 0.0  --   --  Chemistries  Recent Labs  Lab 08/15/19 1955 08/15/19 1955 08/16/19 0557 08/17/19 0548 08/18/19 0526 08/19/19 0438 08/20/19 0617 08/21/19 0630 08/22/19 0519  NA 131*   < > 136   < > 133* 133* 129* 131* 132*  K 5.0   < > 3.6   < > 3.6 3.5 3.9 4.0 4.3  CL 100   < > 101   < > 98 98 97* 97* 97*  CO2 22   < > 23   < > 21* _0 GLUCOSE 415*   < > 238*   < > 155* 85 260* 161* 165*  BUN 18   < > 16   < > 29* 32* 36* 31* 28*  CREATININE 1.11   < >  0.85   < > 1.10 1.11 1.07 0.98 0.96  CALCIUM 8.7*   < > 8.8*   < > 8.3* 8.3* 8.1* 8.4* 8.4*  MG  --   --  1.7  --   --  1.9  --  2.0  --   AST 45*  --  46*  --   --   --   --  68* 69*  ALT 69*  --  65*  --   --   --   --  100* 88*  ALKPHOS 422*  --  432*  --   --   --   --  593* 568*  BILITOT 1.9*  --  1.6*  --   --   --   --  1.1 1.2   < > = values in this interval not displayed.   ----------------------------------------------------------------------------------------------------------------- No results for input(s): CHOL, HDL, LDLCALC, TRIG, CHOLHDL, LDLDIRECT in the last 72 hours.  Lab Results  Component Value Date   HGBA1C >18.5 (H) 08/01/2019   ----------------------------------------------------------------------------------------------------------------- No results for input(s): TSH, T4TOTAL, T3FREE, THYROIDAB in the last 72 hours.  Invalid input(s): FREET3 ------------------------------------------------------------------------------------------------------------------ No results for input(s): VITAMINB12, FOLATE, FERRITIN, TIBC, IRON, RETICCTPCT in the last 72 hours.  Coagulation profile No results for input(s): INR, PROTIME in the last 168 hours.  No results for input(s): DDIMER in the last 72 hours.  Cardiac Enzymes No results for input(s): CKMB, TROPONINI, MYOGLOBIN in the last 168 hours.  Invalid input(s): CK ------------------------------------------------------------------------------------------------------------------    Component Value Date/Time   BNP 1,165.0 (H) 08/15/2019 1955    Roxan Hockey M.D on 08/22/2019 at 6:51 PM  Go to [email or web address] - for contact info  [facility] - Office  [phone]

## 2019-08-22 NOTE — Telephone Encounter (Signed)
Please call Clydie Braun regarding the pt

## 2019-08-22 NOTE — Progress Notes (Signed)
Physical Therapy Treatment Patient Details Name: Stanley Levy MRN: [id number] DOB: August 02, 1955 Today's Date: 08/22/2019    History of Present Illness Williams Dietrick  is a 64 y.o. male, w/hx of thyroid disease, HLD, DMII, and anxiety presents with chief complaint of peripheral edema. Patient is a poor historian, and seems to have little insight into his medical conditions and medications at home. Patient reports that the onset of his symptoms was 24 hours ago. Patient reports that his legs were so swollen that he couldn't walk. He has associated dyspnea. He reports that standing up and exerting himself make both symptoms worse. He denies orthopnea. He denies chest pain, palpitations, and wheezing. Patient reports that he has not had anything like this before.    PT Comments    Patient demonstrates labored movement for sitting up at bedside requiring use of bed rail, had difficulty with sit to stands using SPC, required a couple attempts before able to stand and had to sit down again before attempting gait training.  Patient ambulated in hallway with slightly unsteady cadence, slower than normal with occasional stumbling forward without loss of balance and tolerated sitting up in chair after therapy - NT notified.  Patient will benefit from continued physical therapy in hospital and recommended venue below to increase strength, balance, endurance for safe ADLs and gait.    Follow Up Recommendations  Home health PT;Supervision - Intermittent     Equipment Recommendations  None recommended by PT    Recommendations for Other Services       Precautions / Restrictions Precautions Precautions: Fall Restrictions Weight Bearing Restrictions: No    Mobility  Bed Mobility Overal bed mobility: Modified Independent             General bed mobility comments: increased time, labored movement had to use bed rail  Transfers Overall transfer level: Needs assistance Equipment used: Straight  cane Transfers: Sit to/from Stand;Stand Pivot Transfers Sit to Stand: Supervision;Min guard Stand pivot transfers: Supervision       General transfer comment: had difficulty completing sit to stands due to BLE weakness, labored movement, increased time  Ambulation/Gait Ambulation/Gait assistance: Supervision Gait Distance (Feet): 65 Feet Assistive device: Straight cane Gait Pattern/deviations: Decreased step length - right;Decreased step length - left;Decreased stride length;Wide base of support Gait velocity: decreased   General Gait Details: labored cadence with wider than normal base of support, occasional leaning forward, no loss of balance, limited secondary to c/o fatigue   Stairs             Wheelchair Mobility    Modified Rankin (Stroke Patients Only)       Balance Overall balance assessment: Needs assistance Sitting-balance support: Feet supported;No upper extremity supported Sitting balance-Leahy Scale: Good Sitting balance - Comments: seated EOB   Standing balance support: During functional activity;Single extremity supported Standing balance-Leahy Scale: Fair Standing balance comment: fair/good using SPC                            Cognition Arousal/Alertness: Awake/alert Behavior During Therapy: WFL for tasks assessed/performed Overall Cognitive Status: Within Functional Limits for tasks assessed                                        Exercises General Exercises - Lower Extremity Long Arc Quad: Seated;AROM;Strengthening;Both;10 reps Hip Flexion/Marching: Seated;AROM;Strengthening;Both;10 reps Toe Raises: Seated;AROM;Strengthening;Both;10 reps  Heel Raises: Seated;AROM;Strengthening;Both;10 reps    General Comments        Pertinent Vitals/Pain Pain Assessment: Faces Faces Pain Scale: Hurts little more Pain Location: bilateral feet/lower legs Pain Descriptors / Indicators: Grimacing;Discomfort;Squeezing Pain  Intervention(s): Limited activity within patient's tolerance;Monitored during session;Repositioned    Home Living                      Prior Function            PT Goals (current goals can now be found in the care plan section) Acute Rehab PT Goals Patient Stated Goal: return home with family to assist PT Goal Formulation: With patient Time For Goal Achievement: 08/26/19 Potential to Achieve Goals: Good Progress towards PT goals: Progressing toward goals    Frequency    Min 3X/week      PT Plan Current plan remains appropriate    Co-evaluation              AM-PAC PT "6 Clicks" Mobility   Outcome Measure  Help needed turning from your back to your side while in a flat bed without using bedrails?: None Help needed moving from lying on your back to sitting on the side of a flat bed without using bedrails?: A Little(had to use bed rail) Help needed moving to and from a bed to a chair (including a wheelchair)?: A Little Help needed standing up from a chair using your arms (e.g., wheelchair or bedside chair)?: A Little Help needed to walk in hospital room?: A Little Help needed climbing 3-5 steps with a railing? : A Little 6 Click Score: 19    End of Session Equipment Utilized During Treatment: Gait belt Activity Tolerance: Patient tolerated treatment well;Patient limited by fatigue Patient left: in chair;with call bell/phone within reach Nurse Communication: Mobility status PT Visit Diagnosis: Unsteadiness on feet (R26.81);Other abnormalities of gait and mobility (R26.89);Muscle weakness (generalized) (M62.81)     Time: 1700-1749 PT Time Calculation (min) (ACUTE ONLY): 28 min  Charges:  $Gait Training: 8-22 mins $Therapeutic Exercise: 8-22 mins                     3:33 PM, 08/22/19 Stanley Levy, MPT Physical Therapist with [facility] 336 [phone] office [phone] mobile phone

## 2019-08-22 NOTE — Telephone Encounter (Signed)
Spoke with Clydie Braun and verified patient is our patient.

## 2019-08-23 LAB — COMPREHENSIVE METABOLIC PANEL
ALT: 99 U/L — ABNORMAL HIGH (ref 0–44)
AST: 94 U/L — ABNORMAL HIGH (ref 15–41)
Albumin: 2.8 g/dL — ABNORMAL LOW (ref 3.5–5.0)
Alkaline Phosphatase: 653 U/L — ABNORMAL HIGH (ref 38–126)
Anion gap: 10 (ref 5–15)
BUN: 26 mg/dL — ABNORMAL HIGH (ref 8–23)
CO2: 24 mmol/L (ref 22–32)
Calcium: 8.4 mg/dL — ABNORMAL LOW (ref 8.9–10.3)
Chloride: 97 mmol/L — ABNORMAL LOW (ref 98–111)
Creatinine, Ser: 1.04 mg/dL (ref 0.61–1.24)
GFR calc Af Amer: >60 mL/min (ref >60)
GFR calc non Af Amer: >60 mL/min (ref >60)
Glucose, Bld: 247 mg/dL — ABNORMAL HIGH (ref 70–99)
Potassium: 4.4 mmol/L (ref 3.5–5.1)
Sodium: 131 mmol/L — ABNORMAL LOW (ref 135–145)
Total Bilirubin: 1.2 mg/dL (ref 0.3–1.2)
Total Protein: 5.9 g/dL — ABNORMAL LOW (ref 6.5–8.1)

## 2019-08-23 LAB — CBC
HCT: 40.4 % (ref 39.0–52.0)
Hemoglobin: 12.8 g/dL — ABNORMAL LOW (ref 13.0–17.0)
MCH: 31.4 pg (ref 26.0–34.0)
MCHC: 31.7 g/dL (ref 30.0–36.0)
MCV: 99 fL (ref 80.0–100.0)
Platelets: 144 10*3/uL — ABNORMAL LOW (ref 150–400)
RBC: 4.08 MIL/uL — ABNORMAL LOW (ref 4.22–5.81)
RDW: 15.3 % (ref 11.5–15.5)
WBC: 6.5 10*3/uL (ref 4.0–10.5)
nRBC: 0 % (ref 0.0–0.2)

## 2019-08-23 LAB — GLUCOSE, CAPILLARY
Glucose-Capillary: 141 mg/dL — ABNORMAL HIGH (ref 70–99)
Glucose-Capillary: 225 mg/dL — ABNORMAL HIGH (ref 70–99)
Glucose-Capillary: 319 mg/dL — ABNORMAL HIGH (ref 70–99)

## 2019-08-23 MED ORDER — DIGOXIN 125 MCG PO TABS: 0.1250 mg | ORAL_TABLET | Freq: Every day | ORAL | 3 refills | Status: DC

## 2019-08-23 MED ORDER — ACETAMINOPHEN 325 MG PO TABS: 650.0000 mg | ORAL_TABLET | ORAL | 0 refills | Status: AC | PRN

## 2019-08-23 MED ORDER — FUROSEMIDE 20 MG PO TABS: 20.0000 mg | ORAL_TABLET | Freq: Two times a day (BID) | ORAL | 3 refills | Status: DC

## 2019-08-23 MED ORDER — LISINOPRIL 2.5 MG PO TABS: 2.5000 mg | ORAL_TABLET | Freq: Every day | ORAL | 5 refills | Status: DC

## 2019-08-23 MED ORDER — ROSUVASTATIN CALCIUM 5 MG PO TABS: 5.0000 mg | ORAL_TABLET | Freq: Every evening | ORAL | 3 refills | Status: DC

## 2019-08-23 MED ORDER — TAMSULOSIN HCL 0.4 MG PO CAPS: 0.4000 mg | ORAL_CAPSULE | Freq: Every day | ORAL | 3 refills | Status: DC

## 2019-08-23 MED ORDER — APIXABAN 5 MG PO TABS: 5.0000 mg | ORAL_TABLET | Freq: Two times a day (BID) | ORAL | 3 refills | Status: DC

## 2019-08-23 MED ORDER — SPIRONOLACTONE 25 MG PO TABS: 12.5000 mg | ORAL_TABLET | Freq: Every day | ORAL | 3 refills | Status: DC

## 2019-08-23 MED ORDER — NOVOLOG MIX 70/30 FLEXPEN (70-30) 100 UNIT/ML ~~LOC~~ SUPN: 25.0000 [IU] | PEN_INJECTOR | Freq: Two times a day (BID) | SUBCUTANEOUS | 11 refills | Status: DC

## 2019-08-23 MED ORDER — LEVOTHYROXINE SODIUM 75 MCG PO TABS: 37.5000 ug | ORAL_TABLET | Freq: Every day | ORAL | 3 refills | Status: DC

## 2019-08-23 NOTE — Progress Notes (Signed)
Pt has been up OOB ambulating with walker and standby assist to BR. BM x1. Pt steady on feet once ambulating, uses walker well. Currently, wife at bedside. Pt denies c/o.

## 2019-08-23 NOTE — Discharge Instructions (Signed)
1)Very low-salt diet advised 2)Weigh yourself daily, call if you gain more than 3 pounds in 1 day or more than 5 pounds in 1 week as your diuretic medications may need to be adjusted 3)Limit your Fluid  intake to no more than 60 ounces (1.8 Liters) per day 4)Avoid sugars and sweets, strict diabetic diet advised 5) take medications as prescribed, 6) follow-up with primary care physician within a week for recheck and reevaluation   Information on my medicine - ELIQUIS (apixaban)  This medication education was reviewed with me or my healthcare representative as part of my discharge preparation.  The pharmacist that spoke with me during my hospital stay was:  Tad Moore, [facility]  Why was Eliquis prescribed for you? Eliquis was prescribed to treat blood clots that may have been found in the veins of your legs (deep vein thrombosis) or in your lungs (pulmonary embolism) and to reduce the risk of them occurring again.  What do You need to know about Eliquis ? The starting dose is 10 mg (two 5 mg tablets) taken TWICE daily for the FIRST SEVEN (7) DAYS, then on (enter date)  08/24/2019  the dose is reduced to ONE 5 mg tablet taken TWICE daily.  Eliquis may be taken with or without food.   Try to take the dose about the same time in the morning and in the evening. If you have difficulty swallowing the tablet whole please discuss with your pharmacist how to take the medication safely.  Take Eliquis exactly as prescribed and DO NOT stop taking Eliquis without talking to the doctor who prescribed the medication.  Stopping may increase your risk of developing a new blood clot.  Refill your prescription before you run out.  After discharge, you should have regular check-up appointments with your healthcare provider that is prescribing your Eliquis.    What do you do if you miss a dose? If a dose of ELIQUIS is not taken at the scheduled time, take it as soon as possible on the same day and  twice-daily administration should be resumed. The dose should not be doubled to make up for a missed dose.  Important Safety Information A possible side effect of Eliquis is bleeding. You should call your healthcare provider right away if you experience any of the following: ? Bleeding from an injury or your nose that does not stop. ? Unusual colored urine (red or dark brown) or unusual colored stools (red or black). ? Unusual bruising for unknown reasons. ? A serious fall or if you hit your head (even if there is no bleeding).  Some medicines may interact with Eliquis and might increase your risk of bleeding or clotting while on Eliquis. To help avoid this, consult your healthcare provider or pharmacist prior to using any new prescription or non-prescription medications, including herbals, vitamins, non-steroidal anti-inflammatory drugs (NSAIDs) and supplements.  This website has more information on Eliquis (apixaban): http://www.eliquis.com/eliquis/home   1)Very low-salt diet advised 2)Weigh yourself daily, call if you gain more than 3 pounds in 1 day or more than 5 pounds in 1 week as your diuretic medications may need to be adjusted 3)Limit your Fluid  intake to no more than 60 ounces (1.8 Liters) per day 4)Avoid sugars and sweets, strict diabetic diet advised 5) take medications as prescribed, 6) follow-up with primary care physician within a week for recheck and reevaluation

## 2019-08-23 NOTE — Discharge Summary (Signed)
Stanley Levy, is a 64 y.o. male  DOB [birth year]  MRN [id number].  Admission date:  08/15/2019  Admitting Physician  Rolla Plate, DO  Discharge Date:  08/23/2019   Primary MD  Perlie Mayo, NP  Recommendations for primary care physician for things to follow:   1)Very low-salt diet advised 2)Weigh yourself daily, call if you gain more than 3 pounds in 1 day or more than 5 pounds in 1 week as your diuretic medications may need to be adjusted 3)Limit your Fluid  intake to no more than 60 ounces (1.8 Liters) per day 4)Avoid sugars and sweets, strict diabetic diet advised 5) take medications as prescribed, 6) follow-up with primary care physician within a week for recheck and reevaluation  Admission Diagnosis  CHF (congestive heart failure) ([place]) [I50.9] Acute congestive heart failure, unspecified heart failure type ([place]) [P59.4] Systolic congestive heart failure, unspecified HF chronicity ([place]) [I50.20]   Discharge Diagnosis  CHF (congestive heart failure) ([place]) [I50.9] Acute congestive heart failure, unspecified heart failure type ([place]) [V85.9] Systolic congestive heart failure, unspecified HF chronicity ([place]) [I50.20]    Principal Problem:   Intracardiac thrombosis Active Problems:   Poorly controlled type 2 diabetes mellitus ([place])   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 %   Hypothyroidism   Falls frequently   Essential hypertension   Cognitive changes/memory Challenges   LV (left ventricular) mural thrombus   Acute systolic heart failure (HCC)   Severe left ventricular systolic dysfunction   Hyperlipidemia LDL goal <70   Goals of care, counseling/discussion   Advanced care planning/counseling discussion   Palliative care by specialist      Past Medical History:  Diagnosis Date  . Anxiety   . Diabetes  mellitus, type II ([place])   . Hyperlipidemia   . Thyroid disease     Past Surgical History:  Procedure Laterality Date  . FOOT SURGERY       HPI  from the history and physical done on the day of admission:    Stanley Levy  is a 64 y.o. male, w/hx of thyroid disease, HLD, DMII, and anxiety presents with chief complaint of peripheral edema. Patient is a poor historian, and seems to have little insight into his medical conditions and medications at home. Patient reports that the onset of his symptoms was 24 hours ago. Patient reports that his legs were so swollen that he couldn't walk. He has associated dyspnea. He reports that standing up and exerting himself make both symptoms worse. He denies orthopnea. He denies chest pain, palpitations, and wheezing. Patient reports that he has not had anything like this before.   In ED T 98.4, P 101, RR22, BP 132/97  Na 125 BUN/CR 24/1.09 Troponin 23, 21 D-Dimer 1.6 CTA with pleural effusions - no PE Covid negative BNP  >1600   Hospital Course:    Echo 08/16/2019 1)Left ventricular ejection fraction, by estimation, is 20%. The left ventricle has severely decreased function. The left  ventricle demonstrates global hypokinesis with regional variation.  2. Large LVMuralThrombus inDistal anterolateral position,  approximately 2.0 x 2.5 cm (trabecular muscle obscures extent to some degree).   Brief Summary:- 64 y.o.male,w/hx of hypothyroidism, HLD, uncontrolled DMII, and anxiety Admitted on 08/15/2017 with dyspnea and found to have new onset cardiomyopathy/CHF with EF of 20% and large LV mural thrombus  A/p 1)HFrEF--- EF 20%--patient with acute exacerbation of combined systolic and diastolic dysfunction CHF -Blood pressure has been soft making it difficult to treat CHF chest x-ray   with pulmonary venous congestion/CHF and pleural effusion -Overall improved with very careful and gentle diuresis, --Continue Lasix , c/n  digoxin 0.125 mg  daily c/n Aldactone 12.5 mg daily -Continue lisinopril and  --discontinued Coreg due to BP concerns -Use Ace wraps for lower extremities and keep lower extremities elevated -EF 20%, Intracardiac Thrombus and soft BP makes medication management rather challenging -Cardiology consult appreciated -Defer transfer to advanced heart failure team management including LHC/RHC for now as patient is notoriously noncompliant - palliative care consult appreciated   2)Intracardiac Clot/Large LV thrombus---Eliquis per pharmacy  3)H/o Prior CVA---- MRI Brain from 08/02/19--- with Chronic Lt Frontal Lobe encephalomalacia (remote infarct)-- -okay to stop aspirin while on Eliquis C/n  Crestor and Eliquis as ordered -Patient with some mild memory/cognitive concerns  4)DM2- A1C is 18.5, reflecting uncontrolled DM, Lantus insulin as ordered, -Patient is notoriously noncompliant  5)Hypothyroidism---TSH is 15.9, increased levothyroxine to 37.5 mcg from 25 , will need repeat TSH in 3 to 4 weeks  6)Social/Ethics---plan of care discussed with patient and wife, patient is DNR, 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  She was reminded due to HIPAA only patient and  Patient's wife were updated.   7)Generalized weakness/deconditioning--- PT eval appreciated recommends home health PT  8)BPH with LUTs--c/n Flomax if urinary retention persists patient will need Foley to be inserted  9)Elevated alk phos-- No abdominal pain. abd ultrasound with small volume ascites, right-sided pleural effusion likely  cardiogenic, and gallbladder wall edema without definite cholecystitis -Patient is asymptomatic, will not pursue HIDA scan, patient not a candidate for elective surgical procedures given tenuous cardiovascular status anyway  Disposition---- d/c home with HH and Palliative care----stable -Plan of care discussed with patient's wife prior to discharge -Patient sister-in-law apparently got upset that she was not called with discharge planning .  She was reminded due to HIPAA only patient and  Patient's wife were updated.   Code Status : DNR  Family Communication:   Discussed with patient and pt's wife  Consults  : Cardiology and palliative care  DVT Prophylaxis  : Eliquis- SCDs   Discharge Condition:  Stable   Follow UP--  Follow-up Information    [facility] Follow up.   Why:  PT Contact information: [place] [address] [phone]        [facility] Follow up.   Why:  Palliative in Home Contact information: [place] [address] McDade, [place], PA-C Follow up on 09/13/2019.   Specialties: Librarian, academic, Cardiology  Why: Cardiology Hospital Follow-up on 09/13/2019 at 2:30 PM. If needing a different date or time, please contact our office.  Contact information: [place] [address] [phone]        Perlie Mayo, NP. Schedule an appointment as soon as possible for a visit in 1 week(s).   Specialty: Family Medicine Contact information: [place] [address] [phone]        Herminio Commons, MD .   Specialty: Cardiology Contact information: [place] [address] [phone]           Diet and Activity recommendation:  As advised  Discharge Instructions    Discharge Instructions    Amb Referral to Palliative Care   Complete by: As directed    Call MD for:  difficulty breathing, headache or  visual disturbances   Complete by: As directed    Call MD for:  extreme fatigue   Complete by: As directed    Call MD for:  persistant dizziness or light-headedness   Complete by: As directed    Call MD for:  persistant nausea and vomiting   Complete by: As directed    Call MD for:  severe uncontrolled pain   Complete by: As directed    Call MD for:  temperature >100.4   Complete by: As directed    Diet - low sodium heart healthy   Complete by: As directed    Diet Carb Modified   Complete by: As directed    Discharge instructions   Complete by: As directed    1)Very low-salt diet advised 2)Weigh yourself daily, call if you gain more than 3 pounds in 1 day or more than 5 pounds in 1 week as your diuretic medications may need to be adjusted 3)Limit your Fluid  intake to no more than 60 ounces (1.8 Liters) per day 4)Avoid sugars and sweets, strict diabetic diet advised 5) take medications as prescribed, 6) follow-up with primary care physician within a week for recheck and reevaluation   Increase activity slowly   Complete by: As directed       Discharge Medications     Allergies as of 08/23/2019   No Known Allergies     Medication List    TAKE these medications   acetaminophen 325 MG tablet Commonly known as: TYLENOL Take 2 tablets (650 mg total) by mouth every 4 (four) hours as needed for headache or mild pain.   apixaban 5 MG Tabs tablet Commonly known as: ELIQUIS Take 1 tablet (5 mg total) by mouth 2 (two) times daily.   blood glucose meter kit and supplies Dispense based on patient and insurance preference. Use up to four times daily as directed. (FOR ICD-10 E10.9, E11.9).   blood glucose meter kit and supplies Kit Dispense based on patient and insurance preference. Use up to four times daily as directed. (FOR ICD-9 250.00, 250.01).   digoxin 0.125 MG tablet Commonly known as: LANOXIN Take 1 tablet (0.125 mg total) by mouth daily. Start taking on: August 24, 2019   furosemide 20 MG tablet Commonly known as: Lasix Take 1 tablet (20 mg total) by mouth 2 (two) times daily.   gabapentin 100 MG capsule Commonly known as: Neurontin Take 1 capsule (100 mg total) by mouth 3 (three) times daily.   Glucose Meter Test test strip Generic drug: glucose blood Use as instructed   imipramine 50 MG tablet Commonly known as: TOFRANIL Take 1 tablet (  50 mg total) by mouth at bedtime.   Insulin Pen Needle 31G X 5 MM Misc 1 Units by Does not apply route 2 (two) times daily.   levothyroxine 75 MCG tablet Commonly known as: SYNTHROID Take 0.5 tablets (37.5 mcg total) by mouth daily before breakfast. Start taking on: August 24, 2019 What changed:   medication strength  how much to take   lisinopril 2.5 MG tablet Commonly known as: ZESTRIL Take 1 tablet (2.5 mg total) by mouth daily. What changed:   medication strength  how much to take   NovoLOG Mix 70/30 FlexPen (70-30) 100 UNIT/ML FlexPen Generic drug: insulin aspart protamine - aspart Inject 0.25 mLs (25 Units total) into the skin 2 (two) times daily with a meal.   rosuvastatin 5 MG tablet Commonly known as: Crestor Take 1 tablet (5 mg total) by mouth every evening. What changed: when to take this   spironolactone 25 MG tablet Commonly known as: ALDACTONE Take 0.5 tablets (12.5 mg total) by mouth daily. Start taking on: August 24, 2019   tamsulosin 0.4 MG Caps capsule Commonly known as: FLOMAX Take 1 capsule (0.4 mg total) by mouth daily after supper.   Vitamin D (Ergocalciferol) 1.25 MG (50000 UNIT) Caps capsule Commonly known as: DRISDOL Take 1 capsule (50,000 Units total) by mouth every 7 (seven) days.      Major procedures and Radiology Reports - PLEASE review detailed and final reports for all details, in brief -    CT HEAD WO CONTRAST  Result Date: 08/01/2019 CLINICAL DATA:  Multiple falls. EXAM: CT HEAD WITHOUT CONTRAST TECHNIQUE: Contiguous axial images were  obtained from the base of the skull through the vertex without intravenous contrast. COMPARISON:  None. FINDINGS: Brain: There is moderate severity cerebral atrophy with widening of the extra-axial spaces and ventricular dilatation. There are areas of decreased attenuation within the white matter tracts of the supratentorial brain, consistent with microvascular disease changes. Adjacent 1 mm and 2 mm foci of white matter calcification are seen along the posterior aspect of the centrum semiovale on the left. Vascular: No hyperdense vessel or unexpected calcification. Skull: Normal. Negative for fracture or focal lesion. Sinuses/Orbits: No acute finding. Other: None. IMPRESSION: 1. Generalized cerebral atrophy. 2. No acute intracranial abnormality. Electronically Signed   By: Virgina Norfolk M.D.   On: 08/01/2019 20:08   CT Angio Chest PE W/Cm &/Or Wo Cm  Result Date: 08/15/2019 CLINICAL DATA:  Short of breath, elevated D dimer EXAM: CT ANGIOGRAPHY CHEST WITH CONTRAST TECHNIQUE: Multidetector CT imaging of the chest was performed using the standard protocol during bolus administration of intravenous contrast. Multiplanar CT image reconstructions and MIPs were obtained to evaluate the vascular anatomy. CONTRAST:  143m OMNIPAQUE IOHEXOL 350 MG/ML SOLN COMPARISON:  08/15/2019 FINDINGS: Cardiovascular: This is a technically adequate evaluation of the pulmonary vasculature. There are no filling defects or pulmonary emboli. Heart is enlarged without pericardial effusion. Thoracic aorta is normal in caliber. Mediastinum/Nodes: Multiple subcentimeter lymph nodes within the mediastinum and hilar nonspecific. No pathologic adenopathy. Lungs/Pleura: There are bilateral pleural effusions volume estimated less than 1 L each. Significant compressive atelectasis within the right lower lobe. Central airways are patent. No pneumothorax. Upper Abdomen: No acute abnormality. Musculoskeletal: No acute or destructive bony lesions.  Reconstructed images demonstrate no additional findings. Review of the MIP images confirms the above findings. IMPRESSION: 1. No evidence of pulmonary embolus. 2. Bilateral pleural effusions with lower lobe compressive atelectasis. 3. Cardiomegaly. Electronically Signed   By: MRanda Ngo  M.D.   On: 08/15/2019 21:55   MR BRAIN WO CONTRAST  Result Date: 08/02/2019 CLINICAL DATA:  64 year old male with multiple falls. Encephalopathy, fatigue. EXAM: MRI HEAD WITHOUT CONTRAST TECHNIQUE: Multiplanar, multiecho pulse sequences of the brain and surrounding structures were obtained without intravenous contrast. COMPARISON:  Head CT yesterday. FINDINGS: Brain: Confluent left superior frontal lobe white matter gliosis and encephalomalacia in continuity with the left lateral ventricle. No restricted diffusion to suggest acute infarction. No midline shift, mass effect, evidence of mass lesion, ventriculomegaly, extra-axial collection or acute intracranial hemorrhage. Cervicomedullary junction and pituitary are within normal limits. Additional Patchy and confluent bilateral cerebral white matter T2 and FLAIR hyperintensity. No cortical encephalomalacia or chronic cerebral blood products identified. The deep gray nuclei, brainstem and cerebellum are within normal limits. Vascular: Major intracranial vascular flow voids are preserved. Skull and upper cervical spine: Negative visible cervical spine. Visualized bone marrow signal is within normal limits. Sinuses/Orbits: Negative orbits. Trace paranasal sinus mucosal thickening. Other: Mastoids are clear. Visible internal auditory structures appear normal. Scalp and face soft tissues appear negative. IMPRESSION: 1. No acute intracranial abnormality. 2. 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 Additional moderate for age cerebral white matter signal changes, most commonly due to chronic small vessel disease. Electronically Signed   By: Genevie Ann M.D.   On: 08/02/2019 07:44   DG Chest Port 1 View  Result Date: 08/19/2019 CLINICAL DATA:  Shortness of breath on exertion. EXAM: PORTABLE CHEST 1 VIEW COMPARISON:  08/15/2019 FINDINGS: Stable enlarged cardiac silhouette and prominent hilar vessels. Clear lungs with stable mildly prominent bilateral upper lung zone vascularity. Small left pleural effusion, mildly increased. No right pleural fluid seen today. Thoracic spine degenerative changes. IMPRESSION: 1. Small left pleural effusion, mildly increased. 2. Stable cardiomegaly and mild pulmonary vascular congestion. Electronically Signed   By: Claudie Revering M.D.   On: 08/19/2019 08:35   DG Chest Portable 1 View  Result Date: 08/15/2019 CLINICAL DATA:  Dyspnea. EXAM: PORTABLE CHEST 1 VIEW COMPARISON:  08/01/2019 FINDINGS: The heart size remains enlarged. There is no pneumothorax. There may be a trace right-sided pleural effusion. No acute osseous abnormality. IMPRESSION: No active disease. Electronically Signed   By: Constance Holster M.D.   On: 08/15/2019 20:12   DG Chest Portable 1 View  Result Date: 08/01/2019 CLINICAL DATA:  Change in mental status EXAM: PORTABLE CHEST 1 VIEW COMPARISON:  None. FINDINGS: The heart size and mediastinal contours are within normal limits. Both lungs are clear. The visualized skeletal structures are unremarkable. IMPRESSION: No active disease. Electronically Signed   By: Prudencio Pair M.D.   On: 08/01/2019 17:46   ECHOCARDIOGRAM COMPLETE  Result Date: 08/16/2019    ECHOCARDIOGRAM REPORT   Patient Name:   KENDAL RAFFO Date of Exam: 08/16/2019 Medical Rec #:  [id number]      Height:       69.0 in Accession #:    [id number]     Weight:       209.7 lb Date of Birth:  [birth year]       BSA:          2.11 m Patient Age:    39 years       BP:           108/73 mmHg Patient Gender: M              HR:           90 bpm. Exam [facility] Procedure: 2D Echo Indications:    Congestive Heart Failure 428.0 / I50.9   History:        Patient has no prior history of Echocardiogram examinations.                 CHF; Risk Factors:Non-Smoker, Dyslipidemia and Diabetes.                 Hyperglycemia due to diabetes mellitus.  Sonographer:    Leavy Cella RDCS (AE) Referring Phys: [id number] ASIA B [place]  1. Left ventricular ejection fraction, by estimation, is 20%. The left ventricle has severely decreased function. The left ventricle demonstrates global hypokinesis with regional variation.  2. Large LV mural thrombus in distal anterolateral position, approximately 2.0 x 2.5 cm (trabecular muscle obscures extent to some degree). Reported to Dr. Denton Brick.  3. Right ventricular systolic function is moderately reduced. The right ventricular size is normal. There is mildly elevated pulmonary artery systolic pressure. The estimated right ventricular systolic pressure is 12.1 mmHg.  4. Left atrial size was mildly dilated.  5. Right atrial size was moderately dilated.  6. The mitral valve is grossly normal. Trivial mitral valve regurgitation.  7. The aortic valve is tricuspid. Aortic valve regurgitation is not visualized.  8. The inferior vena cava is normal in size with greater than 50% respiratory variability, suggesting right atrial pressure of 3 mmHg. FINDINGS  Left Ventricle: Left ventricular ejection fraction, by estimation, is 20%. The left ventricle has severely decreased function. The left ventricle demonstrates global hypokinesis. The left ventricular internal cavity size was normal in size. There is no left ventricular hypertrophy. Left ventricular diastolic parameters are consistent with Grade II diastolic dysfunction (pseudonormalization). Right Ventricle: The right ventricular size is normal. No increase in right ventricular wall thickness. Right ventricular systolic function is moderately reduced. There is mildly elevated pulmonary artery systolic pressure. The tricuspid regurgitant velocity is 2.71 m/s, and  with an assumed right atrial pressure of 3 mmHg, the estimated right ventricular systolic pressure is 62.4 mmHg. Left Atrium: Left atrial size was mildly dilated. Right Atrium: Right atrial size was moderately dilated. Pericardium: Trivial pericardial effusion is present. The pericardial effusion is posterior to the left ventricle. Mitral Valve: The mitral valve is grossly normal. Trivial mitral valve regurgitation. Tricuspid Valve: The tricuspid valve is grossly normal. Tricuspid valve regurgitation is mild. Aortic Valve: The aortic valve is tricuspid. Aortic valve regurgitation is not visualized. Pulmonic Valve: The pulmonic valve was grossly normal. Pulmonic valve regurgitation is not visualized. Aorta: The aortic root is normal in size and structure. Venous: The inferior vena cava is normal in size with greater than 50% respiratory variability, suggesting right atrial pressure of 3 mmHg. IAS/Shunts: No atrial level shunt detected by color flow Doppler.  LEFT VENTRICLE PLAX 2D LVIDd:         5.32 cm  Diastology LVIDs:         5.07 cm  LV e' lateral:   8.05 cm/s LV PW:         1.09 cm  LV E/e' lateral: 10.8 LV IVS:        0.86 cm  LV e' medial:    5.98 cm/s LVOT diam:     2.00 cm  LV E/e' medial:  14.6 LV SV Index:   6.61 LVOT Area:     3.14 cm  RIGHT VENTRICLE RV S prime:     9.36 cm/s TAPSE (M-mode): 1.8 cm LEFT ATRIUM  Index       RIGHT ATRIUM           Index LA diam:        3.70 cm 1.76 cm/m  RA Area:     28.20 cm LA Vol (A2C):   83.1 ml 39.42 ml/m RA Volume:   106.00 ml 50.29 ml/m LA Vol (A4C):   56.9 ml 26.99 ml/m LA Biplane Vol: 71.2 ml 33.78 ml/m   AORTA Ao Root diam: 3.40 cm MITRAL VALVE               TRICUSPID VALVE MV Area (PHT): 4.10 cm    TR Peak grad:   29.4 mmHg MV Decel Time: 185 msec    TR Vmax:        271.00 cm/s MV E velocity: 87.30 cm/s MV A velocity: 65.00 cm/s  SHUNTS MV E/A ratio:  1.34        Systemic Diam: 2.00 cm Rozann Lesches MD Electronically signed by Rozann Lesches MD Signature Date/Time: 08/16/2019/11:45:40 AM    Final    US Abdomen Limited RUQ  Result Date: 08/22/2019 CLINICAL DATA:  Increased LFTs EXAM: ULTRASOUND ABDOMEN LIMITED RIGHT UPPER QUADRANT COMPARISON:  CT chest of 08/15/2019 FINDINGS: Gallbladder: Signs of gallbladder wall thickening without reported sonographic Murphy's sign. Edema is present in the wall the gallbladder, thickness approximately 5 to 6 mm. No pericholecystic fluid. Common bile duct: Diameter: 3.6 mm Liver: Mildly lobular and question of nodular liver contours. Trace perihepatic fluid. Portal vein is patent on color Doppler imaging with normal direction of blood flow towards the liver. Other: Right-sided pleural seen as on the recent CT of the chest. IMPRESSION: 1. Gallbladder wall edema, nonspecific finding in the setting of heart failure. If there is clinical concern for acute gallbladder pathology HIDA scan could potentially be helpful. 2. Small volume ascites and right-sided pleural effusion likely cardiogenic. 3. Nodular hepatic contours are suggested which could be related to congestive hepatopathy. Electronically Signed   By: Zetta Bills M.D.   On: 08/22/2019 08:42   Micro Results   Recent Results (from the past 240 hour(s))  Respiratory Panel by RT PCR (Flu A&B, Covid) - Nasopharyngeal Swab     Status: None   Collection Time: 08/15/19  9:52 PM   Specimen: Nasopharyngeal Swab  Result Value Ref Range Status   SARS Coronavirus 2 by RT PCR NEGATIVE NEGATIVE Final    Comment: (NOTE) SARS-CoV-2 target nucleic acids are NOT DETECTED. The SARS-CoV-2 RNA is generally detectable in upper respiratoy specimens during the acute phase of infection. The lowest concentration of SARS-CoV-2 viral copies this assay can detect is 131 copies/mL. A negative result does not preclude SARS-Cov-2 infection and should not be used as the sole basis for treatment or other patient management decisions. A negative result may occur with    improper specimen collection/handling, submission of specimen other than nasopharyngeal swab, presence of viral mutation(s) within the areas targeted by this assay, and inadequate number of viral copies (<131 copies/mL). A negative result must be combined with clinical observations, patient history, and epidemiological information. The expected result is Negative. Fact Sheet for Patients:  PinkCheek.be Fact Sheet for Healthcare Providers:  GravelBags.it This test is not yet ap proved or cleared by the [place] FDA and  has been authorized for detection and/or diagnosis of SARS-CoV-2 by FDA under an Emergency Use Authorization (EUA). This EUA will remain  in effect (meaning this test can be used) for the duration of the COVID-19  declaration under Section 564(b)(1) of the Act, 21 U.S.C. section 360bbb-3(b)(1), unless the authorization is terminated or revoked sooner.    Influenza A by PCR NEGATIVE NEGATIVE Final   Influenza B by PCR NEGATIVE NEGATIVE Final    Comment: (NOTE) The Xpert Xpress SARS-CoV-2/FLU/RSV assay is intended as an aid in  the diagnosis of influenza from Nasopharyngeal swab specimens and  should not be used as a sole basis for treatment. Nasal washings and  aspirates are unacceptable for Xpert Xpress SARS-CoV-2/FLU/RSV  testing. Fact Sheet for Patients: PinkCheek.be Fact Sheet for Healthcare Providers: GravelBags.it This test is not yet approved or cleared by the [place] FDA and  has been authorized for detection and/or diagnosis of SARS-CoV-2 by  FDA under an Emergency Use Authorization (EUA). This EUA will remain  in effect (meaning this test can be used) for the duration of the  Covid-19 declaration under Section 564(b)(1) of the Act, 21  U.S.C. section 360bbb-3(b)(1), unless the authorization is  terminated or revoked. Performed at  [facility], [address] [place], [place] [address]    Today   Subjective    Christianjames Soule today has no chest pain, No  nausea, vomiting, diarrhea      No fever  Or chills        Patient has been seen and examined prior to discharge   Objective   Blood pressure 118/83, pulse 96, temperature 97.9 F (36.6 C), temperature source Oral, resp. rate 18, weight 101.2 kg, SpO2 100 %.   Intake/Output Summary (Last 24 hours) at 08/23/2019 1440 Last data filed at 08/23/2019 1108 Gross per 24 hour  Intake 483 ml  Output 500 ml  Net -17 ml   Exam General exam: Alert, awake, oriented x 3 Respiratory system: improved air movement--no wheezing cardiovascular system:RRR. No murmurs, rubs, gallops. Gastrointestinal system: Abdomen is nondistended, soft and nontender.   Normal bowel sounds heard. Central nervous system: Alert and oriented. No focal neurological deficits. Extremities:  Improved edema bilaterally Skin: No rashes, lesions or ulcers Psychiatry: Judgement and insight appear normal. Mood & affect appropriate   Data Review   CBC w Diff:  Lab Results  Component Value Date   WBC 6.5 08/23/2019   HGB 12.8 (L) 08/23/2019   HCT 40.4 08/23/2019   PLT 144 (L) 08/23/2019   LYMPHOPCT 23 08/16/2019   MONOPCT 10 08/16/2019   EOSPCT 0 08/16/2019   BASOPCT 1 08/16/2019   CMP:  Lab Results  Component Value Date   NA 131 (L) 08/23/2019   K 4.4 08/23/2019   CL 97 (L) 08/23/2019   CO2 24 08/23/2019   BUN 26 (H) 08/23/2019   CREATININE 1.04 08/23/2019   CREATININE 1.01 08/11/2019   PROT 5.9 (L) 08/23/2019   ALBUMIN 2.8 (L) 08/23/2019   BILITOT 1.2 08/23/2019   ALKPHOS 653 (H) 08/23/2019   AST 94 (H) 08/23/2019   ALT 99 (H) 08/23/2019  . Total Discharge time is about 33 minutes  Roxan Hockey M.D on 08/23/2019 at 2:40 PM  Go to [email or web address] -  for contact info  [facility] - Office  [phone]

## 2019-08-23 NOTE — TOC Transition Note (Signed)
Transition of Care [facility]) - CM/SW Discharge Note   Patient Details  Name: Stanley Levy MRN: [id number] Date of Birth: 01/18/1956  Transition [facility] CM/SW Contact:  Leitha Bleak, RN Phone Number: 08/23/2019, 2:19 PM   Clinical Narrative:   Patient discharging home today with multi services.  Updated Clydie Braun with [facility] for Home Health and [name] with [facility] for Palliative services.    Final next level of care: Home/Self Care Barriers to Discharge: Barriers Resolved   Patient Goals and CMS Choice Patient states their goals for this hospitalization and ongoing recovery are:: to go home. CMS Medicare.gov Compare Post Acute Care list provided to:: Patient Choice offered to / list presented to : Patient  Discharge Placement          Patient to be transferred to facility by: family   Patient and family notified of of transfer: 08/23/19  Discharge Plan and Services      HH Arranged: RN, PT [facility] Agency: [facility] Date [facility] Agency Contacted: 08/23/19 Time HH Agency Contacted: 1419 Representative spoke with at [facility] Agency: Hilbert Corrigan    Readmission Risk Interventions Readmission Risk Prevention Plan 08/02/2019  Post Dischage Appt Complete  Medication Screening Complete  Transportation Screening Complete  Some recent data might be hidden

## 2019-08-23 NOTE — Progress Notes (Signed)
Multiple attempts have been made (calling all numbers listed in patient's chart) to get in touch with pt's wife and/or other family member to arrange pick-up due to patient discharge. Only able to get in touch with pt's aunt Stanley Levy) who states she has been unable to reach pt's wife this afternoon as well. Unable to get in touch with wife and pt says she is the only one who can arrange transportation home for him, as his aunt is disabled and cannot drive. Will attempt to contact wife again in 15 minutes.

## 2019-08-23 NOTE — Progress Notes (Signed)
Pt's wife and neighbor arrived to pick up patient. Pt dressed, belongings returned, IV and condom cath removed. Discharge instructions reviewed with patient and he stated understanding. Discharged via wheelchair by staff to POV.

## 2019-08-23 NOTE — Progress Notes (Signed)
Inpatient Diabetes Program Recommendations  AACE/ADA: New Consensus Statement on Inpatient Glycemic Control   Target Ranges:  Prepandial:   less than 140 mg/dL      Peak postprandial:   less than 180 mg/dL (1-2 hours)      Critically ill patients:  140 - 180 mg/dL   Results for NARESH, ALTHAUS (MRN [id number]) as of 08/23/2019 10:57  Ref. Range 08/22/2019 07:34 08/22/2019 11:30 08/22/2019 16:06 08/22/2019 21:52 08/23/2019 07:47  Glucose-Capillary Latest Ref Range: 70 - 99 mg/dL 798 (H) 102 (H) 548 (H) 360 (H) 141 (H)  Results for CLIF, SERIO (MRN [id number]) as of 08/23/2019 10:57  Ref. Range 08/21/2019 07:38 08/21/2019 11:28 08/21/2019 15:35 08/21/2019 21:04  Glucose-Capillary Latest Ref Range: 70 - 99 mg/dL 010 (H) 404 (H) 591 (H) 301 (H)   Review of Glycemic Control  Diabetes history:DM2 Outpatient Diabetes medications:70/30 25 units BID Current orders for Inpatient glycemic control:Levemir 5 units QHS, Novolog 0-15units TID with meals, Novolog 0-5 units QHS  Inpatient Diabetes Program Recommendations:    Insulin-Meal Coverage:  Please consider ordering Novolog 4 units TID with meals for meal coverage if patient eats at least 50% of meals.  Thanks, Orlando Penner, RN, MSN, CDE Diabetes Coordinator Inpatient Diabetes Program [phone] (Team Pager from 8am to 5pm)

## 2019-08-24 ENCOUNTER — Emergency Department (HOSPITAL_COMMUNITY): Payer: PPO

## 2019-08-24 ENCOUNTER — Telehealth: Payer: Self-pay

## 2019-08-24 ENCOUNTER — Encounter (HOSPITAL_COMMUNITY): Payer: Self-pay | Admitting: Emergency Medicine

## 2019-08-24 ENCOUNTER — Other Ambulatory Visit: Payer: Self-pay

## 2019-08-24 ENCOUNTER — Emergency Department (HOSPITAL_COMMUNITY)
Admission: EM | Admit: 2019-08-24 | Discharge: 2019-08-24 | Disposition: A | Discharge status: Home / Self Care | Payer: PPO | Attending: Emergency Medicine | Admitting: Emergency Medicine

## 2019-08-24 DIAGNOSIS — M7989 Other specified soft tissue disorders: Secondary | ICD-10-CM | POA: Insufficient documentation

## 2019-08-24 DIAGNOSIS — I110 Hypertensive heart disease with heart failure: Secondary | ICD-10-CM | POA: Diagnosis not present

## 2019-08-24 DIAGNOSIS — I509 Heart failure, unspecified: Secondary | ICD-10-CM | POA: Diagnosis not present

## 2019-08-24 DIAGNOSIS — E119 Type 2 diabetes mellitus without complications: Secondary | ICD-10-CM | POA: Insufficient documentation

## 2019-08-24 DIAGNOSIS — R531 Weakness: Secondary | ICD-10-CM | POA: Diagnosis not present

## 2019-08-24 DIAGNOSIS — J90 Pleural effusion, not elsewhere classified: Secondary | ICD-10-CM | POA: Diagnosis not present

## 2019-08-24 LAB — CBC
HCT: 44.3 % (ref 39.0–52.0)
Hemoglobin: 13.8 g/dL (ref 13.0–17.0)
MCH: 32.3 pg (ref 26.0–34.0)
MCHC: 31.2 g/dL (ref 30.0–36.0)
MCV: 103.7 fL — ABNORMAL HIGH (ref 80.0–100.0)
Platelets: 134 10*3/uL — ABNORMAL LOW (ref 150–400)
RBC: 4.27 MIL/uL (ref 4.22–5.81)
RDW: 15.4 % (ref 11.5–15.5)
WBC: 7 10*3/uL (ref 4.0–10.5)
nRBC: 0 % (ref 0.0–0.2)

## 2019-08-24 LAB — TROPONIN I (HIGH SENSITIVITY)
Troponin I (High Sensitivity): 16 ng/L (ref <18)
Troponin I (High Sensitivity): 21 ng/L — ABNORMAL HIGH (ref <18)

## 2019-08-24 LAB — BASIC METABOLIC PANEL
Anion gap: 14 (ref 5–15)
BUN: 22 mg/dL (ref 8–23)
CO2: 18 mmol/L — ABNORMAL LOW (ref 22–32)
Calcium: 8.3 mg/dL — ABNORMAL LOW (ref 8.9–10.3)
Chloride: 102 mmol/L (ref 98–111)
Creatinine, Ser: 0.99 mg/dL (ref 0.61–1.24)
GFR calc Af Amer: >60 mL/min (ref >60)
GFR calc non Af Amer: >60 mL/min (ref >60)
Glucose, Bld: 121 mg/dL — ABNORMAL HIGH (ref 70–99)
Potassium: 5 mmol/L (ref 3.5–5.1)
Sodium: 134 mmol/L — ABNORMAL LOW (ref 135–145)

## 2019-08-24 MED ORDER — FUROSEMIDE 20 MG PO TABS
40.0000 mg | ORAL_TABLET | Freq: Once | ORAL | Status: AC
  Administered 2019-08-24: 40 mg via ORAL
  Filled 2019-08-24: qty 2

## 2019-08-24 MED ORDER — SODIUM CHLORIDE 0.9% FLUSH: 3.0000 mL | Freq: Once | INTRAVENOUS | Status: DC

## 2019-08-24 NOTE — Telephone Encounter (Signed)
Per Kennyth Arnold with Palliative care. Phone call placed to patient's wife to schedule visit with Palliative care. Wife handed phone to her sister who stated that they could not schedule visit and was unclear of what was going on. Followed up with Case Manager at hospital. Patient is alert and oriented, in agreement with Palliative care services. Will attempt to schedule visit when able to speak with patient.

## 2019-08-24 NOTE — ED Provider Notes (Signed)
[facility] Emergency Department Provider Note MRN:  [id number]  Arrival date & time: 08/24/19     Chief Complaint   Heart Failure   History of Present Illness   Stanley Levy is a 64 y.o. year-old male with a history of CHF, diabetes presenting to the ED with chief complaint of heart failure.  Patient explains that he was recently in the hospital for heart failure and he was told that his heart is very weak.  He has not been taking any of his medicine nor has he followed up with cardiology.  He is here for lower extremity swelling which is getting worse since he was discharged home.  He denies chest pain or shortness of breath, no fever, no cough, no other complaints.  Review of Systems  A complete 10 system review of systems was obtained and all systems are negative except as noted in the HPI and PMH.   Patient's Health History    Past Medical History:  Diagnosis Date  . Anxiety   . Diabetes mellitus, type II ([place])   . Hyperlipidemia   . Thyroid disease     Past Surgical History:  Procedure Laterality Date  . FOOT SURGERY      Family History  Problem Relation Age of Onset  . Heart attack Father        Near Age 60  . Anxiety disorder Neg Hx   . Dementia Neg Hx   . Alcohol abuse Neg Hx   . Drug abuse Neg Hx   . ADD / ADHD Neg Hx   . Bipolar disorder Neg Hx   . Depression Neg Hx   . OCD Neg Hx   . Paranoid behavior Neg Hx   . Physical abuse Neg Hx   . Schizophrenia Neg Hx   . Seizures Neg Hx   . Sexual abuse Neg Hx     Social History   Socioeconomic History  . Marital status: Married    Spouse name: Josias Tomerlin   . Number of children: Not on file  . Years of education: Not on file  . Highest education level: High school graduate  Occupational History  . Not on file  Tobacco Use  . Smoking status: Never Smoker  . Smokeless tobacco: Never Used  Substance and Sexual Activity  . Alcohol use: No  . Drug use: No  . Sexual activity:  Yes  Other Topics Concern  . Not on file  Social History Narrative   Recent left working at [employer] after getting sick in start of February      Lives with wife [name]- married 36 (he is her caregiver, she has had strokes)    No children      Beagle: Lorre Nick       Enjoys: weight lift, yard work, Oceanographer, read-       Diet: increasing veggies, was drinking chocolate milk, root beer, and sweet tea, fast food   Caffeine: sweet tea-3 cups of more daily    Water: 8 cups daily       Wears seat belt    Smoke detectors    Does not use phone while driving    Social Determinants of Health   Financial Resource Strain: Low Risk   . Difficulty of Paying Living Expenses: Not hard at all  Food Insecurity: No Food Insecurity  . Worried About Charity fundraiser in the Last Year: Never true  . Ran Out of Food in the Last  Year: Never true  Transportation Needs: No Transportation Needs  . Lack of Transportation (Medical): No  . Lack of Transportation (Non-Medical): No  Physical Activity: Inactive  . Days of Exercise per Week: 0 days  . Minutes of Exercise per Session: 0 min  Stress: No Stress Concern Present  . Feeling of Stress : Not at all  Social Connections: Slightly Isolated  . Frequency of Communication with Friends and Family: Twice a week  . Frequency of Social Gatherings with Friends and Family: Twice a week  . Attends Religious Services: 1 to 4 times per year  . Active Member of Clubs or Organizations: No  . Attends Banker Meetings: Never  . Marital Status: Married  Catering manager Violence: Not At Risk  . Fear of Current or Ex-Partner: No  . Emotionally Abused: No  . Physically Abused: No  . Sexually Abused: No     Physical Exam   Vitals:   08/24/19 1553 08/24/19 2050  BP: 121/76 114/83  Pulse: 85 85  Resp: 18 18  Temp: 98.1 F (36.7 C) 98.3 F (36.8 C)  SpO2: 98% 100%    CONSTITUTIONAL: Chronically ill-appearing, NAD NEURO:  Alert and oriented x 3, no  focal deficits EYES:  eyes equal and reactive ENT/NECK:  no LAD, no JVD CARDIO: Regular rate, well-perfused, normal S1 and S2 PULM:  CTAB no wheezing or rhonchi GI/GU:  normal bowel sounds, non-distended, non-tender MSK/SPINE:  No gross deformities, 1+ pitting edema to the bilateral lower extremities SKIN:  no rash, atraumatic PSYCH:  Appropriate speech and behavior  *Additional and/or pertinent findings included in MDM below  Diagnostic and Interventional Summary    EKG Interpretation  Date/Time:  24-August-2019 at 16: 59: 31 Ventricular Rate:  88 PR Interval:  172 QRS Duration: 94 QT Interval:  362 QTC Calculation: 434 R Axis:     Text Interpretation: Sinus rhythm, no significant change from prior Confirmed by Dr. Kennis Carina at 10:30 PM      Cardiac Monitoring Interpretation:  Labs Reviewed  BASIC METABOLIC PANEL - Abnormal; Notable for the following components:      Result Value   Sodium 134 (*)    CO2 18 (*)    Glucose, Bld 121 (*)    Calcium 8.3 (*)    All other components within normal limits  CBC - Abnormal; Notable for the following components:   MCV 103.7 (*)    Platelets 134 (*)    All other components within normal limits  TROPONIN I (HIGH SENSITIVITY) - Abnormal; Notable for the following components:   Troponin I (High Sensitivity) 21 (*)    All other components within normal limits  TROPONIN I (HIGH SENSITIVITY)    DG Chest 2 View  Final Result      Medications  sodium chloride flush (NS) 0.9 % injection 3 mL (has no administration in time range)  furosemide (LASIX) tablet 40 mg (40 mg Oral Given 08/24/19 2227)     Procedures  /  Critical Care Procedures  ED Course and Medical Decision Making  I have reviewed the triage vital signs, the nursing notes, and pertinent available records from the EMR.  Pertinent labs & imaging results that were available during my care of the patient were reviewed by me and considered in my medical decision  making (see below for details).     Lower extremity edema seems to be due to CHF that is not being managed at home with medications.  He has no  chest pain or shortness of breath, chest x-ray without any significant pulmonary edema, vital signs are normal, he is in no acute distress, currently I see no indication for inpatient and there is considerable improvement that can be done at home, namely taking his medications.  He expresses some confusion with all of his medications.  Pharmacy was consulted to help with medication counseling and guidance.  He was given a dose of oral Lasix here in the emergency department, he is appropriate for discharge.    Elmer Sow. Pilar Plate, MD [facility] Emergency Medicine [facility] [email or web address]  Final Clinical Impressions(s) / ED Diagnoses     ICD-10-CM   1. Leg swelling  M79.89     ED Discharge Orders    None       Discharge Instructions Discussed with and Provided to Patient:     Discharge Instructions     You were evaluated in the Emergency Department and after careful evaluation, we did not find any emergent condition requiring admission or further testing in the hospital.  Your exam/testing today was overall reassuring.  Please take your medications as we discussed and follow-up with your cardiologist.  Please return to the Emergency Department if you experience any worsening of your condition.  We encourage you to follow up with a primary care provider.  Thank you for allowing [place] to be a part of your care.        Sabas Sous, MD 08/24/19 2230

## 2019-08-24 NOTE — ED Notes (Signed)
Pt called for vitals recheck in lobby no answer 

## 2019-08-24 NOTE — ED Triage Notes (Signed)
Pt arrives to ED from home with complaints of increased swelling in his legs and stomach for months now. Patient is a poor historian with little known about own health problems. Patient was admitted at [facility] for new onset congestive heart failure with LV EF of 20%. Patient here is today at [facility] because he "just wants to feel better." Patient has not taken newly ordered prescriptions and has not followed up with cardiology.

## 2019-08-24 NOTE — ED Notes (Signed)
Patient verbalizes understanding of discharge instructions. Opportunity for questioning and answers were provided. Armband removed by staff, pt discharged from ED.  

## 2019-08-24 NOTE — Discharge Instructions (Addendum)
You were evaluated in the Emergency Department and after careful evaluation, we did not find any emergent condition requiring admission or further testing in the hospital.  Your exam/testing today was overall reassuring.  Please take your medications as we discussed and follow-up with your cardiologist.  Please return to the Emergency Department if you experience any worsening of your condition.  We encourage you to follow up with a primary care provider.  Thank you for allowing [place] to be a part of your care.

## 2019-08-25 ENCOUNTER — Telehealth: Payer: Self-pay

## 2019-08-25 NOTE — Telephone Encounter (Signed)
Phone call placed to patient. No answer, no ability to leave VM

## 2019-08-25 NOTE — Telephone Encounter (Signed)
Phone call placed to patient's cell number. Voicemail is full

## 2019-08-26 ENCOUNTER — Telehealth: Payer: Self-pay

## 2019-08-26 NOTE — Telephone Encounter (Signed)
Spoke with contact/Aunt,Mable, to inquire if there were other contact numbers as the one's listed will not allow for me to leave a message. Mable provided alternative numbers and stated that if unable to reach patient to call her back next week and she will go to his house with Palliative care's number.

## 2019-08-26 NOTE — Telephone Encounter (Signed)
Phone call placed to sister in law, Puako. VM left

## 2019-08-29 ENCOUNTER — Telehealth: Payer: Self-pay

## 2019-08-29 NOTE — Telephone Encounter (Signed)
Please do not all Joen Laura, she was listed as an Science writer, and she is not sure why. Abby called her to get in touch with PT, Mabel Reached out and was blessed out. Also Mabel is not on the DPR that I can locate. So she has been removed per her Request

## 2019-08-29 NOTE — Telephone Encounter (Signed)
Phone call placed to patient to introduce Palliative care and to offer to schedule a visit with NP. Stanley Levy is in agreement with Palliative care. Verbal consent obtained. Scheduled visit for Friday 09/02/2019 @ 10:30 am

## 2019-09-01 NOTE — Progress Notes (Incomplete)
? ? ?  Manufacturing engineer ?Community Palliative Care Consult Note ?Telephone: [phone]  ?Fax: [phone] ? ?PATIENT NAME: Stanley Levy ?DOB: 19-Jan-1956 ?MRN: [id number] ? ?PRIMARY CARE PROVIDER:   Perlie Mayo, NP ? ?REFERRING PROVIDER:  Perlie Mayo, NP ?[address] ?[place],  [place] [address] ? ?RESPONSIBLE PARTY:    ? ?ASSESSMENT:     ?  ? ?RECOMMENDATIONS and PLAN:  ?1. ? ?I spent *** minutes providing this consultation,  from *** to ***. More than 50% of the time in this consultation was spent coordinating communication.  ? ?HISTORY OF PRESENT ILLNESS:  Stanley Levy is a 64 y.o. year old male with multiple medical problems including ***. Palliative Care was asked to help address goals of care.  ? ?CODE STATUS:  ? ?PPS: 0% ?HOSPICE ELIGIBILITY/DIAGNOSIS: TBD ? ?PAST MEDICAL HISTORY:  ?Past Medical History:  ?Diagnosis Date  ?? Anxiety   ?? Diabetes mellitus, type II ([place])   ?? Hyperlipidemia   ?? Thyroid disease   ?  ?SOCIAL HX:  ?Social History  ? ?Tobacco Use  ?? Smoking status: Never Smoker  ?? Smokeless tobacco: Never Used  ?Substance Use Topics  ?? Alcohol use: No  ? ? ?ALLERGIES: No Known Allergies   ?PERTINENT MEDICATIONS:  ?Outpatient Encounter Medications as of 09/02/2019  ?Medication Sig  ?? acetaminophen (TYLENOL) 325 MG tablet Take 2 tablets (650 mg total) by mouth every 4 (four) hours as needed for headache or mild pain.  ?? apixaban (ELIQUIS) 5 MG TABS tablet Take 1 tablet (5 mg total) by mouth 2 (two) times daily.  ?? blood glucose meter kit and supplies KIT Dispense based on patient and insurance preference. Use up to four times daily as directed. (FOR ICD-9 250.00, 250.01). (Patient not taking: Reported on 08/15/2019)  ?? blood glucose meter kit and supplies Dispense based on patient and insurance preference. Use up to four times daily as directed. (FOR ICD-10 E10.9, E11.9). (Patient not taking: Reported on 08/15/2019)  ? ? digoxin (LANOXIN) 0.125 MG tablet Take 1 tablet (0.125 mg total) by mouth daily.  ?? furosemide (LASIX) 20 MG

## 2019-09-02 ENCOUNTER — Other Ambulatory Visit: Payer: Self-pay

## 2019-09-02 ENCOUNTER — Other Ambulatory Visit: Payer: PPO | Admitting: Internal Medicine

## 2019-09-06 ENCOUNTER — Other Ambulatory Visit: Payer: PPO | Admitting: Internal Medicine

## 2019-09-06 ENCOUNTER — Other Ambulatory Visit: Payer: Self-pay

## 2019-09-06 DIAGNOSIS — Z515 Encounter for palliative care: Secondary | ICD-10-CM | POA: Diagnosis not present

## 2019-09-06 DIAGNOSIS — Z7189 Other specified counseling: Secondary | ICD-10-CM | POA: Diagnosis not present

## 2019-09-07 ENCOUNTER — Encounter: Payer: Self-pay | Admitting: Internal Medicine

## 2019-09-07 NOTE — Progress Notes (Signed)
March 9th, 2021 [facility] Palliative Care Consult Note Telephone: [phone]  Fax: [phone]  PATIENT NAME: Stanley Levy DOB: 06-19-1956 MRN: [id number] [address] [place] [address] [phone] (Home Phone) [phone] (Mobile)  PRIMARY CARE PROVIDER:   Perlie Mayo, NP  REFERRING PROVIDER:  Perlie Mayo, NP [address] [place],  [place] [address]  RESPONSIBLE PARTY:   Nadara Mode [phone] [facility] Phone)   ASSESSMENT / RECOMMENDATIONS:  1. Advance Care Planning: A. Directives: Not discussed this visit d/t time limitations/visit to establish trust B. Goals of Care: Wishes to increased his ability to exercise, gain in strength.   2. Symptom Management: Patient is A & O x 3. He is independent in ADLs.  A. CHF: Patient notes periodic episodes spontaneous dyspnea. Nightly orthopnea after sleeping about 2 hours. Marked LE edema pitting to high thigh, progressive since discharge from the hospital. Patient notes increase fluid weight in his legs, sometimes needing to use his arms to lift legs up. Reports weeping edema about calves noted in the am. He follows a low salt diet.   -I will drop off a scale to patient's home in the next few days. Plan to have him weight himself qam to help monitor weight and provide guidance for diuresis.  -I had the patient take an extra 40 mg of Lasix at the time of my visit and changed his Lasix from 69m bid, to 46mqam.    -Needs enrollment into the heart failure clinic, for close monitoring of aggressive diuresis. He's scheduled to establish at the cardiology clinic March 16th.  -He'll be seen by his PCP March 11th.   B. Diabetes 2: Patient has been limiting the sweets in his diet. He was issued a new glucometer last hospitalization but lost the spring pen that launches the lancet. We looked around the house and finally located the glucometer and strips. We also found a box of lancets that you don't need a pen to  utilize. We watched a video on proper use of the glucometer and patient demonstrated good techniques. His CBG was 281. He has a log-book to write down his sugars and plans to test bid. Patient has his insulin pen and described its' proper use/dosage/schedule.  C. Education: We went through patient medication list and discussed what each medication was for. I assisted patient's wife [name] with filling the pill box; she did a really great job and needed very little assistance from me. He has been compliant with taking his medications.  will bring his med list with him to his next OV with his PCP on 09/08/2019. He plans to ask her about the imipramine, which is on his med list, but we don't have the bottle.   D. Falls: Patient tries to remember to ambulate with his cane. He episodically will experience spontaneous weakness in his hips and knees, which causes him to fall should he be up and about. Last fall 3 days ago. No warning. Denies any other associated symptoms (no CP/palpitation/dyspnea/lightheadedness/dizziness). He's had a total of 4 falls since hospitalization, two of these off his porch.3  3. Family Supports: Lives with wife TeKarna Christmasmarried for 30 yrs. Patient is her caregiver (she has had strokes). No children. BeScientific laboratory technicianEnjoys lifting weights, yard work, watching TV, reading. Recently retired from working at [employer] getting sick early Feb.  TeCon Memosas a twin sister ShJudeen Hammansho drives patient to appointments.   4. Follow up Palliative Care Visit: Thurs 09/15/2019 @  3:30pm @ the home. F/u with cardiologist : office to call to schedule apt.  I spent 90 minutes providing this consultation from 3pm to 4:40pm. More than 50% of the time in this consultation was spent coordinating communication.   HISTORY OF PRESENT ILLNESS:  Stanley Levy is a 64 y.o. male with h/o CHF (systolic), intracardiac thrombosis (Eliquis), DM2, hypothyroidism, frequent falls, cognitive/memory challenges, hyperlipidemia,  and anxiety. -2/11-1/05/2020 ER hyperglycemia glucose 562, A1C 18% -2/15-2/23/2021: Hospitalized with acute on chronic syst/diastolic CHF; (Echo 07/05/2692) EF 20%. Left ventricular mural thrombus. Palliative Care was asked to consult for supportive care/education/goals of care.      CODE STATUS: full code  PPS: 60%  HOSPICE ELIGIBILITY/DIAGNOSIS: TBD  PAST MEDICAL HISTORY:  Past Medical History:  Diagnosis Date  . Anxiety   . Diabetes mellitus, type II ([place])   . Hyperlipidemia   . Thyroid disease     SOCIAL HX:  Social History   Tobacco Use  . Smoking status: Never Smoker  . Smokeless tobacco: Never Used  Substance Use Topics  . Alcohol use: No    ALLERGIES: No Known Allergies   PERTINENT MEDICATIONS:  Outpatient Encounter Medications as of 09/06/2019  Medication Sig  . acetaminophen (TYLENOL) 325 MG tablet Take 2 tablets (650 mg total) by mouth every 4 (four) hours as needed for headache or mild pain.  Marland Kitchen apixaban (ELIQUIS) 5 MG TABS tablet Take 1 tablet (5 mg total) by mouth 2 (two) times daily.  . blood glucose meter kit and supplies KIT Dispense based on patient and insurance preference. Use up to four times daily as directed. (FOR ICD-9 250.00, 250.01).  . blood glucose meter kit and supplies Dispense based on patient and insurance preference. Use up to four times daily as directed. (FOR ICD-10 E10.9, E11.9).  Marland Kitchen digoxin (LANOXIN) 0.125 MG tablet Take 1 tablet (0.125 mg total) by mouth daily.  . furosemide (LASIX) 20 MG tablet Take 1 tablet (20 mg total) by mouth 2 (two) times daily. (Patient taking differently: Take 40 mg by mouth daily. 2 tabs (69m) every am)  . gabapentin (NEURONTIN) 100 MG capsule Take 1 capsule (100 mg total) by mouth 3 (three) times daily.  .Marland Kitchenglucose blood (GLUCOSE METER TEST) test strip Use as instructed  . insulin aspart protamine - aspart (NOVOLOG MIX 70/30 FLEXPEN) (70-30) 100 UNIT/ML FlexPen Inject 0.25 mLs (25 Units total) into the skin 2 (two)  times daily with a meal.  . Insulin Pen Needle 31G X 5 MM MISC 1 Units by Does not apply route 2 (two) times daily.  .Marland Kitchenlevothyroxine (SYNTHROID) 75 MCG tablet Take 0.5 tablets (37.5 mcg total) by mouth daily before breakfast.  . lisinopril (ZESTRIL) 2.5 MG tablet Take 1 tablet (2.5 mg total) by mouth daily.  . rosuvastatin (CRESTOR) 5 MG tablet Take 1 tablet (5 mg total) by mouth every evening.  .Marland Kitchenspironolactone (ALDACTONE) 25 MG tablet Take 0.5 tablets (12.5 mg total) by mouth daily.  . Vitamin D, Ergocalciferol, (DRISDOL) 1.25 MG (50000 UNIT) CAPS capsule Take 1 capsule (50,000 Units total) by mouth every 7 (seven) days.  .Marland Kitchenimipramine (TOFRANIL) 50 MG tablet Take 1 tablet (50 mg total) by mouth at bedtime.  . tamsulosin (FLOMAX) 0.4 MG CAPS capsule Take 1 capsule (0.4 mg total) by mouth daily after supper. (Patient not taking: Reported on 09/07/2019)   No facility-administered encounter medications on file as of 09/06/2019.    PHYSICAL EXAM:   BP 140-82, HR 88, RR 28 Well nourished,  pleasantly conversant; some dyspnea with conversation. Wife [name] in attendance Cardiovascular: regular rate and rhythm; summation gallop Pulmonary: lung fields clear Extremities: Bilateral LE edema, pitting to high thigh Neurological: Weakness but otherwise nonfocal  Julianne Handler, NP

## 2019-09-08 ENCOUNTER — Encounter: Payer: Self-pay | Admitting: Family Medicine

## 2019-09-08 ENCOUNTER — Ambulatory Visit (INDEPENDENT_AMBULATORY_CARE_PROVIDER_SITE_OTHER): Payer: PPO | Admitting: Family Medicine

## 2019-09-08 ENCOUNTER — Emergency Department (HOSPITAL_COMMUNITY): Payer: PPO

## 2019-09-08 ENCOUNTER — Emergency Department (HOSPITAL_COMMUNITY)
Admission: EM | Admit: 2019-09-08 | Discharge: 2019-09-08 | Disposition: A | Discharge status: Home / Self Care | Payer: PPO | Attending: Emergency Medicine | Admitting: Emergency Medicine

## 2019-09-08 ENCOUNTER — Encounter (HOSPITAL_COMMUNITY): Payer: Self-pay

## 2019-09-08 ENCOUNTER — Other Ambulatory Visit: Payer: Self-pay

## 2019-09-08 VITALS — BP 112/80 | HR 80 | Temp 97.5°F | Resp 16 | Ht 69.0 in | Wt 234.0 lb

## 2019-09-08 DIAGNOSIS — I110 Hypertensive heart disease with heart failure: Secondary | ICD-10-CM | POA: Insufficient documentation

## 2019-09-08 DIAGNOSIS — Y92009 Unspecified place in unspecified non-institutional (private) residence as the place of occurrence of the external cause: Secondary | ICD-10-CM

## 2019-09-08 DIAGNOSIS — E1165 Type 2 diabetes mellitus with hyperglycemia: Secondary | ICD-10-CM

## 2019-09-08 DIAGNOSIS — W19XXXA Unspecified fall, initial encounter: Secondary | ICD-10-CM

## 2019-09-08 DIAGNOSIS — E119 Type 2 diabetes mellitus without complications: Secondary | ICD-10-CM | POA: Insufficient documentation

## 2019-09-08 DIAGNOSIS — Z7901 Long term (current) use of anticoagulants: Secondary | ICD-10-CM | POA: Diagnosis not present

## 2019-09-08 DIAGNOSIS — Y939 Activity, unspecified: Secondary | ICD-10-CM | POA: Insufficient documentation

## 2019-09-08 DIAGNOSIS — S0990XA Unspecified injury of head, initial encounter: Secondary | ICD-10-CM | POA: Diagnosis not present

## 2019-09-08 DIAGNOSIS — E669 Obesity, unspecified: Secondary | ICD-10-CM

## 2019-09-08 DIAGNOSIS — Z9181 History of falling: Secondary | ICD-10-CM

## 2019-09-08 DIAGNOSIS — Y999 Unspecified external cause status: Secondary | ICD-10-CM | POA: Insufficient documentation

## 2019-09-08 DIAGNOSIS — I5040 Unspecified combined systolic (congestive) and diastolic (congestive) heart failure: Secondary | ICD-10-CM | POA: Insufficient documentation

## 2019-09-08 DIAGNOSIS — Z794 Long term (current) use of insulin: Secondary | ICD-10-CM | POA: Insufficient documentation

## 2019-09-08 DIAGNOSIS — W06XXXA Fall from bed, initial encounter: Secondary | ICD-10-CM | POA: Diagnosis not present

## 2019-09-08 DIAGNOSIS — R4189 Other symptoms and signs involving cognitive functions and awareness: Secondary | ICD-10-CM

## 2019-09-08 DIAGNOSIS — I10 Essential (primary) hypertension: Secondary | ICD-10-CM

## 2019-09-08 DIAGNOSIS — E039 Hypothyroidism, unspecified: Secondary | ICD-10-CM | POA: Insufficient documentation

## 2019-09-08 DIAGNOSIS — Z79899 Other long term (current) drug therapy: Secondary | ICD-10-CM | POA: Insufficient documentation

## 2019-09-08 DIAGNOSIS — Z6828 Body mass index (BMI) 28.0-28.9, adult: Secondary | ICD-10-CM | POA: Insufficient documentation

## 2019-09-08 DIAGNOSIS — Y92013 Bedroom of single-family (private) house as the place of occurrence of the external cause: Secondary | ICD-10-CM | POA: Insufficient documentation

## 2019-09-08 DIAGNOSIS — I5041 Acute combined systolic (congestive) and diastolic (congestive) heart failure: Secondary | ICD-10-CM

## 2019-09-08 DIAGNOSIS — E663 Overweight: Secondary | ICD-10-CM | POA: Insufficient documentation

## 2019-09-08 DIAGNOSIS — I513 Intracardiac thrombosis, not elsewhere classified: Secondary | ICD-10-CM

## 2019-09-08 LAB — CBG MONITORING, ED: Glucose-Capillary: 79 mg/dL (ref 70–99)

## 2019-09-08 LAB — GLUCOSE, POCT (MANUAL RESULT ENTRY): POC Glucose: 69 mg/dl — AB (ref 70–99)

## 2019-09-08 MED ORDER — ONETOUCH DELICA LANCETS 33G MISC: 5 refills | Status: AC

## 2019-09-08 NOTE — Progress Notes (Signed)
Subjective:  Patient ID: DEVONE TOUSLEY, male    DOB: 26-Mar-1956  Age: 64 y.o. MRN: [id number]  CC:  Chief Complaint  Patient presents with  . Diabetes    had episode yesterday at [facility] where he started jerking in his arms and panting but his sugar wasn't checked. Still has the quivers  . Fall    sugar was 38 and fell and hit his eye on dresser and EMS was called this am but declined to go to ER   . Edema    feet are swollen and tender on the heels states both heels are cracked open and sore and cannot put pressure on them       HPI  HPI Advised to go to the ER secondary to fall and low Blood sugar reading in the office.   Today patient denies signs and symptoms of COVID 19 infection including fever, chills, cough, shortness of breath, and headache. Past Medical, Surgical, Social History, Allergies, and Medications have been Reviewed.   Past Medical History:  Diagnosis Date  . Anxiety   . Diabetes mellitus, type II ([place])   . Hyperlipidemia   . Thyroid disease     Current Meds  Medication Sig  . acetaminophen (TYLENOL) 325 MG tablet Take 2 tablets (650 mg total) by mouth every 4 (four) hours as needed for headache or mild pain.  Marland Kitchen apixaban (ELIQUIS) 5 MG TABS tablet Take 1 tablet (5 mg total) by mouth 2 (two) times daily.  . blood glucose meter kit and supplies KIT Dispense based on patient and insurance preference. Use up to four times daily as directed. (FOR ICD-9 250.00, 250.01).  . blood glucose meter kit and supplies Dispense based on patient and insurance preference. Use up to four times daily as directed. (FOR ICD-10 E10.9, E11.9).  Marland Kitchen digoxin (LANOXIN) 0.125 MG tablet Take 1 tablet (0.125 mg total) by mouth daily.  . furosemide (LASIX) 20 MG tablet Take 1 tablet (20 mg total) by mouth 2 (two) times daily. (Patient taking differently: Take 40 mg by mouth daily. 2 tabs (86m) every am)  . gabapentin (NEURONTIN) 100 MG capsule Take 1 capsule (100 mg total) by  mouth 3 (three) times daily.  .Marland Kitchenglucose blood (GLUCOSE METER TEST) test strip Use as instructed  . insulin aspart protamine - aspart (NOVOLOG MIX 70/30 FLEXPEN) (70-30) 100 UNIT/ML FlexPen Inject 0.25 mLs (25 Units total) into the skin 2 (two) times daily with a meal.  . Insulin Pen Needle 31G X 5 MM MISC 1 Units by Does not apply route 2 (two) times daily.  .Marland Kitchenlevothyroxine (SYNTHROID) 75 MCG tablet Take 0.5 tablets (37.5 mcg total) by mouth daily before breakfast.  . rosuvastatin (CRESTOR) 5 MG tablet Take 1 tablet (5 mg total) by mouth every evening.  .Marland Kitchenspironolactone (ALDACTONE) 25 MG tablet Take 0.5 tablets (12.5 mg total) by mouth daily.  . tamsulosin (FLOMAX) 0.4 MG CAPS capsule Take 1 capsule (0.4 mg total) by mouth daily after supper.  . Vitamin D, Ergocalciferol, (DRISDOL) 1.25 MG (50000 UNIT) CAPS capsule Take 1 capsule (50,000 Units total) by mouth every 7 (seven) days.    ROS:  Review of Systems  Constitutional: Negative.   HENT: Negative.   Eyes: Negative.   Respiratory: Negative.   Cardiovascular: Negative.   Gastrointestinal: Negative.   Genitourinary: Negative.   Musculoskeletal: Negative.   Skin: Negative.   Neurological: Negative.   Endo/Heme/Allergies: Negative.   Psychiatric/Behavioral: Negative.   All other  systems reviewed and are negative.    Objective:   Today's Vitals: BP 112/80   Pulse 80   Temp (!) 97.5 F (36.4 C) (Temporal)   Resp 16   Ht 5' 9"  (1.753 m)   Wt 234 lb (106.1 kg)   SpO2 96%   BMI 34.56 kg/m  Vitals with BMI 09/08/2019 08/24/2019 08/24/2019  Height 5' 9"  - -  Weight 234 lbs - -  BMI 41.32 - -  Systolic 440 102 725  Diastolic 80 83 76  Pulse 80 85 85  Some encounter information is confidential and restricted. Go to Review Flowsheets activity to see all data.     Physical Exam Vitals and nursing note reviewed.  Constitutional:      Appearance: Normal appearance. He is well-developed and well-groomed. He is obese.  HENT:      Head: Normocephalic and atraumatic.     Right Ear: External ear normal.     Left Ear: External ear normal.     Mouth/Throat:     Comments: Mask in place   Eyes:     General:        Right eye: No discharge.        Left eye: No discharge.     Conjunctiva/sclera: Conjunctivae normal.  Cardiovascular:     Rate and Rhythm: Normal rate and regular rhythm.     Pulses: Normal pulses.     Heart sounds: Normal heart sounds.  Pulmonary:     Effort: Pulmonary effort is normal.     Breath sounds: Normal breath sounds.  Musculoskeletal:        General: Normal range of motion.     Cervical back: Normal range of motion and neck supple.  Skin:    General: Skin is warm.  Neurological:     General: No focal deficit present.     Mental Status: He is alert and oriented to person, place, and time.  Psychiatric:        Attention and Perception: Attention normal.        Mood and Affect: Mood normal.        Speech: Speech normal.        Behavior: Behavior normal. Behavior is cooperative.        Thought Content: Thought content normal.        Cognition and Memory: Cognition normal.        Judgment: Judgment normal.          Assessment   1. Poorly controlled type 2 diabetes mellitus ([place])     Tests ordered Orders Placed This Encounter  Procedures  . POCT glucose (manual entry)     Plan: Please see assessment and plan per problem list above.   Meds ordered this encounter  Medications  . OneTouch Delica Lancets 36U MISC    Sig: Four times daily testing  dx e11.65    Dispense:  150 each    Refill:  5    Patient to follow-up in 1 month.  Perlie Mayo, NP

## 2019-09-08 NOTE — ED Triage Notes (Signed)
Pt rolled out of bed last night and hit his head on the nightstand. Swelling to right eye. Pt has history of new onset CHF. Has 3+ pitting edema in extremities. Pt takes Eliquis

## 2019-09-08 NOTE — Patient Instructions (Signed)
I appreciate the opportunity to provide you with care for your health and wellness. Today we discussed: recent fall and blood sugars  Follow up: 1 month   No labs or referrals today  Please go to the ED for scanning of head due to Fall.  Please continue to practice social distancing to keep you, your family, and our community safe.  If you must go out, please wear a mask and practice good handwashing.  It was a pleasure to see you and I look forward to continuing to work together on your health and well-being. Please do not hesitate to call the office if you need care or have questions about your care.  Have a wonderful day and week. With Gratitude, Tereasa Coop, DNP, AGNP-BC

## 2019-09-08 NOTE — ED Provider Notes (Signed)
[facility] EMERGENCY DEPARTMENT Provider Note   CSN: [id number] Arrival date & time: 09/08/19  2951     History Chief Complaint  Patient presents with  . Fall    Stanley Levy is a 64 y.o. male.  HPI Patient presents to the emergency department for evaluation of a fall.  He states that during the night he rolled out of bed hitting his head on the nightstand.  EMS was called to the house and apparently his blood sugar was low, however he and his wife refused transportation at the time.  He was seen his primary care doctor's office this morning where sugar was improved but due to his use of blood thinners she recommended come to the ED for a head CT to rule out intracranial injury.  He is otherwise asymptomatic at this time.  He has multiple other chronic medical conditions all of which are at baseline.    Past Medical History:  Diagnosis Date  . Anxiety   . Diabetes mellitus, type II ([place])   . Hyperlipidemia   . Thyroid disease     Patient Active Problem List   Diagnosis Date Noted  . At high risk for injury related to fall 09/08/2019  . Obesity (BMI 30.0-34.9) 09/08/2019  . Goals of care, counseling/discussion   . Advanced care planning/counseling discussion   . Palliative care by specialist   . Hypothyroidism 08/18/2019  . Cognitive changes/memory Challenges 08/18/2019  . LV (left ventricular) mural thrombus   . Acute systolic heart failure ([place])   . Severe left ventricular systolic dysfunction   . Hyperlipidemia LDL goal <70   . Memory loss 08/16/2019  . Falls frequently 08/16/2019  . Fatigue 08/16/2019  . Essential hypertension 08/16/2019  . Elevated TSH 08/16/2019  . Encounter for hepatitis C screening test for low risk patient 08/16/2019  . Encounter for screening for malignant neoplasm of colon 08/16/2019  . Poorly controlled type 2 diabetes mellitus ([place]) 08/16/2019  . 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 Acute on chronic HFrEF (heart failure with reduced  ejection fraction)/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 % 88/41/6606  . CHF (congestive heart failure) ([place]) 08/15/2019  . Encounter for medication management 08/12/2019  . Hyperglycemia due to diabetes mellitus ([place]) 08/01/2019  . Weakness 08/01/2019  . Subacute confusional state 08/01/2019  . Anxiety disorder 11/25/2011    Past Surgical History:  Procedure Laterality Date  . FOOT SURGERY         Family History  Problem Relation Age of Onset  . Heart attack Father        Near Age 63  . Anxiety disorder Neg Hx   . Dementia Neg Hx   . Alcohol abuse Neg Hx   . Drug abuse Neg Hx   . ADD / ADHD Neg Hx   . Bipolar disorder Neg Hx   . Depression Neg Hx   . OCD Neg Hx   . Paranoid behavior Neg Hx   . Physical abuse Neg Hx   . Schizophrenia Neg Hx   . Seizures Neg Hx   . Sexual abuse Neg Hx     Social History   Tobacco Use  . Smoking status: Never Smoker  . Smokeless tobacco: Never Used  Substance Use Topics  . Alcohol use: No  . Drug use: No    Home Medications Prior to Admission medications   Medication Sig Start Date End Date Taking? Authorizing Provider  acetaminophen (TYLENOL) 325 MG tablet Take 2 tablets (650 mg total) by  mouth every 4 (four) hours as needed for headache or mild pain. 08/23/19   Roxan Hockey, MD  apixaban (ELIQUIS) 5 MG TABS tablet Take 1 tablet (5 mg total) by mouth 2 (two) times daily. 08/23/19   Roxan Hockey, MD  blood glucose meter kit and supplies KIT Dispense based on patient and insurance preference. Use up to four times daily as directed. (FOR ICD-9 250.00, 250.01). 08/11/19   Providence Lanius A, PA-C  blood glucose meter kit and supplies Dispense based on patient and insurance preference. Use up to four times daily as directed. (FOR ICD-10 E10.9, E11.9). 08/02/19   Manuella Ghazi, Pratik D, DO  digoxin (LANOXIN) 0.125 MG tablet Take 1 tablet (0.125 mg total) by mouth daily. 08/24/19   Roxan Hockey, MD  furosemide (LASIX) 20 MG tablet Take 1  tablet (20 mg total) by mouth 2 (two) times daily. Patient taking differently: Take 40 mg by mouth daily. 2 tabs (78m) every am 08/23/19 08/22/20  ERoxan Hockey MD  gabapentin (NEURONTIN) 100 MG capsule Take 1 capsule (100 mg total) by mouth 3 (three) times daily. 08/02/19 08/01/20  SHeath LarkD, DO  glucose blood (GLUCOSE METER TEST) test strip Use as instructed 08/12/19   MPerlie Mayo NP  imipramine (TOFRANIL) 50 MG tablet Take 1 tablet (50 mg total) by mouth at bedtime. Patient not taking: Reported on 09/08/2019 12/01/18   RCloria Spring MD  insulin aspart protamine - aspart (NOVOLOG MIX 70/30 FLEXPEN) (70-30) 100 UNIT/ML FlexPen Inject 0.25 mLs (25 Units total) into the skin 2 (two) times daily with a meal. 08/23/19   Emokpae, Courage, MD  Insulin Pen Needle 31G X 5 MM MISC 1 Units by Does not apply route 2 (two) times daily. 08/02/19   SManuella Ghazi Pratik D, DO  levothyroxine (SYNTHROID) 75 MCG tablet Take 0.5 tablets (37.5 mcg total) by mouth daily before breakfast. 08/24/19   EDenton Brick Courage, MD  lisinopril (ZESTRIL) 2.5 MG tablet Take 1 tablet (2.5 mg total) by mouth daily. 08/23/19 08/22/20  ERoxan Hockey MD  OneTouch Delica Lancets 309GMISC Four times daily testing  dx e11.65 09/08/19   SFayrene Helper MD  rosuvastatin (CRESTOR) 5 MG tablet Take 1 tablet (5 mg total) by mouth every evening. 08/23/19   ERoxan Hockey MD  spironolactone (ALDACTONE) 25 MG tablet Take 0.5 tablets (12.5 mg total) by mouth daily. 08/24/19   ERoxan Hockey MD  tamsulosin (FLOMAX) 0.4 MG CAPS capsule Take 1 capsule (0.4 mg total) by mouth daily after supper. 08/23/19   ERoxan Hockey MD  Vitamin D, Ergocalciferol, (DRISDOL) 1.25 MG (50000 UNIT) CAPS capsule Take 1 capsule (50,000 Units total) by mouth every 7 (seven) days. 08/12/19   MPerlie Mayo NP    Allergies    Patient has no known allergies.  Review of Systems   Review of Systems  Constitutional: Negative for fever.  HENT: Negative for  congestion and sore throat.   Respiratory: Positive for shortness of breath. Negative for cough.   Cardiovascular: Positive for leg swelling. Negative for chest pain.  Gastrointestinal: Negative for abdominal pain, diarrhea, nausea and vomiting.  Genitourinary: Negative for dysuria.  Musculoskeletal: Negative for myalgias.  Skin: Negative for rash.  Neurological: Positive for headaches.  Psychiatric/Behavioral: Negative for behavioral problems.    Physical Exam Updated Vital Signs BP 107/70   Pulse 92   Temp 98.2 F (36.8 C) (Oral)   Resp 12   SpO2 100%   Physical Exam Constitutional:      Appearance:  Normal appearance.  HENT:     Head: Normocephalic.     Comments: Small hematoma to the right eyebrow    Nose: Nose normal.     Mouth/Throat:     Mouth: Mucous membranes are moist.  Eyes:     Extraocular Movements: Extraocular movements intact.     Conjunctiva/sclera: Conjunctivae normal.  Cardiovascular:     Rate and Rhythm: Normal rate.  Pulmonary:     Effort: Pulmonary effort is normal.     Breath sounds: Normal breath sounds.  Abdominal:     General: Abdomen is flat.     Palpations: Abdomen is soft.     Tenderness: There is no abdominal tenderness.  Musculoskeletal:        General: No swelling. Normal range of motion.     Cervical back: Neck supple.  Skin:    General: Skin is warm and dry.  Neurological:     General: No focal deficit present.     Mental Status: He is alert.  Psychiatric:        Mood and Affect: Mood normal.     ED Results / Procedures / Treatments   Labs (all labs ordered are listed, but only abnormal results are displayed) Labs Reviewed  CBG MONITORING, ED    EKG None  Radiology CT Head Wo Contrast  Result Date: 09/08/2019 CLINICAL DATA:  Head trauma EXAM: CT HEAD WITHOUT CONTRAST TECHNIQUE: Contiguous axial images were obtained from the base of the skull through the vertex without intravenous contrast. COMPARISON:  08/01/2019  FINDINGS: Brain: No evidence of acute infarction, hemorrhage, hydrocephalus, extra-axial collection or mass lesion/mass effect. Periventricular and deep white matter hypodensity with encephalomalacia of the left frontal lobe. Vascular: No hyperdense vessel or unexpected calcification. Skull: Normal. Negative for fracture or focal lesion. Sinuses/Orbits: No acute finding. Other: None. IMPRESSION: 1.  No acute intracranial pathology. 2. 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   On: 09/08/2019 12:01    Procedures Procedures (including critical care time)  Medications Ordered in ED Medications - No data to display  ED Course  I have reviewed the triage vital signs and the nursing notes.  Pertinent labs & imaging results that were available during my care of the patient were reviewed by me and considered in my medical decision making (see chart for details).  Clinical Course as of Sep 07 1224  Thu Sep 08, 2019  1224 CT is negative for intracranial injury.  Patient has no other acute complaints in need of emergent evaluation today.   [CS]  2585 Patient be discharged with continued outpatient primary care follow-up.   [CS]    Clinical Course User Index [CS] Truddie Hidden, MD   Final Clinical Impression(s) / ED Diagnoses Final diagnoses:  Injury of head, initial encounter  Fall in home, initial encounter    Rx / DC Orders ED Discharge Orders    None       Truddie Hidden, MD 09/08/19 1226

## 2019-09-09 ENCOUNTER — Telehealth: Payer: Self-pay

## 2019-09-09 NOTE — Telephone Encounter (Signed)
Stanley Levy is calling she said she went to check on Stanley Levy, and he told her that when he lays in the bed that he has trouble breathing, if he is sitting up , its better.

## 2019-09-12 NOTE — Telephone Encounter (Signed)
Cordelia Pen (sister in Social worker) said that he had appt at [facility] tomorrow and he said he was taking all his meds but she will stress it to him again. (fyi- the wife refused the palliative care referral when they called)

## 2019-09-12 NOTE — Progress Notes (Signed)
Cardiology Office Note    Date:  09/13/2019   ID:  [place], Marchiano July 06, 1955, MRN [id number]  PCP:  Perlie Mayo, NP  Cardiologist: Kate Sable, MD    Chief Complaint  Patient presents with  . Hospitalization Follow-up    History of Present Illness:    BEJAMIN HACKBART is a 64 y.o. male with past medical history of HTN, HLD, uncontrolled Type 2 DM, medical noncompliance and history of CVA who presents to the office today for hospital follow-up.   He was recently admitted to [facility] in [facility]/2019 for an acute CHF exacerbation and was found to have a newly diagnosed cardiomyopathy with EF at 20% and global HK. Echo also showed a large LV mural thrombus, measuringapproximately 2.0 x 2.5 cm. He was started on Eliquis over Coumadin for his thrombus given concerns for noncompliance. He diuresed well with Lasix and attempts were made to add Coreg and Lisinopril to his medication regimen but he developed hypotension with those. Options were reviewed with the patient in regards to medical therapy vs. transfer to [facility] for AHF consult and R/LHC. Palliative Care was also following the patient and he did not wish to undergo aggressive treatment at that time but to review options as an outpatient. He was started on Digoxin 0.170m daily along with Lisinopril 2.591mdaily and Spironolactone 12.13m37maily for his cardiomyopathy and IV Lasix was transitioned to Lasix 37m13mD.   In talking with the patient and his sister-in-law today, he reports worsening dyspnea on exertion and orthopnea for the past 2 to 3 weeks. He has not weighed himself on his home scales but this has increased by over 30 pounds within the past 2 weeks. He has experienced worsening lower extremity edema along with swelling along his hands bilaterally. He complains that his pants keep getting soaked within an hour of putting them on due to the weeping along his legs. He denies any associated chest pain or  palpitations.  His sister-in-law brings with them today all of his medications and he has been on these consistently for the past 2 weeks. Denies missing any doses since having them filled (did not initially fill until 3-4 days after hospital discharge).    Past Medical History:  Diagnosis Date  . Anxiety   . CHF (congestive heart failure) (HCC)    a. EF 20% with global HK by echo in 08/2019. Also noted to have LV mural thrombus.   . Diabetes mellitus, type II (HCC)[place]. Hyperlipidemia   . Thyroid disease     Past Surgical History:  Procedure Laterality Date  . FOOT SURGERY      Current Medications: Outpatient Medications Prior to Visit  Medication Sig Dispense Refill  . acetaminophen (TYLENOL) 325 MG tablet Take 2 tablets (650 mg total) by mouth every 4 (four) hours as needed for headache or mild pain. 12 tablet 0  . apixaban (ELIQUIS) 5 MG TABS tablet Take 1 tablet (5 mg total) by mouth 2 (two) times daily. 60 tablet 3  . blood glucose meter kit and supplies KIT Dispense based on patient and insurance preference. Use up to four times daily as directed. (FOR ICD-9 250.00, 250.01). 1 each 0  . blood glucose meter kit and supplies Dispense based on patient and insurance preference. Use up to four times daily as directed. (FOR ICD-10 E10.9, E11.9). 1 each 0  . digoxin (LANOXIN) 0.125 MG tablet Take 1 tablet (0.125 mg total) by mouth daily.  30 tablet 3  . furosemide (LASIX) 20 MG tablet Take 20 mg by mouth 2 (two) times daily.    Marland Kitchen gabapentin (NEURONTIN) 100 MG capsule Take 1 capsule (100 mg total) by mouth 3 (three) times daily. 90 capsule 2  . glucose blood (GLUCOSE METER TEST) test strip Use as instructed 400 each 3  . insulin aspart protamine - aspart (NOVOLOG MIX 70/30 FLEXPEN) (70-30) 100 UNIT/ML FlexPen Inject 0.25 mLs (25 Units total) into the skin 2 (two) times daily with a meal. 15 mL 11  . Insulin Pen Needle 31G X 5 MM MISC 1 Units by Does not apply route 2 (two) times daily.  100 each 5  . levothyroxine (SYNTHROID) 75 MCG tablet Take 0.5 tablets (37.5 mcg total) by mouth daily before breakfast. 15 tablet 3  . lisinopril (ZESTRIL) 2.5 MG tablet Take 1 tablet (2.5 mg total) by mouth daily. 30 tablet 5  . OneTouch Delica Lancets 89H MISC Four times daily testing  dx e11.65 150 each 5  . rosuvastatin (CRESTOR) 5 MG tablet Take 1 tablet (5 mg total) by mouth every evening. 30 tablet 3  . spironolactone (ALDACTONE) 25 MG tablet Take 0.5 tablets (12.5 mg total) by mouth daily. 15 tablet 3  . tamsulosin (FLOMAX) 0.4 MG CAPS capsule Take 1 capsule (0.4 mg total) by mouth daily after supper. 30 capsule 3  . Vitamin D, Ergocalciferol, (DRISDOL) 1.25 MG (50000 UNIT) CAPS capsule Take 1 capsule (50,000 Units total) by mouth every 7 (seven) days. 5 capsule 2  . furosemide (LASIX) 20 MG tablet Take 1 tablet (20 mg total) by mouth 2 (two) times daily. (Patient taking differently: Take 40 mg by mouth daily. 2 tabs (3m) every am) 60 tablet 3  . imipramine (TOFRANIL) 50 MG tablet Take 1 tablet (50 mg total) by mouth at bedtime. (Patient not taking: Reported on 09/08/2019) 90 tablet 3   No facility-administered medications prior to visit.     Allergies:   Patient has no known allergies.   Social History   Socioeconomic History  . Marital status: Married    Spouse name: TMarcellino Fidalgo  . Number of children: Not on file  . Years of education: Not on file  . Highest education level: High school graduate  Occupational History  . Not on file  Tobacco Use  . Smoking status: Never Smoker  . Smokeless tobacco: Never Used  Substance and Sexual Activity  . Alcohol use: No  . Drug use: No  . Sexual activity: Yes  Other Topics Concern  . Not on file  Social History Narrative   Recent left working at [employer] getting sick in start of February      Lives with wife TKarna Christmas married 323(he is her caregiver, she has had strokes)    No children      Beagle: LLorre Nick      Enjoys: weight  lift, yard work, wOceanographer read-       Diet: increasing veggies, was drinking chocolate milk, root beer, and sweet tea, fast food   Caffeine: sweet tea-3 cups of more daily    Water: 8 cups daily       Wears seat belt    Smoke detectors    Does not use phone while driving    Social Determinants of Health   Financial Resource Strain: Low Risk   . Difficulty of Paying Living Expenses: Not hard at all  Food Insecurity: No Food Insecurity  . Worried About  Running Out of Food in the Last Year: Never true  . Ran Out of Food in the Last Year: Never true  Transportation Needs: No Transportation Needs  . Lack of Transportation (Medical): No  . Lack of Transportation (Non-Medical): No  Physical Activity: Inactive  . Days of Exercise per Week: 0 days  . Minutes of Exercise per Session: 0 min  Stress: No Stress Concern Present  . Feeling of Stress : Not at all  Social Connections: Slightly Isolated  . Frequency of Communication with Friends and Family: Twice a week  . Frequency of Social Gatherings with Friends and Family: Twice a week  . Attends Religious Services: 1 to 4 times per year  . Active Member of Clubs or Organizations: No  . Attends Archivist Meetings: Never  . Marital Status: Married     Family History:  The patient's family history includes Heart attack in his father.   Review of Systems:   Please see the history of present illness.     General:  No chills, fever, night sweats or weight changes.  Cardiovascular:  No chest pain, palpitations, paroxysmal nocturnal dyspnea. Positive for dyspnea on exertion, edema and orthopnea.  Dermatological: No rash, lesions/masses Respiratory: No cough, dyspnea Urologic: No hematuria, dysuria Abdominal:   No nausea, vomiting, diarrhea, bright red blood per rectum, melena, or hematemesis Neurologic:  No visual changes, wkns, changes in mental status. All other systems reviewed and are otherwise negative except as noted  above.   Physical Exam:    VS:  BP 134/80   Pulse 64   Temp 98 F (36.7 C)   Ht 5' 9"  (1.753 m)   Wt 241 lb (109.3 kg)   BMI 35.59 kg/m    General: Well developed, well nourished,male appearing in no acute distress. Head: Normocephalic, atraumatic, sclera non-icteric.  Neck: No carotid bruits. JVD at 10 cm.  Lungs: Respirations regular and unlabored, rales along bases bilaterally.  Heart: Regular rate and rhythm. No S3 or S4.  No murmur, no rubs, or gallops appreciated. Abdomen: Soft, non-tender, non-distended. No obvious abdominal masses. Msk:  Strength and tone appear normal for age. No obvious joint deformities or effusions. Extremities: No clubbing or cyanosis. 2+ pitting edema along lower extremities with weeping noted.  Distal pedal pulses are 2+ bilaterally. Neuro: Alert and oriented X 3. Moves all extremities spontaneously. No focal deficits noted. Psych:  Responds to questions appropriately with a normal affect. Skin: No rashes or lesions noted  Wt Readings from Last 3 Encounters:  09/13/19 241 lb (109.3 kg)  09/08/19 234 lb (106.1 kg)  08/23/19 223 lb 1.7 oz (101.2 kg)     Studies/Labs Reviewed:   EKG:  EKG is not ordered today.   Recent Labs: 08/15/2019: B Natriuretic Peptide 1,165.0 08/16/2019: TSH 15.913 08/21/2019: Magnesium 2.0 08/23/2019: ALT 99 08/24/2019: BUN 22; Creatinine, Ser 0.99; Potassium 5.0; Sodium 134 09/13/2019: Hemoglobin 11.8; Platelets 215   Lipid Panel    Component Value Date/Time   CHOL 168 08/11/2019 0859   TRIG 185 (H) 08/11/2019 0859   HDL 56 08/11/2019 0859   CHOLHDL 3.0 08/11/2019 0859   LDLCALC 84 08/11/2019 0859    Additional studies/ records that were reviewed today include:   Echocardiogram: 08/2019 IMPRESSIONS    1. Left ventricular ejection fraction, by estimation, is 20%. The left  ventricle has severely decreased function. The left ventricle demonstrates  global hypokinesis with regional variation.  2. Large LV  mural thrombus in distal anterolateral position,  approximately 2.0 x 2.5 cm (trabecular muscle obscures extent to some  degree). Reported to Dr. Denton Brick.  3. Right ventricular systolic function is moderately reduced. The right  ventricular size is normal. There is mildly elevated pulmonary artery  systolic pressure. The estimated right ventricular systolic pressure is  24.8 mmHg.  4. Left atrial size was mildly dilated.  5. Right atrial size was moderately dilated.  6. The mitral valve is grossly normal. Trivial mitral valve  regurgitation.  7. The aortic valve is tricuspid. Aortic valve regurgitation is not  visualized.  8. The inferior vena cava is normal in size with greater than 50%  respiratory variability, suggesting right atrial pressure of 3 mmHg.  Assessment:    1. Acute systolic heart failure ([place])   2. LV (left ventricular) mural thrombus   3. Essential hypertension   4. Poorly controlled type 2 diabetes mellitus ([place])   5. Hypothyroidism, unspecified type      Plan:   In order of problems listed above:  1. Acute on Chronic Systolic CHF - The patient was recently diagnosed with a new cardiomyopathy and EF at 20% by echocardiogram last month. He was unsure about aggressive work-up versus Palliative Care at that time but has since declined Palliative Care and is interested in advanced therapies. - He is significantly volume overloaded by examination today with elevated JVD, rales on examination and 2+ pitting edema bilaterally. He reports compliance with Lasix 20 mg twice daily but weight has increased by over 30 pounds within the past 2 weeks. - reviewed with Dr. Bronson Ing and will send to the ED for admission. Would anticipate several days of IV diuresis and eventual R/LHC once volume status improves. The patient along with his sister-in-law who was present today are in agreement with this plan. Would recommend starting IV Lasix 40 mg twice daily upon admission  which can be titrated pending response. - continue Digoxin, Lisinopril and Spironolactone. Not on BB given concern for low-output.   2. LV Thrombus - Echo last month also showed a large LV mural thrombus, measuringapproximately 2.0 x 2.5 cm. He was started on Eliquis over Coumadin given concerns for noncompliance.  - He denies any evidence of active bleeding and has not missed any doses of Eliquis for the past 2 weeks. Given the need for cardiac catheterization this admission, would hold Eliquis upon admission and start IV Heparin.    3. HTN - BP is well controlled at 134/80 during today's visit. Continue PTA Digoxin, Lisinopril and Spironolactone as long as renal function remains stable by repeat labs. Will follow BP closely with IV diuresis as he developed hypotension last admission.  4. Uncontrolled IDDM - Hgb A1c > 18.5 in 08/2019. Management per admitting team.   5. Uncontrolled Hypothyroidism - TSH elevated to 15.913 last admission. Continue Synthroid at current dosing and recheck TSH upon admission.    Medication Adjustments/Labs and Tests Ordered: Current medicines are reviewed at length with the patient today.  Concerns regarding medicines are outlined above.  Medication changes, Labs and Tests ordered today are listed in the Patient Instructions below.  Patient sent to [facility] ED.   Signed, Erma Heritage, PA-C  09/13/2019 4:36 PM    [facility] [address] [place], [place] [address] Phone: [phone] Fax: [phone]

## 2019-09-13 ENCOUNTER — Encounter: Payer: Self-pay | Admitting: Student

## 2019-09-13 ENCOUNTER — Other Ambulatory Visit: Payer: Self-pay

## 2019-09-13 ENCOUNTER — Ambulatory Visit (INDEPENDENT_AMBULATORY_CARE_PROVIDER_SITE_OTHER): Payer: PPO | Admitting: Student

## 2019-09-13 ENCOUNTER — Telehealth: Payer: Self-pay

## 2019-09-13 ENCOUNTER — Inpatient Hospital Stay (HOSPITAL_COMMUNITY)
Admission: EM | Admit: 2019-09-13 | Discharge: 2019-09-22 | DRG: 286 | Disposition: A | Discharge status: Home / Self Care | Payer: PPO | Source: Ambulatory Visit | Attending: Cardiology | Admitting: Cardiology

## 2019-09-13 ENCOUNTER — Encounter (HOSPITAL_COMMUNITY): Payer: Self-pay | Admitting: *Deleted

## 2019-09-13 ENCOUNTER — Emergency Department (HOSPITAL_COMMUNITY): Payer: PPO

## 2019-09-13 VITALS — BP 134/80 | HR 64 | Temp 98.0°F | Ht 69.0 in | Wt 241.0 lb

## 2019-09-13 DIAGNOSIS — Z8249 Family history of ischemic heart disease and other diseases of the circulatory system: Secondary | ICD-10-CM

## 2019-09-13 DIAGNOSIS — Z79899 Other long term (current) drug therapy: Secondary | ICD-10-CM

## 2019-09-13 DIAGNOSIS — E11649 Type 2 diabetes mellitus with hypoglycemia without coma: Secondary | ICD-10-CM | POA: Diagnosis not present

## 2019-09-13 DIAGNOSIS — R296 Repeated falls: Secondary | ICD-10-CM | POA: Diagnosis not present

## 2019-09-13 DIAGNOSIS — I493 Ventricular premature depolarization: Secondary | ICD-10-CM | POA: Diagnosis present

## 2019-09-13 DIAGNOSIS — E785 Hyperlipidemia, unspecified: Secondary | ICD-10-CM | POA: Diagnosis not present

## 2019-09-13 DIAGNOSIS — Z9114 Patient's other noncompliance with medication regimen: Secondary | ICD-10-CM

## 2019-09-13 DIAGNOSIS — R008 Other abnormalities of heart beat: Secondary | ICD-10-CM | POA: Diagnosis not present

## 2019-09-13 DIAGNOSIS — I5043 Acute on chronic combined systolic (congestive) and diastolic (congestive) heart failure: Secondary | ICD-10-CM | POA: Diagnosis not present

## 2019-09-13 DIAGNOSIS — I10 Essential (primary) hypertension: Secondary | ICD-10-CM | POA: Diagnosis present

## 2019-09-13 DIAGNOSIS — R0602 Shortness of breath: Secondary | ICD-10-CM | POA: Diagnosis not present

## 2019-09-13 DIAGNOSIS — E1165 Type 2 diabetes mellitus with hyperglycemia: Secondary | ICD-10-CM | POA: Diagnosis not present

## 2019-09-13 DIAGNOSIS — E039 Hypothyroidism, unspecified: Secondary | ICD-10-CM

## 2019-09-13 DIAGNOSIS — R7989 Other specified abnormal findings of blood chemistry: Secondary | ICD-10-CM | POA: Diagnosis present

## 2019-09-13 DIAGNOSIS — I509 Heart failure, unspecified: Secondary | ICD-10-CM

## 2019-09-13 DIAGNOSIS — Z8673 Personal history of transient ischemic attack (TIA), and cerebral infarction without residual deficits: Secondary | ICD-10-CM | POA: Diagnosis not present

## 2019-09-13 DIAGNOSIS — I5023 Acute on chronic systolic (congestive) heart failure: Secondary | ICD-10-CM | POA: Diagnosis not present

## 2019-09-13 DIAGNOSIS — I428 Other cardiomyopathies: Secondary | ICD-10-CM | POA: Diagnosis not present

## 2019-09-13 DIAGNOSIS — W19XXXA Unspecified fall, initial encounter: Secondary | ICD-10-CM | POA: Diagnosis present

## 2019-09-13 DIAGNOSIS — Z794 Long term (current) use of insulin: Secondary | ICD-10-CM | POA: Diagnosis not present

## 2019-09-13 DIAGNOSIS — Z79890 Hormone replacement therapy (postmenopausal): Secondary | ICD-10-CM

## 2019-09-13 DIAGNOSIS — F419 Anxiety disorder, unspecified: Secondary | ICD-10-CM | POA: Diagnosis present

## 2019-09-13 DIAGNOSIS — R946 Abnormal results of thyroid function studies: Secondary | ICD-10-CM | POA: Diagnosis present

## 2019-09-13 DIAGNOSIS — I420 Dilated cardiomyopathy: Secondary | ICD-10-CM | POA: Diagnosis not present

## 2019-09-13 DIAGNOSIS — I272 Pulmonary hypertension, unspecified: Secondary | ICD-10-CM | POA: Diagnosis present

## 2019-09-13 DIAGNOSIS — I5021 Acute systolic (congestive) heart failure: Secondary | ICD-10-CM

## 2019-09-13 DIAGNOSIS — T383X5A Adverse effect of insulin and oral hypoglycemic [antidiabetic] drugs, initial encounter: Secondary | ICD-10-CM | POA: Diagnosis not present

## 2019-09-13 DIAGNOSIS — Y92239 Unspecified place in hospital as the place of occurrence of the external cause: Secondary | ICD-10-CM | POA: Diagnosis not present

## 2019-09-13 DIAGNOSIS — Z20822 Contact with and (suspected) exposure to covid-19: Secondary | ICD-10-CM | POA: Diagnosis present

## 2019-09-13 DIAGNOSIS — I110 Hypertensive heart disease with heart failure: Secondary | ICD-10-CM | POA: Diagnosis not present

## 2019-09-13 DIAGNOSIS — Z9119 Patient's noncompliance with other medical treatment and regimen: Secondary | ICD-10-CM | POA: Diagnosis not present

## 2019-09-13 DIAGNOSIS — E782 Mixed hyperlipidemia: Secondary | ICD-10-CM | POA: Diagnosis present

## 2019-09-13 DIAGNOSIS — Z66 Do not resuscitate: Secondary | ICD-10-CM | POA: Diagnosis not present

## 2019-09-13 DIAGNOSIS — L89623 Pressure ulcer of left heel, stage 3: Secondary | ICD-10-CM | POA: Diagnosis not present

## 2019-09-13 DIAGNOSIS — Z7901 Long term (current) use of anticoagulants: Secondary | ICD-10-CM

## 2019-09-13 DIAGNOSIS — I513 Intracardiac thrombosis, not elsewhere classified: Secondary | ICD-10-CM

## 2019-09-13 DIAGNOSIS — I519 Heart disease, unspecified: Secondary | ICD-10-CM | POA: Diagnosis not present

## 2019-09-13 DIAGNOSIS — L89613 Pressure ulcer of right heel, stage 3: Secondary | ICD-10-CM | POA: Diagnosis present

## 2019-09-13 DIAGNOSIS — L8990 Pressure ulcer of unspecified site, unspecified stage: Secondary | ICD-10-CM | POA: Insufficient documentation

## 2019-09-13 HISTORY — DX: Acute on chronic combined systolic (congestive) and diastolic (congestive) heart failure: I50.43

## 2019-09-13 LAB — SARS CORONAVIRUS 2 (TAT 6-24 HRS): SARS Coronavirus 2: NEGATIVE

## 2019-09-13 LAB — COMPREHENSIVE METABOLIC PANEL
ALT: 41 U/L (ref 0–44)
AST: 26 U/L (ref 15–41)
Albumin: 3 g/dL — ABNORMAL LOW (ref 3.5–5.0)
Alkaline Phosphatase: 362 U/L — ABNORMAL HIGH (ref 38–126)
Anion gap: 9 (ref 5–15)
BUN: 20 mg/dL (ref 8–23)
CO2: 25 mmol/L (ref 22–32)
Calcium: 8.4 mg/dL — ABNORMAL LOW (ref 8.9–10.3)
Chloride: 101 mmol/L (ref 98–111)
Creatinine, Ser: 0.9 mg/dL (ref 0.61–1.24)
GFR calc Af Amer: >60 mL/min (ref >60)
GFR calc non Af Amer: >60 mL/min (ref >60)
Glucose, Bld: 297 mg/dL — ABNORMAL HIGH (ref 70–99)
Potassium: 3.6 mmol/L (ref 3.5–5.1)
Sodium: 135 mmol/L (ref 135–145)
Total Bilirubin: 0.8 mg/dL (ref 0.3–1.2)
Total Protein: 6.3 g/dL — ABNORMAL LOW (ref 6.5–8.1)

## 2019-09-13 LAB — CBC WITH DIFFERENTIAL/PLATELET
Abs Immature Granulocytes: 0.05 10*3/uL (ref 0.00–0.07)
Basophils Absolute: 0 10*3/uL (ref 0.0–0.1)
Basophils Relative: 1 %
Eosinophils Absolute: 0 10*3/uL (ref 0.0–0.5)
Eosinophils Relative: 1 %
HCT: 37.4 % — ABNORMAL LOW (ref 39.0–52.0)
Hemoglobin: 11.8 g/dL — ABNORMAL LOW (ref 13.0–17.0)
Immature Granulocytes: 1 %
Lymphocytes Relative: 20 %
Lymphs Abs: 1.1 10*3/uL (ref 0.7–4.0)
MCH: 31.9 pg (ref 26.0–34.0)
MCHC: 31.6 g/dL (ref 30.0–36.0)
MCV: 101.1 fL — ABNORMAL HIGH (ref 80.0–100.0)
Monocytes Absolute: 0.6 10*3/uL (ref 0.1–1.0)
Monocytes Relative: 11 %
Neutro Abs: 3.7 10*3/uL (ref 1.7–7.7)
Neutrophils Relative %: 66 %
Platelets: 215 10*3/uL (ref 150–400)
RBC: 3.7 MIL/uL — ABNORMAL LOW (ref 4.22–5.81)
RDW: 15.7 % — ABNORMAL HIGH (ref 11.5–15.5)
WBC: 5.5 10*3/uL (ref 4.0–10.5)
nRBC: 0 % (ref 0.0–0.2)

## 2019-09-13 LAB — GLUCOSE, CAPILLARY
Glucose-Capillary: 203 mg/dL — ABNORMAL HIGH (ref 70–99)
Glucose-Capillary: 196 mg/dL — ABNORMAL HIGH (ref 70–99)

## 2019-09-13 LAB — TROPONIN I (HIGH SENSITIVITY)
Troponin I (High Sensitivity): 20 ng/L — ABNORMAL HIGH (ref <18)
Troponin I (High Sensitivity): 25 ng/L — ABNORMAL HIGH (ref <18)

## 2019-09-13 LAB — DIGOXIN LEVEL: Digoxin Level: 0.5 ng/mL — ABNORMAL LOW (ref 0.8–2.0)

## 2019-09-13 LAB — APTT: aPTT: 36 seconds (ref 24–36)

## 2019-09-13 LAB — HEPARIN LEVEL (UNFRACTIONATED): Heparin Unfractionated: 1.75 IU/mL — ABNORMAL HIGH (ref 0.30–0.70)

## 2019-09-13 LAB — BRAIN NATRIURETIC PEPTIDE: B Natriuretic Peptide: 1082 pg/mL — ABNORMAL HIGH (ref 0.0–100.0)

## 2019-09-13 MED ORDER — ACETAMINOPHEN 325 MG PO TABS
650.0000 mg | ORAL_TABLET | Freq: Four times a day (QID) | ORAL | Status: DC | PRN
  Filled 2019-09-13: qty 2

## 2019-09-13 MED ORDER — FUROSEMIDE 10 MG/ML IJ SOLN
40.0000 mg | Freq: Once | INTRAMUSCULAR | Status: AC
  Administered 2019-09-13: 40 mg via INTRAVENOUS
  Filled 2019-09-13: qty 4

## 2019-09-13 MED ORDER — SPIRONOLACTONE 25 MG PO TABS
12.5000 mg | ORAL_TABLET | Freq: Every day | ORAL | Status: DC
  Administered 2019-09-14 – 2019-09-19 (×6): 12.5 mg via ORAL
  Filled 2019-09-13 (×10): qty 1

## 2019-09-13 MED ORDER — DIGOXIN 125 MCG PO TABS
0.1250 mg | ORAL_TABLET | Freq: Every day | ORAL | Status: DC
  Administered 2019-09-14 – 2019-09-22 (×9): 0.125 mg via ORAL
  Filled 2019-09-13 (×9): qty 1

## 2019-09-13 MED ORDER — INSULIN ASPART 100 UNIT/ML ~~LOC~~ SOLN: 0.0000 [IU] | Freq: Every day | SUBCUTANEOUS | Status: DC

## 2019-09-13 MED ORDER — TAMSULOSIN HCL 0.4 MG PO CAPS
0.4000 mg | ORAL_CAPSULE | Freq: Every day | ORAL | Status: DC
  Administered 2019-09-13 – 2019-09-21 (×9): 0.4 mg via ORAL
  Filled 2019-09-13 (×9): qty 1

## 2019-09-13 MED ORDER — FUROSEMIDE 10 MG/ML IJ SOLN
40.0000 mg | Freq: Two times a day (BID) | INTRAMUSCULAR | Status: DC
  Administered 2019-09-14: 40 mg via INTRAVENOUS
  Filled 2019-09-13: qty 4

## 2019-09-13 MED ORDER — INSULIN ASPART PROT & ASPART (70-30 MIX) 100 UNIT/ML ~~LOC~~ SUSP
25.0000 [IU] | Freq: Two times a day (BID) | SUBCUTANEOUS | Status: DC
  Administered 2019-09-13: 25 [IU] via SUBCUTANEOUS
  Filled 2019-09-13 (×2): qty 10

## 2019-09-13 MED ORDER — ROSUVASTATIN CALCIUM 5 MG PO TABS
5.0000 mg | ORAL_TABLET | Freq: Every evening | ORAL | Status: DC
  Administered 2019-09-13 – 2019-09-21 (×9): 5 mg via ORAL
  Filled 2019-09-13 (×9): qty 1

## 2019-09-13 MED ORDER — HEPARIN (PORCINE) 25000 UT/250ML-% IV SOLN
1100.0000 [IU]/h | INTRAVENOUS | Status: DC
  Administered 2019-09-13: 1450 [IU]/h via INTRAVENOUS
  Administered 2019-09-14 – 2019-09-15 (×2): 1200 [IU]/h via INTRAVENOUS
  Administered 2019-09-16 – 2019-09-20 (×5): 1100 [IU]/h via INTRAVENOUS
  Filled 2019-09-13 (×7): qty 250

## 2019-09-13 MED ORDER — ONDANSETRON HCL 4 MG PO TABS: 4.0000 mg | ORAL_TABLET | Freq: Four times a day (QID) | ORAL | Status: DC | PRN

## 2019-09-13 MED ORDER — INSULIN ASPART PROT & ASPART (70-30 MIX) 100 UNIT/ML PEN
25.0000 [IU] | PEN_INJECTOR | Freq: Two times a day (BID) | SUBCUTANEOUS | Status: DC
  Filled 2019-09-13: qty 3

## 2019-09-13 MED ORDER — INSULIN ASPART 100 UNIT/ML ~~LOC~~ SOLN: 0.0000 [IU] | Freq: Three times a day (TID) | SUBCUTANEOUS | Status: DC

## 2019-09-13 MED ORDER — LISINOPRIL 5 MG PO TABS
2.5000 mg | ORAL_TABLET | Freq: Every day | ORAL | Status: DC
  Administered 2019-09-14: 2.5 mg via ORAL
  Filled 2019-09-13: qty 1

## 2019-09-13 MED ORDER — ONDANSETRON HCL 4 MG/2ML IJ SOLN: 4.0000 mg | Freq: Four times a day (QID) | INTRAMUSCULAR | Status: DC | PRN

## 2019-09-13 MED ORDER — POLYETHYLENE GLYCOL 3350 17 G PO PACK: 17.0000 g | PACK | Freq: Every day | ORAL | Status: DC | PRN

## 2019-09-13 MED ORDER — GABAPENTIN 100 MG PO CAPS
100.0000 mg | ORAL_CAPSULE | Freq: Three times a day (TID) | ORAL | Status: DC
  Administered 2019-09-13 – 2019-09-22 (×26): 100 mg via ORAL
  Filled 2019-09-13 (×25): qty 1

## 2019-09-13 MED ORDER — LEVOTHYROXINE SODIUM 75 MCG PO TABS
37.5000 ug | ORAL_TABLET | Freq: Every day | ORAL | Status: DC
  Administered 2019-09-14 – 2019-09-22 (×9): 37.5 ug via ORAL
  Filled 2019-09-13 (×9): qty 1

## 2019-09-13 MED ORDER — ACETAMINOPHEN 650 MG RE SUPP: 650.0000 mg | Freq: Four times a day (QID) | RECTAL | Status: DC | PRN

## 2019-09-13 NOTE — H&P (Addendum)
History and Physical    Stanley Levy TML:[id number] DOB: May 06, 1956 DOA: 09/13/2019  PCP: Perlie Mayo, NP   Patient coming from: Home  I have personally briefly reviewed patient's old medical records in [place]  Chief Complaint: Diffisculty breathing, leg swelling  HPI: Stanley Levy is a 64 y.o. male with medical history significant for systolic congestive heart failure, hypertension, diabetes mellitus, history of noncompliance.  Patient presented today to his cardiologist office today with his sister-in-law, she reported dyspnea and orthopnea over the past 2 to 3 weeks with lower extremity swelling.  Patient also reported swelling and weeping of his bilateral lower extremities and swelling of his hands.  He reports compliance with all his medications including Lasix and Eliquis.  Recent hospitalization 2/15-2/23 for new onset cardiomyopathy/systolic and diastolic CHF, EF 68%, and large left ventricular mural thrombus.  Patient was discharged on Lasix 20 mg 12 hourly, and Eliquis.  Palliative care was consulted.  Beta-blockers could not be added due to hypotension.  Patient was discharged home with home health and palliative care services.  ED Course: Heart rate ranging from 40s to 90s, blood pressure systolic 127N to 170Y, tachypneic, but O2 sats  95 - 100 % on room air.  BNP elevated at 1082.  HST 20.  Digoxin level 0.5.  Portable chest x-ray showing cardiomegaly, vascular congestion, small R > L effusions.  IV Lasix 40 mg x 1 given in ED.  Hospitalist to admit for decompensated CHF.  Review of Systems: As per HPI all other systems reviewed and negative.  Past Medical History:  Diagnosis Date  . Anxiety   . CHF (congestive heart failure) (HCC)    a. EF 20% with global HK by echo in 08/2019. Also noted to have LV mural thrombus.   . Diabetes mellitus, type II ([place])   . Hyperlipidemia   . Thyroid disease     Past Surgical History:  Procedure Laterality Date  . FOOT  SURGERY       reports that he has never smoked. He has never used smokeless tobacco. He reports that he does not drink alcohol or use drugs.  No Known Allergies  Family History  Problem Relation Age of Onset  . Heart attack Father        Near Age 75  . Anxiety disorder Neg Hx   . Dementia Neg Hx   . Alcohol abuse Neg Hx   . Drug abuse Neg Hx   . ADD / ADHD Neg Hx   . Bipolar disorder Neg Hx   . Depression Neg Hx   . OCD Neg Hx   . Paranoid behavior Neg Hx   . Physical abuse Neg Hx   . Schizophrenia Neg Hx   . Seizures Neg Hx   . Sexual abuse Neg Hx     Prior to Admission medications   Medication Sig Start Date End Date Taking? Authorizing Provider  acetaminophen (TYLENOL) 325 MG tablet Take 2 tablets (650 mg total) by mouth every 4 (four) hours as needed for headache or mild pain. 08/23/19  Yes Janin Kozlowski, Courage, MD  apixaban (ELIQUIS) 5 MG TABS tablet Take 1 tablet (5 mg total) by mouth 2 (two) times daily. 08/23/19  Yes Roxan Hockey, MD  blood glucose meter kit and supplies KIT Dispense based on patient and insurance preference. Use up to four times daily as directed. (FOR ICD-9 250.00, 250.01). 08/11/19  Yes Providence Lanius A, PA-C  blood glucose meter kit and supplies Dispense  based on patient and insurance preference. Use up to four times daily as directed. (FOR ICD-10 E10.9, E11.9). 08/02/19  Yes Shah, Pratik D, DO  digoxin (LANOXIN) 0.125 MG tablet Take 1 tablet (0.125 mg total) by mouth daily. 08/24/19  Yes Jayonna Meyering, Courage, MD  furosemide (LASIX) 20 MG tablet Take 20 mg by mouth 2 (two) times daily.   Yes [provider]  gabapentin (NEURONTIN) 100 MG capsule Take 1 capsule (100 mg total) by mouth 3 (three) times daily. 08/02/19 08/01/20 Yes Manuella Ghazi, Pratik D, DO  glucose blood (GLUCOSE METER TEST) test strip Use as instructed 08/12/19  Yes Perlie Mayo, NP  insulin aspart protamine - aspart (NOVOLOG MIX 70/30 FLEXPEN) (70-30) 100 UNIT/ML FlexPen Inject 0.25 mLs (25  Units total) into the skin 2 (two) times daily with a meal. 08/23/19  Yes Vauda Salvucci, Courage, MD  Insulin Pen Needle 31G X 5 MM MISC 1 Units by Does not apply route 2 (two) times daily. 08/02/19  Yes Manuella Ghazi, Pratik D, DO  levothyroxine (SYNTHROID) 75 MCG tablet Take 0.5 tablets (37.5 mcg total) by mouth daily before breakfast. 08/24/19  Yes Jocilyn Trego, Courage, MD  lisinopril (ZESTRIL) 2.5 MG tablet Take 1 tablet (2.5 mg total) by mouth daily. 08/23/19 08/22/20 Yes Roxan Hockey, MD  OneTouch Delica Lancets 64P MISC Four times daily testing  dx e11.65 09/08/19  Yes Fayrene Helper, MD  rosuvastatin (CRESTOR) 5 MG tablet Take 1 tablet (5 mg total) by mouth every evening. 08/23/19  Yes Morrissa Shein, Courage, MD  spironolactone (ALDACTONE) 25 MG tablet Take 0.5 tablets (12.5 mg total) by mouth daily. 08/24/19  Yes Efrata Brunner, Courage, MD  tamsulosin (FLOMAX) 0.4 MG CAPS capsule Take 1 capsule (0.4 mg total) by mouth daily after supper. 08/23/19  Yes Camary Sosa, Courage, MD  Vitamin D, Ergocalciferol, (DRISDOL) 1.25 MG (50000 UNIT) CAPS capsule Take 1 capsule (50,000 Units total) by mouth every 7 (seven) days. 08/12/19  Yes Perlie Mayo, NP    Physical Exam: Vitals:   09/13/19 1640 09/13/19 1645 09/13/19 1648 09/13/19 1700  BP:    115/87  Pulse: (!) 45 (!) 48 86 (!) 41  Resp: (!) 22 (!) 27 17 (!) 25  Temp:      TempSrc:      SpO2: 98% 100% 100% 95%  Height:        Constitutional: NAD, calm, comfortable Vitals:   09/13/19 1640 09/13/19 1645 09/13/19 1648 09/13/19 1700  BP:    115/87  Pulse: (!) 45 (!) 48 86 (!) 41  Resp: (!) 22 (!) 27 17 (!) 25  Temp:      TempSrc:      SpO2: 98% 100% 100% 95%  Height:       Eyes: PERRL, lids and conjunctivae normal ENMT: Mucous membranes are moist. Posterior pharynx clear of any exudate or lesions.  Neck: normal, supple, no masses, no thyromegaly Respiratory:  Normal respiratory effort. No accessory muscle use.  Cardiovascular: Regular rate and rhythm, no murmurs /  rubs / gallops. 3 + pitting edema bilaterally to thighs, swelling of bilateral upper extremities,. 2+ pedal pulses.   Abdomen: distended, no tenderness, no masses palpated. No hepatosplenomegaly. Bowel sounds positive.  Musculoskeletal: no clubbing / cyanosis. No joint deformity upper and lower extremities. Good ROM, no contractures. Normal muscle tone.  Skin: with reddish papules and mild streaks symmetric on bilateral lower extremities, patient states this occurs when his legs are swollen and improves after diuresis, no ulcers. No induration Neurologic: No apparent cranial nerve  abnormality, moving all extremities spontaneously. Psychiatric: Normal judgment and insight. Alert and oriented x 3. Normal mood.   Labs on Admission: I have personally reviewed following labs and imaging studies  CBC: Recent Labs  Lab 09/13/19 1600  WBC 5.5  NEUTROABS 3.7  HGB 11.8*  HCT 37.4*  MCV 101.1*  PLT 169   Basic Metabolic Panel: Recent Labs  Lab 09/13/19 1600  NA 135  K 3.6  CL 101  CO2 25  GLUCOSE 297*  BUN 20  CREATININE 0.90  CALCIUM 8.4*   Liver Function Tests: Recent Labs  Lab 09/13/19 1600  AST 26  ALT 41  ALKPHOS 362*  BILITOT 0.8  PROT 6.3*  ALBUMIN 3.0*   CBG: Recent Labs  Lab 09/08/19 1108  GLUCAP 79   Urine analysis:    Component Value Date/Time   COLORURINE YELLOW 08/11/2019 1840   APPEARANCEUR CLEAR 08/11/2019 1840   LABSPEC 1.024 08/11/2019 1840   PHURINE 5.0 08/11/2019 1840   GLUCOSEU >=500 (A) 08/11/2019 1840   HGBUR NEGATIVE 08/11/2019 1840   BILIRUBINUR NEGATIVE 08/11/2019 1840   KETONESUR NEGATIVE 08/11/2019 1840   PROTEINUR NEGATIVE 08/11/2019 1840   NITRITE NEGATIVE 08/11/2019 1840   LEUKOCYTESUR NEGATIVE 08/11/2019 1840    Radiological Exams on Admission: DG Chest Port 1 View  Result Date: 09/13/2019 CLINICAL DATA:  Short of breath EXAM: PORTABLE CHEST 1 VIEW COMPARISON:  08/24/2019, CT 08/15/2019 FINDINGS: Small right greater than left  pleural effusions with possible loculation on the right. Cardiomegaly with vascular congestion. Patchy airspace opacity at the left base. No pneumothorax. IMPRESSION: 1. Cardiomegaly with vascular congestion and small right greater than left pleural effusions. 2. Patchy atelectasis or pneumonia at the left base. Electronically Signed   By: Donavan Foil M.D.   On: 09/13/2019 16:08    EKG: Independently reviewed.  Sinus tachycardia rate is 100, QTc 430 nonspecific abnormalities in 1 and aVL no significant change from prior EKG.  Assessment/Plan Active Problems:   Acute on chronic systolic (congestive) heart failure (HCC)   Acute on chronic systolic heart failure- weight on discharge from hospital 2/23- 223, weight today 240lbs, 3+ extremity edema to thighs, upper extremities swollen, BNP elevated 1082 consistent with prior elevation for decompensated CHF.  O2 sats greater than 95% on room air. Chest x-ray shows cardiomegaly and vascular congestion small R > L effusions.  Reports compliance with Lasix 20 mg every 12 hourly.  Cardiomyopathy diagnosed on recent hospitalization, echo 2/16 EF 20%.  Decision not to pursue further cardiac work-up due to patient's history of notoriously not being compliant.  Beta-blocker ACE inhibitor also held due to hypotension. -Evaluated by cardiology prior to ED admit, will follow recommendations, Would anticipate several days of IV diuresis and eventual R/LHC once volume status improves. -Continue Lasix 40 every 12 hourly -Strict input output, daily weight -Fluid restriction - Resume home digoxin, lisinopril 2.5 and spironolactone. -Daily BMP  Intracardiac Clot/Large LV thrombus - cardiology recommending IV heparin while hospitalized, rather than Eliquis  Elevated troponin- 20 > 25, similar to prior likely demand ischemia versus troponin leak from cardiomyopathy.  Denies chest pain.  H/o Prior CVA- patient has baseline mild memory/cognitive concerns. MRI Brain  from 08/02/19--- with Chronic Lt Frontal Lobe encephalomalacia (remote infarct)-- -aspirin discontinued on recent hospitalization in favor of Eliquis.  IV heparin for now. -Resume Crestor  DM2- A1C is 18.5,reflecting uncontrolled DM.  Random glucose today 297. - Resume home 70/30 at home dose 25u BID - SSI- mod  Hypothyroidism-- recentTSH is  15.9. -Resume home Synthroid.   DVT prophylaxis: Heparin Code Status: DNR, consistent with prior documentation, patient re-confirmed at bedside. Family Communication: None at bedside Disposition Plan: > 2 days Consults called: Cardiology Admission status: Inpatient, telemetry I certify that at the point of admission it is my clinical judgment that the patient will require inpatient hospital care spanning beyond 2 midnights from the point of admission due to high intensity of service, high risk for further deterioration and high frequency of surveillance required. The following factors support the patient status of inpatient: Markedly volume overloaded will require several days of IV diuresis.   Bethena Roys MD [facility]  09/13/2019, 7:16 PM

## 2019-09-13 NOTE — Telephone Encounter (Signed)
fyi

## 2019-09-13 NOTE — ED Provider Notes (Signed)
[facility] EMERGENCY DEPARTMENT Provider Note   CSN: [id number] Arrival date & time: 09/13/19  1511     History Chief Complaint  Patient presents with  . Shortness of Breath    Stanley Levy is a 64 y.o. male.  Stanley Levy is a 64 y.o. male with a history of CHF, cardiomyopathy, diabetes, hypertension, hyperlipidemia, thyroid disease, who presents to the ED from cardiology office for evaluation of shortness of breath and fluid overload.  Patient reports that he has had a 30 pound weight gain over the past 2 weeks despite taking his Lasix regularly.  He reports worsening swelling all the way up to the thighs on both legs.  He reports legs have been weeping and when he wakes up every morning his pants and sheets are wet.  He reports worsening shortness of breath.  Shortness of breath is worse with exertion and is also worse when he tries to lay flat.  He denies any associated chest pain.  No lightheadedness or syncope.  No fevers or chills.  No cough.  No associated abdominal pain or distention.  No vomiting or diarrhea.  Patient states he has been taking all of his medications regularly.          Past Medical History:  Diagnosis Date  . Anxiety   . CHF (congestive heart failure) (HCC)    a. EF 20% with global HK by echo in 08/2019. Also noted to have LV mural thrombus.   . Diabetes mellitus, type II ([place])   . Hyperlipidemia   . Thyroid disease     Patient Active Problem List   Diagnosis Date Noted  . Acute on chronic systolic (congestive) heart failure ([place]) 09/13/2019  . At high risk for injury related to fall 09/08/2019  . Obesity (BMI 30.0-34.9) 09/08/2019  . Goals of care, counseling/discussion   . Advanced care planning/counseling discussion   . Palliative care by specialist   . Hypothyroidism 08/18/2019  . Cognitive changes/memory Challenges 08/18/2019  . LV (left ventricular) mural thrombus   . Acute systolic heart failure ([place])   . Severe left ventricular  systolic dysfunction   . Hyperlipidemia LDL goal <70   . Memory loss 08/16/2019  . Falls frequently 08/16/2019  . Fatigue 08/16/2019  . Essential hypertension 08/16/2019  . Elevated TSH 08/16/2019  . Encounter for hepatitis C screening test for low risk patient 08/16/2019  . Encounter for screening for malignant neoplasm of colon 08/16/2019  . Poorly controlled type 2 diabetes mellitus ([place]) 08/16/2019  . 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 % 52/84/1324  . CHF (congestive heart failure) ([place]) 08/15/2019  . Encounter for medication management 08/12/2019  . Hyperglycemia due to diabetes mellitus ([place]) 08/01/2019  . Weakness 08/01/2019  . Subacute confusional state 08/01/2019  . Anxiety disorder 11/25/2011    Past Surgical History:  Procedure Laterality Date  . FOOT SURGERY         Family History  Problem Relation Age of Onset  . Heart attack Father        Near Age 66  . Anxiety disorder Neg Hx   . Dementia Neg Hx   . Alcohol abuse Neg Hx   . Drug abuse Neg Hx   . ADD / ADHD Neg Hx   . Bipolar disorder Neg Hx   . Depression Neg Hx   . OCD Neg Hx   . Paranoid behavior Neg Hx   .  Physical abuse Neg Hx   . Schizophrenia Neg Hx   . Seizures Neg Hx   . Sexual abuse Neg Hx     Social History   Tobacco Use  . Smoking status: Never Smoker  . Smokeless tobacco: Never Used  Substance Use Topics  . Alcohol use: No  . Drug use: No    Home Medications Prior to Admission medications   Medication Sig Start Date End Date Taking? Authorizing Provider  acetaminophen (TYLENOL) 325 MG tablet Take 2 tablets (650 mg total) by mouth every 4 (four) hours as needed for headache or mild pain. 08/23/19  Yes Emokpae, Courage, MD  apixaban (ELIQUIS) 5 MG TABS tablet Take 1 tablet (5 mg total) by mouth 2 (two) times daily. 08/23/19  Yes Roxan Hockey, MD  blood glucose meter  kit and supplies KIT Dispense based on patient and insurance preference. Use up to four times daily as directed. (FOR ICD-9 250.00, 250.01). 08/11/19  Yes Providence Lanius A, PA-C  blood glucose meter kit and supplies Dispense based on patient and insurance preference. Use up to four times daily as directed. (FOR ICD-10 E10.9, E11.9). 08/02/19  Yes Shah, Pratik D, DO  digoxin (LANOXIN) 0.125 MG tablet Take 1 tablet (0.125 mg total) by mouth daily. 08/24/19  Yes Emokpae, Courage, MD  furosemide (LASIX) 20 MG tablet Take 20 mg by mouth 2 (two) times daily.   Yes [provider]  gabapentin (NEURONTIN) 100 MG capsule Take 1 capsule (100 mg total) by mouth 3 (three) times daily. 08/02/19 08/01/20 Yes Manuella Ghazi, Pratik D, DO  glucose blood (GLUCOSE METER TEST) test strip Use as instructed 08/12/19  Yes Perlie Mayo, NP  insulin aspart protamine - aspart (NOVOLOG MIX 70/30 FLEXPEN) (70-30) 100 UNIT/ML FlexPen Inject 0.25 mLs (25 Units total) into the skin 2 (two) times daily with a meal. 08/23/19  Yes Emokpae, Courage, MD  Insulin Pen Needle 31G X 5 MM MISC 1 Units by Does not apply route 2 (two) times daily. 08/02/19  Yes Manuella Ghazi, Pratik D, DO  levothyroxine (SYNTHROID) 75 MCG tablet Take 0.5 tablets (37.5 mcg total) by mouth daily before breakfast. 08/24/19  Yes Emokpae, Courage, MD  lisinopril (ZESTRIL) 2.5 MG tablet Take 1 tablet (2.5 mg total) by mouth daily. 08/23/19 08/22/20 Yes Roxan Hockey, MD  OneTouch Delica Lancets 01E MISC Four times daily testing  dx e11.65 09/08/19  Yes Fayrene Helper, MD  rosuvastatin (CRESTOR) 5 MG tablet Take 1 tablet (5 mg total) by mouth every evening. 08/23/19  Yes Emokpae, Courage, MD  spironolactone (ALDACTONE) 25 MG tablet Take 0.5 tablets (12.5 mg total) by mouth daily. 08/24/19  Yes Emokpae, Courage, MD  tamsulosin (FLOMAX) 0.4 MG CAPS capsule Take 1 capsule (0.4 mg total) by mouth daily after supper. 08/23/19  Yes Emokpae, Courage, MD  Vitamin D, Ergocalciferol, (DRISDOL)  1.25 MG (50000 UNIT) CAPS capsule Take 1 capsule (50,000 Units total) by mouth every 7 (seven) days. 08/12/19  Yes Perlie Mayo, NP    Allergies    Patient has no known allergies.  Review of Systems   Review of Systems  Constitutional: Negative for chills and fever.  HENT: Negative.   Respiratory: Positive for shortness of breath.   Cardiovascular: Positive for leg swelling. Negative for chest pain and palpitations.  Gastrointestinal: Negative for abdominal distention, abdominal pain, diarrhea and vomiting.  Genitourinary: Negative for dysuria and frequency.  Musculoskeletal: Negative for arthralgias and myalgias.  Skin: Negative for color change and rash.  Neurological:  Negative for dizziness, syncope and light-headedness.    Physical Exam Updated Vital Signs BP (!) 131/96 (BP Location: Left Arm)   Pulse 93   Temp 98.2 F (36.8 C) (Oral)   Resp (!) 31   Ht 5' 9"  (1.753 m)   SpO2 100%   BMI 35.59 kg/m   Physical Exam Vitals and nursing note reviewed.  Constitutional:      General: He is not in acute distress.    Appearance: He is well-developed. He is obese. He is not ill-appearing or diaphoretic.  HENT:     Head: Normocephalic and atraumatic.  Eyes:     General:        Right eye: No discharge.        Left eye: No discharge.     Pupils: Pupils are equal, round, and reactive to light.  Cardiovascular:     Rate and Rhythm: Normal rate and regular rhythm.     Heart sounds: Normal heart sounds. No murmur. No friction rub. No gallop.   Pulmonary:     Effort: Pulmonary effort is normal. No respiratory distress.     Breath sounds: Rales present. No wheezing.     Comments: Patient tachypneic but able to speak in full sentences, no respiratory distress, rales noted in bilateral lung bases, no wheezes or rhonchi. Chest:     Chest wall: No tenderness.  Abdominal:     General: Bowel sounds are normal. There is no distension.     Palpations: Abdomen is soft. There is no  mass.     Tenderness: There is no abdominal tenderness. There is no guarding.  Musculoskeletal:        General: No deformity.     Cervical back: Neck supple.     Right lower leg: Edema present.     Left lower leg: Edema present.     Comments: Significant 3+ edema up to the upper thigh lower legs with weeping clear fluid, no cellulitis or purulent drainage  Skin:    General: Skin is warm and dry.     Capillary Refill: Capillary refill takes less than 2 seconds.  Neurological:     Mental Status: He is alert.     Coordination: Coordination normal.     Comments: Speech is clear, able to follow commands Moves extremities without ataxia, coordination intact  Psychiatric:        Mood and Affect: Mood normal.        Behavior: Behavior normal.     ED Results / Procedures / Treatments   Labs (all labs ordered are listed, but only abnormal results are displayed) Labs Reviewed  COMPREHENSIVE METABOLIC PANEL - Abnormal; Notable for the following components:      Result Value   Glucose, Bld 297 (*)    Calcium 8.4 (*)    Total Protein 6.3 (*)    Albumin 3.0 (*)    Alkaline Phosphatase 362 (*)    All other components within normal limits  BRAIN NATRIURETIC PEPTIDE - Abnormal; Notable for the following components:   B Natriuretic Peptide 1,082.0 (*)    All other components within normal limits  CBC WITH DIFFERENTIAL/PLATELET - Abnormal; Notable for the following components:   RBC 3.70 (*)    Hemoglobin 11.8 (*)    HCT 37.4 (*)    MCV 101.1 (*)    RDW 15.7 (*)    All other components within normal limits  DIGOXIN LEVEL - Abnormal; Notable for the following components:   Digoxin Level 0.5 (*)  All other components within normal limits  TROPONIN I (HIGH SENSITIVITY) - Abnormal; Notable for the following components:   Troponin I (High Sensitivity) 20 (*)    All other components within normal limits  SARS CORONAVIRUS 2 (TAT 6-24 HRS)  TROPONIN I (HIGH SENSITIVITY)    EKG EKG  Interpretation  Date/Time:  Tuesday September 13 2019 15:13:26 EDT Ventricular Rate:  100 PR Interval:    QRS Duration: 86 QT Interval:  347 QTC Calculation: 430 R Axis:   60 Text Interpretation: Sinus tachycardia Ventricular bigeminy Probable anterior infarct, old No acute changes No significant change since last tracing Confirmed by Varney Biles [phone]) on 09/13/2019 3:36:48 PM   Radiology DG Chest Port 1 View  Result Date: 09/13/2019 CLINICAL DATA:  Short of breath EXAM: PORTABLE CHEST 1 VIEW COMPARISON:  08/24/2019, CT 08/15/2019 FINDINGS: Small right greater than left pleural effusions with possible loculation on the right. Cardiomegaly with vascular congestion. Patchy airspace opacity at the left base. No pneumothorax. IMPRESSION: 1. Cardiomegaly with vascular congestion and small right greater than left pleural effusions. 2. Patchy atelectasis or pneumonia at the left base. Electronically Signed   By: Donavan Foil M.D.   On: 09/13/2019 16:08    Procedures Procedures (including critical care time)  Medications Ordered in ED Medications  furosemide (LASIX) injection 40 mg (has no administration in time range)    ED Course  I have reviewed the triage vital signs and the nursing notes.  Pertinent labs & imaging results that were available during my care of the patient were reviewed by me and considered in my medical decision making (see chart for details).    MDM Rules/Calculators/A&P                     64 year old male presents with CHF exacerbation, sent from cardiologist office for admission after 30 pound fluid gain despite taking Lasix regularly.  In February he had recent hospital admission where he was found to have cardiomyopathy with an EF of 20% he is also currently on digoxin and lisinopril.  Cardiologist sent for admission with plan for IV diuresis and then patient will ultimately likely need heart cath.  Lab work shows no leukocytosis, stable hemoglobin, glucose of  297 but stable renal function and no significant electrolyte derangements.  Troponin is 20, this is unchanged from recent hospital admission and is suspected to be in the setting of patient's heart failure rather than from ischemia, his EKG has no ischemic changes today.  His BNP is 1082, and he has cardiomegaly, pulmonary edema and pleural effusions noted on chest x-ray.  Patient started on IV Lasix.  Dr. Bronson Ing with cardiology is already aware of patient, request hospitalist admission and they will see the patient in the morning for continued consultation.  After a few days of IV diuresis they will plan for transfer to [facility] for a right and left heart catheterization.  Consult placed for medicine admission.  Case discussed with Dr. Denton Brick with [facility] hospitalist who will see and admit the patient.  Final Clinical Impression(s) / ED Diagnoses Final diagnoses:  Acute on chronic congestive heart failure, unspecified heart failure type [facility])    Rx / DC Orders ED Discharge Orders    None       Janet Berlin 09/13/19 1831    Lucrezia Starch, MD 09/13/19 2358

## 2019-09-13 NOTE — Telephone Encounter (Signed)
Stanley Levy called to let Stanley Levy know that the PT has gained 30 pds, and has been admitted to [facility], Pt will be transferred to [facility] for a Heart Cath

## 2019-09-13 NOTE — ED Triage Notes (Signed)
Pt brought over from heart care center due to 30 lb wt gain in 2 weeks and dyspnea with exertion

## 2019-09-13 NOTE — Progress Notes (Signed)
ANTICOAGULATION CONSULT NOTE - Initial Consult  Pharmacy Consult for heparin Indication: LV mural thrombus   No Known Allergies  Patient Measurements: Height: 5' 9"  (175.3 cm) IBW/kg (Calculated) : 70.7 Heparin Dosing Weight: 94.7 kg  Vital Signs: Temp: 98.2 F (36.8 C) (03/16 1524) Temp Source: Oral (03/16 1524) BP: 106/73 (03/16 1830) Pulse Rate: Stanley (03/16 1830)  Labs: Recent Labs    09/13/19 1600 09/13/19 1811  HGB 11.8*  --   HCT 37.4*  --   PLT 215  --   CREATININE 0.90  --   TROPONINIHS 20* 25*    Estimated Creatinine Clearance: 99.7 mL/min (by C-G formula based on SCr of 0.9 mg/dL).   Medical History: Past Medical History:  Diagnosis Date  . Anxiety   . CHF (congestive heart failure) (HCC)    a. EF 20% with global HK by echo in 08/2019. Also noted to have LV mural thrombus.   . Diabetes mellitus, type II ([place])   . Hyperlipidemia   . Thyroid disease     Medications:  Medications Prior to Admission  Medication Sig Dispense Refill Last Dose  . acetaminophen (TYLENOL) 325 MG tablet Take 2 tablets (650 mg total) by mouth every 4 (four) hours as needed for headache or mild pain. 12 tablet 0 09/12/2019 at Unknown time  . apixaban (ELIQUIS) 5 MG TABS tablet Take 1 tablet (5 mg total) by mouth 2 (two) times daily. 60 tablet 3 09/13/2019 at 1000  . blood glucose meter kit and supplies KIT Dispense based on patient and insurance preference. Use up to four times daily as directed. (FOR ICD-9 250.00, 250.01). 1 each 0   . blood glucose meter kit and supplies Dispense based on patient and insurance preference. Use up to four times daily as directed. (FOR ICD-10 E10.9, E11.9). 1 each 0   . digoxin (LANOXIN) 0.125 MG tablet Take 1 tablet (0.125 mg total) by mouth daily. 30 tablet 3 09/13/2019 at 1000  . furosemide (LASIX) 20 MG tablet Take 20 mg by mouth 2 (two) times daily.   09/13/2019 at Unknown time  . gabapentin (NEURONTIN) 100 MG capsule Take 1 capsule (100 mg total) by  mouth 3 (three) times daily. 90 capsule 2 09/13/2019 at Unknown time  . glucose blood (GLUCOSE METER TEST) test strip Use as instructed 400 each 3   . insulin aspart protamine - aspart (NOVOLOG MIX 70/30 FLEXPEN) (70-30) 100 UNIT/ML FlexPen Inject 0.25 mLs (25 Units total) into the skin 2 (two) times daily with a meal. 15 mL 11 09/13/2019 at Unknown time  . Insulin Pen Needle 31G X 5 MM MISC 1 Units by Does not apply route 2 (two) times daily. 100 each 5   . levothyroxine (SYNTHROID) 75 MCG tablet Take 0.5 tablets (37.5 mcg total) by mouth daily before breakfast. 15 tablet 3 09/13/2019 at Unknown time  . lisinopril (ZESTRIL) 2.5 MG tablet Take 1 tablet (2.5 mg total) by mouth daily. 30 tablet 5 09/13/2019 at Unknown time  . OneTouch Delica Lancets 17B MISC Four times daily testing  dx e11.65 150 each 5   . rosuvastatin (CRESTOR) 5 MG tablet Take 1 tablet (5 mg total) by mouth every evening. 30 tablet 3 09/12/2019 at Unknown time  . spironolactone (ALDACTONE) 25 MG tablet Take 0.5 tablets (12.5 mg total) by mouth daily. 15 tablet 3 09/13/2019 at Unknown time  . tamsulosin (FLOMAX) 0.4 MG CAPS capsule Take 1 capsule (0.4 mg total) by mouth daily after supper. 30 capsule 3 09/12/2019  at Unknown time  . Vitamin D, Ergocalciferol, (DRISDOL) 1.25 MG (50000 UNIT) CAPS capsule Take 1 capsule (50,000 Units total) by mouth every 7 (seven) days. 5 capsule 2 09/13/2019 at Unknown time    Assessment: Stanley Levy sent to the [facility] ED from Cardiology office for acute on chronic CHF; also with a recent admit for new CM/HFrEF in Feb. He is up 30 lb within the past 2 weeks. He is on apixaban PTA for a large LV mural thrombus on ECHO 08/16/19. Pharmacy consulted to begin IV heparin while apixaban on hold for [facility]. Plan is to transfer [facility]. Last dose of apixaban was this morning at 10:00. Renal function is normal. No bleeding noted, Hgb low at 11.8, platelets are normal. Baseline aPTT is 36, HL is pending. HL is affected by apixaban so  will adjust heparin rate based on aPTT until HL and aPTT correlate.  Goal of Therapy:  Heparin level 0.3-0.7 units/ml aPTT 66-102 seconds Monitor platelets by anticoagulation protocol: Yes   Plan:  Begin heparin drip at 1450 units/hr with no bolus at 22:00 6h aPTT Daily HL, aPTT, CBC Monitor for s/sx of bleeding  Thank you for involving pharmacy in this patient's care.  Renold Genta, PharmD, BCPS Clinical Pharmacist Clinical phone for 09/13/2019 until 9:30p is [phone] 09/13/2019 7:18 PM  **Pharmacist phone directory can be found on [email or web address] listed under [place]**

## 2019-09-14 DIAGNOSIS — I519 Heart disease, unspecified: Secondary | ICD-10-CM

## 2019-09-14 DIAGNOSIS — E039 Hypothyroidism, unspecified: Secondary | ICD-10-CM

## 2019-09-14 DIAGNOSIS — I513 Intracardiac thrombosis, not elsewhere classified: Secondary | ICD-10-CM

## 2019-09-14 DIAGNOSIS — I10 Essential (primary) hypertension: Secondary | ICD-10-CM

## 2019-09-14 DIAGNOSIS — I5023 Acute on chronic systolic (congestive) heart failure: Secondary | ICD-10-CM

## 2019-09-14 DIAGNOSIS — E1165 Type 2 diabetes mellitus with hyperglycemia: Secondary | ICD-10-CM

## 2019-09-14 LAB — APTT
aPTT: 133 seconds — ABNORMAL HIGH (ref 24–36)
aPTT: 72 seconds — ABNORMAL HIGH (ref 24–36)

## 2019-09-14 LAB — BASIC METABOLIC PANEL
Anion gap: 7 (ref 5–15)
BUN: 18 mg/dL (ref 8–23)
CO2: 27 mmol/L (ref 22–32)
Calcium: 8.6 mg/dL — ABNORMAL LOW (ref 8.9–10.3)
Chloride: 105 mmol/L (ref 98–111)
Creatinine, Ser: 0.76 mg/dL (ref 0.61–1.24)
GFR calc Af Amer: >60 mL/min (ref >60)
GFR calc non Af Amer: >60 mL/min (ref >60)
Glucose, Bld: 53 mg/dL — ABNORMAL LOW (ref 70–99)
Potassium: 3.3 mmol/L — ABNORMAL LOW (ref 3.5–5.1)
Sodium: 139 mmol/L (ref 135–145)

## 2019-09-14 LAB — GLUCOSE, CAPILLARY
Glucose-Capillary: 46 mg/dL — ABNORMAL LOW (ref 70–99)
Glucose-Capillary: 57 mg/dL — ABNORMAL LOW (ref 70–99)
Glucose-Capillary: 98 mg/dL (ref 70–99)
Glucose-Capillary: 142 mg/dL — ABNORMAL HIGH (ref 70–99)
Glucose-Capillary: 163 mg/dL — ABNORMAL HIGH (ref 70–99)
Glucose-Capillary: 245 mg/dL — ABNORMAL HIGH (ref 70–99)

## 2019-09-14 LAB — TSH: TSH: 13.723 u[IU]/mL — ABNORMAL HIGH (ref 0.350–4.500)

## 2019-09-14 LAB — CBC
HCT: 37 % — ABNORMAL LOW (ref 39.0–52.0)
Hemoglobin: 11.7 g/dL — ABNORMAL LOW (ref 13.0–17.0)
MCH: 31.3 pg (ref 26.0–34.0)
MCHC: 31.6 g/dL (ref 30.0–36.0)
MCV: 98.9 fL (ref 80.0–100.0)
Platelets: 201 10*3/uL (ref 150–400)
RBC: 3.74 MIL/uL — ABNORMAL LOW (ref 4.22–5.81)
RDW: 15.5 % (ref 11.5–15.5)
WBC: 6.1 10*3/uL (ref 4.0–10.5)
nRBC: 0 % (ref 0.0–0.2)

## 2019-09-14 LAB — HEPARIN LEVEL (UNFRACTIONATED): Heparin Unfractionated: 1.6 IU/mL — ABNORMAL HIGH (ref 0.30–0.70)

## 2019-09-14 MED ORDER — LISINOPRIL 5 MG PO TABS
5.0000 mg | ORAL_TABLET | Freq: Every day | ORAL | Status: DC
  Administered 2019-09-15 – 2019-09-21 (×7): 5 mg via ORAL
  Filled 2019-09-14 (×7): qty 1

## 2019-09-14 MED ORDER — FUROSEMIDE 10 MG/ML IJ SOLN
60.0000 mg | Freq: Two times a day (BID) | INTRAMUSCULAR | Status: DC
  Administered 2019-09-14 – 2019-09-16 (×4): 60 mg via INTRAVENOUS
  Filled 2019-09-14 (×5): qty 6

## 2019-09-14 MED ORDER — LISINOPRIL 5 MG PO TABS
2.5000 mg | ORAL_TABLET | Freq: Once | ORAL | Status: AC
  Administered 2019-09-14: 12:00:00 2.5 mg via ORAL
  Filled 2019-09-14: qty 1

## 2019-09-14 MED ORDER — POTASSIUM CHLORIDE CRYS ER 20 MEQ PO TBCR
40.0000 meq | EXTENDED_RELEASE_TABLET | Freq: Every day | ORAL | Status: DC
  Administered 2019-09-14 – 2019-09-22 (×9): 40 meq via ORAL
  Filled 2019-09-14 (×9): qty 2

## 2019-09-14 NOTE — Progress Notes (Signed)
   09/14/19 1125  Vitals  BP (!) 104/54  MAP (mmHg) 67  BP Location Right Arm  BP Method Automatic  Patient Position (if appropriate) Lying  Pulse Rate 92  Oxygen Therapy  SpO2 100 %  O2 Device Room Air  MEWS Score  MEWS Temp 0  MEWS Systolic 0  MEWS Pulse 0  MEWS RR 0  MEWS LOC 1  MEWS Score 1  MEWS Score Color Green   Patient has additional lisinopril as well as spironalactone ordered. BP 104/54. Okay to give per cardiology MD

## 2019-09-14 NOTE — Progress Notes (Addendum)
Progress Note  Patient Name: Stanley Levy Date of Encounter: 09/14/2019  Primary Cardiologist: Kate Sable, MD   Subjective   He denies any chest pain or palpitations. Still with orthopnea but improving. Continues to have significant edema.   Inpatient Medications    Scheduled Meds: . digoxin  0.125 mg Oral Daily  . furosemide  60 mg Intravenous Q12H  . gabapentin  100 mg Oral TID  . levothyroxine  37.5 mcg Oral Q0600  . lisinopril  2.5 mg Oral Daily  . potassium chloride  40 mEq Oral Daily  . rosuvastatin  5 mg Oral QPM  . spironolactone  12.5 mg Oral Daily  . tamsulosin  0.4 mg Oral QPC supper   Continuous Infusions: . heparin 1,200 Units/hr (09/14/19 0859)   PRN Meds: acetaminophen **OR** acetaminophen, ondansetron **OR** ondansetron (ZOFRAN) IV, polyethylene glycol   Vital Signs    Vitals:   09/14/19 0500 09/14/19 0621 09/14/19 0806 09/14/19 0849  BP:  119/78  111/63  Pulse:  63  80  Resp:  20    Temp:  97.6 F (36.4 C)    TempSrc:  Oral    SpO2:  100% 100% 98%  Weight: 106.3 kg     Height:        Intake/Output Summary (Last 24 hours) at 09/14/2019 0953 Last data filed at 09/14/2019 0854 Gross per 24 hour  Intake 1058.4 ml  Output 1350 ml  Net -291.6 ml    Last 3 Weights 09/14/2019 09/13/2019 09/13/2019  Weight (lbs) 234 lb 5.6 oz 232 lb 12.9 oz 241 lb  Weight (kg) 106.3 kg 105.6 kg 109.317 kg  Some encounter information is confidential and restricted. Go to Review Flowsheets activity to see all data.      Telemetry    NSR, HR in 80's to 90's with frequent PVC's, occurring in a bigeminal pattern at times- Personally Reviewed  ECG    Sinus tachycardia, HR 100, with PVC's with nonspecific IVCD.   - Personally Reviewed  Physical Exam   General: Well developed male appearing in no acute distress. Head: Normocephalic, atraumatic.  Neck: Supple without bruits, JVD not elevated. Lungs:  Resp regular and unlabored, rales along bases  bilaterally. Heart: RRR with ectopic beats, S1, S2, no S3, S4, or murmur; no rub. Abdomen: Soft, non-tender, non-distended with normoactive bowel sounds. No hepatomegaly. No rebound/guarding. No obvious abdominal masses. Extremities: No clubbing or cyanosis, 2+ pitting edema up to knees bilaterally. Distal pedal pulses are 2+ bilaterally. Neuro: Alert and oriented X 3. Moves all extremities spontaneously. Psych: Normal affect.  Labs    Chemistry Recent Labs  Lab 09/13/19 1600 09/14/19 0650  NA 135 139  K 3.6 3.3*  CL 101 105  CO2 25 27  GLUCOSE 297* 53*  BUN 20 18  CREATININE 0.90 0.76  CALCIUM 8.4* 8.6*  PROT 6.3*  --   ALBUMIN 3.0*  --   AST 26  --   ALT 41  --   ALKPHOS 362*  --   BILITOT 0.8  --   GFRNONAA >60 >60  GFRAA >60 >60  ANIONGAP 9 7     Hematology Recent Labs  Lab 09/13/19 1600 09/14/19 0650  WBC 5.5 6.1  RBC 3.70* 3.74*  HGB 11.8* 11.7*  HCT 37.4* 37.0*  MCV 101.1* 98.9  MCH 31.9 31.3  MCHC 31.6 31.6  RDW 15.7* 15.5  PLT 215 201    Cardiac EnzymesNo results for input(s): TROPONINI in the last 168 hours. No results  for input(s): TROPIPOC in the last 168 hours.   BNP Recent Labs  Lab 09/13/19 1600  BNP 1,082.0*     DDimer No results for input(s): DDIMER in the last 168 hours.   Radiology    DG Chest Port 1 View  Result Date: 09/13/2019 CLINICAL DATA:  Short of breath EXAM: PORTABLE CHEST 1 VIEW COMPARISON:  08/24/2019, CT 08/15/2019 FINDINGS: Small right greater than left pleural effusions with possible loculation on the right. Cardiomegaly with vascular congestion. Patchy airspace opacity at the left base. No pneumothorax. IMPRESSION: 1. Cardiomegaly with vascular congestion and small right greater than left pleural effusions. 2. Patchy atelectasis or pneumonia at the left base. Electronically Signed   By: Jasmine Pang M.D.   On: 09/13/2019 16:08    Cardiac Studies   Echocardiogram: 08/2019 IMPRESSIONS    1. Left ventricular  ejection fraction, by estimation, is 20%. The left  ventricle has severely decreased function. The left ventricle demonstrates  global hypokinesis with regional variation.  2. Large LV mural thrombus in distal anterolateral position,  approximately 2.0 x 2.5 cm (trabecular muscle obscures extent to some  degree). Reported to Dr. Mariea Clonts.  3. Right ventricular systolic function is moderately reduced. The right  ventricular size is normal. There is mildly elevated pulmonary artery  systolic pressure. The estimated right ventricular systolic pressure is  32.4 mmHg.  4. Left atrial size was mildly dilated.  5. Right atrial size was moderately dilated.  6. The mitral valve is grossly normal. Trivial mitral valve  regurgitation.  7. The aortic valve is tricuspid. Aortic valve regurgitation is not  visualized.  8. The inferior vena cava is normal in size with greater than 50%  respiratory variability, suggesting right atrial pressure of 3 mmHg.   Patient Profile     64 y.o. male w/ PMH of chronic systolic CHF (EF 96% by echo in 08/2019), LV Mural Thrombus (on Eliquis PTA), HTN, HLD, Uncontrolled IDDM, Hypothyroidism and history of CVA who was sent directly to the ED from the office on 09/13/2019 for 30 lb weight gain within the past 2 weeks and significant volume overload.    Assessment & Plan    1. Acute on Chronic Systolic CHF - echo last month showed an EF of 20% with Global HK and he wished to defer further cardiac workup to the outpatient setting. Presented to the office for follow-up and weight had increased by 28 lbs within 2 weeks (weight had been 213 lbs at the time of hospital discharge in 08/2019, at 241 lbs on the office scales).  -  BNP elevated to 1082 on admission.CXR showed cardiomegaly with vascular congestion and bilateral effusions. He was started on IV Lasix 40mg  BID upon admission. I&O's not fully recorded but weight listed as having declined to 234 lbs today. Repeat  labs this AM show creatinine remains stable at 0.76 and K+ is low at 3.3. Will order K+ supplementation. Given significant volume overload, will titrate Lasix to 60mg  BID. Ultimately plan for [facility] this admission once volume improves.  - remains on Digoxin 0.125mg  daily (dig level 0.5 this admission), Lisinopril 2.5mg  daily and Spironolactone 12.5mg  daily. Not on BB given concern for low-output.   2. LV Thrombus - echocardiogram last month showed an LV thrombus measuring 2.0 x 2.5 cm. He was started on Eliquis that admission given concerns for noncompliance with Coumadin. Eliquis currently held in anticipation of catheterization and being bridged with Heparin.   3. HTN - BP has been low-normal at  106/63 - 134/114 within the past 24 hours. He has been continued on PTA Digoxin 0.125mg  daily, Lisinopril 2.5mg  daily and Spironolactone 12.5mg  daily.   4. Uncontrolled IDDM - Hgb A1c > 18.5 in 08/2019. Management per admitting team. Episodes of hypoglycemia overnight noted.   5. Uncontrolled Hypothyroidism - TSH elevated to 15.913 in 08/2019. Has been continued on PTA Synthroid.    For questions or updates, please contact [facility] Please consult [email or web address] for contact info under Cardiology/STEMI.   Signed, Ellsworth Lennox , PA-C 9:53 AM 09/14/2019 Pager: [phone]  The patient was seen and examined, and I agree with the history, physical exam, assessment and plan as documented above, with modifications made above and as noted below.  He said his breathing has improved but he remains orthopneic.  He has significant bilateral lower extremity edema. We will increase IV Lasix to 60 mg twice daily.  He remains on low-dose lisinopril and spironolactone along with digoxin.  I will increase lisinopril to 5 mg daily to assist with unloading LVEDP.  No beta-blockers given low output heart failure. Once he is no longer significantly orthopneic, will plan to transfer to [facility] for  right and left heart catheterization and coronary angiography. Eliquis on hold and he remains on IV heparin for left ventricular thrombus given upcoming cardiac catheterization. Last TSH was in the 15 range in February.  I will repeat today.  He remains on levothyroxine.   Prentice Docker, MD, [facility]  09/14/2019 10:22 AM

## 2019-09-14 NOTE — Progress Notes (Addendum)
PROGRESS NOTE [facility]   Stanley Levy  DEY:[id number]  DOB: 11-Oct-1955  DOA: 09/13/2019 PCP: Stanley Mayo, NP   Brief Admission Hx: 64 year old gentleman with low output systolic congestive heart failure EF 20%, hypertension, diabetes, poor compliance with medication was sent from cardiology office with progressive lower extremity edema and shortness of breath.  MDM/Assessment & Plan:   1. HFrEF -EF 20% from last echo 2/16.  The patient remains very volume overloaded and is being treated with IV Lasix diuresis.  The plan is for him to be with her diuresed and stabilized and then transferred to [facility] for heart catheterization.  Continue monitoring intake output and weights continue fluid restriction, follow cardiology recommendations. 2. Large LV thrombus and intracardiac thrombosis-patient remains fully anticoagulated with IV heparin as his apixaban is on hold. 3. Cerebrovascular disease status post history of CVA-he is on apixaban for full anticoagulation and Crestor. 4. Hypothyroidism-he is on home levothyroxine.  He recently had a markedly elevated TSH of 15.9. 5. Type 2 DM with severe hypoglycemia from insulin -patient had severe hypoglycemic reaction this morning from 70/30 insulin.  He likely is not taking it consistently at home with his history of poor compliance.  I have discontinued all insulin at this time we will monitor his blood sugars and come up with a plan to keep his blood sugars in the 140-180 range if possible without causing hypoglycemia.  I will likely start with a sensitive SSI and monitor his response.    DVT prophylaxis: Heparin Code Status: DNR Family Communication: Attempted telephone call x2 Disposition Plan: Continue inpatient management with plans to transfer to [facility] for cardiac catheterization when medically optimized   Consultants:  Cardiology  Procedures:    Antimicrobials:     Subjective: Patient reports that he is  working hard to follow fluid restrictions  Objective: Vitals:   09/14/19 0621 09/14/19 0806 09/14/19 0849 09/14/19 1125  BP: 119/78  111/63 (!) 104/54  Pulse: 63  80 92  Resp: 20     Temp: 97.6 F (36.4 C)     TempSrc: Oral     SpO2: 100% 100% 98% 100%  Weight:      Height:        Intake/Output Summary (Last 24 hours) at 09/14/2019 1242 Last data filed at 09/14/2019 1129 Gross per 24 hour  Intake 1348.15 ml  Output 1950 ml  Net -601.85 ml   Filed Weights   09/13/19 1930 09/14/19 0500  Weight: 105.6 kg 106.3 kg   REVIEW OF SYSTEMS  As per history otherwise all reviewed and reported negative  Exam:  General exam: Chronically ill-appearing male awake and alert in no acute distress cooperative. Respiratory system: Bibasilar crackles. No increased work of breathing. Cardiovascular system: S1 & S2 heard. No JVD, murmurs, gallops, clicks or pedal edema. Gastrointestinal system: Abdomen is nondistended, soft and nontender. Normal bowel sounds heard. Central nervous system: Alert and oriented. No focal neurological deficits. Extremities: 3+ pitting edema bilateral lower extremities.  Data Reviewed: Basic Metabolic Panel: Recent Labs  Lab 09/13/19 1600 09/14/19 0650  NA 135 139  K 3.6 3.3*  CL 101 105  CO2 25 27  GLUCOSE 297* 53*  BUN 20 18  CREATININE 0.90 0.76  CALCIUM 8.4* 8.6*   Liver Function Tests: Recent Labs  Lab 09/13/19 1600  AST 26  ALT 41  ALKPHOS 362*  BILITOT 0.8  PROT 6.3*  ALBUMIN 3.0*   No results for input(s): LIPASE, AMYLASE in the  last 168 hours. No results for input(s): AMMONIA in the last 168 hours. CBC: Recent Labs  Lab 09/13/19 1600 09/14/19 0650  WBC 5.5 6.1  NEUTROABS 3.7  --   HGB 11.8* 11.7*  HCT 37.4* 37.0*  MCV 101.1* 98.9  PLT 215 201   Cardiac Enzymes: No results for input(s): CKTOTAL, CKMB, CKMBINDEX, TROPONINI in the last 168 hours. CBG (last 3)  Recent Labs    09/14/19 0807 09/14/19 0848 09/14/19 1106    GLUCAP 57* 98 142*   Recent Results (from the past 240 hour(s))  SARS CORONAVIRUS 2 (TAT 6-24 HRS) Nasopharyngeal Nasopharyngeal Swab     Status: None   Collection Time: 09/13/19  5:02 PM   Specimen: Nasopharyngeal Swab  Result Value Ref Range Status   SARS Coronavirus 2 NEGATIVE NEGATIVE Final    Comment: (NOTE) SARS-CoV-2 target nucleic acids are NOT DETECTED. The SARS-CoV-2 RNA is generally detectable in upper and lower respiratory specimens during the acute phase of infection. Negative results do not preclude SARS-CoV-2 infection, do not rule out co-infections with other pathogens, and should not be used as the sole basis for treatment or other patient management decisions. Negative results must be combined with clinical observations, patient history, and epidemiological information. The expected result is Negative. Fact Sheet for Patients: HairSlick.no Fact Sheet for Healthcare Providers: quierodirigir.com This test is not yet approved or cleared by the [place] FDA and  has been authorized for detection and/or diagnosis of SARS-CoV-2 by FDA under an Emergency Use Authorization (EUA). This EUA will remain  in effect (meaning this test can be used) for the duration of the COVID-19 declaration under Section 56 4(b)(1) of the Act, 21 U.S.C. section 360bbb-3(b)(1), unless the authorization is terminated or revoked sooner. Performed at [facility], [address], [place], [place] [address]      Studies: DG Chest Port 1 View  Result Date: 09/13/2019 CLINICAL DATA:  Short of breath EXAM: PORTABLE CHEST 1 VIEW COMPARISON:  08/24/2019, CT 08/15/2019 FINDINGS: Small right greater than left pleural effusions with possible loculation on the right. Cardiomegaly with vascular congestion. Patchy airspace opacity at the left base. No pneumothorax. IMPRESSION: 1. Cardiomegaly with vascular congestion and small right greater  than left pleural effusions. 2. Patchy atelectasis or pneumonia at the left base. Electronically Signed   By: Jasmine Pang M.D.   On: 09/13/2019 16:08     Scheduled Meds: . digoxin  0.125 mg Oral Daily  . furosemide  60 mg Intravenous Q12H  . gabapentin  100 mg Oral TID  . levothyroxine  37.5 mcg Oral Q0600  . [START ON 09/15/2019] lisinopril  5 mg Oral Daily  . potassium chloride  40 mEq Oral Daily  . rosuvastatin  5 mg Oral QPM  . spironolactone  12.5 mg Oral Daily  . tamsulosin  0.4 mg Oral QPC supper   Continuous Infusions: . heparin 1,200 Units/hr (09/14/19 0859)    Active Problems:   Acute on chronic systolic (congestive) heart failure (HCC)   Time spent:   Standley Dakins, MD [facility] 09/14/2019, 12:42 PM    LOS: 1 day  How to contact the [facility] Attending or Consulting provider 7A - 7P or covering provider during after hours 7P -7A, for this patient?  1. Check the care team in [facility] and look for a) attending/consulting TR[facility] provider listed and b) the [facility] team listed 2. Log into [email or web address] and use [facility]'s universal password to access. If you do not have the  password, please contact the hospital operator. 3. Locate the [facility] provider you are looking for under [facility] and page to a number that you can be directly reached. 4. If you still have difficulty reaching the provider, please page the [facility] (Director on Call) for the Hospitalists listed on amion for assistance.

## 2019-09-14 NOTE — Progress Notes (Addendum)
ANTICOAGULATION CONSULT NOTE -   Pharmacy Consult for heparin Indication: LV mural thrombus   No Known Allergies  Patient Measurements: Height: _0  (175.3 cm) Weight: 234 lb 5.6 oz (106.3 kg) IBW/kg (Calculated) : 70.7 Heparin Dosing Weight: 94.7 kg  Vital Signs: Temp: 97.6 F (36.4 C) (03/17 0621) Temp Source: Oral (03/17 0621) BP: 119/78 (03/17 0621) Pulse Rate: 63 (03/17 0621)  Labs: Recent Labs    09/13/19 1600 09/13/19 1811 09/13/19 2041 09/14/19 0650  HGB 11.8*  --   --  11.7*  HCT 37.4*  --   --  37.0*  PLT 215  --   --  201  APTT  --  36  --  133*  HEPARINUNFRC  --   --  1.75* 1.60*  CREATININE 0.90  --   --  0.76  TROPONINIHS 20* 25*  --   --     Estimated Creatinine Clearance: 110.5 mL/min (by C-G formula based on SCr of 0.76 mg/dL).   Medical History: Past Medical History:  Diagnosis Date  . Anxiety   . CHF (congestive heart failure) (HCC)    a. EF 20% with global HK by echo in 08/2019. Also noted to have LV mural thrombus.   . Diabetes mellitus, type II ([place])   . Hyperlipidemia   . Thyroid disease     Medications:  Medications Prior to Admission  Medication Sig Dispense Refill Last Dose  . acetaminophen (TYLENOL) 325 MG tablet Take 2 tablets (650 mg total) by mouth every 4 (four) hours as needed for headache or mild pain. 12 tablet 0 09/12/2019 at Unknown time  . apixaban (ELIQUIS) 5 MG TABS tablet Take 1 tablet (5 mg total) by mouth 2 (two) times daily. 60 tablet 3 09/13/2019 at 1000  . blood glucose meter kit and supplies KIT Dispense based on patient and insurance preference. Use up to four times daily as directed. (FOR ICD-9 250.00, 250.01). 1 each 0   . blood glucose meter kit and supplies Dispense based on patient and insurance preference. Use up to four times daily as directed. (FOR ICD-10 E10.9, E11.9). 1 each 0   . digoxin (LANOXIN) 0.125 MG tablet Take 1 tablet (0.125 mg total) by mouth daily. 30 tablet 3 09/13/2019 at 1000  . furosemide  (LASIX) 20 MG tablet Take 20 mg by mouth 2 (two) times daily.   09/13/2019 at Unknown time  . gabapentin (NEURONTIN) 100 MG capsule Take 1 capsule (100 mg total) by mouth 3 (three) times daily. 90 capsule 2 09/13/2019 at Unknown time  . glucose blood (GLUCOSE METER TEST) test strip Use as instructed 400 each 3   . insulin aspart protamine - aspart (NOVOLOG MIX 70/30 FLEXPEN) (70-30) 100 UNIT/ML FlexPen Inject 0.25 mLs (25 Units total) into the skin 2 (two) times daily with a meal. 15 mL 11 09/13/2019 at Unknown time  . Insulin Pen Needle 31G X 5 MM MISC 1 Units by Does not apply route 2 (two) times daily. 100 each 5   . levothyroxine (SYNTHROID) 75 MCG tablet Take 0.5 tablets (37.5 mcg total) by mouth daily before breakfast. 15 tablet 3 09/13/2019 at Unknown time  . lisinopril (ZESTRIL) 2.5 MG tablet Take 1 tablet (2.5 mg total) by mouth daily. 30 tablet 5 09/13/2019 at Unknown time  . OneTouch Delica Lancets 64Q MISC Four times daily testing  dx e11.65 150 each 5   . rosuvastatin (CRESTOR) 5 MG tablet Take 1 tablet (5 mg total) by mouth every evening. [place]  tablet 3 09/12/2019 at Unknown time  . spironolactone (ALDACTONE) 25 MG tablet Take 0.5 tablets (12.5 mg total) by mouth daily. 15 tablet 3 09/13/2019 at Unknown time  . tamsulosin (FLOMAX) 0.4 MG CAPS capsule Take 1 capsule (0.4 mg total) by mouth daily after supper. 30 capsule 3 09/12/2019 at Unknown time  . Vitamin D, Ergocalciferol, (DRISDOL) 1.25 MG (50000 UNIT) CAPS capsule Take 1 capsule (50,000 Units total) by mouth every 7 (seven) days. 5 capsule 2 09/13/2019 at Unknown time    Assessment: 40 yom sent to the [facility] ED from Cardiology office for acute on chronic CHF; also with a recent admit for new CM/HFrEF in Feb. He is up 30 lb within the past 2 weeks. He is on apixaban PTA for a large LV mural thrombus on ECHO 08/16/19. Pharmacy consulted to begin IV heparin while apixaban on hold for [facility]. Marland Kitchen Renal function is normal. No bleeding noted, Hgb low at  11.8, platelets are normal. . HL is affected by apixaban so will adjust heparin rate based on aPTT until HL and aPTT correlate.  HL 1.60 APTT 133  Goal of Therapy:  Heparin level 0.3-0.7 units/ml aPTT 66-102 seconds Monitor platelets by anticoagulation protocol: Yes   Plan:  Hold heparin drip for 1 hour Decrease heparin infusion to 1200 units/hr  6h aPTT Daily HL, aPTT, CBC Monitor for s/sx of bleeding  Thank you for involving pharmacy in this patient's care.  Margot Ables, PharmD Clinical Pharmacist 09/14/2019 8:13 AM

## 2019-09-14 NOTE — Plan of Care (Signed)
  Problem: Nutrition: Goal: Adequate nutrition will be maintained Outcome: Progressing   Problem: Safety: Goal: Ability to remain free from injury will improve Outcome: Progressing   

## 2019-09-14 NOTE — Progress Notes (Signed)
ANTICOAGULATION CONSULT NOTE -   Pharmacy Consult for heparin Indication: LV mural thrombus   No Known Allergies  Patient Measurements: Height: 5' 9" (175.3 cm) Weight: 234 lb 5.6 oz (106.3 kg) IBW/kg (Calculated) : 70.7 Heparin Dosing Weight: 94.7 kg  Vital Signs: Temp: 98.1 F (36.7 C) (03/17 1415) Temp Source: Oral (03/17 0621) BP: 113/76 (03/17 1415) Pulse Rate: 93 (03/17 1415)  Labs: Recent Labs    09/13/19 1600 09/13/19 1811 09/13/19 2041 09/14/19 0650 09/14/19 1456  HGB 11.8*  --   --  11.7*  --   HCT 37.4*  --   --  37.0*  --   PLT 215  --   --  201  --   APTT  --  36  --  133* 72*  HEPARINUNFRC  --   --  1.75* 1.60*  --   CREATININE 0.90  --   --  0.76  --   TROPONINIHS 20* 25*  --   --   --     Estimated Creatinine Clearance: 110.5 mL/min (by C-G formula based on SCr of 0.76 mg/dL).   Medical History: Past Medical History:  Diagnosis Date  . Anxiety   . CHF (congestive heart failure) (HCC)    a. EF 20% with global HK by echo in 08/2019. Also noted to have LV mural thrombus.   . Diabetes mellitus, type II ([place])   . Hyperlipidemia   . Thyroid disease     Medications:  Medications Prior to Admission  Medication Sig Dispense Refill Last Dose  . acetaminophen (TYLENOL) 325 MG tablet Take 2 tablets (650 mg total) by mouth every 4 (four) hours as needed for headache or mild pain. 12 tablet 0 09/12/2019 at Unknown time  . apixaban (ELIQUIS) 5 MG TABS tablet Take 1 tablet (5 mg total) by mouth 2 (two) times daily. 60 tablet 3 09/13/2019 at 1000  . blood glucose meter kit and supplies KIT Dispense based on patient and insurance preference. Use up to four times daily as directed. (FOR ICD-9 250.00, 250.01). 1 each 0   . blood glucose meter kit and supplies Dispense based on patient and insurance preference. Use up to four times daily as directed. (FOR ICD-10 E10.9, E11.9). 1 each 0   . digoxin (LANOXIN) 0.125 MG tablet Take 1 tablet (0.125 mg total) by mouth  daily. 30 tablet 3 09/13/2019 at 1000  . furosemide (LASIX) 20 MG tablet Take 20 mg by mouth 2 (two) times daily.   09/13/2019 at Unknown time  . gabapentin (NEURONTIN) 100 MG capsule Take 1 capsule (100 mg total) by mouth 3 (three) times daily. 90 capsule 2 09/13/2019 at Unknown time  . glucose blood (GLUCOSE METER TEST) test strip Use as instructed 400 each 3   . insulin aspart protamine - aspart (NOVOLOG MIX 70/30 FLEXPEN) (70-30) 100 UNIT/ML FlexPen Inject 0.25 mLs (25 Units total) into the skin 2 (two) times daily with a meal. 15 mL 11 09/13/2019 at Unknown time  . Insulin Pen Needle 31G X 5 MM MISC 1 Units by Does not apply route 2 (two) times daily. 100 each 5   . levothyroxine (SYNTHROID) 75 MCG tablet Take 0.5 tablets (37.5 mcg total) by mouth daily before breakfast. 15 tablet 3 09/13/2019 at Unknown time  . lisinopril (ZESTRIL) 2.5 MG tablet Take 1 tablet (2.5 mg total) by mouth daily. 30 tablet 5 09/13/2019 at Unknown time  . OneTouch Delica Lancets 13K MISC Four times daily testing  dx e11.65 150  each 5   . rosuvastatin (CRESTOR) 5 MG tablet Take 1 tablet (5 mg total) by mouth every evening. 30 tablet 3 09/12/2019 at Unknown time  . spironolactone (ALDACTONE) 25 MG tablet Take 0.5 tablets (12.5 mg total) by mouth daily. 15 tablet 3 09/13/2019 at Unknown time  . tamsulosin (FLOMAX) 0.4 MG CAPS capsule Take 1 capsule (0.4 mg total) by mouth daily after supper. 30 capsule 3 09/12/2019 at Unknown time  . Vitamin D, Ergocalciferol, (DRISDOL) 1.25 MG (50000 UNIT) CAPS capsule Take 1 capsule (50,000 Units total) by mouth every 7 (seven) days. 5 capsule 2 09/13/2019 at Unknown time    Assessment: 53 yom sent to the [facility] from Cardiology office for acute on chronic CHF; also with a recent admit for new CM/HFrEF in Feb. He is up 30 lb within the past 2 weeks. He is on apixaban PTA for a large LV mural thrombus on ECHO 08/16/19. Pharmacy consulted to begin IV heparin while apixaban on hold for [facility]. Marland Kitchen Renal  function is normal. No bleeding noted, Hgb low at 11.8, platelets are normal. . HL is affected by apixaban so will adjust heparin rate based on aPTT until HL and aPTT correlate.  HL 1.60 APTT 72- therapeutic  Goal of Therapy:  Heparin level 0.3-0.7 units/ml aPTT 66-102 seconds Monitor platelets by anticoagulation protocol: Yes   Plan:  Continue heparin infusion at 1200 units/hr  Confirmatory aPTT in 6h  Daily HL, aPTT, CBC Monitor for s/sx of bleeding  Thank you for involving pharmacy in this patient's care.  Margot Ables, PharmD Clinical Pharmacist 09/14/2019 4:27 PM

## 2019-09-15 ENCOUNTER — Other Ambulatory Visit: Payer: PPO | Admitting: Internal Medicine

## 2019-09-15 DIAGNOSIS — R7989 Other specified abnormal findings of blood chemistry: Secondary | ICD-10-CM

## 2019-09-15 DIAGNOSIS — E785 Hyperlipidemia, unspecified: Secondary | ICD-10-CM

## 2019-09-15 DIAGNOSIS — R296 Repeated falls: Secondary | ICD-10-CM

## 2019-09-15 LAB — CBC
HCT: 38.7 % — ABNORMAL LOW (ref 39.0–52.0)
Hemoglobin: 12.1 g/dL — ABNORMAL LOW (ref 13.0–17.0)
MCH: 31.3 pg (ref 26.0–34.0)
MCHC: 31.3 g/dL (ref 30.0–36.0)
MCV: 100.3 fL — ABNORMAL HIGH (ref 80.0–100.0)
Platelets: 195 10*3/uL (ref 150–400)
RBC: 3.86 MIL/uL — ABNORMAL LOW (ref 4.22–5.81)
RDW: 15.9 % — ABNORMAL HIGH (ref 11.5–15.5)
WBC: 5.2 10*3/uL (ref 4.0–10.5)
nRBC: 0 % (ref 0.0–0.2)

## 2019-09-15 LAB — BASIC METABOLIC PANEL
Anion gap: 9 (ref 5–15)
BUN: 18 mg/dL (ref 8–23)
CO2: 27 mmol/L (ref 22–32)
Calcium: 8.4 mg/dL — ABNORMAL LOW (ref 8.9–10.3)
Chloride: 101 mmol/L (ref 98–111)
Creatinine, Ser: 0.9 mg/dL (ref 0.61–1.24)
GFR calc Af Amer: >60 mL/min (ref >60)
GFR calc non Af Amer: >60 mL/min (ref >60)
Glucose, Bld: 224 mg/dL — ABNORMAL HIGH (ref 70–99)
Potassium: 3.7 mmol/L (ref 3.5–5.1)
Sodium: 137 mmol/L (ref 135–145)

## 2019-09-15 LAB — GLUCOSE, CAPILLARY
Glucose-Capillary: 37 mg/dL — CL (ref 70–99)
Glucose-Capillary: 224 mg/dL — ABNORMAL HIGH (ref 70–99)
Glucose-Capillary: 194 mg/dL — ABNORMAL HIGH (ref 70–99)
Glucose-Capillary: 312 mg/dL — ABNORMAL HIGH (ref 70–99)
Glucose-Capillary: 248 mg/dL — ABNORMAL HIGH (ref 70–99)
Glucose-Capillary: 268 mg/dL — ABNORMAL HIGH (ref 70–99)

## 2019-09-15 LAB — APTT
aPTT: 100 seconds — ABNORMAL HIGH (ref 24–36)
aPTT: 79 seconds — ABNORMAL HIGH (ref 24–36)

## 2019-09-15 LAB — HEPARIN LEVEL (UNFRACTIONATED): Heparin Unfractionated: 0.68 IU/mL (ref 0.30–0.70)

## 2019-09-15 MED ORDER — INSULIN ASPART 100 UNIT/ML ~~LOC~~ SOLN
0.0000 [IU] | Freq: Three times a day (TID) | SUBCUTANEOUS | Status: DC
  Administered 2019-09-15: 12:00:00 7 [IU] via SUBCUTANEOUS
  Administered 2019-09-15: 3 [IU] via SUBCUTANEOUS
  Administered 2019-09-16: 5 [IU] via SUBCUTANEOUS
  Administered 2019-09-16: 1 [IU] via SUBCUTANEOUS
  Administered 2019-09-16: 7 [IU] via SUBCUTANEOUS
  Administered 2019-09-17: 2 [IU] via SUBCUTANEOUS
  Administered 2019-09-18: 1 [IU] via SUBCUTANEOUS
  Administered 2019-09-18: 3 [IU] via SUBCUTANEOUS
  Administered 2019-09-19 (×3): 2 [IU] via SUBCUTANEOUS
  Administered 2019-09-20: 3 [IU] via SUBCUTANEOUS
  Administered 2019-09-20: 7 [IU] via SUBCUTANEOUS
  Administered 2019-09-20: 5 [IU] via SUBCUTANEOUS
  Administered 2019-09-21: 1 [IU] via SUBCUTANEOUS
  Administered 2019-09-21 (×2): 3 [IU] via SUBCUTANEOUS
  Administered 2019-09-22: 2 [IU] via SUBCUTANEOUS
  Administered 2019-09-22: 3 [IU] via SUBCUTANEOUS

## 2019-09-15 MED ORDER — INSULIN ASPART 100 UNIT/ML ~~LOC~~ SOLN
3.0000 [IU] | Freq: Three times a day (TID) | SUBCUTANEOUS | Status: DC
  Administered 2019-09-15: 3 [IU] via SUBCUTANEOUS

## 2019-09-15 NOTE — Progress Notes (Signed)
ANTICOAGULATION CONSULT NOTE -   Pharmacy Consult for heparin Indication: LV mural thrombus   No Known Allergies  Patient Measurements: Height: 5' 9"  (175.3 cm) Weight: 227 lb 1.2 oz (103 kg) IBW/kg (Calculated) : 70.7 Heparin Dosing Weight: 94.7 kg  Vital Signs: Temp: 98 F (36.7 C) (03/17 2046) Temp Source: Oral (03/17 2046) BP: 123/86 (03/17 2046) Pulse Rate: 101 (03/17 2046)  Labs: Recent Labs    09/13/19 1600 09/13/19 1600 09/13/19 1811 09/13/19 2041 09/14/19 0650 09/14/19 0650 09/14/19 1456 09/14/19 2247 09/15/19 0630  HGB 11.8*   < >  --   --  11.7*  --   --   --  12.1*  HCT 37.4*  --   --   --  37.0*  --   --   --  38.7*  PLT 215  --   --   --  201  --   --   --  195  APTT  --    < > 36  --  133*   < > 72* 100* 79*  HEPARINUNFRC  --   --   --  1.75* 1.60*  --   --   --  0.68  CREATININE 0.90  --   --   --  0.76  --   --   --  0.90  TROPONINIHS 20*  --  25*  --   --   --   --   --   --    < > = values in this interval not displayed.    Estimated Creatinine Clearance: 96.8 mL/min (by C-G formula based on SCr of 0.9 mg/dL).   Medical History: Past Medical History:  Diagnosis Date  . Anxiety   . CHF (congestive heart failure) (HCC)    a. EF 20% with global HK by echo in 08/2019. Also noted to have LV mural thrombus.   . Diabetes mellitus, type II ([place])   . Hyperlipidemia   . Thyroid disease     Medications:  Medications Prior to Admission  Medication Sig Dispense Refill Last Dose  . acetaminophen (TYLENOL) 325 MG tablet Take 2 tablets (650 mg total) by mouth every 4 (four) hours as needed for headache or mild pain. 12 tablet 0 09/12/2019 at Unknown time  . apixaban (ELIQUIS) 5 MG TABS tablet Take 1 tablet (5 mg total) by mouth 2 (two) times daily. 60 tablet 3 09/13/2019 at 1000  . blood glucose meter kit and supplies KIT Dispense based on patient and insurance preference. Use up to four times daily as directed. (FOR ICD-9 250.00, 250.01). 1 each 0   .  blood glucose meter kit and supplies Dispense based on patient and insurance preference. Use up to four times daily as directed. (FOR ICD-10 E10.9, E11.9). 1 each 0   . digoxin (LANOXIN) 0.125 MG tablet Take 1 tablet (0.125 mg total) by mouth daily. 30 tablet 3 09/13/2019 at 1000  . furosemide (LASIX) 20 MG tablet Take 20 mg by mouth 2 (two) times daily.   09/13/2019 at Unknown time  . gabapentin (NEURONTIN) 100 MG capsule Take 1 capsule (100 mg total) by mouth 3 (three) times daily. 90 capsule 2 09/13/2019 at Unknown time  . glucose blood (GLUCOSE METER TEST) test strip Use as instructed 400 each 3   . insulin aspart protamine - aspart (NOVOLOG MIX 70/30 FLEXPEN) (70-30) 100 UNIT/ML FlexPen Inject 0.25 mLs (25 Units total) into the skin 2 (two) times daily with a meal. 15 mL 11  09/13/2019 at Unknown time  . Insulin Pen Needle 31G X 5 MM MISC 1 Units by Does not apply route 2 (two) times daily. 100 each 5   . levothyroxine (SYNTHROID) 75 MCG tablet Take 0.5 tablets (37.5 mcg total) by mouth daily before breakfast. 15 tablet 3 09/13/2019 at Unknown time  . lisinopril (ZESTRIL) 2.5 MG tablet Take 1 tablet (2.5 mg total) by mouth daily. 30 tablet 5 09/13/2019 at Unknown time  . OneTouch Delica Lancets 82K MISC Four times daily testing  dx e11.65 150 each 5   . rosuvastatin (CRESTOR) 5 MG tablet Take 1 tablet (5 mg total) by mouth every evening. 30 tablet 3 09/12/2019 at Unknown time  . spironolactone (ALDACTONE) 25 MG tablet Take 0.5 tablets (12.5 mg total) by mouth daily. 15 tablet 3 09/13/2019 at Unknown time  . tamsulosin (FLOMAX) 0.4 MG CAPS capsule Take 1 capsule (0.4 mg total) by mouth daily after supper. 30 capsule 3 09/12/2019 at Unknown time  . Vitamin D, Ergocalciferol, (DRISDOL) 1.25 MG (50000 UNIT) CAPS capsule Take 1 capsule (50,000 Units total) by mouth every 7 (seven) days. 5 capsule 2 09/13/2019 at Unknown time    Assessment: 38 yom sent [facility] from [facility] office for acute on chronic  CHF; also with a recent admit for new CM/HFrEF in Feb. He is up 30 lb within the past 2 weeks. He is on apixaban PTA for a large LV mural thrombus on ECHO 08/16/19. Pharmacy consulted to begin IV heparin while apixaban on hold for [facility]. Marland Kitchen Renal function is normal. No bleeding noted, CBC stable. .   HL 0.68- therapeutic APTT 72- therapeutic  Correlation with Heparin level and aPTT- can monitor based on just heparin levels now.    Goal of Therapy:  Heparin level 0.3-0.7 Monitor platelets by anticoagulation protocol: Yes   Plan:  Continue heparin infusion at 1200 units/hr  Daily HL, CBC Monitor for s/sx of bleeding  Thank you for involving pharmacy in this patient's care.  Margot Ables, PharmD Clinical Pharmacist 09/15/2019 7:36 AM

## 2019-09-15 NOTE — Progress Notes (Signed)
Progress Note  Patient Name: Stanley Levy Date of Encounter: 09/15/2019  Primary Cardiologist: Kate Sable, MD   Subjective   Feeling better, less short of breath. Still orthopneic. Denies chest pain.  Inpatient Medications    Scheduled Meds: . digoxin  0.125 mg Oral Daily  . furosemide  60 mg Intravenous Q12H  . gabapentin  100 mg Oral TID  . levothyroxine  37.5 mcg Oral Q0600  . lisinopril  5 mg Oral Daily  . potassium chloride  40 mEq Oral Daily  . rosuvastatin  5 mg Oral QPM  . spironolactone  12.5 mg Oral Daily  . tamsulosin  0.4 mg Oral QPC supper   Continuous Infusions: . heparin 1,200 Units/hr (09/14/19 2246)   PRN Meds: acetaminophen **OR** acetaminophen, ondansetron **OR** ondansetron (ZOFRAN) IV, polyethylene glycol   Vital Signs    Vitals:   09/14/19 1943 09/14/19 2046 09/15/19 0649 09/15/19 0739  BP:  123/86    Pulse:  (!) 101    Resp:  16    Temp:  98 F (36.7 C)    TempSrc:  Oral    SpO2: 92% 100%  94%  Weight:   103 kg   Height:        Intake/Output Summary (Last 24 hours) at 09/15/2019 0915 Last data filed at 09/15/2019 0650 Gross per 24 hour  Intake 529.75 ml  Output 6500 ml  Net -5970.25 ml   Filed Weights   09/13/19 1930 09/14/19 0500 09/15/19 0649  Weight: 105.6 kg 106.3 kg 103 kg    Telemetry    NSR with PVC's - Personally Reviewed  ECG    NA - Personally Reviewed  Physical Exam   GEN: No acute distress.   Neck: No JVD Cardiac: RRR, no murmurs, rubs, or gallops.  Respiratory: Clear to auscultation bilaterally. GI: Soft, nontender, non-distended  MS: 2+ pitting bilateral LE edema; No deformity. Neuro:  Nonfocal  Psych: Normal affect   Labs    Chemistry Recent Labs  Lab 09/13/19 1600 09/14/19 0650 09/15/19 0630  NA 135 139 137  K 3.6 3.3* 3.7  CL 101 105 101  CO2 25 27 27   GLUCOSE 297* 53* 224*  BUN 20 18 18   CREATININE 0.90 0.76 0.90  CALCIUM 8.4* 8.6* 8.4*  PROT 6.3*  --   --   ALBUMIN 3.0*  --    --   AST 26  --   --   ALT 41  --   --   ALKPHOS 362*  --   --   BILITOT 0.8  --   --   GFRNONAA >60 >60 >60  GFRAA >60 >60 >60  ANIONGAP 9 7 9      Hematology Recent Labs  Lab 09/13/19 1600 09/14/19 0650 09/15/19 0630  WBC 5.5 6.1 5.2  RBC 3.70* 3.74* 3.86*  HGB 11.8* 11.7* 12.1*  HCT 37.4* 37.0* 38.7*  MCV 101.1* 98.9 100.3*  MCH 31.9 31.3 31.3  MCHC 31.6 31.6 31.3  RDW 15.7* 15.5 15.9*  PLT 215 201 195    Cardiac EnzymesNo results for input(s): TROPONINI in the last 168 hours. No results for input(s): TROPIPOC in the last 168 hours.   BNP Recent Labs  Lab 09/13/19 1600  BNP 1,082.0*     DDimer No results for input(s): DDIMER in the last 168 hours.   Radiology    DG Chest Port 1 View  Result Date: 09/13/2019 CLINICAL DATA:  Short of breath EXAM: PORTABLE CHEST 1 VIEW COMPARISON:  08/24/2019,  CT 08/15/2019 FINDINGS: Small right greater than left pleural effusions with possible loculation on the right. Cardiomegaly with vascular congestion. Patchy airspace opacity at the left base. No pneumothorax. IMPRESSION: 1. Cardiomegaly with vascular congestion and small right greater than left pleural effusions. 2. Patchy atelectasis or pneumonia at the left base. Electronically Signed   By: Jasmine Pang M.D.   On: 09/13/2019 16:08    Cardiac Studies   Echocardiogram: 08/2019 IMPRESSIONS    1. Left ventricular ejection fraction, by estimation, is 20%. The left  ventricle has severely decreased function. The left ventricle demonstrates  global hypokinesis with regional variation.  2. Large LV mural thrombus in distal anterolateral position,  approximately 2.0 x 2.5 cm (trabecular muscle obscures extent to some  degree). Reported to Dr. Mariea Clonts.  3. Right ventricular systolic function is moderately reduced. The right  ventricular size is normal. There is mildly elevated pulmonary artery  systolic pressure. The estimated right ventricular systolic pressure is  32.4  mmHg.  4. Left atrial size was mildly dilated.  5. Right atrial size was moderately dilated.  6. The mitral valve is grossly normal. Trivial mitral valve  regurgitation.  7. The aortic valve is tricuspid. Aortic valve regurgitation is not  visualized.  8. The inferior vena cava is normal in size with greater than 50%  respiratory variability, suggesting right atrial pressure of 3 mmHg.   Patient Profile     64 y.o. male with PMH of chronic systolic CHF (EF 93% by echo in 08/2019), LV Mural Thrombus (on Eliquis PTA), HTN, HLD, Uncontrolled IDDM, Hypothyroidism and history of CVA who was sent directly to the ED from the office on 09/13/2019 for 30 lb weight gain within the past 2 weeks and significant volume overload.    Assessment & Plan    1. Acute on chronic systolic heart failure: Echo last month showed an EF of 20% with Global HK and he wished to defer further cardiac workup to the outpatient setting. Presented to the office for follow-up and weight had increased by 28 lbs within 2 weeks (weight had been 213 lbs at the time of hospital discharge in 08/2019, at 241 lbs on the office scales).  BNP elevated to 1082 on admission.CXR showed cardiomegaly with vascular congestion and bilateral effusions. He was started on IV Lasix 40mg  BID which was increased to 60 mg IV BID on 3/17. Lisinopril increased to 5 mg daily on 3/16. Still markedly volume overloaded but net 5.37 L output in last 24 hrs. Continue digoxin 0.125mg  daily (dig level 0.5 this admission) and spironolactone 12.5mg  daily. Not on beta blocker given concern for low-output.  Once he is no longer orthopneic, will plan to transfer to [facility] for right and left heart catheterization/coronary angiography.  2. LV thrombus: Echocardiogram last month showed an LV thrombus measuring 2.0 x 2.5 cm. He was started on Eliquis that admission given concerns for noncompliance with Coumadin. Eliquis currently held in anticipation of  catheterization and being bridged with Heparin.   3. HTN: BP is normal. No changes to meds.  4. Uncontrolled IDDM: Hgb A1c >18.5 in 08/2019. Management per admitting team.  5. Uncontrolled hypothyroidism: TSH elevated to 15.913 in 08/2019, 13.7 on 09/13/19. Has been continued on PTA Synthroid.      For questions or updates, please contact [facility] Please consult [email or web address] for contact info under Cardiology/STEMI.      Signed, 09/15/19, MD  09/15/2019, 9:15 AM

## 2019-09-15 NOTE — Progress Notes (Signed)
PROGRESS NOTE [name]   Stanley Levy  UKG:[id number]  DOB: 13-Nov-1955  DOA: 09/13/2019 PCP: Stanley Mayo, NP   Brief Admission Hx: 64 year old gentleman with low output systolic congestive heart failure EF 20%, hypertension, diabetes, poor compliance with medication was sent from cardiology office with progressive lower extremity edema and shortness of breath.  MDM/Assessment & Plan:   1. HFrEF -EF 20% from last echo 2/16.  The patient remains very volume overloaded and is being treated with IV Lasix diuresis.  The plan is for him to be with her diuresed and stabilized and then transferred to [facility] for heart catheterization.  Continue monitoring intake output and weights continue fluid restriction, follow cardiology recommendations.  He continues to diurese well on IV lasix.  2. Large LV thrombus and intracardiac thrombosis-patient remains fully anticoagulated with IV heparin as his apixaban is on hold. 3. Cerebrovascular disease status post history of CVA-he is on apixaban for full anticoagulation and Crestor. 4. Hypothyroidism-he is on home levothyroxine.  He recently had a markedly elevated TSH of 15.9. 5. Type 2 DM with severe hypoglycemia from insulin -patient had severe hypoglycemic reaction this morning from 70/30 insulin.  He likely is not taking it consistently at home with his history of poor compliance.  I have discontinued all insulin at this time we will monitor his blood sugars and come up with a plan to keep his blood sugars in the 140-180 range if possible without causing hypoglycemia.  I will likely start with a sensitive SSI and monitor his response.    Added prandial coverage.    DVT prophylaxis: Heparin Code Status: DNR Family Communication: updated patient at bedside, verbalizes understanding Disposition Plan: Continue inpatient management with plans to transfer to [facility] for cardiac catheterization when medically  optimized   Consultants:  Cardiology  Procedures:    Antimicrobials:     Subjective: Patient says he feels a lot better, he is diuresing well   Objective: Vitals:   09/15/19 0649 09/15/19 0739 09/15/19 1100 09/15/19 1348  BP:    (!) 100/58  Pulse:    90  Resp:    19  Temp:    98.4 F (36.9 C)  TempSrc:    Oral  SpO2:  94%  99%  Weight: 103 kg  99.6 kg   Height:        Intake/Output Summary (Last 24 hours) at 09/15/2019 1630 Last data filed at 09/15/2019 1100 Gross per 24 hour  Intake 480 ml  Output 7100 ml  Net -6620 ml   Filed Weights   09/14/19 0500 09/15/19 0649 09/15/19 1100  Weight: 106.3 kg 103 kg 99.6 kg   REVIEW OF SYSTEMS  As per history otherwise all reviewed and reported negative  Exam:  General exam: Chronically ill-appearing male awake and alert in no acute distress cooperative. Respiratory system: rare crackles. No increased work of breathing. Cardiovascular system: S1 & S2 heard. No JVD, murmurs, gallops, clicks or pedal edema. Gastrointestinal system: Abdomen is nondistended, soft and nontender. Normal bowel sounds heard. Central nervous system: Alert and oriented. No focal neurological deficits. Extremities: 3+ pitting edema bilateral lower extremities.  Data Reviewed: Basic Metabolic Panel: Recent Labs  Lab 09/13/19 1600 09/14/19 0650 09/15/19 0630  NA 135 139 137  K 3.6 3.3* 3.7  CL 101 105 101  CO2 25 27 27   GLUCOSE 297* 53* 224*  BUN 20 18 18   CREATININE 0.90 0.76 0.90  CALCIUM 8.4* 8.6* 8.4*   Liver Function  Tests: Recent Labs  Lab 09/13/19 1600  AST 26  ALT 41  ALKPHOS 362*  BILITOT 0.8  PROT 6.3*  ALBUMIN 3.0*   No results for input(s): LIPASE, AMYLASE in the last 168 hours. No results for input(s): AMMONIA in the last 168 hours. CBC: Recent Labs  Lab 09/13/19 1600 09/14/19 0650 09/15/19 0630  WBC 5.5 6.1 5.2  NEUTROABS 3.7  --   --   HGB 11.8* 11.7* 12.1*  HCT 37.4* 37.0* 38.7*  MCV 101.1* 98.9  100.3*  PLT 215 201 195   Cardiac Enzymes: No results for input(s): CKTOTAL, CKMB, CKMBINDEX, TROPONINI in the last 168 hours. CBG (last 3)  Recent Labs    09/15/19 0309 09/15/19 0732 09/15/19 1112  GLUCAP 224* 194* 312*   Recent Results (from the past 240 hour(s))  SARS CORONAVIRUS 2 (TAT 6-24 HRS) Nasopharyngeal Nasopharyngeal Swab     Status: None   Collection Time: 09/13/19  5:02 PM   Specimen: Nasopharyngeal Swab  Result Value Ref Range Status   SARS Coronavirus 2 NEGATIVE NEGATIVE Final    Comment: (NOTE) SARS-CoV-2 target nucleic acids are NOT DETECTED. The SARS-CoV-2 RNA is generally detectable in upper and lower respiratory specimens during the acute phase of infection. Negative results do not preclude SARS-CoV-2 infection, do not rule out co-infections with other pathogens, and should not be used as the sole basis for treatment or other patient management decisions. Negative results must be combined with clinical observations, patient history, and epidemiological information. The expected result is Negative. Fact Sheet for Patients: HairSlick.no Fact Sheet for Healthcare Providers: quierodirigir.com This test is not yet approved or cleared by the [place] FDA and  has been authorized for detection and/or diagnosis of SARS-CoV-2 by FDA under an Emergency Use Authorization (EUA). This EUA will remain  in effect (meaning this test can be used) for the duration of the COVID-19 declaration under Section 56 4(b)(1) of the Act, 21 U.S.C. section 360bbb-3(b)(1), unless the authorization is terminated or revoked sooner. Performed at [facility], [address] [place], [place] [address]      Studies: No results found.   Scheduled Meds: . digoxin  0.125 mg Oral Daily  . furosemide  60 mg Intravenous Q12H  . gabapentin  100 mg Oral TID  . insulin aspart  0-9 Units Subcutaneous TID WC  . insulin  aspart  3 Units Subcutaneous TID WC  . levothyroxine  37.5 mcg Oral Q0600  . lisinopril  5 mg Oral Daily  . potassium chloride  40 mEq Oral Daily  . rosuvastatin  5 mg Oral QPM  . spironolactone  12.5 mg Oral Daily  . tamsulosin  0.4 mg Oral QPC supper   Continuous Infusions: . heparin 1,200 Units/hr (09/14/19 2246)    Active Problems:   Uncontrolled type 2 diabetes mellitus with hyperglycemia (HCC)   Acute on chronic systolic (congestive) heart failure (HCC)  Time spent:   Standley Dakins, MD [facility] 09/15/2019, 4:30 PM    LOS: 2 days  How to contact the [facility] Attending or Consulting provider 7A - 7P or covering provider during after hours 7P -7A, for this patient?  1. Check the care team in [facility] and look for a) attending/consulting [facility] provider listed and b) the [facility] team listed 2. Log into [email or web address] and use [facility]'s universal password to access. If you do not have the password, please contact the hospital operator. 3. Locate the [facility] provider you are looking for under [facility]  and page to a number that you can be directly reached. 4. If you still have difficulty reaching the provider, please page the [facility] (Director on Call) for the Hospitalists listed on amion for assistance.

## 2019-09-15 NOTE — Progress Notes (Signed)
ANTICOAGULATION CONSULT NOTE - Follow Up Consult  Pharmacy Consult for heparin Indication: LV mural thrombus  Labs: Recent Labs    09/13/19 1600 09/13/19 1811 09/13/19 1811 09/13/19 2041 09/14/19 0650 09/14/19 1456 09/14/19 2247  HGB 11.8*  --   --   --  11.7*  --   --   HCT 37.4*  --   --   --  37.0*  --   --   PLT 215  --   --   --  201  --   --   APTT  --  36   < >  --  133* 72* 100*  HEPARINUNFRC  --   --   --  1.75* 1.60*  --   --   CREATININE 0.90  --   --   --  0.76  --   --   TROPONINIHS 20* 25*  --   --   --   --   --    < > = values in this interval not displayed.    Assessment/Plan:  64yo male remains therapeutic on heparin though now at upper end of goal; would prefer higher end given recent mural thrombus so will continue gtt at current rate and confirm stable with am labs.   Vernard Gambles, PharmD, BCPS  09/15/2019,2:12 AM

## 2019-09-16 LAB — BASIC METABOLIC PANEL
Anion gap: 9 (ref 5–15)
BUN: 17 mg/dL (ref 8–23)
CO2: 29 mmol/L (ref 22–32)
Calcium: 8.4 mg/dL — ABNORMAL LOW (ref 8.9–10.3)
Chloride: 100 mmol/L (ref 98–111)
Creatinine, Ser: 0.82 mg/dL (ref 0.61–1.24)
GFR calc Af Amer: >60 mL/min (ref >60)
GFR calc non Af Amer: >60 mL/min (ref >60)
Glucose, Bld: 286 mg/dL — ABNORMAL HIGH (ref 70–99)
Potassium: 3.6 mmol/L (ref 3.5–5.1)
Sodium: 138 mmol/L (ref 135–145)

## 2019-09-16 LAB — CBC
HCT: 38.4 % — ABNORMAL LOW (ref 39.0–52.0)
Hemoglobin: 12.4 g/dL — ABNORMAL LOW (ref 13.0–17.0)
MCH: 31.6 pg (ref 26.0–34.0)
MCHC: 32.3 g/dL (ref 30.0–36.0)
MCV: 98 fL (ref 80.0–100.0)
Platelets: 185 10*3/uL (ref 150–400)
RBC: 3.92 MIL/uL — ABNORMAL LOW (ref 4.22–5.81)
RDW: 15.5 % (ref 11.5–15.5)
WBC: 5.6 10*3/uL (ref 4.0–10.5)
nRBC: 0 % (ref 0.0–0.2)

## 2019-09-16 LAB — GLUCOSE, CAPILLARY
Glucose-Capillary: 307 mg/dL — ABNORMAL HIGH (ref 70–99)
Glucose-Capillary: 147 mg/dL — ABNORMAL HIGH (ref 70–99)
Glucose-Capillary: 179 mg/dL — ABNORMAL HIGH (ref 70–99)

## 2019-09-16 LAB — APTT: aPTT: 183 seconds (ref 24–36)

## 2019-09-16 LAB — MAGNESIUM: Magnesium: 1.6 mg/dL — ABNORMAL LOW (ref 1.7–2.4)

## 2019-09-16 LAB — HEPARIN LEVEL (UNFRACTIONATED)
Heparin Unfractionated: 0.72 IU/mL — ABNORMAL HIGH (ref 0.30–0.70)
Heparin Unfractionated: 0.59 IU/mL (ref 0.30–0.70)

## 2019-09-16 MED ORDER — POTASSIUM CHLORIDE CRYS ER 20 MEQ PO TBCR
20.0000 meq | EXTENDED_RELEASE_TABLET | Freq: Once | ORAL | Status: AC
  Administered 2019-09-16: 20 meq via ORAL
  Filled 2019-09-16: qty 1

## 2019-09-16 MED ORDER — INSULIN ASPART 100 UNIT/ML ~~LOC~~ SOLN
8.0000 [IU] | Freq: Three times a day (TID) | SUBCUTANEOUS | Status: DC
  Administered 2019-09-16: 5 [IU] via SUBCUTANEOUS

## 2019-09-16 MED ORDER — MAGNESIUM SULFATE 2 GM/50ML IV SOLN
2.0000 g | Freq: Once | INTRAVENOUS | Status: AC
  Administered 2019-09-16: 2 g via INTRAVENOUS
  Filled 2019-09-16: qty 50

## 2019-09-16 MED ORDER — INSULIN ASPART 100 UNIT/ML ~~LOC~~ SOLN
5.0000 [IU] | Freq: Three times a day (TID) | SUBCUTANEOUS | Status: DC
  Administered 2019-09-16 (×2): 5 [IU] via SUBCUTANEOUS

## 2019-09-16 MED ORDER — INSULIN GLARGINE 100 UNIT/ML ~~LOC~~ SOLN
12.0000 [IU] | Freq: Every day | SUBCUTANEOUS | Status: DC
  Administered 2019-09-16 – 2019-09-21 (×6): 12 [IU] via SUBCUTANEOUS
  Filled 2019-09-16 (×8): qty 0.12

## 2019-09-16 MED ORDER — FUROSEMIDE 10 MG/ML IJ SOLN
40.0000 mg | Freq: Two times a day (BID) | INTRAMUSCULAR | Status: DC
  Administered 2019-09-16 – 2019-09-17 (×2): 40 mg via INTRAVENOUS
  Filled 2019-09-16 (×2): qty 4

## 2019-09-16 NOTE — Progress Notes (Signed)
[facility] Documentation    Pt is a current pt in [facility] Palliative program. Liaison to follow pt through course of hospital stay to ensure follow up with Palliative NP upon discharge.     Please outreach with any questions.     Thank you,   Jennifer Love, RN  [facility] Hospital Liaison  [phone]   

## 2019-09-16 NOTE — Care Management Important Message (Signed)
Important Message  Patient Details  Name: Stanley Levy MRN: [id number] Date of Birth: 11-13-1955   Medicare Important Message Given:  Yes     Corey Harold 09/16/2019, 3:48 PM

## 2019-09-16 NOTE — Progress Notes (Signed)
ANTICOAGULATION CONSULT NOTE -   Pharmacy Consult for heparin Indication: LV mural thrombus   No Known Allergies  Patient Measurements: Height: 5' 9"  (175.3 cm) Weight: 211 lb 9.6 oz (96 kg) IBW/kg (Calculated) : 70.7 Heparin Dosing Weight: 94.7 kg  Vital Signs: Temp: 97.5 F (36.4 C) (03/19 1345) Temp Source: Oral (03/19 1345) BP: 94/67 (03/19 1345) Pulse Rate: 91 (03/19 1345)  Labs: Recent Labs    09/13/19 1811 09/13/19 2041 09/14/19 0650 09/14/19 0650 09/14/19 1456 09/14/19 2247 09/15/19 0630 09/16/19 0625 09/16/19 1515  HGB  --   --  11.7*   < >  --   --  12.1* 12.4*  --   HCT  --   --  37.0*  --   --   --  38.7* 38.4*  --   PLT  --   --  201  --   --   --  195 185  --   APTT 36   < > 133*  --    < > 100* 79* 183*  --   HEPARINUNFRC  --    < > 1.60*   < >  --   --  0.68 0.72* 0.59  CREATININE  --   --  0.76  --   --   --  0.90 0.82  --   TROPONINIHS 25*  --   --   --   --   --   --   --   --    < > = values in this interval not displayed.    Estimated Creatinine Clearance: 102.6 mL/min (by C-G formula based on SCr of 0.82 mg/dL).   Medical History: Past Medical History:  Diagnosis Date  . Anxiety   . CHF (congestive heart failure) (HCC)    a. EF 20% with global HK by echo in 08/2019. Also noted to have LV mural thrombus.   . Diabetes mellitus, type II ([place])   . Hyperlipidemia   . Thyroid disease     Medications:  Medications Prior to Admission  Medication Sig Dispense Refill Last Dose  . acetaminophen (TYLENOL) 325 MG tablet Take 2 tablets (650 mg total) by mouth every 4 (four) hours as needed for headache or mild pain. 12 tablet 0 09/12/2019 at Unknown time  . apixaban (ELIQUIS) 5 MG TABS tablet Take 1 tablet (5 mg total) by mouth 2 (two) times daily. 60 tablet 3 09/13/2019 at 1000  . blood glucose meter kit and supplies KIT Dispense based on patient and insurance preference. Use up to four times daily as directed. (FOR ICD-9 250.00, 250.01). 1 each 0    . blood glucose meter kit and supplies Dispense based on patient and insurance preference. Use up to four times daily as directed. (FOR ICD-10 E10.9, E11.9). 1 each 0   . digoxin (LANOXIN) 0.125 MG tablet Take 1 tablet (0.125 mg total) by mouth daily. 30 tablet 3 09/13/2019 at 1000  . furosemide (LASIX) 20 MG tablet Take 20 mg by mouth 2 (two) times daily.   09/13/2019 at Unknown time  . gabapentin (NEURONTIN) 100 MG capsule Take 1 capsule (100 mg total) by mouth 3 (three) times daily. 90 capsule 2 09/13/2019 at Unknown time  . glucose blood (GLUCOSE METER TEST) test strip Use as instructed 400 each 3   . insulin aspart protamine - aspart (NOVOLOG MIX 70/30 FLEXPEN) (70-30) 100 UNIT/ML FlexPen Inject 0.25 mLs (25 Units total) into the skin 2 (two) times daily with a meal. 15  mL 11 09/13/2019 at Unknown time  . Insulin Pen Needle 31G X 5 MM MISC 1 Units by Does not apply route 2 (two) times daily. 100 each 5   . levothyroxine (SYNTHROID) 75 MCG tablet Take 0.5 tablets (37.5 mcg total) by mouth daily before breakfast. 15 tablet 3 09/13/2019 at Unknown time  . lisinopril (ZESTRIL) 2.5 MG tablet Take 1 tablet (2.5 mg total) by mouth daily. 30 tablet 5 09/13/2019 at Unknown time  . OneTouch Delica Lancets 41Y MISC Four times daily testing  dx e11.65 150 each 5   . rosuvastatin (CRESTOR) 5 MG tablet Take 1 tablet (5 mg total) by mouth every evening. 30 tablet 3 09/12/2019 at Unknown time  . spironolactone (ALDACTONE) 25 MG tablet Take 0.5 tablets (12.5 mg total) by mouth daily. 15 tablet 3 09/13/2019 at Unknown time  . tamsulosin (FLOMAX) 0.4 MG CAPS capsule Take 1 capsule (0.4 mg total) by mouth daily after supper. 30 capsule 3 09/12/2019 at Unknown time  . Vitamin D, Ergocalciferol, (DRISDOL) 1.25 MG (50000 UNIT) CAPS capsule Take 1 capsule (50,000 Units total) by mouth every 7 (seven) days. 5 capsule 2 09/13/2019 at Unknown time    Assessment: 15 yom sent to the [facility] from Cardiology office for acute on  chronic CHF; also with a recent admit for new CM/HFrEF in Feb. He is up 30 lb within the past 2 weeks. He is on apixaban PTA for a large LV mural thrombus on ECHO 08/16/19. Pharmacy consulted to begin IV heparin while apixaban on hold for [facility]. Marland Kitchen Renal function is normal. No bleeding noted, CBC stable. Marland Kitchen   anticipate [facility] on Monday with possible transfer to [facility] over the weekend pending bed status  HL 0.59- therapeutic   Correlation with Heparin level and aPTT- can monitor based on just heparin levels now.    Goal of Therapy:  Heparin level 0.3-0.7 Monitor platelets by anticoagulation protocol: Yes   Plan:  Continue heparin infusion at 1100 units/hr  Heparin level daily  Monitor CBC and s/sx of bleeding  Thank you for involving pharmacy in this patient's care.  Thomasenia Sales, PharmD, MBA, BCGP Clinical Pharmacist  09/16/2019 4:24 PM

## 2019-09-16 NOTE — Progress Notes (Signed)
ANTICOAGULATION CONSULT NOTE -   Pharmacy Consult for heparin Indication: LV mural thrombus   No Known Allergies  Patient Measurements: Height: 5' 9"  (175.3 cm) Weight: 212 lb 1.3 oz (96.2 kg) IBW/kg (Calculated) : 70.7 Heparin Dosing Weight: 94.7 kg  Vital Signs: Temp: 98 F (36.7 C) (03/19 0525) Temp Source: Oral (03/19 0525) BP: 130/83 (03/19 0525) Pulse Rate: 90 (03/19 0525)  Labs: Recent Labs     0000 09/13/19 1600 09/13/19 1811 09/13/19 2041 09/14/19 0650 09/14/19 0650 09/14/19 1456 09/14/19 2247 09/15/19 0630 09/16/19 0625  HGB   < > 11.8*  --   --  11.7*   < >  --   --  12.1* 12.4*  HCT   < > 37.4*  --   --  37.0*  --   --   --  38.7* 38.4*  PLT   < > 215  --   --  201  --   --   --  195 185  APTT  --   --  36   < > 133*  --    < > 100* 79* 183*  HEPARINUNFRC  --   --   --    < > 1.60*  --   --   --  0.68 0.72*  CREATININE   < > 0.90  --   --  0.76  --   --   --  0.90 0.82  TROPONINIHS  --  20* 25*  --   --   --   --   --   --   --    < > = values in this interval not displayed.    Estimated Creatinine Clearance: 102.8 mL/min (by C-G formula based on SCr of 0.82 mg/dL).   Medical History: Past Medical History:  Diagnosis Date  . Anxiety   . CHF (congestive heart failure) (HCC)    a. EF 20% with global HK by echo in 08/2019. Also noted to have LV mural thrombus.   . Diabetes mellitus, type II ([place])   . Hyperlipidemia   . Thyroid disease     Medications:  Medications Prior to Admission  Medication Sig Dispense Refill Last Dose  . acetaminophen (TYLENOL) 325 MG tablet Take 2 tablets (650 mg total) by mouth every 4 (four) hours as needed for headache or mild pain. 12 tablet 0 09/12/2019 at Unknown time  . apixaban (ELIQUIS) 5 MG TABS tablet Take 1 tablet (5 mg total) by mouth 2 (two) times daily. 60 tablet 3 09/13/2019 at 1000  . blood glucose meter kit and supplies KIT Dispense based on patient and insurance preference. Use up to four times daily as  directed. (FOR ICD-9 250.00, 250.01). 1 each 0   . blood glucose meter kit and supplies Dispense based on patient and insurance preference. Use up to four times daily as directed. (FOR ICD-10 E10.9, E11.9). 1 each 0   . digoxin (LANOXIN) 0.125 MG tablet Take 1 tablet (0.125 mg total) by mouth daily. 30 tablet 3 09/13/2019 at 1000  . furosemide (LASIX) 20 MG tablet Take 20 mg by mouth 2 (two) times daily.   09/13/2019 at Unknown time  . gabapentin (NEURONTIN) 100 MG capsule Take 1 capsule (100 mg total) by mouth 3 (three) times daily. 90 capsule 2 09/13/2019 at Unknown time  . glucose blood (GLUCOSE METER TEST) test strip Use as instructed 400 each 3   . insulin aspart protamine - aspart (NOVOLOG MIX 70/30 FLEXPEN) (70-30) 100 UNIT/ML  FlexPen Inject 0.25 mLs (25 Units total) into the skin 2 (two) times daily with a meal. 15 mL 11 09/13/2019 at Unknown time  . Insulin Pen Needle 31G X 5 MM MISC 1 Units by Does not apply route 2 (two) times daily. 100 each 5   . levothyroxine (SYNTHROID) 75 MCG tablet Take 0.5 tablets (37.5 mcg total) by mouth daily before breakfast. 15 tablet 3 09/13/2019 at Unknown time  . lisinopril (ZESTRIL) 2.5 MG tablet Take 1 tablet (2.5 mg total) by mouth daily. 30 tablet 5 09/13/2019 at Unknown time  . OneTouch Delica Lancets 44H MISC Four times daily testing  dx e11.65 150 each 5   . rosuvastatin (CRESTOR) 5 MG tablet Take 1 tablet (5 mg total) by mouth every evening. 30 tablet 3 09/12/2019 at Unknown time  . spironolactone (ALDACTONE) 25 MG tablet Take 0.5 tablets (12.5 mg total) by mouth daily. 15 tablet 3 09/13/2019 at Unknown time  . tamsulosin (FLOMAX) 0.4 MG CAPS capsule Take 1 capsule (0.4 mg total) by mouth daily after supper. 30 capsule 3 09/12/2019 at Unknown time  . Vitamin D, Ergocalciferol, (DRISDOL) 1.25 MG (50000 UNIT) CAPS capsule Take 1 capsule (50,000 Units total) by mouth every 7 (seven) days. 5 capsule 2 09/13/2019 at Unknown time    Assessment: 63 yom sent to the  [facility] office for acute on chronic CHF; also with a recent admit for new CM/HFrEF in Feb. He is up 30 lb within the past 2 weeks. He is on apixaban PTA for a large LV mural thrombus on ECHO 08/16/19. Pharmacy consulted to begin IV heparin while apixaban on hold for [facility]. Marland Kitchen Renal function is normal. No bleeding noted, CBC stable. Marland Kitchen   anticipate [facility] on Monday with possible transfer to [facility] over the weekend pending bed status  HL 0.72- slightly supratherapeutic   Correlation with Heparin level and aPTT- can monitor based on just heparin levels now.    Goal of Therapy:  Heparin level 0.3-0.7 Monitor platelets by anticoagulation protocol: Yes   Plan:  Decrease heparin infusion to 1100 units/hr  Heparin level in 6 hours and daily  Monitor CBC and s/sx of bleeding  Thank you for involving pharmacy in this patient's care.  Margot Ables, PharmD Clinical Pharmacist 09/16/2019 9:10 AM

## 2019-09-16 NOTE — Progress Notes (Addendum)
Progress Note  Patient Name: Stanley Levy Date of Encounter: 09/16/2019  Primary Cardiologist: Kate Sable, MD   Subjective   Breathing improved. He denies any chest pain or palpitations. Still with significant lower extremity edema and weeping along lower extremities but improved from admission.   Inpatient Medications    Scheduled Meds: . digoxin  0.125 mg Oral Daily  . furosemide  60 mg Intravenous Q12H  . gabapentin  100 mg Oral TID  . insulin aspart  0-9 Units Subcutaneous TID WC  . insulin aspart  5 Units Subcutaneous TID WC  . insulin glargine  12 Units Subcutaneous Daily  . levothyroxine  37.5 mcg Oral Q0600  . lisinopril  5 mg Oral Daily  . potassium chloride  40 mEq Oral Daily  . rosuvastatin  5 mg Oral QPM  . spironolactone  12.5 mg Oral Daily  . tamsulosin  0.4 mg Oral QPC supper   Continuous Infusions: . heparin 1,200 Units/hr (09/15/19 2237)   PRN Meds: acetaminophen **OR** acetaminophen, ondansetron **OR** ondansetron (ZOFRAN) IV, polyethylene glycol   Vital Signs    Vitals:   09/15/19 1920 09/15/19 2002 09/16/19 0327 09/16/19 0525  BP:  114/69  130/83  Pulse:  92  90  Resp:  15  18  Temp:  98 F (36.7 C)  98 F (36.7 C)  TempSrc:  Oral  Oral  SpO2: 98% 100%  92%  Weight:   96.2 kg   Height:        Intake/Output Summary (Last 24 hours) at 09/16/2019 0805 Last data filed at 09/16/2019 0436 Gross per 24 hour  Intake 240 ml  Output 5900 ml  Net -5660 ml    Last 3 Weights 09/16/2019 09/15/2019 09/15/2019  Weight (lbs) 212 lb 1.3 oz 219 lb 9.6 oz 227 lb 1.2 oz  Weight (kg) 96.2 kg 99.61 kg 103 kg  Some encounter information is confidential and restricted. Go to Review Flowsheets activity to see all data.      Telemetry    NSR, HR in 70's to 80's. Frequent PVC's, sometimes occurring in a couplet pattern. Occasional episodes of ventricular bigeminy.  - Personally Reviewed  ECG    No new tracings.   Physical Exam   General: Well  developed, well nourished, male appearing in no acute distress. Head: Normocephalic, atraumatic.  Neck: Supple without bruits, JVD at 9 cm. Lungs:  Resp regular and unlabored, decreased along bases. Heart: RRR, S1, S2, no S3, S4, or murmur; no rub. Abdomen: Soft, non-tender, non-distended with normoactive bowel sounds. No hepatomegaly. No rebound/guarding. No obvious abdominal masses. Extremities: No clubbing or cyanosis, 2+ pitting edema up to knees bilaterally. Distal pedal pulses are 2+ bilaterally. Neuro: Alert and oriented X 3. Moves all extremities spontaneously. Psych: Normal affect.  Labs    Chemistry Recent Labs  Lab 09/13/19 1600 09/13/19 1600 09/14/19 0650 09/15/19 0630 09/16/19 0625  NA 135   < > 139 137 138  K 3.6   < > 3.3* 3.7 3.6  CL 101   < > 105 101 100  CO2 25   < > 27 27 29   GLUCOSE 297*   < > 53* 224* 286*  BUN 20   < > 18 18 17   CREATININE 0.90   < > 0.76 0.90 0.82  CALCIUM 8.4*   < > 8.6* 8.4* 8.4*  PROT 6.3*  --   --   --   --   ALBUMIN 3.0*  --   --   --   --  AST 26  --   --   --   --   ALT 41  --   --   --   --   ALKPHOS 362*  --   --   --   --   BILITOT 0.8  --   --   --   --   GFRNONAA >60   < > >60 >60 >60  GFRAA >60   < > >60 >60 >60  ANIONGAP 9   < > 7 9 9    < > = values in this interval not displayed.     Hematology Recent Labs  Lab 09/14/19 0650 09/15/19 0630 09/16/19 0625  WBC 6.1 5.2 5.6  RBC 3.74* 3.86* 3.92*  HGB 11.7* 12.1* 12.4*  HCT 37.0* 38.7* 38.4*  MCV 98.9 100.3* 98.0  MCH 31.3 31.3 31.6  MCHC 31.6 31.3 32.3  RDW 15.5 15.9* 15.5  PLT 201 195 185    Cardiac EnzymesNo results for input(s): TROPONINI in the last 168 hours. No results for input(s): TROPIPOC in the last 168 hours.   BNP Recent Labs  Lab 09/13/19 1600  BNP 1,082.0*     DDimer No results for input(s): DDIMER in the last 168 hours.   Radiology    No results found.  Cardiac Studies   Echocardiogram: 08/2019 IMPRESSIONS    1. Left  ventricular ejection fraction, by estimation, is 20%. The left  ventricle has severely decreased function. The left ventricle demonstrates  global hypokinesis with regional variation.  2. Large LV mural thrombus in distal anterolateral position,  approximately 2.0 x 2.5 cm (trabecular muscle obscures extent to some  degree). Reported to Dr. 08/2019.  3. Right ventricular systolic function is moderately reduced. The right  ventricular size is normal. There is mildly elevated pulmonary artery  systolic pressure. The estimated right ventricular systolic pressure is  32.4 mmHg.  4. Left atrial size was mildly dilated.  5. Right atrial size was moderately dilated.  6. The mitral valve is grossly normal. Trivial mitral valve  regurgitation.  7. The aortic valve is tricuspid. Aortic valve regurgitation is not  visualized.  8. The inferior vena cava is normal in size with greater than 50%  respiratory variability, suggesting right atrial pressure of 3 mmHg.   Patient Profile     64 y.o. male w/PMH of chronic systolic CHF (EF 76 by echo in 08/2019), LV Mural Thrombus (on Eliquis PTA), HTN, HLD, Uncontrolled IDDM, Hypothyroidism and history of CVA who was sent directly to the ED from the office on 09/13/2019 for 30 lb weight gain within the past 2 weeks and significant volume overload.  Assessment & Plan    1. Acute on Chronic Systolic CHF - he has a known reduced EF of 20% from last admission and decided at that time to defer further workup to the outpatient setting but weight had increased by 28 lbs within 2 weeks at the time of his visit, which led to admission.  - weight was 213 lbs in 08/2019, elevated to 241 lbs at the time of admission. He is on IV Lasix 60 mg BID with a recorded net output of -11.9 L this far (-5.6L within the past 24 hours). Weight down to 212 lbs but he is still volume overloaded by examination so will need to establish a new dry weight. Creatinine stable at  0.82. - cardiac catheterization had been reviewed with the patient along with his family at the time of his office visit and he  is in agreement to proceed with the procedure this admission. Given volume status, would plan for continued IV diuresis over the weekend and anticipate [facility] on Monday with possible transfer to [facility] over the weekend pending bed status. Will review timing with Dr. Wyline Mood as well.  - continue Digoxin 0.125mg  daily, Lisinopril 2.5mg  daily and Spironolactone 12.5mg  daily. BP does not allow for further titration of medical therapy at this time. Not on BB given concern for low-output.   2. LV Thrombus - echo in 08/2019 showed an LV thrombus measuring 2.0 x 2.5 cm. On Eliquis for treatment given concern for noncompliance with Coumadin. Eliquis held and being bridged with Heparin in anticipation of cath. Would restart Eliquis following cath.   3.HTN -BP stable at 100/58 - 130/83 within the past 24 hours. Continue current medication regimen.   4. Uncontrolled IDDM - Hgb A1c >18.5 in 08/2019. Medication adjustments per admitting team.   5. Uncontrolled Hypothyroidism - TSH elevated to 15.913 in 08/2019. Has been continued on PTA Synthroid. TSH 13.723 this admission.   6. Ventricular Bigeminy - Mg 1.6 this AM and K+ 3.6. Will try to keep Mg ~ 2.0 and K+ ~ 4.0. Supplementation ordered.    For questions or updates, please contact [facility] Please consult [email or web address] for contact info under Cardiology/STEMI.   Lorri Frederick , PA-C 8:05 AM 09/16/2019 Pager: [phone]  Attending note Patient seen and disucssed with PA Iran Ouch, I agree with her documentation. 64 yo male history of chronic systolic HF LVEF 81%, fairly new diagnosis by echo 08/2019. He declined cath at that time. Admitted with severe volume overload, up 28 lbs within 2 weeks. Negative 5.6 L yesterday, neg 11.9 L since admission. He is on lasix 60mg  IV bid, renal function is stable.  High net negative diuresis, would lower lasix to 40mg  IV bid goal net negative 2-3 L per day. Once more euvolemic would plan for LHC/RHC. Medical therapy for systolic HF with digoxin 0.125, lisinopril 5mg , aldactone 12.5mg . Has not been on beta blocker due to some prior concern for low CO, can reassess once euvolemic and after RHC. On eliquis at home for history of LV thrombus due to concerns for compliance with coumadin, now on heparin in anticipation for cath.    MD

## 2019-09-16 NOTE — Progress Notes (Signed)
Stanley Levy  YOV:[id number]  DOB: June 07, 1956  DOA: 09/13/2019 PCP: Freddy Finner, NP   Brief Admission Hx: 64 year old gentleman with low output systolic congestive heart failure EF 20%, hypertension, diabetes, poor compliance with medication was sent from cardiology office with progressive lower extremity edema and shortness of breath.  MDM/Assessment & Plan:   1. HFrEF -EF 20% from last echo 2/16.  The patient remains very volume overloaded and is being treated with IV Lasix diuresis and continues slow improvement.  The plan is for him to be further diuresed and stabilized and then transferred to [facility] for heart catheterization.  Continue monitoring intake output and weights continue fluid restriction, follow cardiology recommendations.  He continues to diurese well on IV lasix.  2. Large LV thrombus and intracardiac thrombosis-patient remains fully anticoagulated with IV heparin as his apixaban is on hold. 3. Cerebrovascular disease status post history of CVA-he is on apixaban for full anticoagulation and Crestor. 4. Hypothyroidism-he is on home levothyroxine.  He recently had a markedly elevated TSH of 15.9. 5. Type 2 DM with severe hypoglycemia from insulin -patient had severe hypoglycemic reaction this morning from 70/30 insulin.  He likely is not taking it consistently at home with his history of poor compliance.  He has slowly been started on a basal bolus insulin program with supplemental sliding scale coverage.     DVT prophylaxis: Heparin Code Status: DNR Family Communication: updated patient at bedside, verbalizes understanding Disposition Plan: Continue inpatient management with plans to transfer to [facility] for cardiac catheterization when medically optimized   Consultants:  Cardiology  Procedures:    Antimicrobials:     Subjective: Patient without complaints.  No SOB, no CP, no palpitations.   Objective: Vitals:    09/16/19 0327 09/16/19 0525 09/16/19 0900 09/16/19 1345  BP:  130/83  94/67  Pulse:  90  91  Resp:  18  19  Temp:  98 F (36.7 C)  (!) 97.5 F (36.4 C)  TempSrc:  Oral  Oral  SpO2:  92%  100%  Weight: 96.2 kg  96 kg   Height:        Intake/Output Summary (Last 24 hours) at 09/16/2019 1415 Last data filed at 09/16/2019 1200 Gross per 24 hour  Intake 495 ml  Output 6820 ml  Net -6325 ml   Filed Weights   09/15/19 1100 09/16/19 0327 09/16/19 0900  Weight: 99.6 kg 96.2 kg 96 kg   REVIEW OF SYSTEMS  As per history otherwise all reviewed and reported negative  Exam:  General exam: Chronically ill-appearing male awake and alert in no acute distress cooperative. Respiratory system: rare crackles. No increased work of breathing. Cardiovascular system: S1 & S2 heard. No JVD, murmurs, gallops, clicks or pedal edema. Gastrointestinal system: Abdomen is nondistended, soft and nontender. Normal bowel sounds heard. Central nervous system: Alert and oriented. No focal neurological deficits. Extremities: 3+ pitting edema bilateral lower extremities.  Data Reviewed: Basic Metabolic Panel: Recent Labs  Lab 09/13/19 1600 09/14/19 0650 09/15/19 0630 09/16/19 0625  NA 135 139 137 138  K 3.6 3.3* 3.7 3.6  CL 101 105 101 100  CO2 25 27 27 29   GLUCOSE 297* 53* 224* 286*  BUN 20 18 18 17   CREATININE 0.90 0.76 0.90 0.82  CALCIUM 8.4* 8.6* 8.4* 8.4*  MG  --   --   --  1.6*   Liver Function Tests: Recent Labs  Lab 09/13/19 1600  AST  26  ALT 41  ALKPHOS 362*  BILITOT 0.8  PROT 6.3*  ALBUMIN 3.0*   No results for input(s): LIPASE, AMYLASE in the last 168 hours. No results for input(s): AMMONIA in the last 168 hours. CBC: Recent Labs  Lab 09/13/19 1600 09/14/19 0650 09/15/19 0630 09/16/19 0625  WBC 5.5 6.1 5.2 5.6  NEUTROABS 3.7  --   --   --   HGB 11.8* 11.7* 12.1* 12.4*  HCT 37.4* 37.0* 38.7* 38.4*  MCV 101.1* 98.9 100.3* 98.0  PLT 215 201 195 185   Cardiac  Enzymes: No results for input(s): CKTOTAL, CKMB, CKMBINDEX, TROPONINI in the last 168 hours. CBG (last 3)  Recent Labs    09/15/19 1642 09/15/19 2000 09/16/19 1205  GLUCAP 248* 268* 307*   Recent Results (from the past 240 hour(s))  SARS CORONAVIRUS 2 (TAT 6-24 HRS) Nasopharyngeal Nasopharyngeal Swab     Status: None   Collection Time: 09/13/19  5:02 PM   Specimen: Nasopharyngeal Swab  Result Value Ref Range Status   SARS Coronavirus 2 NEGATIVE NEGATIVE Final    Comment: (NOTE) SARS-CoV-2 target nucleic acids are NOT DETECTED. The SARS-CoV-2 RNA is generally detectable in upper and lower respiratory specimens during the acute phase of infection. Negative results do not preclude SARS-CoV-2 infection, do not rule out co-infections with other pathogens, and should not be used as the sole basis for treatment or other patient management decisions. Negative results must be combined with clinical observations, patient history, and epidemiological information. The expected result is Negative. Fact Sheet for Patients: SugarRoll.be Fact Sheet for Healthcare Providers: https://www.woods-mathews.com/ This test is not yet approved or cleared by the [place] FDA and  has been authorized for detection and/or diagnosis of SARS-CoV-2 by FDA under an Emergency Use Authorization (EUA). This EUA will remain  in effect (meaning this test can be used) for the duration of the COVID-19 declaration under Section 56 4(b)(1) of the Act, 21 U.S.C. section 360bbb-3(b)(1), unless the authorization is terminated or revoked sooner. Performed at [facility], [place] [address] [place], [place] [address]      Studies: No results found.   Scheduled Meds: . digoxin  0.125 mg Oral Daily  . furosemide  40 mg Intravenous Q12H  . gabapentin  100 mg Oral TID  . insulin aspart  0-9 Units Subcutaneous TID WC  . insulin aspart  8 Units Subcutaneous TID WC  .  insulin glargine  12 Units Subcutaneous Daily  . levothyroxine  37.5 mcg Oral Q0600  . lisinopril  5 mg Oral Daily  . potassium chloride  40 mEq Oral Daily  . rosuvastatin  5 mg Oral QPM  . spironolactone  12.5 mg Oral Daily  . tamsulosin  0.4 mg Oral QPC supper   Continuous Infusions: . heparin 1,100 Units/hr (09/16/19 0934)    Active Problems:   Uncontrolled type 2 diabetes mellitus with hyperglycemia (HCC)   Acute on chronic systolic (congestive) heart failure ([place])  Time spent:   Irwin Brakeman, MD [facility] 09/16/2019, 2:15 PM    LOS: 3 days  How to contact the [facility] Attending or Consulting provider [place] or covering provider during after hours [place], for this patient?  1. Check the care team in [facility] and look for a) attending/consulting TRH provider listed and b) the [facility] team listed 2. Log into [email or web address] and use [place]'s universal password to access. If you do not have the password, please contact the hospital operator. 3. Locate  the [facility] provider you are looking for under [facility] and page to a number that you can be directly reached. 4. If you still have difficulty reaching the provider, please page the [facility] (Director on Call) for the Hospitalists listed on amion for assistance.

## 2019-09-16 NOTE — Progress Notes (Signed)
CRITICAL VALUE ALERT  Critical Value:  183 PTT  Date & Time Notied:  09/16/2019 0900  Provider Notified: Dr Laural Benes   Orders Received/Actions taken: awaiting orders

## 2019-09-17 LAB — CBC
HCT: 36.6 % — ABNORMAL LOW (ref 39.0–52.0)
Hemoglobin: 11.8 g/dL — ABNORMAL LOW (ref 13.0–17.0)
MCH: 31.7 pg (ref 26.0–34.0)
MCHC: 32.2 g/dL (ref 30.0–36.0)
MCV: 98.4 fL (ref 80.0–100.0)
Platelets: 170 10*3/uL (ref 150–400)
RBC: 3.72 MIL/uL — ABNORMAL LOW (ref 4.22–5.81)
RDW: 15.3 % (ref 11.5–15.5)
WBC: 6.1 10*3/uL (ref 4.0–10.5)
nRBC: 0 % (ref 0.0–0.2)

## 2019-09-17 LAB — BASIC METABOLIC PANEL
Anion gap: 10 (ref 5–15)
BUN: 16 mg/dL (ref 8–23)
CO2: 31 mmol/L (ref 22–32)
Calcium: 8.6 mg/dL — ABNORMAL LOW (ref 8.9–10.3)
Chloride: 98 mmol/L (ref 98–111)
Creatinine, Ser: 0.85 mg/dL (ref 0.61–1.24)
GFR calc Af Amer: >60 mL/min (ref >60)
GFR calc non Af Amer: >60 mL/min (ref >60)
Glucose, Bld: 91 mg/dL (ref 70–99)
Potassium: 3.5 mmol/L (ref 3.5–5.1)
Sodium: 139 mmol/L (ref 135–145)

## 2019-09-17 LAB — GLUCOSE, CAPILLARY
Glucose-Capillary: 87 mg/dL (ref 70–99)
Glucose-Capillary: 80 mg/dL (ref 70–99)
Glucose-Capillary: 198 mg/dL — ABNORMAL HIGH (ref 70–99)
Glucose-Capillary: 166 mg/dL — ABNORMAL HIGH (ref 70–99)
Glucose-Capillary: 193 mg/dL — ABNORMAL HIGH (ref 70–99)

## 2019-09-17 LAB — HEPARIN LEVEL (UNFRACTIONATED): Heparin Unfractionated: 0.46 IU/mL (ref 0.30–0.70)

## 2019-09-17 LAB — MAGNESIUM: Magnesium: 1.8 mg/dL (ref 1.7–2.4)

## 2019-09-17 MED ORDER — INSULIN ASPART 100 UNIT/ML ~~LOC~~ SOLN: 6.0000 [IU] | Freq: Three times a day (TID) | SUBCUTANEOUS | Status: DC

## 2019-09-17 MED ORDER — FUROSEMIDE 10 MG/ML IJ SOLN
40.0000 mg | Freq: Every day | INTRAMUSCULAR | Status: DC
  Administered 2019-09-18 – 2019-09-19 (×2): 40 mg via INTRAVENOUS
  Filled 2019-09-17 (×2): qty 4

## 2019-09-17 MED ORDER — INSULIN ASPART 100 UNIT/ML ~~LOC~~ SOLN
5.0000 [IU] | Freq: Three times a day (TID) | SUBCUTANEOUS | Status: DC
  Administered 2019-09-17 – 2019-09-18 (×4): 5 [IU] via SUBCUTANEOUS

## 2019-09-17 NOTE — Progress Notes (Signed)
ANTICOAGULATION CONSULT NOTE -   Pharmacy Consult for heparin Indication: LV mural thrombus   No Known Allergies  Patient Measurements: Height: '5\' 9"'$  (175.3 cm) Weight: 201 lb 4.5 oz (91.3 kg) IBW/kg (Calculated) : 70.7 Heparin Dosing Weight: 94.7 kg  Vital Signs: Temp: 97.6 F (36.4 C) (03/20 0529) Temp Source: Oral (03/19 2056) BP: 102/61 (03/20 0529) Pulse Rate: 85 (03/20 0529)  Labs: Recent Labs    09/14/19 2247 09/15/19 0630 09/15/19 0630 09/16/19 0625 09/16/19 1515 09/17/19 0537  HGB  --  12.1*   < > 12.4*  --  11.8*  HCT  --  38.7*  --  38.4*  --  36.6*  PLT  --  195  --  185  --  170  APTT 100* 79*  --  183*  --   --   HEPARINUNFRC  --  0.68   < > 0.72* 0.59 0.46  CREATININE  --  0.90  --  0.82  --  0.85   < > = values in this interval not displayed.    Estimated Creatinine Clearance: 96.7 mL/min (by C-G formula based on SCr of 0.85 mg/dL).   Medical History: Past Medical History:  Diagnosis Date  . Anxiety   . CHF (congestive heart failure) (HCC)    a. EF 20% with global HK by echo in 08/2019. Also noted to have LV mural thrombus.   . Diabetes mellitus, type II ([place])   . Hyperlipidemia   . Thyroid disease     Medications:  Medications Prior to Admission  Medication Sig Dispense Refill Last Dose  . acetaminophen (TYLENOL) 325 MG tablet Take 2 tablets (650 mg total) by mouth every 4 (four) hours as needed for headache or mild pain. 12 tablet 0 09/12/2019 at Unknown time  . apixaban (ELIQUIS) 5 MG TABS tablet Take 1 tablet (5 mg total) by mouth 2 (two) times daily. 60 tablet 3 09/13/2019 at 1000  . blood glucose meter kit and supplies KIT Dispense based on patient and insurance preference. Use up to four times daily as directed. (FOR ICD-9 250.00, 250.01). 1 each 0   . blood glucose meter kit and supplies Dispense based on patient and insurance preference. Use up to four times daily as directed. (FOR ICD-10 E10.9, E11.9). 1 each 0   . digoxin (LANOXIN)  0.125 MG tablet Take 1 tablet (0.125 mg total) by mouth daily. 30 tablet 3 09/13/2019 at 1000  . furosemide (LASIX) 20 MG tablet Take 20 mg by mouth 2 (two) times daily.   09/13/2019 at Unknown time  . gabapentin (NEURONTIN) 100 MG capsule Take 1 capsule (100 mg total) by mouth 3 (three) times daily. 90 capsule 2 09/13/2019 at Unknown time  . glucose blood (GLUCOSE METER TEST) test strip Use as instructed 400 each 3   . insulin aspart protamine - aspart (NOVOLOG MIX 70/30 FLEXPEN) (70-30) 100 UNIT/ML FlexPen Inject 0.25 mLs (25 Units total) into the skin 2 (two) times daily with a meal. 15 mL 11 09/13/2019 at Unknown time  . Insulin Pen Needle 31G X 5 MM MISC 1 Units by Does not apply route 2 (two) times daily. 100 each 5   . levothyroxine (SYNTHROID) 75 MCG tablet Take 0.5 tablets (37.5 mcg total) by mouth daily before breakfast. 15 tablet 3 09/13/2019 at Unknown time  . lisinopril (ZESTRIL) 2.5 MG tablet Take 1 tablet (2.5 mg total) by mouth daily. 30 tablet 5 09/13/2019 at Unknown time  . OneTouch Delica Lancets 62Z MISC  Four times daily testing  dx e11.65 150 each 5   . rosuvastatin (CRESTOR) 5 MG tablet Take 1 tablet (5 mg total) by mouth every evening. 30 tablet 3 09/12/2019 at Unknown time  . spironolactone (ALDACTONE) 25 MG tablet Take 0.5 tablets (12.5 mg total) by mouth daily. 15 tablet 3 09/13/2019 at Unknown time  . tamsulosin (FLOMAX) 0.4 MG CAPS capsule Take 1 capsule (0.4 mg total) by mouth daily after supper. 30 capsule 3 09/12/2019 at Unknown time  . Vitamin D, Ergocalciferol, (DRISDOL) 1.25 MG (50000 UNIT) CAPS capsule Take 1 capsule (50,000 Units total) by mouth every 7 (seven) days. 5 capsule 2 09/13/2019 at Unknown time    Assessment: 72 yom [facility] office for acute on chronic CHF; also with a recent admit for new CM/HFrEF in Feb. He is up 30 lb within the past 2 weeks. He is on apixaban PTA for a large LV mural thrombus on ECHO 08/16/19. Pharmacy consulted to  begin IV heparin while apixaban on hold for [facility]. Marland Kitchen Renal function is normal. No bleeding noted, CBC stable. Marland Kitchen   anticipate [facility] on Monday with possible transfer to [facility] over the weekend pending bed status  HL 0.46- therapeutic   Correlation with Heparin level and aPTT- can monitor based on just heparin levels now.    Goal of Therapy:  Heparin level 0.3-0.7 Monitor platelets by anticoagulation protocol: Yes   Plan:  Continue heparin infusion at 1100 units/hr  Heparin level daily  Monitor CBC and s/sx of bleeding  Thank you for involving pharmacy in this patient's care.  Thomasenia Sales, PharmD, MBA, BCGP Clinical Pharmacist  09/17/2019 7:32 AM

## 2019-09-17 NOTE — Progress Notes (Addendum)
PROGRESS NOTE [facility]   Stanley Levy  HGD:[id number]  DOB: [birth year]  DOA: 09/13/2019 PCP: Freddy Finner, NP   Brief Admission Hx: 64 year old gentleman with low output systolic congestive heart failure EF 20%, hypertension, diabetes, poor compliance with medication was sent from cardiology office with progressive lower extremity edema and shortness of breath.  MDM/Assessment & Plan:   1. HFrEF -EF 20% from last echo 2/16.  The patient is diuresing extremely well with IV Lasix diuresis and with >4L output yesterday I have reduced lasix to 40mg  IV once daily.  The plan is for him to be diuresed and stabilized and then transferred to [facility] for heart catheterization.  Continue monitoring intake output and weights continue fluid restriction, follow cardiology recommendations.  Closely monitoring renal function.   2. Large LV thrombus and intracardiac thrombosis-patient remains fully anticoagulated with IV heparin as his apixaban is on hold. 3. Cerebrovascular disease status post history of CVA-he is on apixaban for full anticoagulation and Crestor. 4. Hypothyroidism-he is on home levothyroxine.  He recently had a markedly elevated TSH of 15.9. 5. Type 2 DM with severe hypoglycemia from insulin -patient had severe hypoglycemic reaction from 70/30 insulin. He has slowly been started on a basal bolus insulin program with supplemental sliding scale coverage and we are titrating to goal BS 140-180.   DVT prophylaxis: Heparin Code Status: DNR Family Communication: spoke with spouse by telephone   Disposition Plan: Continue inpatient management with plans to transfer to [facility] for cardiac catheterization when medically optimized   Consultants:  Cardiology  Procedures:    Antimicrobials:     Subjective: Patient reports marked improvement in LE edema.  No CP, No SOB today.   Objective: Vitals:   09/16/19 2056 09/17/19 0500 09/17/19 0529 09/17/19 0951  BP: 111/64   102/61   Pulse: 91  85 90  Resp: 20  20   Temp: 98 F (36.7 C)  97.6 F (36.4 C)   TempSrc: Oral     SpO2: 100%  95%   Weight:  91.3 kg    Height:        Intake/Output Summary (Last 24 hours) at 09/17/2019 1141 Last data filed at 09/17/2019 0300 Gross per 24 hour  Intake 404.23 ml  Output 3615 ml  Net -3210.77 ml   Filed Weights   09/16/19 0327 09/16/19 0900 09/17/19 0500  Weight: 96.2 kg 96 kg 91.3 kg   REVIEW OF SYSTEMS  As per history otherwise all reviewed and reported negative  Exam:  General exam: Chronically ill-appearing male awake and alert in no acute distress cooperative. Respiratory system: rare crackles at bases. No increased work of breathing. Cardiovascular system: S1 & S2 heard. No JVD, murmurs, gallops, clicks or pedal edema. Gastrointestinal system: Abdomen is nondistended, soft and nontender. Normal bowel sounds heard. Central nervous system: Alert and oriented. No focal neurological deficits. Extremities: 1+ pitting edema bilateral lower extremities MUCH IMPROVED   Data Reviewed: Basic Metabolic Panel: Recent Labs  Lab 09/13/19 1600 09/14/19 0650 09/15/19 0630 09/16/19 0625 09/17/19 0537  NA 135 139 137 138 139  K 3.6 3.3* 3.7 3.6 3.5  CL 101 105 101 100 98  CO2 25 27 27 29 31   GLUCOSE 297* 53* 224* 286* 91  BUN 20 18 18 17 16   CREATININE 0.90 0.76 0.90 0.82 0.85  CALCIUM 8.4* 8.6* 8.4* 8.4* 8.6*  MG  --   --   --  1.6* 1.8   Liver Function Tests: Recent  Labs  Lab 09/13/19 1600  AST 26  ALT 41  ALKPHOS 362*  BILITOT 0.8  PROT 6.3*  ALBUMIN 3.0*   No results for input(s): LIPASE, AMYLASE in the last 168 hours. No results for input(s): AMMONIA in the last 168 hours. CBC: Recent Labs  Lab 09/13/19 1600 09/14/19 0650 09/15/19 0630 09/16/19 0625 09/17/19 0537  WBC 5.5 6.1 5.2 5.6 6.1  NEUTROABS 3.7  --   --   --   --   HGB 11.8* 11.7* 12.1* 12.4* 11.8*  HCT 37.4* 37.0* 38.7* 38.4* 36.6*  MCV 101.1* 98.9 100.3* 98.0 98.4    PLT 215 201 195 185 170   Cardiac Enzymes: No results for input(s): CKTOTAL, CKMB, CKMBINDEX, TROPONINI in the last 168 hours. CBG (last 3)  Recent Labs    09/17/19 0304 09/17/19 0752 09/17/19 1120  GLUCAP 87 80 198*   Recent Results (from the past 240 hour(s))  SARS CORONAVIRUS 2 (TAT 6-24 HRS) Nasopharyngeal Nasopharyngeal Swab     Status: None   Collection Time: 09/13/19  5:02 PM   Specimen: Nasopharyngeal Swab  Result Value Ref Range Status   SARS Coronavirus 2 NEGATIVE NEGATIVE Final    Comment: (NOTE) SARS-CoV-2 target nucleic acids are NOT DETECTED. The SARS-CoV-2 RNA is generally detectable in upper and lower respiratory specimens during the acute phase of infection. Negative results do not preclude SARS-CoV-2 infection, do not rule out co-infections with other pathogens, and should not be used as the sole basis for treatment or other patient management decisions. Negative results must be combined with clinical observations, patient history, and epidemiological information. The expected result is Negative. Fact Sheet for Patients: SugarRoll.be Fact Sheet for Healthcare Providers: https://www.woods-mathews.com/ This test is not yet approved or cleared by the [place] FDA and  has been authorized for detection and/or diagnosis of SARS-CoV-2 by FDA under an Emergency Use Authorization (EUA). This EUA will remain  in effect (meaning this test can be used) for the duration of the COVID-19 declaration under Section 56 4(b)(1) of the Act, 21 U.S.C. section 360bbb-3(b)(1), unless the authorization is terminated or revoked sooner. Performed at [facility], [place] [address], [place],  [address]     Studies: No results found.   Scheduled Meds: . digoxin  0.125 mg Oral Daily  . [START ON 09/18/2019] furosemide  40 mg Intravenous Daily  . gabapentin  100 mg Oral TID  . insulin aspart  0-9 Units Subcutaneous TID WC   . insulin aspart  5 Units Subcutaneous TID WC  . insulin glargine  12 Units Subcutaneous Daily  . levothyroxine  37.5 mcg Oral Q0600  . lisinopril  5 mg Oral Daily  . potassium chloride  40 mEq Oral Daily  . rosuvastatin  5 mg Oral QPM  . spironolactone  12.5 mg Oral Daily  . tamsulosin  0.4 mg Oral QPC supper   Continuous Infusions: . heparin 1,100 Units/hr (09/16/19 2350)    Active Problems:   Uncontrolled type 2 diabetes mellitus with hyperglycemia (HCC)   Acute on chronic systolic (congestive) heart failure ([place])  Time spent:   Irwin Brakeman, MD [facility] 09/17/2019, 11:41 AM    LOS: 4 days  How to contact the [facility] Attending or Consulting provider [place] or covering provider during after hours [place], for this patient?  1. Check the care team in [facility] and look for a) attending/consulting TRH provider listed and b) the [facility] team listed 2. Log into [email or web address] and use  [place]'s universal password to access. If you do not have the password, please contact the hospital operator. 3. Locate the [facility] provider you are looking for under [facility] and page to a number that you can be directly reached. 4. If you still have difficulty reaching the provider, please page the [facility] (Director on Call) for the Hospitalists listed on amion for assistance.

## 2019-09-18 LAB — CBC
HCT: 38.7 % — ABNORMAL LOW (ref 39.0–52.0)
Hemoglobin: 12.2 g/dL — ABNORMAL LOW (ref 13.0–17.0)
MCH: 31 pg (ref 26.0–34.0)
MCHC: 31.5 g/dL (ref 30.0–36.0)
MCV: 98.2 fL (ref 80.0–100.0)
Platelets: 162 10*3/uL (ref 150–400)
RBC: 3.94 MIL/uL — ABNORMAL LOW (ref 4.22–5.81)
RDW: 15.1 % (ref 11.5–15.5)
WBC: 5.6 10*3/uL (ref 4.0–10.5)
nRBC: 0 % (ref 0.0–0.2)

## 2019-09-18 LAB — BASIC METABOLIC PANEL
Anion gap: 12 (ref 5–15)
BUN: 16 mg/dL (ref 8–23)
CO2: 27 mmol/L (ref 22–32)
Calcium: 8.5 mg/dL — ABNORMAL LOW (ref 8.9–10.3)
Chloride: 99 mmol/L (ref 98–111)
Creatinine, Ser: 0.75 mg/dL (ref 0.61–1.24)
GFR calc Af Amer: >60 mL/min (ref >60)
GFR calc non Af Amer: >60 mL/min (ref >60)
Glucose, Bld: 154 mg/dL — ABNORMAL HIGH (ref 70–99)
Potassium: 3.6 mmol/L (ref 3.5–5.1)
Sodium: 138 mmol/L (ref 135–145)

## 2019-09-18 LAB — MAGNESIUM: Magnesium: 1.8 mg/dL (ref 1.7–2.4)

## 2019-09-18 LAB — GLUCOSE, CAPILLARY
Glucose-Capillary: 143 mg/dL — ABNORMAL HIGH (ref 70–99)
Glucose-Capillary: 128 mg/dL — ABNORMAL HIGH (ref 70–99)
Glucose-Capillary: 229 mg/dL — ABNORMAL HIGH (ref 70–99)
Glucose-Capillary: 54 mg/dL — ABNORMAL LOW (ref 70–99)
Glucose-Capillary: 68 mg/dL — ABNORMAL LOW (ref 70–99)
Glucose-Capillary: 258 mg/dL — ABNORMAL HIGH (ref 70–99)

## 2019-09-18 LAB — HEPARIN LEVEL (UNFRACTIONATED): Heparin Unfractionated: 0.4 IU/mL (ref 0.30–0.70)

## 2019-09-18 MED ORDER — INSULIN ASPART 100 UNIT/ML ~~LOC~~ SOLN
3.0000 [IU] | Freq: Three times a day (TID) | SUBCUTANEOUS | Status: DC
  Administered 2019-09-19: 3 [IU] via SUBCUTANEOUS

## 2019-09-18 NOTE — Progress Notes (Signed)
ANTICOAGULATION CONSULT NOTE -   Pharmacy Consult for heparin Indication: LV mural thrombus   No Known Allergies  Patient Measurements: Height: 5' 9"  (175.3 cm) Weight: 199 lb 4.7 oz (90.4 kg) IBW/kg (Calculated) : 70.7 Heparin Dosing Weight: 94.7 kg  Vital Signs: Temp: 98.3 F (36.8 C) (03/21 0534) Temp Source: Oral (03/21 0534) BP: 103/78 (03/21 0551) Pulse Rate: 95 (03/21 0557)  Labs: Recent Labs    09/16/19 0625 09/16/19 0625 09/16/19 1515 09/17/19 0537 09/18/19 0457  HGB 12.4*   < >  --  11.8* 12.2*  HCT 38.4*  --   --  36.6* 38.7*  PLT 185  --   --  170 162  APTT 183*  --   --   --   --   HEPARINUNFRC 0.72*   < > 0.59 0.46 0.40  CREATININE 0.82  --   --  0.85 0.75   < > = values in this interval not displayed.    Estimated Creatinine Clearance: 102.3 mL/min (by C-G formula based on SCr of 0.75 mg/dL).   Medical History: Past Medical History:  Diagnosis Date  . Anxiety   . CHF (congestive heart failure) (HCC)    a. EF 20% with global HK by echo in 08/2019. Also noted to have LV mural thrombus.   . Diabetes mellitus, type II ([place])   . Hyperlipidemia   . Thyroid disease     Medications:  Medications Prior to Admission  Medication Sig Dispense Refill Last Dose  . acetaminophen (TYLENOL) 325 MG tablet Take 2 tablets (650 mg total) by mouth every 4 (four) hours as needed for headache or mild pain. 12 tablet 0 09/12/2019 at Unknown time  . apixaban (ELIQUIS) 5 MG TABS tablet Take 1 tablet (5 mg total) by mouth 2 (two) times daily. 60 tablet 3 09/13/2019 at 1000  . blood glucose meter kit and supplies KIT Dispense based on patient and insurance preference. Use up to four times daily as directed. (FOR ICD-9 250.00, 250.01). 1 each 0   . blood glucose meter kit and supplies Dispense based on patient and insurance preference. Use up to four times daily as directed. (FOR ICD-10 E10.9, E11.9). 1 each 0   . digoxin (LANOXIN) 0.125 MG tablet Take 1 tablet (0.125 mg  total) by mouth daily. 30 tablet 3 09/13/2019 at 1000  . furosemide (LASIX) 20 MG tablet Take 20 mg by mouth 2 (two) times daily.   09/13/2019 at Unknown time  . gabapentin (NEURONTIN) 100 MG capsule Take 1 capsule (100 mg total) by mouth 3 (three) times daily. 90 capsule 2 09/13/2019 at Unknown time  . glucose blood (GLUCOSE METER TEST) test strip Use as instructed 400 each 3   . insulin aspart protamine - aspart (NOVOLOG MIX 70/30 FLEXPEN) (70-30) 100 UNIT/ML FlexPen Inject 0.25 mLs (25 Units total) into the skin 2 (two) times daily with a meal. 15 mL 11 09/13/2019 at Unknown time  . Insulin Pen Needle 31G X 5 MM MISC 1 Units by Does not apply route 2 (two) times daily. 100 each 5   . levothyroxine (SYNTHROID) 75 MCG tablet Take 0.5 tablets (37.5 mcg total) by mouth daily before breakfast. 15 tablet 3 09/13/2019 at Unknown time  . lisinopril (ZESTRIL) 2.5 MG tablet Take 1 tablet (2.5 mg total) by mouth daily. 30 tablet 5 09/13/2019 at Unknown time  . OneTouch Delica Lancets 14N MISC Four times daily testing  dx e11.65 150 each 5   . rosuvastatin (CRESTOR) 5  MG tablet Take 1 tablet (5 mg total) by mouth every evening. 30 tablet 3 09/12/2019 at Unknown time  . spironolactone (ALDACTONE) 25 MG tablet Take 0.5 tablets (12.5 mg total) by mouth daily. 15 tablet 3 09/13/2019 at Unknown time  . tamsulosin (FLOMAX) 0.4 MG CAPS capsule Take 1 capsule (0.4 mg total) by mouth daily after supper. 30 capsule 3 09/12/2019 at Unknown time  . Vitamin D, Ergocalciferol, (DRISDOL) 1.25 MG (50000 UNIT) CAPS capsule Take 1 capsule (50,000 Units total) by mouth every 7 (seven) days. 5 capsule 2 09/13/2019 at Unknown time    Assessment: 13 yom [facility] office for acute on chronic CHF; also with a recent admit for new CM/HFrEF in Feb. He is up 30 lb within the past 2 weeks. He is on apixaban PTA for a large LV mural thrombus on ECHO 08/16/19. Pharmacy consulted to begin IV heparin while apixaban on hold for  [facility]. Marland Kitchen Renal function is normal. No bleeding noted, CBC stable. Marland Kitchen   anticipate [facility] on Monday with possible transfer to [facility] over the weekend pending bed status  HL 0.40- therapeutic   Correlation with Heparin level and aPTT- can monitor based on just heparin levels now.    Goal of Therapy:  Heparin level 0.3-0.7 Monitor platelets by anticoagulation protocol: Yes   Plan:  Continue heparin infusion at 1100 units/hr  Heparin level daily  Monitor CBC and s/sx of bleeding  Thank you for involving pharmacy in this patient's care.  Thomasenia Sales, PharmD, MBA, BCGP Clinical Pharmacist  09/18/2019 7:57 AM

## 2019-09-18 NOTE — Progress Notes (Signed)
PROGRESS NOTE [facility]   Stanley VANDEVOORDE  JOA:[id number]  DOB: [birth year]  DOA: 09/13/2019 PCP: Freddy Finner, NP   Brief Admission Hx: 64 year old gentleman with low output systolic congestive heart failure EF 20%, hypertension, diabetes, poor compliance with medication was sent from cardiology office with progressive lower extremity edema and shortness of breath.  MDM/Assessment & Plan:   1. HFrEF -EF 20% from last echo 2/16.  The patient is diuresing extremely well with IV Lasix diuresis and with >4L output 3/19 I reduced lasix to 40mg  IV once daily.  He has had 2L output in last 24 hours which is the goal.  The plan is for him to be diuresed and stabilized and then transferred to [facility] for heart catheterization.  Continue monitoring intake output and weights continue fluid restriction, follow cardiology recommendations.  Closely monitoring renal function.   2. Large LV thrombus and intracardiac thrombosis-patient remains fully anticoagulated with IV heparin as his apixaban is on hold. 3. Cerebrovascular disease status post history of CVA-he is on apixaban for full anticoagulation and Crestor. 4. Hypothyroidism-he is on home levothyroxine.  He recently had a markedly elevated TSH of 15.9. 5. Type 2 DM with severe hypoglycemia from insulin -patient had severe hypoglycemic reaction from 70/30 insulin. He has slowly been started on a basal bolus insulin program with supplemental sliding scale coverage and we are titrating to goal BS 140-180.   DVT prophylaxis: Heparin Code Status: DNR Family Communication: spoke with spouse by telephone   Disposition Plan: Continue diuresis with IV lasix with plans to transfer to [facility] for cardiac catheterization 3/22  Consultants:  Cardiology  Procedures:    Antimicrobials:     Subjective: Patient denies any complaints today.   Objective: Vitals:   09/18/19 0551 09/18/19 0556 09/18/19 0557 09/18/19 0836  BP: 103/78      Pulse: (!) 101 95 95 94  Resp:   18   Temp:      TempSrc:      SpO2:  99% 96%   Weight:      Height:        Intake/Output Summary (Last 24 hours) at 09/18/2019 0953 Last data filed at 09/18/2019 09/20/2019 Gross per 24 hour  Intake 720 ml  Output 2800 ml  Net -2080 ml   Filed Weights   09/16/19 0900 09/17/19 0500 09/18/19 0500  Weight: 96 kg 91.3 kg 90.4 kg   REVIEW OF SYSTEMS  As per history otherwise all reviewed and reported negative  Exam:  General exam: Chronically ill-appearing male awake and alert in no acute distress cooperative. Respiratory system: clear to ausculation. No increased work of breathing. Cardiovascular system: S1 & S2 heard. No JVD, murmurs, gallops, clicks or pedal edema. Gastrointestinal system: Abdomen is nondistended, soft and nontender. Normal bowel sounds heard. Central nervous system: Alert and oriented. No focal neurological deficits. Extremities: trace edema bilateral lower extremities MUCH IMPROVED   Data Reviewed: Basic Metabolic Panel: Recent Labs  Lab 09/14/19 0650 09/15/19 0630 09/16/19 0625 09/17/19 0537 09/18/19 0457  NA 139 137 138 139 138  K 3.3* 3.7 3.6 3.5 3.6  CL 105 101 100 98 99  CO2 27 27 29 31 27   GLUCOSE 53* 224* 286* 91 154*  BUN 18 18 17 16 16   CREATININE 0.76 0.90 0.82 0.85 0.75  CALCIUM 8.6* 8.4* 8.4* 8.6* 8.5*  MG  --   --  1.6* 1.8 1.8   Liver Function Tests: Recent Labs  Lab 09/13/19 1600  AST 26  ALT 41  ALKPHOS 362*  BILITOT 0.8  PROT 6.3*  ALBUMIN 3.0*   No results for input(s): LIPASE, AMYLASE in the last 168 hours. No results for input(s): AMMONIA in the last 168 hours. CBC: Recent Labs  Lab 09/13/19 1600 09/13/19 1600 09/14/19 0650 09/15/19 0630 09/16/19 0625 09/17/19 0537 09/18/19 0457  WBC 5.5   < > 6.1 5.2 5.6 6.1 5.6  NEUTROABS 3.7  --   --   --   --   --   --   HGB 11.8*   < > 11.7* 12.1* 12.4* 11.8* 12.2*  HCT 37.4*   < > 37.0* 38.7* 38.4* 36.6* 38.7*  MCV 101.1*   < > 98.9  100.3* 98.0 98.4 98.2  PLT 215   < > 201 195 185 170 162   < > = values in this interval not displayed.   Cardiac Enzymes: No results for input(s): CKTOTAL, CKMB, CKMBINDEX, TROPONINI in the last 168 hours. CBG (last 3)  Recent Labs    09/17/19 2106 09/18/19 0313 09/18/19 0816  GLUCAP 193* 143* 128*   Recent Results (from the past 240 hour(s))  SARS CORONAVIRUS 2 (TAT 6-24 HRS) Nasopharyngeal Nasopharyngeal Swab     Status: None   Collection Time: 09/13/19  5:02 PM   Specimen: Nasopharyngeal Swab  Result Value Ref Range Status   SARS Coronavirus 2 NEGATIVE NEGATIVE Final    Comment: (NOTE) SARS-CoV-2 target nucleic acids are NOT DETECTED. The SARS-CoV-2 RNA is generally detectable in upper and lower respiratory specimens during the acute phase of infection. Negative results do not preclude SARS-CoV-2 infection, do not rule out co-infections with other pathogens, and should not be used as the sole basis for treatment or other patient management decisions. Negative results must be combined with clinical observations, patient history, and epidemiological information. The expected result is Negative. Fact Sheet for Patients: SugarRoll.be Fact Sheet for Healthcare Providers: https://www.woods-mathews.com/ This test is not yet approved or cleared by the [place] FDA and  has been authorized for detection and/or diagnosis of SARS-CoV-2 by FDA under an Emergency Use Authorization (EUA). This EUA will remain  in effect (meaning this test can be used) for the duration of the COVID-19 declaration under Section 56 4(b)(1) of the Act, 21 U.S.C. section 360bbb-3(b)(1), unless the authorization is terminated or revoked sooner. Performed at [facility], [place] [address], [place], [place] [address]     Studies: No results found.   Scheduled Meds: . digoxin  0.125 mg Oral Daily  . furosemide  40 mg Intravenous Daily  . gabapentin   100 mg Oral TID  . insulin aspart  0-9 Units Subcutaneous TID WC  . insulin aspart  5 Units Subcutaneous TID WC  . insulin glargine  12 Units Subcutaneous Daily  . levothyroxine  37.5 mcg Oral Q0600  . lisinopril  5 mg Oral Daily  . potassium chloride  40 mEq Oral Daily  . rosuvastatin  5 mg Oral QPM  . spironolactone  12.5 mg Oral Daily  . tamsulosin  0.4 mg Oral QPC supper   Continuous Infusions: . heparin 1,100 Units/hr (09/18/19 0004)    Active Problems:   Uncontrolled type 2 diabetes mellitus with hyperglycemia (HCC)   Acute on chronic systolic (congestive) heart failure ([place])  Time spent:   Irwin Brakeman, MD [facility] 09/18/2019, 9:53 AM    LOS: 5 days  How to contact the [facility] Attending or Consulting provider [place] or covering provider  during after hours 7P -7A, for this patient?  1. Check the care team in [facility] and look for a) attending/consulting [facility] provider listed and b) the [facility] team listed 2. Log into [email or web address] and use [place]'s universal password to access. If you do not have the password, please contact the hospital operator. 3. Locate the [facility] provider you are looking for under [facility] and page to a number that you can be directly reached. 4. If you still have difficulty reaching the provider, please page the [facility] (Director on Call) for the Hospitalists listed on amion for assistance.

## 2019-09-18 NOTE — Progress Notes (Signed)
Hypoglycemic Event  CBG: 54   Treatment: 8 oz juice/soda  Symptoms: None  Comments/MD notified:Dr. Laural Benes

## 2019-09-19 LAB — CBC
HCT: 40.2 % (ref 39.0–52.0)
Hemoglobin: 12.4 g/dL — ABNORMAL LOW (ref 13.0–17.0)
MCH: 30.6 pg (ref 26.0–34.0)
MCHC: 30.8 g/dL (ref 30.0–36.0)
MCV: 99.3 fL (ref 80.0–100.0)
Platelets: 151 10*3/uL (ref 150–400)
RBC: 4.05 MIL/uL — ABNORMAL LOW (ref 4.22–5.81)
RDW: 15 % (ref 11.5–15.5)
WBC: 5.7 10*3/uL (ref 4.0–10.5)
nRBC: 0 % (ref 0.0–0.2)

## 2019-09-19 LAB — BASIC METABOLIC PANEL
Anion gap: 11 (ref 5–15)
BUN: 16 mg/dL (ref 8–23)
CO2: 28 mmol/L (ref 22–32)
Calcium: 8.6 mg/dL — ABNORMAL LOW (ref 8.9–10.3)
Chloride: 98 mmol/L (ref 98–111)
Creatinine, Ser: 0.81 mg/dL (ref 0.61–1.24)
GFR calc Af Amer: >60 mL/min (ref >60)
GFR calc non Af Amer: >60 mL/min (ref >60)
Glucose, Bld: 185 mg/dL — ABNORMAL HIGH (ref 70–99)
Potassium: 4.1 mmol/L (ref 3.5–5.1)
Sodium: 137 mmol/L (ref 135–145)

## 2019-09-19 LAB — HEPARIN LEVEL (UNFRACTIONATED): Heparin Unfractionated: 0.35 IU/mL (ref 0.30–0.70)

## 2019-09-19 LAB — MAGNESIUM: Magnesium: 1.9 mg/dL (ref 1.7–2.4)

## 2019-09-19 LAB — GLUCOSE, CAPILLARY
Glucose-Capillary: 204 mg/dL — ABNORMAL HIGH (ref 70–99)
Glucose-Capillary: 197 mg/dL — ABNORMAL HIGH (ref 70–99)
Glucose-Capillary: 239 mg/dL — ABNORMAL HIGH (ref 70–99)
Glucose-Capillary: 168 mg/dL — ABNORMAL HIGH (ref 70–99)
Glucose-Capillary: 223 mg/dL — ABNORMAL HIGH (ref 70–99)

## 2019-09-19 MED ORDER — INSULIN ASPART 100 UNIT/ML ~~LOC~~ SOLN
2.0000 [IU] | Freq: Three times a day (TID) | SUBCUTANEOUS | Status: DC
  Administered 2019-09-19 – 2019-09-22 (×9): 2 [IU] via SUBCUTANEOUS

## 2019-09-19 MED ORDER — SPIRONOLACTONE 12.5 MG HALF TABLET
12.5000 mg | ORAL_TABLET | Freq: Every day | ORAL | Status: DC
  Administered 2019-09-20 – 2019-09-22 (×3): 12.5 mg via ORAL
  Filled 2019-09-19 (×6): qty 0.5
  Filled 2019-09-19: qty 1

## 2019-09-19 MED ORDER — SODIUM CHLORIDE 0.9% FLUSH
3.0000 mL | Freq: Two times a day (BID) | INTRAVENOUS | Status: DC
  Administered 2019-09-19: 3 mL via INTRAVENOUS

## 2019-09-19 MED ORDER — SODIUM CHLORIDE 0.9 % IV SOLN: 250.0000 mL | INTRAVENOUS | Status: DC | PRN

## 2019-09-19 MED ORDER — SODIUM CHLORIDE 0.9 % IV SOLN: INTRAVENOUS | Status: DC

## 2019-09-19 MED ORDER — SODIUM CHLORIDE 0.9% FLUSH: 3.0000 mL | INTRAVENOUS | Status: DC | PRN

## 2019-09-19 MED ORDER — ASPIRIN 81 MG PO CHEW
81.0000 mg | CHEWABLE_TABLET | ORAL | Status: AC
  Administered 2019-09-20: 05:00:00 81 mg via ORAL
  Filled 2019-09-19: qty 1

## 2019-09-19 NOTE — H&P (View-Only) (Signed)
Progress Note  Patient Name: Stanley Levy Date of Encounter: 09/19/2019  Primary Cardiologist: Kate Sable, MD   Subjective   No complaints  Inpatient Medications    Scheduled Meds: . digoxin  0.125 mg Oral Daily  . furosemide  40 mg Intravenous Daily  . gabapentin  100 mg Oral TID  . insulin aspart  0-9 Units Subcutaneous TID WC  . insulin aspart  3 Units Subcutaneous TID WC  . insulin glargine  12 Units Subcutaneous Daily  . levothyroxine  37.5 mcg Oral Q0600  . lisinopril  5 mg Oral Daily  . potassium chloride  40 mEq Oral Daily  . rosuvastatin  5 mg Oral QPM  . spironolactone  12.5 mg Oral Daily  . tamsulosin  0.4 mg Oral QPC supper   Continuous Infusions: . heparin 1,100 Units/hr (09/19/19 0133)   PRN Meds: acetaminophen **OR** acetaminophen, ondansetron **OR** ondansetron (ZOFRAN) IV, polyethylene glycol   Vital Signs    Vitals:   09/18/19 1950 09/18/19 2139 09/19/19 0512 09/19/19 0805  BP:  117/78 118/78   Pulse:  (!) 101 93   Resp:  20    Temp:  97.7 F (36.5 C) 98.1 F (36.7 C)   TempSrc:  Oral Oral   SpO2: 100% 99% 100% 99%  Weight:   88.6 kg   Height:        Intake/Output Summary (Last 24 hours) at 09/19/2019 0934 Last data filed at 09/19/2019 0900 Gross per 24 hour  Intake 1020 ml  Output 3400 ml  Net -2380 ml   Last 3 Weights 09/19/2019 09/18/2019 09/17/2019  Weight (lbs) 195 lb 5.2 oz 199 lb 4.7 oz 201 lb 4.5 oz  Weight (kg) 88.6 kg 90.4 kg 91.3 kg  Some encounter information is confidential and restricted. Go to Review Flowsheets activity to see all data.      Telemetry    SR - Personally Reviewed  ECG    n/a - Personally Reviewed  Physical Exam   GEN: No acute distress.   Neck: No JVD Cardiac: RRR, no murmurs, rubs, or gallops.  Respiratory: faint crackles bases GI: Soft, nontender, non-distended  MS: No edema; No deformity. Neuro:  Nonfocal  Psych: Normal affect   Labs    High Sensitivity Troponin:   Recent  Labs  Lab 08/24/19 1709 08/24/19 2050 09/13/19 1600 09/13/19 1811  TROPONINIHS 16 21* 20* 25*      Chemistry Recent Labs  Lab 09/13/19 1600 09/14/19 0650 09/16/19 0625 09/17/19 0537 09/18/19 0457  NA 135   < > 138 139 138  K 3.6   < > 3.6 3.5 3.6  CL 101   < > 100 98 99  CO2 25   < > 29 31 27   GLUCOSE 297*   < > 286* 91 154*  BUN 20   < > 17 16 16   CREATININE 0.90   < > 0.82 0.85 0.75  CALCIUM 8.4*   < > 8.4* 8.6* 8.5*  PROT 6.3*  --   --   --   --   ALBUMIN 3.0*  --   --   --   --   AST 26  --   --   --   --   ALT 41  --   --   --   --   ALKPHOS 362*  --   --   --   --   BILITOT 0.8  --   --   --   --  GFRNONAA >60   < > >60 >60 >60  GFRAA >60   < > >60 >60 >60  ANIONGAP 9   < > 9 10 12    < > = values in this interval not displayed.     Hematology Recent Labs  Lab 09/17/19 0537 09/18/19 0457 09/19/19 0456  WBC 6.1 5.6 5.7  RBC 3.72* 3.94* 4.05*  HGB 11.8* 12.2* 12.4*  HCT 36.6* 38.7* 40.2  MCV 98.4 98.2 99.3  MCH 31.7 31.0 30.6  MCHC 32.2 31.5 30.8  RDW 15.3 15.1 15.0  PLT 170 162 151    BNP Recent Labs  Lab 09/13/19 1600  BNP 1,082.0*     DDimer No results for input(s): DDIMER in the last 168 hours.   Radiology    No results found.  Cardiac Studies     Patient Profile     64 y.o. male w/PMH of chronic systolic CHF (EF 76 by echo in 08/2019), LV Mural Thrombus (on Eliquis PTA), HTN, HLD, Uncontrolled IDDM, Hypothyroidism and history of CVA who was sent directly to the ED from the office on 09/13/2019 for 30 lb weight gain within the past 2 weeks and significant volume overload.  Assessment & Plan    1. Acute on chronic systolic HF - fairly new diagnosis by echo 08/2019, LVEF 20%. He declined cath at that time. - Admitted with severe volume overload, up 28 lbs within 2 weeks - negative 2.9 L yesterday, neg 21L since admission. He is on lasix 40mg  IV daily. Overall owntrend in Cr with diuresis consistent with venous congestion and CHF,  labs pending today - plan for RHC/LHC once euvolemic - continue IV diuresis today  - medical therapy with digoxin 0.125, lisinopril 5, aldactone 12.5. Had not been started on beta blocker at prior admission due to concern about possible low ouptut. Hold on starting due to current decompensation, f/u CO/CI from upcoming RHC prior to starting.   - volume status much improved, at the point where we can pursue cath.Will plan transfer to [place] [facility] [place] to telemetry bed cardiology service, needs RHC/LHC tomorrow.    2. LV thrombus - echo in 08/2019 showed an LV thrombus measuring2.0 x 2.5 cm. On Eliquis for treatment given concern for noncompliance with Coumadin. Eliquis held and being bridged with Heparin in anticipation of cath.  - currently on hep gtt with plans for cath this admission  3. Uncontrolled IDDM - Hgb A1c >18.5 in 08/2019.   4. Uncontrolled Hypothyroidism - TSH elevated to 15.913in 08/2019. Has been continued on PTA Synthroid.  For questions or updates, please contact [facility] Please consult [email or web address] for contact info under        Signed, 08/2019, MD  09/19/2019, 9:34 AM

## 2019-09-19 NOTE — Progress Notes (Signed)
ANTICOAGULATION CONSULT NOTE -   Pharmacy Consult for heparin Indication: LV mural thrombus   No Known Allergies  Patient Measurements: Height: 5' 9"  (175.3 cm) Weight: 195 lb 5.2 oz (88.6 kg) IBW/kg (Calculated) : 70.7 Heparin Dosing Weight: 94.7 kg  Vital Signs: Temp: 98.1 F (36.7 C) (03/22 0512) Temp Source: Oral (03/22 0512) BP: 118/78 (03/22 0512) Pulse Rate: 93 (03/22 0512)  Labs: Recent Labs    09/17/19 0537 09/17/19 0537 09/18/19 0457 09/19/19 0456  HGB 11.8*   < > 12.2* 12.4*  HCT 36.6*  --  38.7* 40.2  PLT 170  --  162 151  HEPARINUNFRC 0.46  --  0.40 0.35  CREATININE 0.85  --  0.75  --    < > = values in this interval not displayed.    Estimated Creatinine Clearance: 101.4 mL/min (by C-G formula based on SCr of 0.75 mg/dL).   Medical History: Past Medical History:  Diagnosis Date  . Anxiety   . CHF (congestive heart failure) (HCC)    a. EF 20% with global HK by echo in 08/2019. Also noted to have LV mural thrombus.   . Diabetes mellitus, type II ([place])   . Hyperlipidemia   . Thyroid disease     Medications:  Medications Prior to Admission  Medication Sig Dispense Refill Last Dose  . acetaminophen (TYLENOL) 325 MG tablet Take 2 tablets (650 mg total) by mouth every 4 (four) hours as needed for headache or mild pain. 12 tablet 0 09/12/2019 at Unknown time  . apixaban (ELIQUIS) 5 MG TABS tablet Take 1 tablet (5 mg total) by mouth 2 (two) times daily. 60 tablet 3 09/13/2019 at 1000  . blood glucose meter kit and supplies KIT Dispense based on patient and insurance preference. Use up to four times daily as directed. (FOR ICD-9 250.00, 250.01). 1 each 0   . blood glucose meter kit and supplies Dispense based on patient and insurance preference. Use up to four times daily as directed. (FOR ICD-10 E10.9, E11.9). 1 each 0   . digoxin (LANOXIN) 0.125 MG tablet Take 1 tablet (0.125 mg total) by mouth daily. 30 tablet 3 09/13/2019 at 1000  . furosemide (LASIX) 20  MG tablet Take 20 mg by mouth 2 (two) times daily.   09/13/2019 at Unknown time  . gabapentin (NEURONTIN) 100 MG capsule Take 1 capsule (100 mg total) by mouth 3 (three) times daily. 90 capsule 2 09/13/2019 at Unknown time  . glucose blood (GLUCOSE METER TEST) test strip Use as instructed 400 each 3   . insulin aspart protamine - aspart (NOVOLOG MIX 70/30 FLEXPEN) (70-30) 100 UNIT/ML FlexPen Inject 0.25 mLs (25 Units total) into the skin 2 (two) times daily with a meal. 15 mL 11 09/13/2019 at Unknown time  . Insulin Pen Needle 31G X 5 MM MISC 1 Units by Does not apply route 2 (two) times daily. 100 each 5   . levothyroxine (SYNTHROID) 75 MCG tablet Take 0.5 tablets (37.5 mcg total) by mouth daily before breakfast. 15 tablet 3 09/13/2019 at Unknown time  . lisinopril (ZESTRIL) 2.5 MG tablet Take 1 tablet (2.5 mg total) by mouth daily. 30 tablet 5 09/13/2019 at Unknown time  . OneTouch Delica Lancets 07E MISC Four times daily testing  dx e11.65 150 each 5   . rosuvastatin (CRESTOR) 5 MG tablet Take 1 tablet (5 mg total) by mouth every evening. 30 tablet 3 09/12/2019 at Unknown time  . spironolactone (ALDACTONE) 25 MG tablet Take 0.5 tablets (  12.5 mg total) by mouth daily. 15 tablet 3 09/13/2019 at Unknown time  . tamsulosin (FLOMAX) 0.4 MG CAPS capsule Take 1 capsule (0.4 mg total) by mouth daily after supper. 30 capsule 3 09/12/2019 at Unknown time  . Vitamin D, Ergocalciferol, (DRISDOL) 1.25 MG (50000 UNIT) CAPS capsule Take 1 capsule (50,000 Units total) by mouth every 7 (seven) days. 5 capsule 2 09/13/2019 at Unknown time    Assessment: 66 yom sent to the [facility] from Cardiology office for acute on chronic CHF; also with a recent admit for new CM/HFrEF in Feb. He is up 30 lb within the past 2 weeks. He is on apixaban PTA for a large LV mural thrombus on ECHO 08/16/19. Pharmacy consulted to begin IV heparin while apixaban on hold for [facility]. Marland Kitchen Renal function is normal. No bleeding noted, CBC stable.    anticipate [facility] on Monday with possible transfer to [facility] over the weekend pending bed status  HL 0.35- therapeutic   Goal of Therapy:  Heparin level 0.3-0.7 Monitor platelets by anticoagulation protocol: Yes   Plan:  Continue heparin infusion at 1100 units/hr  Heparin level daily  Monitor CBC and s/sx of bleeding  Thank you for involving pharmacy in this patient's care.  Margot Ables, PharmD Clinical Pharmacist 09/19/2019 7:38 AM

## 2019-09-19 NOTE — Progress Notes (Addendum)
 Progress Note  Patient Name: Stanley Levy Date of Encounter: 09/19/2019  Primary Cardiologist: Suresh Koneswaran, MD   Subjective   No complaints  Inpatient Medications    Scheduled Meds: . digoxin  0.125 mg Oral Daily  . furosemide  40 mg Intravenous Daily  . gabapentin  100 mg Oral TID  . insulin aspart  0-9 Units Subcutaneous TID WC  . insulin aspart  3 Units Subcutaneous TID WC  . insulin glargine  12 Units Subcutaneous Daily  . levothyroxine  37.5 mcg Oral Q0600  . lisinopril  5 mg Oral Daily  . potassium chloride  40 mEq Oral Daily  . rosuvastatin  5 mg Oral QPM  . spironolactone  12.5 mg Oral Daily  . tamsulosin  0.4 mg Oral QPC supper   Continuous Infusions: . heparin 1,100 Units/hr (09/19/19 0133)   PRN Meds: acetaminophen **OR** acetaminophen, ondansetron **OR** ondansetron (ZOFRAN) IV, polyethylene glycol   Vital Signs    Vitals:   09/18/19 1950 09/18/19 2139 09/19/19 0512 09/19/19 0805  BP:  117/78 118/78   Pulse:  (!) 101 93   Resp:  20    Temp:  97.7 F (36.5 C) 98.1 F (36.7 C)   TempSrc:  Oral Oral   SpO2: 100% 99% 100% 99%  Weight:   88.6 kg   Height:        Intake/Output Summary (Last 24 hours) at 09/19/2019 0934 Last data filed at 09/19/2019 0900 Gross per 24 hour  Intake 1020 ml  Output 3400 ml  Net -2380 ml   Last 3 Weights 09/19/2019 09/18/2019 09/17/2019  Weight (lbs) 195 lb 5.2 oz 199 lb 4.7 oz 201 lb 4.5 oz  Weight (kg) 88.6 kg 90.4 kg 91.3 kg  Some encounter information is confidential and restricted. Go to Review Flowsheets activity to see all data.      Telemetry    SR - Personally Reviewed  ECG    n/a - Personally Reviewed  Physical Exam   GEN: No acute distress.   Neck: No JVD Cardiac: RRR, no murmurs, rubs, or gallops.  Respiratory: faint crackles bases GI: Soft, nontender, non-distended  MS: No edema; No deformity. Neuro:  Nonfocal  Psych: Normal affect   Labs    High Sensitivity Troponin:   Recent  Labs  Lab 08/24/19 1709 08/24/19 2050 09/13/19 1600 09/13/19 1811  TROPONINIHS 16 21* 20* 25*      Chemistry Recent Labs  Lab 09/13/19 1600 09/14/19 0650 09/16/19 0625 09/17/19 0537 09/18/19 0457  NA 135   < > 138 139 138  K 3.6   < > 3.6 3.5 3.6  CL 101   < > 100 98 99  CO2 25   < > 29 31 27  GLUCOSE 297*   < > 286* 91 154*  BUN 20   < > 17 16 16  CREATININE 0.90   < > 0.82 0.85 0.75  CALCIUM 8.4*   < > 8.4* 8.6* 8.5*  PROT 6.3*  --   --   --   --   ALBUMIN 3.0*  --   --   --   --   AST 26  --   --   --   --   ALT 41  --   --   --   --   ALKPHOS 362*  --   --   --   --   BILITOT 0.8  --   --   --   --     GFRNONAA >60   < > >60 >60 >60  GFRAA >60   < > >60 >60 >60  ANIONGAP 9   < > 9 10 12    < > = values in this interval not displayed.     Hematology Recent Labs  Lab 09/17/19 0537 09/18/19 0457 09/19/19 0456  WBC 6.1 5.6 5.7  RBC 3.72* 3.94* 4.05*  HGB 11.8* 12.2* 12.4*  HCT 36.6* 38.7* 40.2  MCV 98.4 98.2 99.3  MCH 31.7 31.0 30.6  MCHC 32.2 31.5 30.8  RDW 15.3 15.1 15.0  PLT 170 162 151    BNP Recent Labs  Lab 09/13/19 1600  BNP 1,082.0*     DDimer No results for input(s): DDIMER in the last 168 hours.   Radiology    No results found.  Cardiac Studies     Patient Profile     64 y.o. male w/PMH of chronic systolic CHF (EF 76 by echo in 08/2019), LV Mural Thrombus (on Eliquis PTA), HTN, HLD, Uncontrolled IDDM, Hypothyroidism and history of CVA who was sent directly to the ED from the office on 09/13/2019 for 30 lb weight gain within the past 2 weeks and significant volume overload.  Assessment & Plan    1. Acute on chronic systolic HF - fairly new diagnosis by echo 08/2019, LVEF 20%. He declined cath at that time. - Admitted with severe volume overload, up 28 lbs within 2 weeks - negative 2.9 L yesterday, neg 21L since admission. He is on lasix 40mg  IV daily. Overall owntrend in Cr with diuresis consistent with venous congestion and CHF,  labs pending today - plan for RHC/LHC once euvolemic - continue IV diuresis today  - medical therapy with digoxin 0.125, lisinopril 5, aldactone 12.5. Had not been started on beta blocker at prior admission due to concern about possible low ouptut. Hold on starting due to current decompensation, f/u CO/CI from upcoming RHC prior to starting.   - volume status much improved, at the point where we can pursue cath.Will plan transfer to [place] [facility] [place] to telemetry bed cardiology service, needs RHC/LHC tomorrow.    2. LV thrombus - echo in 08/2019 showed an LV thrombus measuring2.0 x 2.5 cm. On Eliquis for treatment given concern for noncompliance with Coumadin. Eliquis held and being bridged with Heparin in anticipation of cath.  - currently on hep gtt with plans for cath this admission  3. Uncontrolled IDDM - Hgb A1c >18.5 in 08/2019.   4. Uncontrolled Hypothyroidism - TSH elevated to 15.913in 08/2019. Has been continued on PTA Synthroid.  For questions or updates, please contact [facility] Please consult [email or web address] for contact info under        Signed, 08/2019, MD  09/19/2019, 9:34 AM

## 2019-09-19 NOTE — Care Management Important Message (Signed)
Important Message  Patient Details  Name: Stanley Levy MRN: [id number] Date of Birth: 1956-03-15   Medicare Important Message Given:  Yes     Corey Harold 09/19/2019, 1:59 PM

## 2019-09-19 NOTE — Progress Notes (Signed)
PROGRESS NOTE [facility]   Stanley Levy  [name]  DOB: [birth year]  DOA: 09/13/2019 PCP: Freddy Finner, NP   Brief Admission Hx: 64 year old gentleman with low output systolic congestive heart failure EF 20%, hypertension, diabetes, poor compliance with medication was sent from cardiology office with progressive lower extremity edema and shortness of breath.  MDM/Assessment & Plan:   1. HFrEF -EF 20% from last echo 2/16.  Will continue IV diuresis. He has had 3L output in last 24 hours and is stable from orthopnea/volume stand point to pursuit heart cath evaluation.as planned will transfer to [facility] for heart catheterization.  Continue monitoring intake output and weights, continue fluid restriction, follow cardiology recommendations.  Closely monitoring renal function.   2. Large LV thrombus and intracardiac thrombosis-patient remains fully anticoagulated with IV heparin as his apixaban is on hold. 3. Cerebrovascular disease: he is on apixaban for full anticoagulation as an outpatient and Crestor. No deficits or complaints reported. Currently on full anticoagulation using heparin drip.  4. Hypothyroidism-he is on home levothyroxine.  He recently had a markedly elevated TSH of 15.9. continue current dose of synthroid; follow thyroid panel and adjust regimen as needed.  5. Type 2 DM with severe hypoglycemia from insulin -patient had severe hypoglycemic reaction from 70/30 insulin. He has slowly been started on a basal bolus insulin program with supplemental sliding scale coverage and we are titrating to goal BS 140-180. Most recent A1C 18.5.    DVT prophylaxis: Heparin Code Status: DNR Family Communication: no family at bedside Disposition Plan: Continue diuresis with IV lasix with plans to transfer to [facility] for cardiac catheterization 3/23; after discussing with cardiology, will transfer to cardiology service.   Consultants:  Cardiology  Procedures:  See below  for x-ray reports  Antimicrobials:  None  Subjective: No fever, no nausea, no vomiting.  Express shortness of breath on exertion overall feeling better.  No chest pain.  Objective: Vitals:   09/19/19 0512 09/19/19 0805 09/19/19 1305 09/19/19 1338  BP: 118/78  114/68   Pulse: 93  89   Resp:   18   Temp: 98.1 F (36.7 C)  97.8 F (36.6 C)   TempSrc: Oral  Oral   SpO2: 100% 99% (!) 87% 92%  Weight: 88.6 kg     Height:        Intake/Output Summary (Last 24 hours) at 09/19/2019 1346 Last data filed at 09/19/2019 1300 Gross per 24 hour  Intake 780 ml  Output 4400 ml  Net -3620 ml   Filed Weights   09/17/19 0500 09/18/19 0500 09/19/19 0512  Weight: 91.3 kg 90.4 kg 88.6 kg   REVIEW OF SYSTEMS  As per history otherwise all reviewed and reported negative  Exam: General exam: Alert, awake, oriented x 3; chronically ill in appearance; in no acute distress currently.  Cooperative with examination.  Denies chest pain. Respiratory system: Respiratory effort normal.  Overall expressed improvement in his breathing. Cardiovascular system:RRR. No murmurs, rubs, gallops.  No frank JVD appreciated on exam. Gastrointestinal system: Abdomen is nondistended, soft and nontender. No organomegaly or masses felt. Normal bowel sounds heard. Central nervous system: Alert and oriented. No focal neurological deficits. Extremities: No C/C/E, +pedal pulses Skin: No rashes, lesions or ulcers Psychiatry: Judgement and insight appear normal. Mood & affect appropriate.    Data Reviewed: Basic Metabolic Panel: Recent Labs  Lab 09/15/19 0630 09/16/19 0625 09/17/19 0537 09/18/19 0457 09/19/19 0456  NA 137 138 139 138 137  K 3.7  3.6 3.5 3.6 4.1  CL 101 100 98 99 98  CO2 27 29 31 27 28   GLUCOSE 224* 286* 91 154* 185*  BUN 18 17 16 16 16   CREATININE 0.90 0.82 0.85 0.75 0.81  CALCIUM 8.4* 8.4* 8.6* 8.5* 8.6*  MG  --  1.6* 1.8 1.8 1.9   Liver Function Tests: Recent Labs  Lab 09/13/19 1600    AST 26  ALT 41  ALKPHOS 362*  BILITOT 0.8  PROT 6.3*  ALBUMIN 3.0*   CBC: Recent Labs  Lab 09/13/19 1600 09/14/19 0650 09/15/19 0630 09/16/19 0625 09/17/19 0537 09/18/19 0457 09/19/19 0456  WBC 5.5   < > 5.2 5.6 6.1 5.6 5.7  NEUTROABS 3.7  --   --   --   --   --   --   HGB 11.8*   < > 12.1* 12.4* 11.8* 12.2* 12.4*  HCT 37.4*   < > 38.7* 38.4* 36.6* 38.7* 40.2  MCV 101.1*   < > 100.3* 98.0 98.4 98.2 99.3  PLT 215   < > 195 185 170 162 151   < > = values in this interval not displayed.   CBG (last 3)  Recent Labs    09/19/19 0238 09/19/19 0733 09/19/19 1106  GLUCAP 204* 197* 239*   Recent Results (from the past 240 hour(s))  SARS CORONAVIRUS 2 (TAT 6-24 HRS) Nasopharyngeal Nasopharyngeal Swab     Status: None   Collection Time: 09/13/19  5:02 PM   Specimen: Nasopharyngeal Swab  Result Value Ref Range Status   SARS Coronavirus 2 NEGATIVE NEGATIVE Final    Comment: (NOTE) SARS-CoV-2 target nucleic acids are NOT DETECTED. The SARS-CoV-2 RNA is generally detectable in upper and lower respiratory specimens during the acute phase of infection. Negative results do not preclude SARS-CoV-2 infection, do not rule out co-infections with other pathogens, and should not be used as the sole basis for treatment or other patient management decisions. Negative results must be combined with clinical observations, patient history, and epidemiological information. The expected result is Negative. Fact Sheet for Patients: SugarRoll.be Fact Sheet for Healthcare Providers: https://www.woods-mathews.com/ This test is not yet approved or cleared by the [place] FDA and  has been authorized for detection and/or diagnosis of SARS-CoV-2 by FDA under an Emergency Use Authorization (EUA). This EUA will remain  in effect (meaning this test can be used) for the duration of the COVID-19 declaration under Section 56 4(b)(1) of the Act, 21  U.S.C. section 360bbb-3(b)(1), unless the authorization is terminated or revoked sooner. Performed at [facility], [place] [address], [place], [place] [address]     Studies: No results found.   Scheduled Meds: . digoxin  0.125 mg Oral Daily  . furosemide  40 mg Intravenous Daily  . gabapentin  100 mg Oral TID  . insulin aspart  0-9 Units Subcutaneous TID WC  . insulin aspart  2 Units Subcutaneous TID WC  . insulin glargine  12 Units Subcutaneous Daily  . levothyroxine  37.5 mcg Oral Q0600  . lisinopril  5 mg Oral Daily  . potassium chloride  40 mEq Oral Daily  . rosuvastatin  5 mg Oral QPM  . spironolactone  12.5 mg Oral Daily  . tamsulosin  0.4 mg Oral QPC supper   Continuous Infusions: . heparin 1,100 Units/hr (09/19/19 0133)    Active Problems:   Uncontrolled type 2 diabetes mellitus with hyperglycemia (HCC)   Acute on chronic systolic (congestive) heart failure ([place])  Time spent:  Vassie Loll, MD [facility] 09/19/2019, 1:46 PM    LOS: 6 days

## 2019-09-19 NOTE — Progress Notes (Signed)
Inpatient Diabetes Program Recommendations  AACE/ADA: New Consensus Statement on Inpatient Glycemic Control (2015)  Target Ranges:  Prepandial:   less than 140 mg/dL      Peak postprandial:   less than 180 mg/dL (1-2 hours)      Critically ill patients:  140 - 180 mg/dL   Lab Results  Component Value Date   GLUCAP 197 (H) 09/19/2019   HGBA1C >18.5 (H) 08/01/2019    Review of Glycemic Control Results for Stanley Levy, Stanley Levy (MRN [id number]) as of 09/19/2019 09:27  Ref. Range 09/18/2019 16:21 09/18/2019 17:12 09/18/2019 21:41 09/19/2019 02:38 09/19/2019 07:33  Glucose-Capillary Latest Ref Range: 70 - 99 mg/dL 54 (L) 68 (L) 093 (H) 267 (H) 197 (H)   Diabetes history:DM2 Outpatient Diabetes medications:70/30 25 units BID Current orders for Inpatient glycemic control:Levemir 5 units QHS, Novolog 3 units tid meal coverage, Novolog 0-9units TID with meals  Inpatient Diabetes Program Recommendations:   Please consider: -Decrease Novolog meal coverage to 2 units tid -Custom Novolog correction scale 0-5 units tid      151-200  1 unit      201-250  2 units      251-300  3 units      301-350  4 units      351-400  5 units  Thank you, Darel Hong E. Mittie Knittel, RN, MSN, CDE  Diabetes Coordinator Inpatient Glycemic Control Team Team Pager [phone] (8am-5pm) 09/19/2019 9:29 AM

## 2019-09-19 NOTE — Progress Notes (Signed)
Report called to Rosanne Ashing, RN on 6E.

## 2019-09-20 ENCOUNTER — Encounter (HOSPITAL_COMMUNITY)
Admission: EM | Disposition: A | Discharge status: Home / Self Care | Payer: Self-pay | Source: Ambulatory Visit | Attending: Family Medicine

## 2019-09-20 DIAGNOSIS — I509 Heart failure, unspecified: Secondary | ICD-10-CM

## 2019-09-20 DIAGNOSIS — I428 Other cardiomyopathies: Secondary | ICD-10-CM

## 2019-09-20 HISTORY — PX: RIGHT/LEFT HEART CATH AND CORONARY ANGIOGRAPHY: CATH118266

## 2019-09-20 LAB — POCT I-STAT 7, (LYTES, BLD GAS, ICA,H+H)
Acid-Base Excess: 4 mmol/L — ABNORMAL HIGH (ref 0.0–2.0)
Bicarbonate: 28.4 mmol/L — ABNORMAL HIGH (ref 20.0–28.0)
Calcium, Ion: 1.23 mmol/L (ref 1.15–1.40)
HCT: 37 % — ABNORMAL LOW (ref 39.0–52.0)
Hemoglobin: 12.6 g/dL — ABNORMAL LOW (ref 13.0–17.0)
O2 Saturation: 98 %
Potassium: 4.1 mmol/L (ref 3.5–5.1)
Sodium: 137 mmol/L (ref 135–145)
TCO2: 30 mmol/L (ref 22–32)
pCO2 arterial: 43 mmHg (ref 32.0–48.0)
pH, Arterial: 7.428 (ref 7.350–7.450)
pO2, Arterial: 111 mmHg — ABNORMAL HIGH (ref 83.0–108.0)

## 2019-09-20 LAB — POCT I-STAT EG7
Acid-Base Excess: 5 mmol/L — ABNORMAL HIGH (ref 0.0–2.0)
Acid-Base Excess: 5 mmol/L — ABNORMAL HIGH (ref 0.0–2.0)
Bicarbonate: 30.4 mmol/L — ABNORMAL HIGH (ref 20.0–28.0)
Bicarbonate: 30.5 mmol/L — ABNORMAL HIGH (ref 20.0–28.0)
Calcium, Ion: 1.16 mmol/L (ref 1.15–1.40)
Calcium, Ion: 1.22 mmol/L (ref 1.15–1.40)
HCT: 35 % — ABNORMAL LOW (ref 39.0–52.0)
HCT: 36 % — ABNORMAL LOW (ref 39.0–52.0)
Hemoglobin: 11.9 g/dL — ABNORMAL LOW (ref 13.0–17.0)
Hemoglobin: 12.2 g/dL — ABNORMAL LOW (ref 13.0–17.0)
O2 Saturation: 68 %
O2 Saturation: 68 %
Potassium: 4 mmol/L (ref 3.5–5.1)
Potassium: 4.1 mmol/L (ref 3.5–5.1)
Sodium: 138 mmol/L (ref 135–145)
Sodium: 139 mmol/L (ref 135–145)
TCO2: 32 mmol/L (ref 22–32)
TCO2: 32 mmol/L (ref 22–32)
pCO2, Ven: 47.9 mmHg (ref 44.0–60.0)
pCO2, Ven: 48.4 mmHg (ref 44.0–60.0)
pH, Ven: 7.408 (ref 7.250–7.430)
pH, Ven: 7.411 (ref 7.250–7.430)
pO2, Ven: 35 mmHg (ref 32.0–45.0)
pO2, Ven: 36 mmHg (ref 32.0–45.0)

## 2019-09-20 LAB — GLUCOSE, CAPILLARY
Glucose-Capillary: 295 mg/dL — ABNORMAL HIGH (ref 70–99)
Glucose-Capillary: 230 mg/dL — ABNORMAL HIGH (ref 70–99)
Glucose-Capillary: 260 mg/dL — ABNORMAL HIGH (ref 70–99)
Glucose-Capillary: 307 mg/dL — ABNORMAL HIGH (ref 70–99)
Glucose-Capillary: 147 mg/dL — ABNORMAL HIGH (ref 70–99)

## 2019-09-20 SURGERY — RIGHT/LEFT HEART CATH AND CORONARY ANGIOGRAPHY
Anesthesia: LOCAL

## 2019-09-20 MED ORDER — FUROSEMIDE 10 MG/ML IJ SOLN
INTRAMUSCULAR | Status: AC
  Filled 2019-09-20: qty 4

## 2019-09-20 MED ORDER — FUROSEMIDE 10 MG/ML IJ SOLN
80.0000 mg | Freq: Two times a day (BID) | INTRAMUSCULAR | Status: DC
  Administered 2019-09-20: 80 mg via INTRAVENOUS
  Filled 2019-09-20: qty 8

## 2019-09-20 MED ORDER — LIDOCAINE HCL (PF) 1 % IJ SOLN
INTRAMUSCULAR | Status: AC
  Filled 2019-09-20: qty 30

## 2019-09-20 MED ORDER — SODIUM CHLORIDE 0.9% FLUSH
3.0000 mL | Freq: Two times a day (BID) | INTRAVENOUS | Status: DC
  Administered 2019-09-20 – 2019-09-22 (×4): 3 mL via INTRAVENOUS

## 2019-09-20 MED ORDER — FENTANYL CITRATE (PF) 100 MCG/2ML IJ SOLN
INTRAMUSCULAR | Status: AC
  Filled 2019-09-20: qty 2

## 2019-09-20 MED ORDER — MIDAZOLAM HCL 2 MG/2ML IJ SOLN
INTRAMUSCULAR | Status: DC | PRN
  Administered 2019-09-20: 1 mg via INTRAVENOUS

## 2019-09-20 MED ORDER — VERAPAMIL HCL 2.5 MG/ML IV SOLN
INTRAVENOUS | Status: DC | PRN
  Administered 2019-09-20: 10 mL via INTRA_ARTERIAL

## 2019-09-20 MED ORDER — HEPARIN SODIUM (PORCINE) 1000 UNIT/ML IJ SOLN
INTRAMUSCULAR | Status: DC | PRN
  Administered 2019-09-20: 5000 [IU] via INTRAVENOUS

## 2019-09-20 MED ORDER — HEPARIN (PORCINE) IN NACL 1000-0.9 UT/500ML-% IV SOLN
INTRAVENOUS | Status: AC
  Filled 2019-09-20: qty 1000

## 2019-09-20 MED ORDER — HEPARIN (PORCINE) IN NACL 1000-0.9 UT/500ML-% IV SOLN
INTRAVENOUS | Status: DC | PRN
  Administered 2019-09-20 (×2): 500 mL

## 2019-09-20 MED ORDER — FUROSEMIDE 10 MG/ML IJ SOLN
INTRAMUSCULAR | Status: AC
  Filled 2019-09-20: qty 8

## 2019-09-20 MED ORDER — SODIUM CHLORIDE 0.9 % IV SOLN: INTRAVENOUS | Status: AC

## 2019-09-20 MED ORDER — VERAPAMIL HCL 2.5 MG/ML IV SOLN
INTRAVENOUS | Status: AC
  Filled 2019-09-20: qty 2

## 2019-09-20 MED ORDER — FUROSEMIDE 10 MG/ML IJ SOLN
INTRAMUSCULAR | Status: DC | PRN
  Administered 2019-09-20: 80 mg via INTRAVENOUS

## 2019-09-20 MED ORDER — IOHEXOL 350 MG/ML SOLN
INTRAVENOUS | Status: DC | PRN
  Administered 2019-09-20: 55 mL via INTRA_ARTERIAL

## 2019-09-20 MED ORDER — FENTANYL CITRATE (PF) 100 MCG/2ML IJ SOLN
INTRAMUSCULAR | Status: DC | PRN
  Administered 2019-09-20: 25 ug via INTRAVENOUS

## 2019-09-20 MED ORDER — SODIUM CHLORIDE 0.9% FLUSH: 3.0000 mL | INTRAVENOUS | Status: DC | PRN

## 2019-09-20 MED ORDER — SODIUM CHLORIDE 0.9 % IV SOLN: 250.0000 mL | INTRAVENOUS | Status: DC | PRN

## 2019-09-20 MED ORDER — MIDAZOLAM HCL 2 MG/2ML IJ SOLN
INTRAMUSCULAR | Status: AC
  Filled 2019-09-20: qty 2

## 2019-09-20 MED ORDER — APIXABAN 5 MG PO TABS
5.0000 mg | ORAL_TABLET | Freq: Two times a day (BID) | ORAL | Status: DC
  Administered 2019-09-20 – 2019-09-22 (×4): 5 mg via ORAL
  Filled 2019-09-20 (×4): qty 1

## 2019-09-20 MED ORDER — LIDOCAINE HCL (PF) 1 % IJ SOLN
INTRAMUSCULAR | Status: DC | PRN
  Administered 2019-09-20 (×2): 2 mL via INTRADERMAL

## 2019-09-20 MED ORDER — HEPARIN SODIUM (PORCINE) 1000 UNIT/ML IJ SOLN
INTRAMUSCULAR | Status: AC
  Filled 2019-09-20: qty 1

## 2019-09-20 SURGICAL SUPPLY — 12 items
CATH BALLN WEDGE 5F 110CM (CATHETERS) ×1 IMPLANT
CATH OPTITORQUE TIG 4.0 5F (CATHETERS) ×1 IMPLANT
DEVICE RAD COMP TR BAND LRG (VASCULAR PRODUCTS) ×2 IMPLANT
GLIDESHEATH SLEND SS 6F .021 (SHEATH) ×1 IMPLANT
GUIDEWIRE .025 260CM (WIRE) ×1 IMPLANT
GUIDEWIRE INQWIRE 1.5J.035X260 (WIRE) IMPLANT
INQWIRE 1.5J .035X260CM (WIRE) ×2
KIT HEART LEFT (KITS) ×2 IMPLANT
PACK CARDIAC CATHETERIZATION (CUSTOM PROCEDURE TRAY) ×2 IMPLANT
SHEATH GLIDE SLENDER 4/5FR (SHEATH) ×1 IMPLANT
SHEATH PROBE COVER 6X72 (BAG) ×1 IMPLANT
TRANSDUCER W/STOPCOCK (MISCELLANEOUS) ×2 IMPLANT

## 2019-09-20 NOTE — Interval H&P Note (Signed)
History and Physical Interval Note:  09/20/2019 8:58 AM  Stanley Levy  has presented today for surgery, with the diagnosis of new onset combined systolic and diastolic heart failure -dilated cardiomyopathy.  The various methods of treatment have been discussed with the patient and family. After consideration of risks, benefits and other options for treatment, the patient has consented to  Procedure(s): RIGHT/LEFT HEART CATH AND CORONARY ANGIOGRAPHY (N/A)  PERCUTANEOUS CORONARY INTERVENTION  as a surgical intervention.  The patient's history has been reviewed, patient examined, no change in status, stable for surgery.  I have reviewed the patient's chart and labs.  Questions were answered to the patient's satisfaction.     Cath Lab Visit (complete for each Cath Lab visit)  Clinical Evaluation Leading to the Procedure:   ACS: No.  Non-ACS:    Anginal Classification: No Symptoms; NYHA class III CHF  Anti-ischemic medical therapy: Maximal Therapy (2 or more classes of medications)-max tolerated  Non-Invasive Test Results: High-risk stress test findings: cardiac mortality >3%/year  Prior CABG: No previous CABG   Stanley Levy

## 2019-09-20 NOTE — Progress Notes (Signed)
Patient transferred to cath lab at 0830hrs.

## 2019-09-20 NOTE — Progress Notes (Signed)
Patient returned from cath lab at 1018hrs.  Right radial site level zero, TR band in place with 14cc air.  Patient given post cath instructions, verbalized understanding.

## 2019-09-20 NOTE — Progress Notes (Signed)
Progress Note  Patient Name: Stanley Levy Date of Encounter: 09/20/2019  Primary Cardiologist: Prentice Docker, MD   Subjective   Denies any CP or SOB. Feeling good.   Inpatient Medications    Scheduled Meds: . digoxin  0.125 mg Oral Daily  . furosemide  40 mg Intravenous Daily  . gabapentin  100 mg Oral TID  . insulin aspart  0-9 Units Subcutaneous TID WC  . insulin aspart  2 Units Subcutaneous TID WC  . insulin glargine  12 Units Subcutaneous Daily  . levothyroxine  37.5 mcg Oral Q0600  . lisinopril  5 mg Oral Daily  . potassium chloride  40 mEq Oral Daily  . rosuvastatin  5 mg Oral QPM  . sodium chloride flush  3 mL Intravenous Q12H  . spironolactone  12.5 mg Oral Daily  . tamsulosin  0.4 mg Oral QPC supper   Continuous Infusions: . sodium chloride    . sodium chloride 10 mL/hr at 09/20/19 0755  . heparin 1,100 Units/hr (09/20/19 0755)   PRN Meds: sodium chloride, acetaminophen **OR** acetaminophen, ondansetron **OR** ondansetron (ZOFRAN) IV, polyethylene glycol, sodium chloride flush   Vital Signs    Vitals:   09/19/19 2054 09/20/19 0003 09/20/19 0450 09/20/19 0754  BP: 125/79 133/84 (!) 131/91 129/69  Pulse: 98 (!) 104 (!) 103 (!) 102  Resp:    18  Temp: 98.2 F (36.8 C) 97.8 F (36.6 C) 98 F (36.7 C)   TempSrc: Oral Oral Oral   SpO2: 100% 100% 92%   Weight:   86.5 kg   Height:        Intake/Output Summary (Last 24 hours) at 09/20/2019 0758 Last data filed at 09/20/2019 0755 Gross per 24 hour  Intake 1012.86 ml  Output 2625 ml  Net -1612.14 ml   Last 3 Weights 09/20/2019 09/19/2019 09/18/2019  Weight (lbs) 190 lb 11.2 oz 195 lb 5.2 oz 199 lb 4.7 oz  Weight (kg) 86.501 kg 88.6 kg 90.4 kg  Some encounter information is confidential and restricted. Go to Review Flowsheets activity to see all data.      Telemetry    Borderline sinus tachycardia with HR around 100 - Personally Reviewed  ECG    Sinus rhythm with PVCs - Personally  Reviewed  Physical Exam   GEN: No acute distress.   Neck: No JVD Cardiac: RRR, no murmurs, rubs, or gallops.  Respiratory: Clear to auscultation bilaterally. GI: Soft, nontender, non-distended  MS: No edema; No deformity. Neuro:  Nonfocal  Psych: Normal affect   Labs    High Sensitivity Troponin:   Recent Labs  Lab 08/24/19 1709 08/24/19 2050 09/13/19 1600 09/13/19 1811  TROPONINIHS 16 21* 20* 25*      Chemistry Recent Labs  Lab 09/13/19 1600 09/14/19 0650 09/17/19 0537 09/18/19 0457 09/19/19 0456  NA 135   < > 139 138 137  K 3.6   < > 3.5 3.6 4.1  CL 101   < > 98 99 98  CO2 25   < > 31 27 28   GLUCOSE 297*   < > 91 154* 185*  BUN 20   < > 16 16 16   CREATININE 0.90   < > 0.85 0.75 0.81  CALCIUM 8.4*   < > 8.6* 8.5* 8.6*  PROT 6.3*  --   --   --   --   ALBUMIN 3.0*  --   --   --   --   AST 26  --   --   --   --  ALT 41  --   --   --   --   ALKPHOS 362*  --   --   --   --   BILITOT 0.8  --   --   --   --   GFRNONAA >60   < > >60 >60 >60  GFRAA >60   < > >60 >60 >60  ANIONGAP 9   < > 10 12 11    < > = values in this interval not displayed.     Hematology Recent Labs  Lab 09/17/19 0537 09/18/19 0457 09/19/19 0456  WBC 6.1 5.6 5.7  RBC 3.72* 3.94* 4.05*  HGB 11.8* 12.2* 12.4*  HCT 36.6* 38.7* 40.2  MCV 98.4 98.2 99.3  MCH 31.7 31.0 30.6  MCHC 32.2 31.5 30.8  RDW 15.3 15.1 15.0  PLT 170 162 151    BNP Recent Labs  Lab 09/13/19 1600  BNP 1,082.0*     DDimer No results for input(s): DDIMER in the last 168 hours.   Radiology    No results found.  Cardiac Studies   Echo 08/16/2019 1. Left ventricular ejection fraction, by estimation, is 20%. The left  ventricle has severely decreased function. The left ventricle demonstrates  global hypokinesis with regional variation.  2. Large LV mural thrombus in distal anterolateral position,  approximately 2.0 x 2.5 cm (trabecular muscle obscures extent to some  degree). Reported to Dr. 08/18/2019.   3. Right ventricular systolic function is moderately reduced. The right  ventricular size is normal. There is mildly elevated pulmonary artery  systolic pressure. The estimated right ventricular systolic pressure is  32.4 mmHg.  4. Left atrial size was mildly dilated.  5. Right atrial size was moderately dilated.  6. The mitral valve is grossly normal. Trivial mitral valve  regurgitation.  7. The aortic valve is tricuspid. Aortic valve regurgitation is not  visualized.  8. The inferior vena cava is normal in size with greater than 50%  respiratory variability, suggesting right atrial pressure of 3 mmHg.   Patient Profile     64 y.o. male with PMH of HTN, HLD, DM II, medical noncompliance and h/o CVA admitted from cardiology office for acute on chronic systolic CHF. He was recently diagnosed with new cardiomyopathy with EF 20% and large LV mural thrombus in Feb. Eliquis was started at that time. Did not consider coumadin due to h/o noncompliance. Readmitted from office with acute CHF on 3/16. Transferred to [facility] on 3/22 as patient near euvolemic level in anticipation of L&RHC.   Assessment & Plan    1. Acute on chronic systolic heart failure  - continue digoxin, lisinopril, and spironolactone. Plan to add low dose coreg this afternoon if L&RHC shows stable CI/CO and normal right heart pressure.   - on board for L&RHC today. Appears to be euvolemic at this time. Nurse to hold this morning's dose of lasix.   - Risk and benefit of procedure explained to the patient who display clear understanding and agree to proceed. Discussed with patient possible procedural risk include bleeding, vascular injury, renal injury, arrythmia, MI, stroke and loss of limb or life.  2. LV thrombus  - diagnosed during previous admission in Feb 2021.   - placed on Eliquis instead of coumadin due to h/o noncompliance  - currently Eliquis on hold, continue heparin until post cath, then restart Eliquis  3.  Uncontrolled DM II: Hgb A1C > 18.5 in Feb 2021  4. Hypothyroidism: TSH elevated to 15.9 in Feb 2021.  On synthroid   For questions or updates, please contact [place] Please consult [email or web address] for contact info under        Signed, Almyra Deforest, Foreman  09/20/2019, 7:58 AM

## 2019-09-21 DIAGNOSIS — L8990 Pressure ulcer of unspecified site, unspecified stage: Secondary | ICD-10-CM | POA: Insufficient documentation

## 2019-09-21 DIAGNOSIS — I5043 Acute on chronic combined systolic (congestive) and diastolic (congestive) heart failure: Secondary | ICD-10-CM

## 2019-09-21 LAB — BASIC METABOLIC PANEL
Anion gap: 13 (ref 5–15)
BUN: 14 mg/dL (ref 8–23)
CO2: 25 mmol/L (ref 22–32)
Calcium: 8.9 mg/dL (ref 8.9–10.3)
Chloride: 99 mmol/L (ref 98–111)
Creatinine, Ser: 0.96 mg/dL (ref 0.61–1.24)
GFR calc Af Amer: >60 mL/min (ref >60)
GFR calc non Af Amer: >60 mL/min (ref >60)
Glucose, Bld: 182 mg/dL — ABNORMAL HIGH (ref 70–99)
Potassium: 4.1 mmol/L (ref 3.5–5.1)
Sodium: 137 mmol/L (ref 135–145)

## 2019-09-21 LAB — GLUCOSE, CAPILLARY
Glucose-Capillary: 183 mg/dL — ABNORMAL HIGH (ref 70–99)
Glucose-Capillary: 232 mg/dL — ABNORMAL HIGH (ref 70–99)
Glucose-Capillary: 230 mg/dL — ABNORMAL HIGH (ref 70–99)
Glucose-Capillary: 142 mg/dL — ABNORMAL HIGH (ref 70–99)
Glucose-Capillary: 272 mg/dL — ABNORMAL HIGH (ref 70–99)

## 2019-09-21 LAB — CBC
HCT: 38.9 % — ABNORMAL LOW (ref 39.0–52.0)
Hemoglobin: 12.8 g/dL — ABNORMAL LOW (ref 13.0–17.0)
MCH: 32.1 pg (ref 26.0–34.0)
MCHC: 32.9 g/dL (ref 30.0–36.0)
MCV: 97.5 fL (ref 80.0–100.0)
Platelets: 126 10*3/uL — ABNORMAL LOW (ref 150–400)
RBC: 3.99 MIL/uL — ABNORMAL LOW (ref 4.22–5.81)
RDW: 15 % (ref 11.5–15.5)
WBC: 6.2 10*3/uL (ref 4.0–10.5)
nRBC: 0 % (ref 0.0–0.2)

## 2019-09-21 MED ORDER — METOPROLOL SUCCINATE ER 25 MG PO TB24
25.0000 mg | ORAL_TABLET | Freq: Every day | ORAL | Status: DC
  Administered 2019-09-21 – 2019-09-22 (×2): 25 mg via ORAL
  Filled 2019-09-21 (×2): qty 1

## 2019-09-21 MED ORDER — FUROSEMIDE 40 MG PO TABS
40.0000 mg | ORAL_TABLET | Freq: Two times a day (BID) | ORAL | Status: DC
  Administered 2019-09-21 – 2019-09-22 (×3): 40 mg via ORAL
  Filled 2019-09-21 (×3): qty 1

## 2019-09-21 MED ORDER — LOSARTAN POTASSIUM 25 MG PO TABS
12.5000 mg | ORAL_TABLET | Freq: Every day | ORAL | Status: DC
  Administered 2019-09-22: 12.5 mg via ORAL
  Filled 2019-09-21: qty 1

## 2019-09-21 MED ORDER — INSULIN ASPART 100 UNIT/ML ~~LOC~~ SOLN
0.0000 [IU] | Freq: Every day | SUBCUTANEOUS | Status: DC
  Administered 2019-09-21: 3 [IU] via SUBCUTANEOUS

## 2019-09-21 MED ORDER — INSULIN ASPART 100 UNIT/ML ~~LOC~~ SOLN: 0.0000 [IU] | Freq: Three times a day (TID) | SUBCUTANEOUS | Status: DC

## 2019-09-21 NOTE — Patient Outreach (Signed)
Referral for [facility] Discharge to [facility] for processing. LMComer

## 2019-09-21 NOTE — Progress Notes (Signed)
Progress Note  Patient Name: Stanley Levy Date of Encounter: 09/21/2019  Primary Cardiologist: Prentice Docker, MD  Subjective   Doing well, no complaints. Put out a lot of urine yesterday, can't tell a difference in his breathing. Hasn't been out of bed, discussed working with PT today. Reviewed plan for medications. All questions answered.  Inpatient Medications    Scheduled Meds: . apixaban  5 mg Oral BID  . digoxin  0.125 mg Oral Daily  . furosemide  40 mg Oral BID  . gabapentin  100 mg Oral TID  . insulin aspart  0-9 Units Subcutaneous TID WC  . insulin aspart  2 Units Subcutaneous TID WC  . insulin glargine  12 Units Subcutaneous Daily  . levothyroxine  37.5 mcg Oral Q0600  . lisinopril  5 mg Oral Daily  . metoprolol succinate  25 mg Oral Daily  . potassium chloride  40 mEq Oral Daily  . rosuvastatin  5 mg Oral QPM  . sodium chloride flush  3 mL Intravenous Q12H  . spironolactone  12.5 mg Oral Daily  . tamsulosin  0.4 mg Oral QPC supper   Continuous Infusions: . sodium chloride     PRN Meds: sodium chloride, acetaminophen **OR** acetaminophen, ondansetron **OR** ondansetron (ZOFRAN) IV, polyethylene glycol, sodium chloride flush   Vital Signs    Vitals:   09/20/19 1945 09/20/19 2017 09/20/19 2300 09/21/19 0309  BP:  108/63  (!) 118/50  Pulse: 92 74  91  Resp:  20  18  Temp:  98 F (36.7 C)  97.7 F (36.5 C)  TempSrc:  Oral  Oral  SpO2: 100% 100% 99% 97%  Weight:    85.4 kg  Height:        Intake/Output Summary (Last 24 hours) at 09/21/2019 0826 Last data filed at 09/21/2019 0617 Gross per 24 hour  Intake 659.87 ml  Output 5225 ml  Net -4565.13 ml   Last 3 Weights 09/21/2019 09/20/2019 09/19/2019  Weight (lbs) 188 lb 4.4 oz 190 lb 11.2 oz 195 lb 5.2 oz  Weight (kg) 85.4 kg 86.501 kg 88.6 kg  Some encounter information is confidential and restricted. Go to Review Flowsheets activity to see all data.      Telemetry    Sinus rhythm 80s-90s with  occasional PVCs - Personally Reviewed  ECG    No new since 09/13/19 - Personally Reviewed  Physical Exam   GEN: No acute distress.   Neck: No JVD Cardiac: RRR, no murmurs, rubs, or gallops.  Respiratory: Clear to auscultation bilaterally. GI: Soft, nontender, non-distended  MS: Wrinkling legs and only trace residual bilateral LE edema; No deformity. Neuro:  Nonfocal  Psych: Normal affect   Labs    High Sensitivity Troponin:   Recent Labs  Lab 08/24/19 1709 08/24/19 2050 09/13/19 1600 09/13/19 1811  TROPONINIHS 16 21* 20* 25*      Chemistry Recent Labs  Lab 09/18/19 0457 09/18/19 0457 09/19/19 0456 09/19/19 0456 09/20/19 0933 09/20/19 0943 09/21/19 0414  NA 138   < > 137   < > 137 138  139 137  K 3.6   < > 4.1   < > 4.1 4.1  4.0 4.1  CL 99  --  98  --   --   --  99  CO2 27  --  28  --   --   --  25  GLUCOSE 154*  --  185*  --   --   --  182*  BUN 16  --  16  --   --   --  14  CREATININE 0.75  --  0.81  --   --   --  0.96  CALCIUM 8.5*  --  8.6*  --   --   --  8.9  GFRNONAA >60  --  >60  --   --   --  >60  GFRAA >60  --  >60  --   --   --  >60  ANIONGAP 12  --  11  --   --   --  13   < > = values in this interval not displayed.     Hematology Recent Labs  Lab 09/18/19 0457 09/18/19 0457 09/19/19 0456 09/19/19 0456 09/20/19 0933 09/20/19 0943 09/21/19 0414  WBC 5.6  --  5.7  --   --   --  6.2  RBC 3.94*  --  4.05*  --   --   --  3.99*  HGB 12.2*   < > 12.4*   < > 12.6* 12.2*  11.9* 12.8*  HCT 38.7*   < > 40.2   < > 37.0* 36.0*  35.0* 38.9*  MCV 98.2  --  99.3  --   --   --  97.5  MCH 31.0  --  30.6  --   --   --  32.1  MCHC 31.5  --  30.8  --   --   --  32.9  RDW 15.1  --  15.0  --   --   --  15.0  PLT 162  --  151  --   --   --  126*   < > = values in this interval not displayed.    BNPNo results for input(s): BNP, PROBNP in the last 168 hours.   DDimer No results for input(s): DDIMER in the last 168 hours.   Radiology    CARDIAC  CATHETERIZATION  Result Date: 09/20/2019  Hemodynamic findings consistent with moderate pulmonary hypertension.  LV end diastolic pressure is severely elevated.  Angiographically normal coronary arteries; large draping vessels. Left dominant system  SUMMARY  Nonischemic cardiomyopathy: Angiographically normal coronary arteries with Left dominant system  Severe combined systolic and diastolic cardiomyopathy: EF severely reduced by EF, PCWP of 30 with LVEDP of 36. RECOMMENDATIONS  Return to nursing unit for ongoing care.  We will increase Lasix to 80 mg twice daily.  80 mg given in Cath Lab.  Continue to titrate guideline directed medical management for nonischemic cardiomyopathy. [name], MD   Cardiac Studies   Cath 09/20/19 noted above  Echo 08/16/19 1. Left ventricular ejection fraction, by estimation, is 20%. The left  ventricle has severely decreased function. The left ventricle demonstrates  global hypokinesis with regional variation.  2. Large LV mural thrombus in distal anterolateral position,  approximately 2.0 x 2.5 cm (trabecular muscle obscures extent to some  degree). Reported to Dr. Mariea Clonts.  3. Right ventricular systolic function is moderately reduced. The right  ventricular size is normal. There is mildly elevated pulmonary artery  systolic pressure. The estimated right ventricular systolic pressure is  32.4 mmHg.  4. Left atrial size was mildly dilated.  5. Right atrial size was moderately dilated.  6. The mitral valve is grossly normal. Trivial mitral valve  regurgitation.  7. The aortic valve is tricuspid. Aortic valve regurgitation is not  visualized.  8. The inferior vena cava is normal in size with greater than 50%  respiratory  variability, suggesting right atrial pressure of 3 mmHg.   Patient Profile     64 y.o. male with PMH of HTN, HLD, DM II, medical noncompliance and h/o CVA admitted from cardiology office for acute on chronic systolic CHF.  He was recently diagnosed with new cardiomyopathy with EF 20% and large LV mural thrombus in Feb. Eliquis was started at that time. Did not consider coumadin due to h/o noncompliance. Readmitted from office with acute CHF on 3/16. Transferred to [facility] on 3/22 as patient near euvolemic level in anticipation of L&RHC.   Assessment & Plan    Acute on chronic systolic heart failure -continue digoxin, lisinopril, and spironolactone. Ideally as an outpatient would prefer transition from lisinopril to entresto, but this may be limited by hypotension -cath consistent with nonischemic cardiomyopathy -filling pressures elevated. Diuresed nearly 4.7 L yesterday with 80 IV lasix BID. Will change to oral dosing. Was on 20 mg PO BID furosemide at home and had fluid accumulation, will start 40 mg oral BID today -starting low dose metoprolol succinate today. Monitor closely, has had hypotension at prior admission on carvedilol -needs continued heart failure education. Would benefit from home health visits for medication, vital signs, and glucose management post discharge  LV thrombus - diagnosed during previous admission in Feb 2021.  - placed on Eliquis instead of coumadin due to h/o noncompliance - apixaban restarted post cath 09/20/19  History of CVA: on apixaban and rosuvastatin.   Uncontrolled DM II: Hgb A1C > 18.5 in Feb 2021. Noted to have hypoglycemic episode while at [facility]. Will need close outpatient monitoring. -POC glucose has been 147-307 in last 24 hours. Receiving 5-9 units of aspart with meals (standing plus sliding scale). Also receiving glargine 12 units daily. -notes reports he was on 70/30 25 units BID at home, but had hypoglycemia while in the hospital on this dose. Difficult situation. 70/30 with AM and PM meals likely easier adherence for him, but need to monitor for hypoglycemia.  Hypothyroidism: TSH elevated to 15.9 in Feb 2021. On synthroid. Recheck 09/13/19 TSH was still 13.7.  Follow up post discharge to determine if dose needs adjustment.  Reported falls at home: asking PT to assess gait today and see if he would benefit from assistive devices or home PT  Disposition: pending tolerance of medications and PT assessment, anticipate in the next 24-48 hours.  For questions or updates, please contact [place] Please consult [email or web address] for contact info under    Signed, Buford Dresser, MD  09/21/2019, 8:26 AM

## 2019-09-21 NOTE — Progress Notes (Signed)
Inpatient Diabetes Program Recommendations  AACE/ADA: New Consensus Statement on Inpatient Glycemic Control (2015)  Target Ranges:  Prepandial:   less than 140 mg/dL      Peak postprandial:   less than 180 mg/dL (1-2 hours)      Critically ill patients:  140 - 180 mg/dL   Lab Results  Component Value Date   GLUCAP 232 (H) 09/21/2019   HGBA1C >18.5 (H) 08/01/2019    Review of Glycemic Control Results for BRODEY, Stanley Levy (MRN [id number]) as of 09/21/2019 10:44  Ref. Range 09/20/2019 08:23 09/20/2019 12:01 09/20/2019 16:11 09/20/2019 21:03 09/21/2019 03:09 09/21/2019 08:01  Glucose-Capillary Latest Ref Range: 70 - 99 mg/dL 888 (H) 280 (H) 034 (H) 147 (H) 183 (H) 232 (H)   Diabetes history:DM2 Outpatient Diabetes medications:70/30 25 units BID Current orders for Inpatient glycemic control:  Lantus 12 units Daily Novolog 0-9 units tid  Novolog 2 units tid meal coverage  Inpatient Diabetes Program Recommendations:    - Increase Lantus to 16 units  (70/30 equivalent to Lantus 16 units is 70/30 12 units bid)  Thanks,  Christena Deem RN, MSN, BC-ADM Inpatient Diabetes Coordinator Team Pager [phone] (8a-5p)

## 2019-09-22 LAB — BASIC METABOLIC PANEL
Anion gap: 10 (ref 5–15)
BUN: 21 mg/dL (ref 8–23)
CO2: 27 mmol/L (ref 22–32)
Calcium: 9 mg/dL (ref 8.9–10.3)
Chloride: 99 mmol/L (ref 98–111)
Creatinine, Ser: 1.05 mg/dL (ref 0.61–1.24)
GFR calc Af Amer: >60 mL/min (ref >60)
GFR calc non Af Amer: >60 mL/min (ref >60)
Glucose, Bld: 173 mg/dL — ABNORMAL HIGH (ref 70–99)
Potassium: 4.3 mmol/L (ref 3.5–5.1)
Sodium: 136 mmol/L (ref 135–145)

## 2019-09-22 LAB — GLUCOSE, CAPILLARY
Glucose-Capillary: 160 mg/dL — ABNORMAL HIGH (ref 70–99)
Glucose-Capillary: 209 mg/dL — ABNORMAL HIGH (ref 70–99)
Glucose-Capillary: 197 mg/dL — ABNORMAL HIGH (ref 70–99)

## 2019-09-22 MED ORDER — NOVOLOG MIX 70/30 FLEXPEN (70-30) 100 UNIT/ML ~~LOC~~ SUPN: 10.0000 [IU] | PEN_INJECTOR | Freq: Two times a day (BID) | SUBCUTANEOUS | 11 refills | Status: DC

## 2019-09-22 MED ORDER — LOSARTAN POTASSIUM 25 MG PO TABS: 12.5000 mg | ORAL_TABLET | Freq: Every day | ORAL | 3 refills | Status: DC

## 2019-09-22 MED ORDER — INSULIN GLARGINE 100 UNIT/ML ~~LOC~~ SOLN
16.0000 [IU] | Freq: Every day | SUBCUTANEOUS | Status: DC
  Administered 2019-09-22: 16 [IU] via SUBCUTANEOUS
  Filled 2019-09-22: qty 0.16

## 2019-09-22 MED ORDER — FUROSEMIDE 40 MG PO TABS: 40.0000 mg | ORAL_TABLET | Freq: Two times a day (BID) | ORAL | 3 refills | Status: DC

## 2019-09-22 MED ORDER — POTASSIUM CHLORIDE CRYS ER 20 MEQ PO TBCR: 40.0000 meq | EXTENDED_RELEASE_TABLET | Freq: Every day | ORAL | 3 refills | Status: DC

## 2019-09-22 MED ORDER — LANCETS 30G MISC: 11 refills | Status: AC

## 2019-09-22 MED ORDER — METOPROLOL SUCCINATE ER 25 MG PO TB24: 25.0000 mg | ORAL_TABLET | Freq: Every day | ORAL | 3 refills | Status: DC

## 2019-09-22 NOTE — Progress Notes (Signed)
LATE ENTRY  PHYSICAL THERAPY EVALUATION   HISTORY OF ILLNESS: 64 y.o. male with medical history significant for systolic congestive heart failure, hypertension, diabetes mellitus, history of noncompliance.  Patient presented today to his cardiologist office 3/16 with his sister-in-law, she reported dyspnea and orthopnea over the past 2 to 3 weeks with lower extremity swelling.  Patient also reported swelling and weeping of his bilateral lower extremities and swelling of his hands. 3/23 RIGHT/LEFT HEART CATH AND CORONARY ANGIOGRAPHY (N/A) PERCUTANEOUS CORONARY INTERVENTION.  CLINICAL IMPRESSION: PTA  Was living home with spouse and states was independent, he had fall in not too distant past and started using cane for ambulation, states that of late he feels he has not bee needing this any more. He c/o some pain on bottom of feet with prolonged ambulation but does not recall what physician had called this condition. From explanation sounds like diabetic neuropathy. This pm pt did well with mobility and was able to complete all tasks given with stand by to min guard assist including ambulation up to 239ft. Pt did exhibit some balance and coordination deficits, will benefit from continued PT tx while in hospital to address this and also deficits noted in independence, safety and activity tolerance. At dc pt may go home but would greatly benefit from home health therapy.  DC RECOMMENDATION: Home with Home Health PT   09/21/19 1400  PT Visit Information  Last PT Received On 09/21/19  Assistance Needed +1  History of Present Illness 64 y.o. male with medical history significant for systolic congestive heart failure, hypertension, diabetes mellitus, history of noncompliance.  Patient presented today to his cardiologist office 3/16 with his sister-in-law, she reported dyspnea and orthopnea over the past 2 to 3 weeks with lower extremity swelling.  Patient also reported swelling and weeping of his bilateral  lower extremities and swelling of his hands. 3/23 RIGHT/LEFT HEART CATH AND CORONARY ANGIOGRAPHY (N/A) PERCUTANEOUS CORONARY INTERVENTION.  Precautions  Precautions Fall  Restrictions  Weight Bearing Restrictions No  Home Living  Family/patient expects to be discharged to: Private residence  Living Arrangements Spouse/significant other  Available Help at Discharge Family  Type of Home House  Home Access Stairs to enter  Entrance Stairs-Number of Steps 5  Entrance Stairs-Rails None (not on front porch)  Home Layout One level  Database administrator Yes  Home Equipment [place] - single point  Prior Function  Level of Independence Independent with assistive device(s)  Comments was using cane but states he has been getting better  Communication  Communication No difficulties  Pain Assessment  Pain Assessment No/denies pain  Cognition  Arousal/Alertness Awake/alert  Behavior During Therapy WFL for tasks assessed/performed  Overall Cognitive Status No family/caregiver present to determine baseline cognitive functioning  General Comments keeps forgetting precautions on RUE  Upper Extremity Assessment  Upper Extremity Assessment Generalized weakness  Lower Extremity Assessment  Lower Extremity Assessment Generalized weakness  Cervical / Trunk Assessment  Cervical / Trunk Assessment Normal  Bed Mobility  Overal bed mobility Needs Assistance  Bed Mobility Supine to Sit;Sit to Supine  Supine to sit Supervision  Sit to supine Supervision  General bed mobility comments needs set up and line management also reminders for RUE use  Transfers  Overall transfer level Needs assistance  Equipment used None  Transfers Sit to/from Stand;Stand Pivot Transfers  Sit to Stand Supervision;Min guard  Stand pivot transfers Supervision;Min guard  Ambulation/Gait  Ambulation/Gait assistance Min guard  Gait  Distance (Feet) 200 Feet   Assistive device None  Gait Pattern/deviations Step-through pattern  General Gait Details noted decreased balance and coordination with ambulation this pm  Gait velocity dec  Balance  Overall balance assessment Needs assistance  Sitting-balance support Feet supported  Sitting balance-Leahy Scale Good  Standing balance support During functional activity  Standing balance-Leahy Scale Fair  PT - End of Session  Equipment Utilized During Treatment Gait belt  Activity Tolerance Patient limited by fatigue;Patient tolerated treatment well  Patient left in bed;with call bell/phone within reach  Nurse Communication Mobility status  PT Assessment  PT Recommendation/Assessment Patient needs continued PT services  PT Visit Diagnosis Unsteadiness on feet (R26.81);Other abnormalities of gait and mobility (R26.89);Muscle weakness (generalized) (M62.81)  PT Problem List Decreased strength;Decreased activity tolerance;Decreased balance;Decreased mobility;Decreased coordination;Decreased knowledge of use of DME;Decreased safety awareness  PT Plan  PT Frequency (ACUTE ONLY) Min 3X/week  PT Treatment/Interventions (ACUTE ONLY) DME instruction;Gait training;Stair training;Functional mobility training;Therapeutic activities;Therapeutic exercise;Balance training;Neuromuscular re-education;Patient/family education  AM-PAC PT "6 Clicks" Mobility Outcome Measure (Version 2)  Help needed turning from your back to your side while in a flat bed without using bedrails? 4  Help needed moving from lying on your back to sitting on the side of a flat bed without using bedrails? 4  Help needed moving to and from a bed to a chair (including a wheelchair)? 3  Help needed standing up from a chair using your arms (e.g., wheelchair or bedside chair)? 3  Help needed to walk in hospital room? 3  Help needed climbing 3-5 steps with a railing?  2  6 Click Score 19  Consider Recommendation of Discharge To: Home with [facility]  PT  Recommendation  Follow Up Recommendations Supervision - Intermittent  PT equipment None recommended by PT  Individuals Consulted  Consulted and Agree with Results and Recommendations Patient  Acute Rehab PT Goals  Patient Stated Goal return home  PT Goal Formulation With patient  Time For Goal Achievement 10/05/19  Potential to Achieve Goals Good  PT Time Calculation  PT Start Time (ACUTE ONLY) 1446  PT Stop Time (ACUTE ONLY) 1503  PT Time Calculation (min) (ACUTE ONLY) 17 min  PT General Charges  $$ ACUTE PT VISIT 1 Visit  PT Evaluation  $PT Eval Moderate Complexity 1 Mod  Written Expression  Dominant Hand Left    Horald Chestnut, PT

## 2019-09-22 NOTE — TOC Initial Note (Signed)
Transition of Care [facility]) - Initial/Assessment Note    Patient Details  Name: Stanley Levy MRN: [id number] Date of Birth: [birth year]  Transition of Care [facility]) CM/SW Contact:    Gala Lewandowsky, RN Phone Number: 09/22/2019, 12:57 PM  Clinical Narrative:    Pt presented for difficulty breathing and lowe extremity swelling. Prior to arrival patient was from home with spouse. Patient has Primary care physician Tereasa Coop FNP- patient gets to appointments without any problems. Case Manager discussed home health services with the patient- patient is declining home health at this time. Case Manager did make patient aware that if he needs services in the future to call his primary care provider. No further needs from Case Manager at this time.                Expected Discharge Plan: Home/Self Care Barriers to Discharge: No Barriers Identified   Patient Goals and CMS Choice Patient states their goals for this hospitalization and ongoing recovery are:: "to return home"   Choice offered to / list presented to : NA  Expected Discharge Plan and Services Expected Discharge Plan: Home/Self Care In-house Referral: NA Discharge Planning Services: CM Consult Post Acute Care Choice: NA Living arrangements for the past 2 months: Single Family Home Expected Discharge Date: 09/22/19               DME Arranged: N/A    HH Arranged: Refused HH  Prior Living Arrangements/Services Living arrangements for the past 2 months: Single Family Home Lives with:: Spouse Patient language and need for interpreter reviewed:: Yes Do you feel safe going back to the place where you live?: Yes      Need for Family Participation in Patient Care: Yes (Comment) Care giver support system in place?: Yes (comment)   Criminal Activity/Legal Involvement Pertinent to Current Situation/Hospitalization: No - Comment as needed  Activities of Daily Living Home Assistive Devices/Equipment: Cane (specify quad or  straight) ADL Screening (condition at time of admission) Patient's cognitive ability adequate to safely complete daily activities?: No Is the patient deaf or have difficulty hearing?: No Does the patient have difficulty seeing, even when wearing glasses/contacts?: No Does the patient have difficulty concentrating, remembering, or making decisions?: No Patient able to express need for assistance with ADLs?: Yes Does the patient have difficulty dressing or bathing?: No Independently performs ADLs?: Yes (appropriate for developmental age) Does the patient have difficulty walking or climbing stairs?: Yes Weakness of Legs: Both Weakness of Arms/Hands: Both  Permission Sought/Granted Permission sought to share information with : Family Supports     Emotional Assessment Appearance:: Appears stated age Attitude/Demeanor/Rapport: Engaged Affect (typically observed): Appropriate Orientation: : Oriented to Situation, Oriented to  Time, Oriented to Place, Oriented to Self Alcohol / Substance Use: Not Applicable Psych Involvement: No (comment)  Admission diagnosis:  Acute on chronic systolic (congestive) heart failure (HCC) [I50.23] Acute on chronic congestive heart failure, unspecified heart failure type [facility]) [I50.9] Patient Active Problem List   Diagnosis Date Noted  . Pressure injury of skin 09/21/2019  . Acute on chronic combined systolic and diastolic CHF (congestive heart failure) (HCC) 09/13/2019  . At high risk for injury related to fall 09/08/2019  . Obesity (BMI 30.0-34.9) 09/08/2019  . Goals of care, counseling/discussion   . Advanced care planning/counseling discussion   . Palliative care by specialist   . Hypothyroidism 08/18/2019  . Cognitive changes/memory Challenges 08/18/2019  . LV (left ventricular) mural thrombus   . Acute systolic heart failure (HCC)   .  Severe left ventricular systolic dysfunction   . Hyperlipidemia LDL goal <70   . Memory loss 08/16/2019  . Falls  frequently 08/16/2019  . Fatigue 08/16/2019  . Essential hypertension 08/16/2019  . Elevated TSH 08/16/2019  . Encounter for hepatitis C screening test for low risk patient 08/16/2019  . Encounter for screening for malignant neoplasm of colon 08/16/2019  . Uncontrolled type 2 diabetes mellitus with hyperglycemia ([place]) 08/16/2019  . 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 % 41/96/2229  . CHF (congestive heart failure) ([place]) 08/15/2019  . Encounter for medication management 08/12/2019  . Hyperglycemia due to diabetes mellitus ([place]) 08/01/2019  . Weakness 08/01/2019  . Subacute confusional state 08/01/2019  . Anxiety disorder 11/25/2011   PCP:  Perlie Mayo, NP Pharmacy:   [id number] - [place], [address] [place] [address] Phone: [phone] Fax: [phone]     Social Determinants of Health (SDOH) Interventions    Readmission Risk Interventions Readmission Risk Prevention Plan 09/22/2019 08/02/2019  Post Dischage Appt - Complete  Medication Screening - Complete  Transportation Screening Complete Complete  PCP or Specialist Appt within 3-5 Days Complete -  HRI or Home Care Consult Complete -  Social Work Consult for [place] Planning/Counseling Complete -  Palliative Care Screening Complete -  Medication Review Press photographer) Complete -  Some recent data might be hidden

## 2019-09-22 NOTE — Progress Notes (Signed)
Physical Therapy Treatment Patient Details Name: Stanley Levy MRN: [id number] DOB: 03/12/1956 Today's Date: 09/22/2019    History of Present Illness 64 y.o. male with medical history significant for systolic congestive heart failure, hypertension, diabetes mellitus, history of noncompliance.  Patient presented today to his cardiologist office 3/16 with his sister-in-law, she reported dyspnea and orthopnea over the past 2 to 3 weeks with lower extremity swelling.  Patient also reported swelling and weeping of his bilateral lower extremities and swelling of his hands. 3/23 RIGHT/LEFT HEART CATH AND CORONARY ANGIOGRAPHY (N/A) PERCUTANEOUS CORONARY INTERVENTION.    PT Comments    Pt tolerated tx well today, was able to complete more tasks this session than last. Pt completed bed mob with mod I, able to sit unsupported edge of bed with no LOB and reach outside base of support. Sit<>stand with stand by assist, pt using cane this session, 1 loss of balance noted with transition but pt was able to self correct and not fall. Ambulated approx 246ft w/ cane and stand by assist/min guard assist. noted ataxic pattern today, ambulated w/ cane and seemed to be taking very small shuffled at times steps proprioceptive as if attempting to grasp at ground with feet. Worked on stair negotiation:5 steps with uni rail and cane, 3 steps with cane only needing min a to complete w/ LOB, pt states he has 5 steps and no rails at home. Not sure if pt is having off day today, he was noted to struggle with some simple tasks given to complete in room ie. wash face, brush teeth, seemed to be slightly disoriented with these needing cues and reminders to rinse his mouth or wash paste off around mouth. Pt will need intermitted supervision with ADLs etc once at home and will greatly benefit from Home health PT to address lingering deficits and home safety.    Follow Up Recommendations  Supervision - Intermittent;Home health PT      Equipment Recommendations  None recommended by PT    Recommendations for Other Services       Precautions / Restrictions Precautions Precautions: Fall Restrictions Weight Bearing Restrictions: No    Mobility  Bed Mobility Overal bed mobility: Modified Independent             General bed mobility comments: gets in and out of bed with mod I  Transfers Overall transfer level: Needs assistance Equipment used: Straight cane Transfers: Sit to/from Stand;Stand Pivot Transfers Sit to Stand: Supervision;Min guard Stand pivot transfers: Supervision;Min guard       General transfer comment: 1 LOB with standing prior to ambulation but able to self correct  Ambulation/Gait Ambulation/Gait assistance: Min guard Gait Distance (Feet): 200 Feet Assistive device: Straight cane Gait Pattern/deviations: Step-through pattern;Ataxic;Shuffle;Staggering left;Staggering right(proprioceptive gait) Gait velocity: dec   General Gait Details: noted ataxic pattern today, ambulated w/ cane and seemed to be taking very small shuffled at times steps proprioceptive as if attempting to grasp at ground with feet   Stairs Stairs: Yes Stairs assistance: Min guard;Min assist Stair Management: No rails;One rail Right;With cane Number of Stairs: 5 General stair comments: 5 steps with uni rail and cane, 3 steps with cane only needing min a to complete w/ LOB, pt states he has 5 steps and no rails at home   Wheelchair Mobility    Modified Rankin (Stroke Patients Only)       Balance Overall balance assessment: History of Falls;Needs assistance Sitting-balance support: Feet supported Sitting balance-Leahy Scale: Good  Standing balance support: During functional activity;Single extremity supported Standing balance-Leahy Scale: Fair                              Cognition Arousal/Alertness: Awake/alert Behavior During Therapy: WFL for tasks assessed/performed Overall  Cognitive Status: No family/caregiver present to determine baseline cognitive functioning                                 General Comments: pt given simple tasks to complete in room ie wash face brush teeth, seemed to be slightly disoriented with these needing cues and reminders like to rinse his mouth or wash paste off around mouth      Exercises      General Comments        Pertinent Vitals/Pain Pain Assessment: No/denies pain    Home Living                      Prior Function            PT Goals (current goals can now be found in the care plan section) Acute Rehab PT Goals Patient Stated Goal: return home PT Goal Formulation: With patient Time For Goal Achievement: 10/05/19 Potential to Achieve Goals: Good Progress towards PT goals: Progressing toward goals    Frequency    Min 3X/week      PT Plan Current plan remains appropriate    Co-evaluation              AM-PAC PT "6 Clicks" Mobility   Outcome Measure  Help needed turning from your back to your side while in a flat bed without using bedrails?: None Help needed moving from lying on your back to sitting on the side of a flat bed without using bedrails?: None Help needed moving to and from a bed to a chair (including a wheelchair)?: A Little Help needed standing up from a chair using your arms (e.g., wheelchair or bedside chair)?: A Little Help needed to walk in hospital room?: A Little Help needed climbing 3-5 steps with a railing? : A Lot 6 Click Score: 19    End of Session Equipment Utilized During Treatment: Gait belt Activity Tolerance: Patient limited by fatigue Patient left: in chair;with call bell/phone within reach   PT Visit Diagnosis: Unsteadiness on feet (R26.81);Other abnormalities of gait and mobility (R26.89);Muscle weakness (generalized) (M62.81)     Time: 9233-0076 PT Time Calculation (min) (ACUTE ONLY): 18 min  Charges:  $Gait Training: 8-22 mins                      Horald Chestnut, PT    Delford Field 09/22/2019, 10:54 AM

## 2019-09-22 NOTE — Discharge Summary (Addendum)
Discharge Summary    Patient ID: Stanley Levy MRN: [id number]; DOB: January 27, 1956  Admit date: 09/13/2019 Discharge date: 09/22/2019  Primary Care Provider: Perlie Mayo, NP  Primary Cardiologist: Stanley Sable, MD  Primary Electrophysiologist:  None   Discharge Diagnoses    Principal Problem:   Acute on chronic combined systolic and diastolic CHF (congestive heart failure) ([place]) Active Problems:   Encounter for medication management   Falls frequently   Essential hypertension   Elevated TSH   Uncontrolled type 2 diabetes mellitus with hyperglycemia ([place])   LV (left ventricular) mural thrombus   Hyperlipidemia LDL goal <70   Pressure injury of skin    Diagnostic Studies/Procedures    Right and left heart cath 09/20/19: Hemodynamic findings consistent with moderate pulmonary hypertension. LV end diastolic pressure is severely elevated. Angiographically normal coronary arteries; large draping vessels. Left dominant system   SUMMARY Nonischemic cardiomyopathy: Angiographically normal coronary arteries with Left dominant system Severe combined systolic and diastolic cardiomyopathy: EF severely reduced by EF, PCWP of 30 with LVEDP of 36.     RECOMMENDATIONS Return to nursing unit for ongoing care. We will increase Lasix to 80 mg twice daily.  80 mg given in Cath Lab. Continue to titrate guideline directed medical management for nonischemic cardiomyopathy. _____________   Echo 08/16/19: 1. Left ventricular ejection fraction, by estimation, is 20%. The left  ventricle has severely decreased function. The left ventricle demonstrates  global hypokinesis with regional variation.   2. Large LV mural thrombus in distal anterolateral position,  approximately 2.0 x 2.5 cm (trabecular muscle obscures extent to some  degree). Reported to Dr. Denton Levy.   3. Right ventricular systolic function is moderately reduced. The right  ventricular size is normal. There is mildly elevated  pulmonary artery  systolic pressure. The estimated right ventricular systolic pressure is  03.5 mmHg.   4. Left atrial size was mildly dilated.   5. Right atrial size was moderately dilated.   6. The mitral valve is grossly normal. Trivial mitral valve  regurgitation.   7. The aortic valve is tricuspid. Aortic valve regurgitation is not  visualized.   8. The inferior vena cava is normal in size with greater than 50%  respiratory variability, suggesting right atrial pressure of 3 mmHg.    History of Present Illness     ABDULMALIK Levy is a 64 y.o. male with a history of chronic systolic heart failure with EF 20%, LV thrombus on eliquis, HTN, HLD, uncontrolled DM, hx of CVA, hypothyroidism and hx of falls presented with CHF exacerbation. He presented to the office with a 30lb weight gain in 2 weeks.   He was recently admitted to [facility] in 08/2019 for an acute CHF exacerbation and was found to have a newly diagnosed cardiomyopathy with EF at 20% and global HK. Echo also showed a large LV mural thrombus, measuring approximately 2.0 x 2.5 cm. He was started on Eliquis over Coumadin for his thrombus given concerns for noncompliance. He diuresed well with Lasix and attempts were made to add Coreg and Lisinopril to his medication regimen but he developed hypotension with those. Options were reviewed with the patient in regards to medical therapy vs. transfer to [facility] for AHF consult and R/LHC. Palliative Care was also following the patient and he did not wish to undergo aggressive treatment at that time but to review options as an outpatient. He was started on Digoxin 0.140m daily along with Lisinopril 2.571mdaily and Spironolactone 12.58m54maily for  his cardiomyopathy and IV Lasix was transitioned to Lasix 61m BID.    In talking with the patient and his sister-in-law on admission, he reports worsening dyspnea on exertion and orthopnea for the past 2 to 3 weeks. He has not weighed himself on his  home scales but this has increased by over 30 pounds within the past 2 weeks. He has experienced worsening lower extremity edema along with swelling along his hands bilaterally. He complains that his pants keep getting soaked within an hour of putting them on due to the weeping along his legs. He denies any associated chest pain or palpitations. In review of his discharge instructions after last hospitalization, he didn't fill medications for 3-4 days after DC, but since has reported compliance  Hospital Course     Consultants: none  Acute on chronic systolic and diastolic heart failure Nonischemic cardiomyopathy, cath as above - R/L heart cath this admission with normal coronaries, suspect NICM - continue toprol, digoxin, losartan, spironolactone - unclear if BP would support low dose entresto - will try low dose 24-26 mg BID as outpatient if he is compliance on medications - diuresed on 80 mg IV lasix BID - weight today is 181 lbs from 232 lbs on admission - transitioned to 40 mg lasix BID + 40 mEq potassium   LV thrombus - diagnosed during previous admission 08/2019 - started on eliquis instead of coumadin due to hx of noncompliance   Hypertension Medications as above. Needs to keep a BP log.   Hx of CVA - eliquis and statin   DM - uncontrolled A1c 18.5% - 08/2019 Needs to monitored closely as OP [facility] final recommendations from diabetes coordinator as follows: 10 mg 70/30 BID WC.  D/C'ed inpatient lantus.   Hypothyroidism - TSH 15.9   Gait instability with falls - he walked in the halls yesterday and felt well   Pt seen and examined by Dr. CHarrell Gaveand deemed stable for discharge. Cardiology follow up has been arranged.   Did the patient have an acute coronary syndrome (MI, NSTEMI, STEMI, etc) this admission?:  No                               Did the patient have a percutaneous coronary intervention (stent / angioplasty)?:  No.     _____________  Discharge Vitals Blood pressure (!) 169/72, pulse 80, temperature 97.6 F (36.4 C), temperature source Oral, resp. rate 16, height 5' 9"  (1.753 m), weight 82.1 kg, SpO2 96 %.  Filed Weights   09/20/19 0450 09/21/19 0309 09/22/19 0411  Weight: 86.5 kg 85.4 kg 82.1 kg    Labs & Radiologic Studies    CBC Recent Labs    09/20/19 0943 09/21/19 0414  WBC  --  6.2  HGB 12.2*  11.9* 12.8*  HCT 36.0*  35.0* 38.9*  MCV  --  97.5  PLT  --  1914   Basic Metabolic Panel Recent Labs    09/21/19 0414 09/22/19 0313  NA 137 136  K 4.1 4.3  CL 99 99  CO2 25 27  GLUCOSE 182* 173*  BUN 14 21  CREATININE 0.96 1.05  CALCIUM 8.9 9.0   Liver Function Tests No results for input(s): AST, ALT, ALKPHOS, BILITOT, PROT, ALBUMIN in the last 72 hours. No results for input(s): LIPASE, AMYLASE in the last 72 hours. High Sensitivity Troponin:   Recent Labs  Lab 08/24/19  1709 08/24/19 2050 09/13/19 1600 09/13/19 1811  TROPONINIHS 16 21* 20* 25*    BNP Invalid input(s): POCBNP D-Dimer No results for input(s): DDIMER in the last 72 hours. Hemoglobin A1C No results for input(s): HGBA1C in the last 72 hours. Fasting Lipid Panel No results for input(s): CHOL, HDL, LDLCALC, TRIG, CHOLHDL, LDLDIRECT in the last 72 hours. Thyroid Function Tests No results for input(s): TSH, T4TOTAL, T3FREE, THYROIDAB in the last 72 hours.  Invalid input(s): FREET3 _____________  DG Chest 2 View  Result Date: 08/24/2019 CLINICAL DATA:  Lower extremity swelling. EXAM: CHEST - 2 VIEW COMPARISON:  08/19/2019 FINDINGS: The cardio pericardial silhouette is enlarged. There is pulmonary vascular congestion without overt pulmonary edema. No focal consolidation or airspace pulmonary edema. Left base atelectasis or infiltrate noted. Tiny bilateral pleural effusions evident. The visualized bony structures of the thorax are intact. IMPRESSION: Enlargement of the cardiopericardial silhouette with pulmonary  vascular congestion and tiny bilateral pleural effusions. Electronically Signed   By: Misty Stanley M.D.   On: 08/24/2019 18:28   CT Head Wo Contrast  Result Date: 09/08/2019 CLINICAL DATA:  Head trauma EXAM: CT HEAD WITHOUT CONTRAST TECHNIQUE: Contiguous axial images were obtained from the base of the skull through the vertex without intravenous contrast. COMPARISON:  08/01/2019 FINDINGS: Brain: No evidence of acute infarction, hemorrhage, hydrocephalus, extra-axial collection or mass lesion/mass effect. Periventricular and deep white matter hypodensity with encephalomalacia of the left frontal lobe. Vascular: No hyperdense vessel or unexpected calcification. Skull: Normal. Negative for fracture or focal lesion. Sinuses/Orbits: No acute finding. Other: None. IMPRESSION: 1.  No acute intracranial pathology. 2. 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   On: 09/08/2019 12:01   CARDIAC CATHETERIZATION  Result Date: 09/20/2019  Hemodynamic findings consistent with moderate pulmonary hypertension.  LV end diastolic pressure is severely elevated.  Angiographically normal coronary arteries; large draping vessels. Left dominant system  SUMMARY  Nonischemic cardiomyopathy: Angiographically normal coronary arteries with Left dominant system  Severe combined systolic and diastolic cardiomyopathy: EF severely reduced by EF, PCWP of 30 with LVEDP of 36. RECOMMENDATIONS  Return to nursing unit for ongoing care.  We will increase Lasix to 80 mg twice daily.  80 mg given in Cath Lab.  Continue to titrate guideline directed medical management for nonischemic cardiomyopathy. Glenetta Hew, MD  DG Chest Port 1 View  Result Date: 09/13/2019 CLINICAL DATA:  Short of breath EXAM: PORTABLE CHEST 1 VIEW COMPARISON:  08/24/2019, CT 08/15/2019 FINDINGS: Small right greater than left pleural effusions with possible  loculation on the right. Cardiomegaly with vascular congestion. Patchy airspace opacity at the left base. No pneumothorax. IMPRESSION: 1. Cardiomegaly with vascular congestion and small right greater than left pleural effusions. 2. Patchy atelectasis or pneumonia at the left base. Electronically Signed   By: Donavan Foil M.D.   On: 09/13/2019 16:08   Disposition   Pt is being discharged home today in good condition.  Follow-up Plans & Appointments    Follow-up Information     Isaiah Serge, NP Follow up on 10/03/2019.   Specialties: Cardiology, Radiology Why: 1:30 pm for [facility] hospital follow up Contact information: [address] [place] [address] [phone]           Discharge Instructions     Diet - low sodium heart healthy   Complete by: As directed    Diet - low sodium heart healthy   Complete  by: As directed    Discharge instructions   Complete by: As directed    No driving for 2 days. No lifting over 5 lbs for 1 week. No sexual activity for 1 week. Keep procedure site clean & dry. If you notice increased pain, swelling, bleeding or pus, call/return!  You may shower, but no soaking baths/hot tubs/pools for 1 week.   Increase activity slowly   Complete by: As directed    Increase activity slowly   Complete by: As directed        Discharge Medications   Allergies as of 09/22/2019   No Known Allergies      Medication List     STOP taking these medications    lisinopril 2.5 MG tablet Commonly known as: ZESTRIL       TAKE these medications    acetaminophen 325 MG tablet Commonly known as: TYLENOL Take 2 tablets (650 mg total) by mouth every 4 (four) hours as needed for headache or mild pain.   apixaban 5 MG Tabs tablet Commonly known as: ELIQUIS Take 1 tablet (5 mg total) by mouth 2 (two) times daily.   blood glucose meter kit and supplies Dispense based on patient and insurance preference. Use up to four times daily as directed. (FOR ICD-10  E10.9, E11.9).   blood glucose meter kit and supplies Kit Dispense based on patient and insurance preference. Use up to four times daily as directed. (FOR ICD-9 250.00, 250.01).   digoxin 0.125 MG tablet Commonly known as: LANOXIN Take 1 tablet (0.125 mg total) by mouth daily.   furosemide 40 MG tablet Commonly known as: LASIX Take 1 tablet (40 mg total) by mouth 2 (two) times daily. What changed:  medication strength how much to take   gabapentin 100 MG capsule Commonly known as: Neurontin Take 1 capsule (100 mg total) by mouth 3 (three) times daily.   Glucose Meter Test test strip Generic drug: glucose blood Use as instructed   Insulin Pen Needle 31G X 5 MM Misc 1 Units by Does not apply route 2 (two) times daily.   levothyroxine 75 MCG tablet Commonly known as: SYNTHROID Take 0.5 tablets (37.5 mcg total) by mouth daily before breakfast.   losartan 25 MG tablet Commonly known as: COZAAR Take 0.5 tablets (12.5 mg total) by mouth daily.   metoprolol succinate 25 MG 24 hr tablet Commonly known as: TOPROL-XL Take 1 tablet (25 mg total) by mouth daily.   NovoLOG Mix 70/30 FlexPen (70-30) 100 UNIT/ML FlexPen Generic drug: insulin aspart protamine - aspart Inject 0.1 mLs (10 Units total) into the skin 2 (two) times daily with a meal. Start tomorrow morning - 09/23/19 What changed:  how much to take additional instructions   OneTouch Delica Lancets 95K Misc Four times daily testing  dx e11.65 What changed: Another medication with the same name was added. Make sure you understand how and when to take each.   Lancets 30G Misc Check BG twice daily, once before breakfast and once before supper. What changed: You were already taking a medication with the same name, and this prescription was added. Make sure you understand how and when to take each.   potassium chloride SA 20 MEQ tablet Commonly known as: KLOR-CON Take 2 tablets (40 mEq total) by mouth daily.     rosuvastatin 5 MG tablet Commonly known as: Crestor Take 1 tablet (5 mg total) by mouth every evening.   spironolactone 25 MG tablet Commonly known as: ALDACTONE Take 0.5 tablets (  12.5 mg total) by mouth daily.   tamsulosin 0.4 MG Caps capsule Commonly known as: FLOMAX Take 1 capsule (0.4 mg total) by mouth daily after supper.   Vitamin D (Ergocalciferol) 1.25 MG (50000 UNIT) Caps capsule Commonly known as: DRISDOL Take 1 capsule (50,000 Units total) by mouth every 7 (seven) days.           Outstanding Labs/Studies    BP, titrate heart failure meds   Duration of Discharge Encounter   Greater than 30 minutes including physician time.  Signed, [place], PA 09/22/2019, 11:20 AM

## 2019-09-22 NOTE — Progress Notes (Signed)
Inpatient Diabetes Program Recommendations  AACE/ADA: New Consensus Statement on Inpatient Glycemic Control (2015)  Target Ranges:  Prepandial:   less than 140 mg/dL      Peak postprandial:   less than 180 mg/dL (1-2 hours)      Critically ill patients:  140 - 180 mg/dL   Lab Results  Component Value Date   GLUCAP 209 (H) 09/22/2019   HGBA1C >18.5 (H) 08/01/2019    Review of Glycemic Control Results for [facility], [facility] Levy (MRN [id number]) as of 09/22/2019 11:08  Ref. Range 09/21/2019 16:08 09/21/2019 21:24 09/22/2019 03:27 09/22/2019 08:28  Glucose-Capillary Latest Ref Range: 70 - 99 mg/dL 989 (H) 211 (H) 941 (H) 209 (H)   Diabetes history:DM2 Outpatient Diabetes medications:70/30 25 units BID Current orders for Inpatient glycemic control:  Lantus 16 units Daily Novolog 0-9 units tid  Novolog 2 units tid meal coverage  Inpatient Diabetes Program Recommendations:    Spoke with patient regarding outpatient diabetes management. States, "I take what they give me and turn it to 25 at breakfast and supper." Denies missing doses.  Reviewed patient's recent A1c of >18.5%. Explained what a A1c is and what it measures. Also reviewed goal A1c with patient, importance of good glucose control @ home, and blood sugar goals. Reviewed patho fo DM, need for insulin, role of pancreas, vascular changes, impact to heart function, and commorbidities.  When asked about sugary beverages patient states, "Oh I drink a lot of sweet tea, I cannot get enough of it." Reviewed alternatives, benefits of selecting other beverages, discussed plate method, incorporating in more vegetables, portion control and being mindful of carb intake.  Patient in agreement that he will work on this.  Patient is in need of lancets at discharge [phone]). Confirms he checks blood sugars 2 times per day and they usually run in the 200's. Admits to frequent low blood sugars and that he feels low in the 150's mg/dL. Stressed the importance  of eliminating hypoglycemic events, when to call MD and that he may need reduced insulin dosages. Curious if this is impacting risks for falling. In preparing for discharge, consider reducing Novolog 70/30 to 10 units BID (to start in AM).  Encouraged to follow up with PCP in the next 2 weeks regarding diabetes.   Thanks, Lujean Rave, MSN, RNC-OB Diabetes Coordinator [phone] (8a-5p)

## 2019-09-30 ENCOUNTER — Other Ambulatory Visit: Payer: PPO | Admitting: Internal Medicine

## 2019-09-30 ENCOUNTER — Other Ambulatory Visit: Payer: Self-pay

## 2019-09-30 NOTE — Progress Notes (Signed)
Cardiology Office Note   Date:  10/03/2019   ID:  Stanley, Levy 10-01-1955, MRN [id number]  PCP:  Stanley Mayo, NP  Cardiologist:  Dr. Bronson Ing     Chief Complaint  Patient presents with  . Hospitalization Follow-up    chf  . Cardiomyopathy      History of Present Illness: Stanley Levy is a 64 y.o. male who presents for post hospital for acute HF.  PMH of HTN, HLD, DM II, medical noncompliance and h/o CVA admitted from cardiology office for acute on chronic systolic CHF. He was recently diagnosed with new cardiomyopathy with EF 20% and large LV mural thrombus in Feb. Eliquis was started at that time. Did not consider coumadin due to h/o noncompliance. Readmitted from office 09/13/19 with acute CHF L&RHC.  Cath with NICM, + LV thrombus on eliquis , hx of CVA  uncontrolled on insulin,  Hypothyroidism with TSH to 15 on synthroid    Today we reviewed reasons for hospitalization -he stated he understood on review.  No SOB, no Chest pain, not eating salt and his wife agrees - she monitors his glucose and salt.  His BP is soft today but no lightheadedness or dizziness. No palpitations.  Has not missed meds, though when his glucose came back at 575 I called him back and he had not taken insulin today.   His PCP appt is next week.    Past Medical History:  Diagnosis Date  . Anxiety   . CHF (congestive heart failure) (HCC)    a. EF 20% with global HK by echo in 08/2019. Also noted to have LV mural thrombus.   . Diabetes mellitus, type II ([place])   . Hyperlipidemia   . Thyroid disease     Past Surgical History:  Procedure Laterality Date  . FOOT SURGERY    . RIGHT/LEFT HEART CATH AND CORONARY ANGIOGRAPHY N/A 09/20/2019   Procedure: RIGHT/LEFT HEART CATH AND CORONARY ANGIOGRAPHY;  Surgeon: Leonie Man, MD;  Location: [facility];  Service: Cardiovascular;  Laterality: N/A;     Current Outpatient Medications  Medication Sig Dispense Refill  . acetaminophen  (TYLENOL) 325 MG tablet Take 2 tablets (650 mg total) by mouth every 4 (four) hours as needed for headache or mild pain. 12 tablet 0  . apixaban (ELIQUIS) 5 MG TABS tablet Take 1 tablet (5 mg total) by mouth 2 (two) times daily. 60 tablet 3  . blood glucose meter kit and supplies KIT Dispense based on patient and insurance preference. Use up to four times daily as directed. (FOR ICD-9 250.00, 250.01). 1 each 0  . blood glucose meter kit and supplies Dispense based on patient and insurance preference. Use up to four times daily as directed. (FOR ICD-10 E10.9, E11.9). 1 each 0  . digoxin (LANOXIN) 0.125 MG tablet Take 1 tablet (0.125 mg total) by mouth daily. 30 tablet 3  . furosemide (LASIX) 40 MG tablet Take 1 tablet (40 mg total) by mouth 2 (two) times daily. 180 tablet 3  . gabapentin (NEURONTIN) 100 MG capsule Take 1 capsule (100 mg total) by mouth 3 (three) times daily. 90 capsule 2  . glucose blood (GLUCOSE METER TEST) test strip Use as instructed 400 each 3  . insulin aspart protamine - aspart (NOVOLOG MIX 70/30 FLEXPEN) (70-30) 100 UNIT/ML FlexPen Inject 0.1 mLs (10 Units total) into the skin 2 (two) times daily with a meal. Start tomorrow morning - 09/23/19 15 mL 11  .  Insulin Pen Needle 31G X 5 MM MISC 1 Units by Does not apply route 2 (two) times daily. 100 each 5  . Lancets 30G MISC Check BG twice daily, once before breakfast and once before supper. 50 each 11  . levothyroxine (SYNTHROID) 75 MCG tablet Take 0.5 tablets (37.5 mcg total) by mouth daily before breakfast. 15 tablet 3  . losartan (COZAAR) 25 MG tablet Take 0.5 tablets (12.5 mg total) by mouth daily. 90 tablet 3  . metoprolol succinate (TOPROL-XL) 25 MG 24 hr tablet Take 1 tablet (25 mg total) by mouth daily. 90 tablet 3  . OneTouch Delica Lancets 79G MISC Four times daily testing  dx e11.65 150 each 5  . potassium chloride SA (KLOR-CON) 20 MEQ tablet Take 2 tablets (40 mEq total) by mouth daily. 90 tablet 3  . rosuvastatin  (CRESTOR) 5 MG tablet Take 1 tablet (5 mg total) by mouth every evening. 30 tablet 3  . spironolactone (ALDACTONE) 25 MG tablet Take 0.5 tablets (12.5 mg total) by mouth daily. 15 tablet 3  . tamsulosin (FLOMAX) 0.4 MG CAPS capsule Take 1 capsule (0.4 mg total) by mouth daily after supper. 30 capsule 3  . Vitamin D, Ergocalciferol, (DRISDOL) 1.25 MG (50000 UNIT) CAPS capsule Take 1 capsule (50,000 Units total) by mouth every 7 (seven) days. 5 capsule 2   No current facility-administered medications for this visit.    Allergies:   Patient has no known allergies.    Social History:  The patient  reports that he has never smoked. He has never used smokeless tobacco. He reports that he does not drink alcohol or use drugs.   Family History:  The patient's family history includes Heart attack in his father.    ROS:  General:no colds or fevers, + weight loss further down Skin:no rashes or ulcers HEENT:no blurred vision, no congestion CV:see HPI PUL:see HPI GI:no diarrhea constipation or melena, no indigestion GU:no hematuria, no dysuria MS:no joint pain, no claudication Neuro:no syncope, no lightheadedness Endo:+ diabetes, + thyroid disease  Wt Readings from Last 3 Encounters:  10/03/19 178 lb (80.7 kg)  09/22/19 181 lb (82.1 kg)  09/13/19 241 lb (109.3 kg)     PHYSICAL EXAM: VS:  BP (!) 100/58   Pulse 75   Temp 98.6 F (37 C)   Ht 5' 9" (1.753 m)   Wt 178 lb (80.7 kg)   SpO2 98%   BMI 26.29 kg/m  , BMI Body mass index is 26.29 kg/m. General:Pleasant affect, NAD Skin:Warm and dry, brisk capillary refill HEENT:normocephalic, sclera clear, mucus membranes moist Neck:supple, no JVD, no bruits  Heart:S1S2 RRR without murmur, gallup, rub or click Lungs:clear without rales, rhonchi, or wheezes XQJ:JHER, non tender, + BS, do not palpate liver spleen or masses Ext:no lower ext edema, 2+ pedal pulses, 2+ radial pulses, ulcer on both heels, I looked at one on Lt foot,  Diabetic  ulcers scab has come off of one.   Neuro:alert and oriented X 3, MAE, follows commands, + facial symmetry    EKG:  EKG is NOT ordered today.    Recent Labs: 09/13/2019: ALT 41; B Natriuretic Peptide 1,082.0 09/19/2019: Magnesium 1.9 09/21/2019: Hemoglobin 12.8; Platelets 126 10/03/2019: BUN 21; Creatinine, Ser 1.10; Potassium 4.2; Sodium 135; TSH 8.634    Lipid Panel    Component Value Date/Time   CHOL 168 08/11/2019 0859   TRIG 185 (H) 08/11/2019 0859   HDL 56 08/11/2019 0859   CHOLHDL 3.0 08/11/2019 0859   [place]  84 08/11/2019 0859       Other studies Reviewed: Additional studies/ records that were reviewed today include: . Cardiac cath 09/20/19 CARDIAC CATHETERIZATION  Result Date: 09/20/2019  Hemodynamic findings consistent with moderate pulmonary hypertension.  LV end diastolic pressure is severely elevated.  Angiographically normal coronary arteries; large draping vessels. Left dominant system  SUMMARY  Nonischemic cardiomyopathy: Angiographically normal coronary arteries with Left dominant system  Severe combined systolic and diastolic cardiomyopathy: EF severely reduced by EF, PCWP of 30 with LVEDP of 36. RECOMMENDATIONS  Return to nursing unit for ongoing care.  We will increase Lasix to 80 mg twice daily.  80 mg given in Cath Lab.  Continue to titrate guideline directed medical management for nonischemic cardiomyopathy. Glenetta Hew, MD   Echo 09/20/19 Echo 08/16/19 1. Left ventricular ejection fraction, by estimation, is 20%. The left  ventricle has severely decreased function. The left ventricle demonstrates  global hypokinesis with regional variation. Reviewed hospital notes and labs, cardiac cath.  2. Large LV mural thrombus in distal anterolateral position,  approximately 2.0 x 2.5 cm (trabecular muscle obscures extent to some  degree). Reported to Dr. Denton Brick.  3. Right ventricular systolic function is moderately reduced. The right  ventricular size  is normal. There is mildly elevated pulmonary artery  systolic pressure. The estimated right ventricular systolic pressure is  14.4 mmHg.  4. Left atrial size was mildly dilated.  5. Right atrial size was moderately dilated.  6. The mitral valve is grossly normal. Trivial mitral valve  regurgitation.  7. The aortic valve is tricuspid. Aortic valve regurgitation is not  visualized.  8. The inferior vena cava is normal in size with greater than 50%  respiratory variability, suggesting right atrial pressure of 3 mmHg.   ASSESSMENT AND PLAN:  1.  LV thrombus on eliquis, no bleeding and is taking his meds 2.  NICM with EF 20% on BB dig, spironolactone and cozaar.  With his soft BP today did not increase.  Will see hm back in 2 weeks to see if his BP will stabilize by that time.  3.  Hypothyroid with TSH of 15.9 now back on synthroid and will recheck today his TSH.   4.  DM poorly controlled with A1c of 18.5  He is to see PCP next week. 5.  Gait instability walked in to appt without issues.  6.  HTN well controlled today to soft 7.  Hx of CVA on eliqis and statin.  8.  Foot ulcers on heels bil.  Scabs and slowly healing will check lower ext arterial dopplers to eval blood supply for healing.  9.  Non compliance episodically   Addendum  His glucose was 574 and when I called he said he had not yet taken the insulin.  He would take right away.  His gap was 10.  He will recheck CBG at home in an hour after glucose and let PCP know if still elevated.    TSH is improving to 8.634  Continue synthroid      Current medicines are reviewed with the patient today.  The patient Has no concerns regarding medicines.  The following changes have been made:  See above Labs/ tests ordered today include:see above  Disposition:   FU:  see above  Signed, Cecilie Kicks, NP  10/03/2019 4:20 PM    [facility] [place], [place], [place], [place] Phone: [phone]; Fax: [phone]  [phone]

## 2019-09-30 NOTE — Progress Notes (Deleted)
April 2nd, 2021 [facility] Consult Note Telephone: [phone]  Fax: [phone]  PATIENT NAME: Stanley Levy DOB: 1955/09/28 MRN: [id number] [address] [place] [address] [phone] (Home Phone) [phone] (Mobile)  PRIMARY CARE PROVIDER:   Perlie Mayo, NP  REFERRING PROVIDER:  Perlie Mayo, NP [address] [place],  [place] [address]  RESPONSIBLE PARTY:   Nadara Mode [phone] [facility] Phone)   ASSESSMENT / RECOMMENDATIONS:  1. Advance Care Planning: A. Directives: Not discussed this visit d/t time limitations/visit to establish trust B. Goals of Care: Wishes to increased his ability to exercise, gain in strength.   2. Symptom Management: Patient is A & O x 3. He is independent in ADLs.  A. CHF: Patient notes periodic episodes spontaneous dyspnea. Nightly orthopnea after sleeping about 2 hours. Marked LE edema pitting to high thigh, progressive since discharge from the hospital. Patient notes increase fluid weight in his legs, sometimes needing to use his arms to lift legs up. Reports weeping edema about calves noted in the am. He follows a low salt diet.              -I will drop off a scale to patients home in the next few days. Plan to have him weight himself qam to help monitor weight and provide guidance for diuresis.             -I had the patient take an extra 40 mg of Lasix at the time of my visit and changed his Lasix from 20m bid, to 420mqam.               -Needs enrollment into the heart failure clinic, for close monitoring of aggressive diuresis. He's scheduled to establish at the cardiology clinic March 16th.             -He'll be seen by his PCP March 11th.   B. Diabetes 2: Patient has been limiting the sweets in his diet. He was issued a new glucometer last hospitalization but lost the spring pen that launches the lancet. We looked around the house and finally located the glucometer and strips. We  also found a box of lancets that you dont need a pen to utilize. We watched a video on proper use of the glucometer and patient demonstrated good techniques. His CBG was 281. He has a log-book to write down his sugars and plans to test bid. Patient has his insulin pen and described its proper use/dosage/schedule.  C. Education: We went through patient medication list and discussed what each medication was for. I assisted patients wife [name] with filling the pill box; she did a really great job and needed very little assistance from me. He has been compliant with taking his medications.  will bring his med list with him to his next OV with his PCP on 09/08/2019. He plans to ask her about the imipramine, which is on his med list, but we dont have the bottle.   D. Falls: Patient tries to remember to ambulate with his cane. He episodically will experience spontaneous weakness in his hips and knees, which causes him to fall should he be up and about. Last fall 3 days ago. No warning. Denies any other associated symptoms (no CP/palpitation/dyspnea/lightheadedness/dizziness). Hes had a total of 4 falls since hospitalization, two of these off his porch.3  3. Family Supports: Lives with wife TeKarna Christmasmarried for 30 yrs. Patient is her caregiver (she has had strokes).  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 Enjoys lifting weights, yard work, watching TV, reading. Recently retired from working at [employer], after getting sick early Feb.  [name] has a twin sister [name] who drives patient to appointments.   4. Follow up Palliative Care Visit: Thurs 09/15/2019 @ 3:30pm @ the home. F/u with cardiologist : office to call to schedule apt.  I spent 90 minutes providing this consultation from 3pm to 4:40pm. More than 50% of the time in this consultation was spent coordinating communication.   HISTORY OF PRESENT ILLNESS:  Stanley Levy is a 64 y.o. male with h/o CHF (systolic), intracardiac thrombosis (Eliquis), DM2, hypothyroidism,  frequent falls, cognitive/memory challenges, hyperlipidemia, and anxiety. -2/11-1/05/2020 ER hyperglycemia glucose 562, A1C 18% -2/15-2/23/2021: Hospitalized with acute on chronic syst/diastolic CHF; (Echo 12/28/1599) EF 20%. Left ventricular mural thrombus. This is a f/u Palliative Care visit from March 9th..   CODE STATUS: full code  PPS: 60%  HOSPICE ELIGIBILITY/DIAGNOSIS: TBD PAST MEDICAL HISTORY:  Past Medical History:  Diagnosis Date   Anxiety    CHF (congestive heart failure) ([place])    a. EF 20% with global HK by echo in 08/2019. Also noted to have LV mural thrombus.    Diabetes mellitus, type II ([place])    Hyperlipidemia    Thyroid disease     SOCIAL HX:  Social History   Tobacco Use   Smoking status: Never Smoker   Smokeless tobacco: Never Used  Substance Use Topics   Alcohol use: No    ALLERGIES: No Known Allergies   PERTINENT MEDICATIONS:  Outpatient Encounter Medications as of 09/30/2019  Medication Sig   acetaminophen (TYLENOL) 325 MG tablet Take 2 tablets (650 mg total) by mouth every 4 (four) hours as needed for headache or mild pain.   apixaban (ELIQUIS) 5 MG TABS tablet Take 1 tablet (5 mg total) by mouth 2 (two) times daily.   blood glucose meter kit and supplies KIT Dispense based on patient and insurance preference. Use up to four times daily as directed. (FOR ICD-9 250.00, 250.01).   blood glucose meter kit and supplies Dispense based on patient and insurance preference. Use up to four times daily as directed. (FOR ICD-10 E10.9, E11.9).   digoxin (LANOXIN) 0.125 MG tablet Take 1 tablet (0.125 mg total) by mouth daily.   furosemide (LASIX) 40 MG tablet Take 1 tablet (40 mg total) by mouth 2 (two) times daily.   gabapentin (NEURONTIN) 100 MG capsule Take 1 capsule (100 mg total) by mouth 3 (three) times daily.   glucose blood (GLUCOSE METER TEST) test strip Use as instructed   insulin aspart protamine - aspart (NOVOLOG MIX 70/30 FLEXPEN)  (70-30) 100 UNIT/ML FlexPen Inject 0.1 mLs (10 Units total) into the skin 2 (two) times daily with a meal. Start tomorrow morning - 09/23/19   Insulin Pen Needle 31G X 5 MM MISC 1 Units by Does not apply route 2 (two) times daily.   Lancets 30G MISC Check BG twice daily, once before breakfast and once before supper.   levothyroxine (SYNTHROID) 75 MCG tablet Take 0.5 tablets (37.5 mcg total) by mouth daily before breakfast.   losartan (COZAAR) 25 MG tablet Take 0.5 tablets (12.5 mg total) by mouth daily.   metoprolol succinate (TOPROL-XL) 25 MG 24 hr tablet Take 1 tablet (25 mg total) by mouth daily.   OneTouch Delica Lancets 09N MISC Four times daily testing  dx e11.65   potassium chloride SA (KLOR-CON) 20 MEQ tablet Take 2 tablets (40 mEq total) by  mouth daily.   rosuvastatin (CRESTOR) 5 MG tablet Take 1 tablet (5 mg total) by mouth every evening.   spironolactone (ALDACTONE) 25 MG tablet Take 0.5 tablets (12.5 mg total) by mouth daily.   tamsulosin (FLOMAX) 0.4 MG CAPS capsule Take 1 capsule (0.4 mg total) by mouth daily after supper.   Vitamin D, Ergocalciferol, (DRISDOL) 1.25 MG (50000 UNIT) CAPS capsule Take 1 capsule (50,000 Units total) by mouth every 7 (seven) days.   No facility-administered encounter medications on file as of 09/30/2019.    PHYSICAL EXAM:   BP 140-82, HR 88, RR 28 Well nourished, pleasantly conversant; some dyspnea with conversation. Wife [name] in attendance Cardiovascular: regular rate and rhythm; summation gallop Pulmonary: lung fields clear Extremities: Bilateral LE edema, pitting to high thigh Neurological: Weakness but otherwise nonfocal  Julianne Handler, NP

## 2019-10-03 ENCOUNTER — Other Ambulatory Visit: Payer: Self-pay

## 2019-10-03 ENCOUNTER — Ambulatory Visit (INDEPENDENT_AMBULATORY_CARE_PROVIDER_SITE_OTHER): Payer: PPO | Admitting: Cardiology

## 2019-10-03 ENCOUNTER — Other Ambulatory Visit (HOSPITAL_COMMUNITY)
Admission: RE | Admit: 2019-10-03 | Discharge: 2019-10-03 | Disposition: A | Discharge status: Home / Self Care | Payer: PPO | Source: Ambulatory Visit | Attending: Cardiology | Admitting: Cardiology

## 2019-10-03 ENCOUNTER — Telehealth: Payer: Self-pay

## 2019-10-03 ENCOUNTER — Encounter: Payer: Self-pay | Admitting: Cardiology

## 2019-10-03 VITALS — BP 100/58 | HR 75 | Temp 98.6°F | Ht 69.0 in | Wt 178.0 lb

## 2019-10-03 DIAGNOSIS — S91301A Unspecified open wound, right foot, initial encounter: Secondary | ICD-10-CM | POA: Diagnosis not present

## 2019-10-03 DIAGNOSIS — Z79899 Other long term (current) drug therapy: Secondary | ICD-10-CM

## 2019-10-03 DIAGNOSIS — E1165 Type 2 diabetes mellitus with hyperglycemia: Secondary | ICD-10-CM

## 2019-10-03 DIAGNOSIS — S91309A Unspecified open wound, unspecified foot, initial encounter: Secondary | ICD-10-CM

## 2019-10-03 DIAGNOSIS — I513 Intracardiac thrombosis, not elsewhere classified: Secondary | ICD-10-CM

## 2019-10-03 DIAGNOSIS — I428 Other cardiomyopathies: Secondary | ICD-10-CM | POA: Diagnosis not present

## 2019-10-03 DIAGNOSIS — I10 Essential (primary) hypertension: Secondary | ICD-10-CM | POA: Diagnosis not present

## 2019-10-03 DIAGNOSIS — E039 Hypothyroidism, unspecified: Secondary | ICD-10-CM | POA: Diagnosis not present

## 2019-10-03 LAB — BASIC METABOLIC PANEL
Anion gap: 10 (ref 5–15)
BUN: 21 mg/dL (ref 8–23)
CO2: 29 mmol/L (ref 22–32)
Calcium: 9 mg/dL (ref 8.9–10.3)
Chloride: 96 mmol/L — ABNORMAL LOW (ref 98–111)
Creatinine, Ser: 1.1 mg/dL (ref 0.61–1.24)
GFR calc Af Amer: >60 mL/min (ref >60)
GFR calc non Af Amer: >60 mL/min (ref >60)
Glucose, Bld: 575 mg/dL (ref 70–99)
Potassium: 4.2 mmol/L (ref 3.5–5.1)
Sodium: 135 mmol/L (ref 135–145)

## 2019-10-03 LAB — TSH: TSH: 8.634 u[IU]/mL — ABNORMAL HIGH (ref 0.350–4.500)

## 2019-10-03 NOTE — Patient Instructions (Signed)
Medication Instructions:  Your physician recommends that you continue on your current medications as directed. Please refer to the Current Medication list given to you today.  *If you need a refill on your cardiac medications before your next appointment, please call your pharmacy*   Lab Work: BMET, TSH  If you have labs (blood work) drawn today and your tests are completely normal, you will receive your results only by: Marland Kitchen MyChart Message (if you have MyChart) OR . A paper copy in the mail If you have any lab test that is abnormal or we need to change your treatment, we will call you to review the results.   Testing/Procedures: Your physician has requested that you have a lower  extremity arterial duplex. This test is an ultrasound of the arteries in the legs or . It looks at arterial blood flow in the legs . Allow one hour for Lower  Arterial scans. There are no restrictions or special instructions   Follow-Up: At [facility], you and your health needs are our priority.  As part of our continuing mission to provide you with exceptional heart care, we have created designated Provider Care Teams.  These Care Teams include your primary Cardiologist (physician) and Advanced Practice Providers (APPs -  Physician Assistants and Nurse Practitioners) who all work together to provide you with the care you need, when you need it.  We recommend signing up for the patient portal called "MyChart".  Sign up information is provided on this After Visit Summary.  MyChart is used to connect with patients for Virtual Visits (Telemedicine).  Patients are able to view lab/test results, encounter notes, upcoming appointments, etc.  Non-urgent messages can be sent to your provider as well.   To learn more about what you can do with MyChart, go to [email or web address].    Your next appointment:   2 week(s)  The format for your next appointment:   In Person  Provider:   You may see one of the following  Advanced Practice Providers on your designated Care Team:    Randall An, PA-C   Jacolyn Reedy, [place]     Other Instructions NONE     Thank you for choosing [facility] !

## 2019-10-03 NOTE — Telephone Encounter (Signed)
[facility] Out -patient Lab calls with Critical Glucose: 575 mg/dl   L.Annie Paras, NP made aware.

## 2019-10-05 ENCOUNTER — Other Ambulatory Visit: Payer: Self-pay | Admitting: *Deleted

## 2019-10-05 ENCOUNTER — Other Ambulatory Visit: Payer: Self-pay | Admitting: Cardiology

## 2019-10-05 ENCOUNTER — Telehealth: Payer: Self-pay | Admitting: "Endocrinology

## 2019-10-05 DIAGNOSIS — S91309A Unspecified open wound, unspecified foot, initial encounter: Secondary | ICD-10-CM

## 2019-10-05 DIAGNOSIS — I5041 Acute combined systolic (congestive) and diastolic (congestive) heart failure: Secondary | ICD-10-CM

## 2019-10-05 NOTE — Telephone Encounter (Signed)
Tried to reach pt to schedule. No answer and VM full.

## 2019-10-07 ENCOUNTER — Other Ambulatory Visit: Payer: PPO | Admitting: Internal Medicine

## 2019-10-07 ENCOUNTER — Other Ambulatory Visit: Payer: Self-pay

## 2019-10-07 DIAGNOSIS — Z7189 Other specified counseling: Secondary | ICD-10-CM

## 2019-10-07 DIAGNOSIS — Z515 Encounter for palliative care: Secondary | ICD-10-CM | POA: Diagnosis not present

## 2019-10-07 NOTE — Progress Notes (Addendum)
April 9th, 2021 [facility] Palliative Care Consult Note Telephone: [phone]  Fax: [phone]  PATIENT NAME: Stanley Levy DOB: March 06, 1956 MRN: [id number] [address] [place] [id number] [phone] (Home Phone) [phone] (Mobile)  PRIMARY CARE PROVIDER:   Perlie Mayo, NP  REFERRING PROVIDER:  Perlie Mayo, NP [address] [place],  [place] [address]  RESPONSIBLE PARTY:   Nadara Mode [phone] [facility] Phone)   ASSESSMENT / RECOMMENDATIONS:  1. Advance Care Planning: A. Directives: Not discussed this visit; will do do next time. B. Goals of Care: Wishes to increased his ability to exercise, gain in strength. Looking forward to cardiac rehab to start on April 29th. He also wants to start lifting weights, which was a hobby of his when he was well.   2. Symptom Management: Patient is A & O x 3. He is independent in ADLs.   CHF: He feels so much better than a month ago, with diuresis of about 60 lbs of fluid. He has increase energy, no falls, ambulating without use of assistive devices. Drives. Continues with LE edema to mid calf but of course this is markedly improved form the weeping edema to high thigh. No longer problems with orthopnea. He doesn't have a home scale (plans to obtain this weekend) but weight last week was 176 lbs (from a high of 241 lbs on 09/13/19). At a height or 5'9" his BMI is 26kg/m2.  -when gets his scale, start following and recording daily weights to share with his PCP.  Diabetes 2: Patient has been trying to limit the sweets in his diet; told okay to have occasional sweets "in moderation". He has a new lancet device that is easier for him to use, and he's been checking his sugars regularly; last eve was 335. He is not on a sliding scale, but on bid Novolog pen.   Noted that the office of Dr. Dorris Fetch [facility] [phone]) had attempted to contact patient for f/u. Mr. Dooling cell phone is  not activated. I gave Aydden the number for the Endocrinologist, and Sergey plans to call on Monday and set up the apt. I'll send a message to the office as well. .   Diabetic Neuropathy: C/o foot burning and numbness exacerbated by ambulation. He has Neurontin ordered; we found the pill bottle in the bedroom, and spouse [name] knows to dispense tid. Will see if needs future titration.  Home Management: Patient's spouse [name] looks cachectic. She states her weight is 89 lbs; unknown etiology why weight loss.  We went through patient's pills and medication list. [name] dispenses his medications; she was using a pill box before but now doesn't want to use, preferring to just dispense form the bottle. We were missing his Aldactone 25 mg 1/2 qd. I ordered this form the pharmacy and they will pick up this eve.   -consider having pharmacy do a blister pack for pill dispensing; will check if possible for pharmacy to do, and if [name] would be in agreement.   2. Family Supports: Lives with wife [name]; married for 30 yrs. Patient is her caregiver (she has had strokes). 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 Patient enjoys lifting weights, yard work, watching TV, reading. Retired from working at [employer], after getting sick early Feb.  [name] has a twin sister [name] who drives patient to appointments. Patient and spouse would really love to move to another home or apartment. They currently have no running water; bathroom is undergoing renovation  and project is not due to be completed for a few more months. They have a financial planner who helps them manage their expenses.   3. Follow up Palliative Care Visit: on a monthly basis; call day or two before visits to confirm.   I spent 60 minutes providing this consultation from 3:30pm to 4:30pm. More than 50% of the time in this consultation was spent coordinating communication.   HISTORY OF PRESENT ILLNESS:  Stanley Levy is a 64 y.o. male with h/o CHF (systolic), non-ischemic  cardiomyopathy (EF 16% cath 09/20/19) intracardiac thrombosis (Eliquis), DM2, hypothyroidism, frequent falls, cognitive/memory challenges, hyperlipidemia, and anxiety. -2/11-1/05/2020 ER hyperglycemia glucose 562, A1C 18% -2/15-2/23/2021: Hospitalized with acute on chronic syst/diastolic CHF; (Echo 2/77/4128) EF 20%. Left ventricular mural thrombus. -3/16-3/25/2021 Hospitalized with acute on chronic combined systolic/diastolic CHF. Cath: mod pulm HTN, nl coronary arteries. EF 30%/global hypokinesis. LVED pressure severely elevated. This is a f/u Palliative Care visit from 09/06/2019.  CODE STATUS: full code  PPS: 60%  HOSPICE ELIGIBILITY/DIAGNOSIS: TBD  PAST MEDICAL HISTORY:  Past Medical History:  Diagnosis Date  . Anxiety   . CHF (congestive heart failure) (HCC)    a. EF 20% with global HK by echo in 08/2019. Also noted to have LV mural thrombus.   . Diabetes mellitus, type II ([place])   . Hyperlipidemia   . Thyroid disease     SOCIAL HX:  Social History   Tobacco Use  . Smoking status: Never Smoker  . Smokeless tobacco: Never Used  Substance Use Topics  . Alcohol use: No    ALLERGIES: No Known Allergies   PERTINENT MEDICATIONS:  Outpatient Encounter Medications as of 10/07/2019  Medication Sig  . acetaminophen (TYLENOL) 325 MG tablet Take 2 tablets (650 mg total) by mouth every 4 (four) hours as needed for headache or mild pain.  Marland Kitchen apixaban (ELIQUIS) 5 MG TABS tablet Take 1 tablet (5 mg total) by mouth 2 (two) times daily.  . blood glucose meter kit and supplies KIT Dispense based on patient and insurance preference. Use up to four times daily as directed. (FOR ICD-9 250.00, 250.01).  . blood glucose meter kit and supplies Dispense based on patient and insurance preference. Use up to four times daily as directed. (FOR ICD-10 E10.9, E11.9).  Marland Kitchen digoxin (LANOXIN) 0.125 MG tablet Take 1 tablet (0.125 mg total) by mouth daily.  . furosemide (LASIX) 40 MG tablet Take 1 tablet (40  mg total) by mouth 2 (two) times daily.  Marland Kitchen gabapentin (NEURONTIN) 100 MG capsule Take 1 capsule (100 mg total) by mouth 3 (three) times daily.  Marland Kitchen glucose blood (GLUCOSE METER TEST) test strip Use as instructed  . insulin aspart protamine - aspart (NOVOLOG MIX 70/30 FLEXPEN) (70-30) 100 UNIT/ML FlexPen Inject 0.1 mLs (10 Units total) into the skin 2 (two) times daily with a meal. Start tomorrow morning - 09/23/19  . Insulin Pen Needle 31G X 5 MM MISC 1 Units by Does not apply route 2 (two) times daily.  . Lancets 30G MISC Check BG twice daily, once before breakfast and once before supper.  . levothyroxine (SYNTHROID) 75 MCG tablet Take 0.5 tablets (37.5 mcg total) by mouth daily before breakfast.  . losartan (COZAAR) 25 MG tablet Take 0.5 tablets (12.5 mg total) by mouth daily.  . metoprolol succinate (TOPROL-XL) 25 MG 24 hr tablet Take 1 tablet (25 mg total) by mouth daily.  Glory Rosebush Delica Lancets 78M MISC Four times daily testing  dx e11.65  . potassium  chloride SA (KLOR-CON) 20 MEQ tablet Take 2 tablets (40 mEq total) by mouth daily.  . rosuvastatin (CRESTOR) 5 MG tablet Take 1 tablet (5 mg total) by mouth every evening.  Marland Kitchen spironolactone (ALDACTONE) 25 MG tablet Take 0.5 tablets (12.5 mg total) by mouth daily.  . tamsulosin (FLOMAX) 0.4 MG CAPS capsule Take 1 capsule (0.4 mg total) by mouth daily after supper.  . Vitamin D, Ergocalciferol, (DRISDOL) 1.25 MG (50000 UNIT) CAPS capsule Take 1 capsule (50,000 Units total) by mouth every 7 (seven) days.   No facility-administered encounter medications on file as of 10/07/2019.    PHYSICAL EXAM:   BP 140-82, HR 88, RR 28 Well nourished, pleasantly conversant, A & O x 3. Wife [name] in attendance Cardiovascular: regular rate and rhythm; summation gallop Pulmonary: lung fields clear Extremities: Bilateral LE edema, mildly pitting to mid calf Neurological:  nonfocal  Julianne Handler, NP

## 2019-10-08 ENCOUNTER — Encounter: Payer: Self-pay | Admitting: Internal Medicine

## 2019-10-12 ENCOUNTER — Ambulatory Visit (INDEPENDENT_AMBULATORY_CARE_PROVIDER_SITE_OTHER): Payer: PPO | Admitting: Family Medicine

## 2019-10-12 ENCOUNTER — Ambulatory Visit: Payer: PPO | Admitting: Nutrition

## 2019-10-12 ENCOUNTER — Other Ambulatory Visit: Payer: Self-pay

## 2019-10-12 ENCOUNTER — Encounter: Payer: Self-pay | Admitting: Family Medicine

## 2019-10-12 VITALS — BP 104/66 | HR 69 | Temp 97.2°F | Resp 16 | Ht 69.0 in | Wt 177.0 lb

## 2019-10-12 DIAGNOSIS — I5042 Chronic combined systolic (congestive) and diastolic (congestive) heart failure: Secondary | ICD-10-CM | POA: Diagnosis not present

## 2019-10-12 DIAGNOSIS — E1165 Type 2 diabetes mellitus with hyperglycemia: Secondary | ICD-10-CM | POA: Diagnosis not present

## 2019-10-12 LAB — GLUCOSE, POCT (MANUAL RESULT ENTRY): POC Glucose: 196 mg/dl — AB (ref 70–99)

## 2019-10-12 NOTE — Assessment & Plan Note (Signed)
Still not well controlled, thankfully is being seen by Endo with Dr Fransico Him now and hopefully will see improvement in A1c in the coming months. As he is learning to manage this better over time. He still needs to work on diet control as per wife he still snacks a lot.  Continue all medications and following Dr Dennie Bible lead on this.

## 2019-10-12 NOTE — Patient Instructions (Signed)
I appreciate the opportunity to provide you with care for your health and wellness. Today we discussed: overall health   Follow up: 3 months  No labs or referrals today  Continue all medications.  You look great! So glad your health has improved! I hope you and [name] keep good health.  Please continue to practice social distancing to keep you, your family, and our community safe.  If you must go out, please wear a mask and practice good handwashing.  It was a pleasure to see you and I look forward to continuing to work together on your health and well-being. Please do not hesitate to call the office if you need care or have questions about your care.  Have a wonderful day and week. With Gratitude, Tereasa Coop, DNP, AGNP-BC

## 2019-10-12 NOTE — Progress Notes (Signed)
Subjective:  Patient ID: Stanley Levy, male    DOB: December 11, 1955  Age: 64 y.o. MRN: [id number]  CC:  Chief Complaint  Patient presents with  . Diabetes    follow up visit       HPI  HPI  [name] is a 64 year old male patient of mine.  Who presents today for follow-up for chronic conditions. Thankfully he has been more consistent in taking his medications, on a regular basis over the last 30 days. Additionally, he has palliative care coming out to the home which is helping navigate and correct any problems with medications or concerns.  Wife is very active in his care as well-though today she appears not well and withdrawn. She usually will come into the room and help provide a history, however today she sat in the waiting area.  Appears to have some complaints of diabetic neuropathy.  Is on Neurontin for this.  He denies having any issues or concerns today outside of the bottom of his feet hurting as he has some sores on secondary to pressure, uncontrolled diabetes.  He reports that he talked to other doctors about this and they told him that it would take time for them to heal.  He is being managed now by specialist. Congestive heart failure: He feels much improved over the last month as he is diuresed about 60 pounds of fluid off.  He reports increased energy, no falls, ambulating without the use of assistive devices.  He reports driving himself here today.  Still has some lower leg edema but has greatly improved and is no longer weeping.  He denies have any problems with shortness of breath or trouble laying flat.  He still does not have a home scale but plans to get this over the weekend.  As he reported to the palliative care NP as well.  Diabetes: Has been seen by Dr. Dorris Fetch.  He is working on limiting sweets.  He has been using his lancet device that is easier for him to manipulate.  Checking his blood sugars more consistently.  He has not on this sliding scale but is on twice  daily NovoLog pen.  He still needs to get better control over his eating habits to help prevent his blood sugar for spike.  I did speak to his wife outside of the room about this she reports that he has a bad snacking.  And will eat poorly and then his blood sugars will wrap up.  But he has been trying to do better about this but he does have a difficult time with this.  Palliative CARE/home management: NP went through medications with [name] and patient.  His wife Stanley Levy dispenses the medications to him.  She was using a pillbox but no longer does that just prefers taking about a bottle.  They were missing his Aldactone 25 mg, this was ordered from the pharmacy and they report they will pick it up  Currently they have no running water in their home.  They have a bathroom undergoing renovation and the project is not due to be completed for several more months.  They would like to move to another [name] herself has recently been diagnosed with diabetes and has lost a great deal of weight over the last she has a twin sister who is actively involved in trying to make sure that they get to the appointments and providing them with anything that they might need.  Today patient denies signs and  symptoms of COVID 19 infection including fever, chills, cough, shortness of breath, and headache. Past Medical, Surgical, Social History, Allergies, and Medications have been Reviewed.   Past Medical History:  Diagnosis Date  . Acute on chronic combined systolic and diastolic CHF (congestive heart failure) ([place]) 09/13/2019  . Acute systolic heart failure ([place])   . Anxiety   . CHF (congestive heart failure) (HCC)    a. EF 20% with global HK by echo in 08/2019. Also noted to have LV mural thrombus.   . Diabetes mellitus, type II ([place])   . Hyperlipidemia   . Subacute confusional state 08/01/2019  . Thyroid disease     Current Meds  Medication Sig  . acetaminophen (TYLENOL) 325 MG tablet Take 2 tablets (650 mg total)  by mouth every 4 (four) hours as needed for headache or mild pain.  Marland Kitchen apixaban (ELIQUIS) 5 MG TABS tablet Take 1 tablet (5 mg total) by mouth 2 (two) times daily.  . blood glucose meter kit and supplies Dispense based on patient and insurance preference. Use up to four times daily as directed. (FOR ICD-10 E10.9, E11.9).  Marland Kitchen digoxin (LANOXIN) 0.125 MG tablet Take 1 tablet (0.125 mg total) by mouth daily.  . furosemide (LASIX) 40 MG tablet Take 1 tablet (40 mg total) by mouth 2 (two) times daily.  Marland Kitchen gabapentin (NEURONTIN) 100 MG capsule Take 1 capsule (100 mg total) by mouth 3 (three) times daily.  Marland Kitchen glucose blood (GLUCOSE METER TEST) test strip Use as instructed  . insulin aspart protamine - aspart (NOVOLOG MIX 70/30 FLEXPEN) (70-30) 100 UNIT/ML FlexPen Inject 0.1 mLs (10 Units total) into the skin 2 (two) times daily with a meal. Start tomorrow morning - 09/23/19  . Insulin Pen Needle 31G X 5 MM MISC 1 Units by Does not apply route 2 (two) times daily.  . Lancets 30G MISC Check BG twice daily, once before breakfast and once before supper.  . levothyroxine (SYNTHROID) 75 MCG tablet Take 0.5 tablets (37.5 mcg total) by mouth daily before breakfast.  . losartan (COZAAR) 25 MG tablet Take 0.5 tablets (12.5 mg total) by mouth daily.  . metoprolol succinate (TOPROL-XL) 25 MG 24 hr tablet Take 1 tablet (25 mg total) by mouth daily.  Glory Rosebush Delica Lancets 47M MISC Four times daily testing  dx e11.65  . potassium chloride SA (KLOR-CON) 20 MEQ tablet Take 2 tablets (40 mEq total) by mouth daily.  . rosuvastatin (CRESTOR) 5 MG tablet Take 1 tablet (5 mg total) by mouth every evening.  Marland Kitchen spironolactone (ALDACTONE) 25 MG tablet Take 0.5 tablets (12.5 mg total) by mouth daily.  . tamsulosin (FLOMAX) 0.4 MG CAPS capsule Take 1 capsule (0.4 mg total) by mouth daily after supper.  . Vitamin D, Ergocalciferol, (DRISDOL) 1.25 MG (50000 UNIT) CAPS capsule Take 1 capsule (50,000 Units total) by mouth every 7 (seven)  days.    ROS:  Review of Systems  Constitutional: Negative.   HENT: Negative.   Eyes: Negative.   Respiratory: Negative.   Cardiovascular: Negative.   Gastrointestinal: Negative.   Genitourinary: Negative.   Musculoskeletal: Negative.        Bottom of foot/heel pain  Skin: Negative.   Neurological: Negative.   Endo/Heme/Allergies: Negative.   Psychiatric/Behavioral: Negative.   All other systems reviewed and are negative.    Objective:   Today's Vitals: BP 104/66   Pulse 69   Temp (!) 97.2 F (36.2 C) (Temporal)   Resp 16   Ht 5' 9" (  1.753 m)   Wt 177 lb (80.3 kg)   SpO2 98%   BMI 26.14 kg/m  Vitals with BMI 10/12/2019 10/08/2019 10/03/2019  Height 5' 9" - 5' 9"  Weight 177 lbs 176 lbs 178 lbs  BMI 26.13 07.86 75.44  Systolic 920 - 100  Diastolic 66 - 58  Pulse 69 - 75  Some encounter information is confidential and restricted. Go to Review Flowsheets activity to see all data.     Physical Exam Vitals and nursing note reviewed.  Constitutional:      Appearance: Normal appearance. He is well-developed and well-groomed. He is obese.  HENT:     Head: Normocephalic and atraumatic.     Right Ear: External ear normal.     Left Ear: External ear normal.     Mouth/Throat:     Comments: Mask in place  Eyes:     General:        Right eye: No discharge.        Left eye: No discharge.     Conjunctiva/sclera: Conjunctivae normal.  Cardiovascular:     Rate and Rhythm: Normal rate and regular rhythm.     Pulses: Normal pulses.     Heart sounds: Normal heart sounds.  Pulmonary:     Effort: Pulmonary effort is normal.     Breath sounds: Normal breath sounds.  Musculoskeletal:        General: Normal range of motion.     Cervical back: Normal range of motion and neck supple.     Right lower leg: 1+ Pitting Edema present.     Left lower leg: 1+ Pitting Edema present.  Skin:    General: Skin is warm.  Neurological:     General: No focal deficit present.     Mental  Status: He is alert and oriented to person, place, and time.  Psychiatric:        Attention and Perception: Attention normal.        Mood and Affect: Mood normal.        Speech: Speech normal.        Behavior: Behavior normal. Behavior is cooperative.        Thought Content: Thought content normal.        Cognition and Memory: Cognition normal.        Judgment: Judgment normal.     Assessment   1. Chronic combined systolic and diastolic congestive heart failure ([place])   2. Uncontrolled type 2 diabetes mellitus with hyperglycemia ([place])     Tests ordered Orders Placed This Encounter  Procedures  . POCT glucose (manual entry)     Plan: Please see assessment and plan per problem list above.   No orders of the defined types were placed in this encounter.   Patient to follow-up in 3 months   Perlie Mayo, NP

## 2019-10-12 NOTE — Assessment & Plan Note (Signed)
Is now thankfully reduced the fluid on his body over the last several weeks, nearly 60 pounds. Is seeing the cardiologist and hopefully will improve his overall EF with on going care. Continue all medications at this time.

## 2019-10-13 ENCOUNTER — Ambulatory Visit: Payer: PPO | Admitting: Nutrition

## 2019-10-25 NOTE — Progress Notes (Deleted)
Cardiology Office Note    Date:  10/25/2019   ID:  Stanley Levy [birth year], MRN [id number]  PCP:  Freddy Finner, NP  Cardiologist: Prentice Docker, MD EPS: None  No chief complaint on file.   History of Present Illness:  Stanley Levy is a 64 y.o. male with history of hypertension, HLD, DM type II, medical noncompliance, history of CVA.  Was diagnosed with a new cardiomyopathy EF of 20% and large LV mural thrombus 08/2019 started on Eliquis.  Did not consider Coumadin because of noncompliance.  Readmitted 09/13/2019 with acute heart failure and underwent left and right heart cath normal coronary arteries, severe LV dysfunction in severity only elevated LV EDP  Patient was last seen in the office 10/03/2019 at which time his blood pressures were soft.  Glucose was high at 575 and he had not taken his insulin that day.  A1c was 18.5 TSH had improved from 15.9 down to 8 back on Synthroid.  Past Medical History:  Diagnosis Date  . Acute on chronic combined systolic and diastolic CHF (congestive heart failure) (HCC) 09/13/2019  . Acute systolic heart failure (HCC)   . Anxiety   . CHF (congestive heart failure) (HCC)    a. EF 20% with global HK by echo in 08/2019. Also noted to have LV mural thrombus.   . Diabetes mellitus, type II (HCC)   . Hyperlipidemia   . Subacute confusional state 08/01/2019  . Thyroid disease     Past Surgical History:  Procedure Laterality Date  . FOOT SURGERY    . RIGHT/LEFT HEART CATH AND CORONARY ANGIOGRAPHY N/A 09/20/2019   Procedure: RIGHT/LEFT HEART CATH AND CORONARY ANGIOGRAPHY;  Surgeon: Marykay Lex, MD;  Location: [facility] INVASIVE CV LAB;  Service: Cardiovascular;  Laterality: N/A;    Current Medications: No outpatient medications have been marked as taking for the 10/31/19 encounter (Appointment) with Dyann Kief, PA-C.     Allergies:   Patient has no known allergies.   Social History   Socioeconomic History  . Marital status:  Married    Spouse name: Stanley Levy   . Number of children: Not on file  . Years of education: Not on file  . Highest education level: High school graduate  Occupational History  . Not on file  Tobacco Use  . Smoking status: Never Smoker  . Smokeless tobacco: Never Used  Substance and Sexual Activity  . Alcohol use: No  . Drug use: No  . Sexual activity: Yes  Other Topics Concern  . Not on file  Social History Narrative   Recent left working at [employer] after getting sick in start of February      Lives with wife Stanley Levy- married 30 (he is her caregiver, she has had strokes)    No children      Beagle: Stanley Levy       Enjoys: weight lift, yard work, Scientist, physiological, read-       Diet: increasing veggies, was drinking chocolate milk, root beer, and sweet tea, fast food   Caffeine: sweet tea-3 cups of more daily    Water: 8 cups daily       Wears seat belt    Smoke detectors    Does not use phone while driving    Social Determinants of Health   Financial Resource Strain: Low Risk   . Difficulty of Paying Living Expenses: Not hard at all  Food Insecurity: No Food Insecurity  . Worried About Radiation protection practitioner  of Food in the Last Year: Never true  . Ran Out of Food in the Last Year: Never true  Transportation Needs: No Transportation Needs  . Lack of Transportation (Medical): No  . Lack of Transportation (Non-Medical): No  Physical Activity: Inactive  . Days of Exercise per Week: 0 days  . Minutes of Exercise per Session: 0 min  Stress: No Stress Concern Present  . Feeling of Stress : Not at all  Social Connections: Slightly Isolated  . Frequency of Communication with Friends and Family: Twice a week  . Frequency of Social Gatherings with Friends and Family: Twice a week  . Attends Religious Services: 1 to 4 times per year  . Active Member of Clubs or Organizations: No  . Attends Banker Meetings: Never  . Marital Status: Married     Family History:  The patient's ***family  history includes Heart attack in his father.   ROS:   Please see the history of present illness.    ROS All other systems reviewed and are negative.   PHYSICAL EXAM:   VS:  There were no vitals taken for this visit.  Physical Exam  GEN: Well nourished, well developed, in no acute distress  HEENT: normal  Neck: no JVD, carotid bruits, or masses Cardiac:RRR; no murmurs, rubs, or gallops  Respiratory:  clear to auscultation bilaterally, normal work of breathing GI: soft, nontender, nondistended, + BS Ext: without cyanosis, clubbing, or edema, Good distal pulses bilaterally MS: no deformity or atrophy  Skin: warm and dry, no rash Neuro:  Alert and Oriented x 3, Strength and sensation are intact Psych: euthymic mood, full affect  Wt Readings from Last 3 Encounters:  10/12/19 177 lb (80.3 kg)  10/08/19 176 lb (79.8 kg)  10/03/19 178 lb (80.7 kg)      Studies/Labs Reviewed:   EKG:  EKG is*** ordered today.  The ekg ordered today demonstrates ***  Recent Labs: 09/13/2019: ALT 41; B Natriuretic Peptide 1,082.0 09/19/2019: Magnesium 1.9 09/21/2019: Hemoglobin 12.8; Platelets 126 10/03/2019: BUN 21; Creatinine, Ser 1.10; Potassium 4.2; Sodium 135; TSH 8.634   Lipid Panel    Component Value Date/Time   CHOL 168 08/11/2019 0859   TRIG 185 (H) 08/11/2019 0859   HDL 56 08/11/2019 0859   CHOLHDL 3.0 08/11/2019 0859   LDLCALC 84 08/11/2019 0859    Additional studies/ records that were reviewed today include:    Cardiac cath 09/20/19 CARDIAC CATHETERIZATION   Result Date: 09/20/2019  Hemodynamic findings consistent with moderate pulmonary hypertension.  LV end diastolic pressure is severely elevated.  Angiographically normal coronary arteries; large draping vessels. Left dominant system  SUMMARY  Nonischemic cardiomyopathy: Angiographically normal coronary arteries with Left dominant system  Severe combined systolic and diastolic cardiomyopathy: EF severely reduced by EF, PCWP of  30 with LVEDP of 36. RECOMMENDATIONS  Return to nursing unit for ongoing care.  We will increase Lasix to 80 mg twice daily.  80 mg given in Cath Lab.  Continue to titrate guideline directed medical management for nonischemic cardiomyopathy. [name], MD    Echo 09/20/19 Echo 08/16/19 1. Left ventricular ejection fraction, by estimation, is 20%. The left  ventricle has severely decreased function. The left ventricle demonstrates  global hypokinesis with regional variation. Reviewed hospital notes and labs, cardiac cath.   2. Large LV mural thrombus in distal anterolateral position,  approximately 2.0 x 2.5 cm (trabecular muscle obscures extent to some  degree). Reported to Dr. Mariea Clonts.   3. Right ventricular  systolic function is moderately reduced. The right  ventricular size is normal. There is mildly elevated pulmonary artery  systolic pressure. The estimated right ventricular systolic pressure is  78.5 mmHg.   4. Left atrial size was mildly dilated.   5. Right atrial size was moderately dilated.   6. The mitral valve is grossly normal. Trivial mitral valve  regurgitation.   7. The aortic valve is tricuspid. Aortic valve regurgitation is not  visualized.   8. The inferior vena cava is normal in size with greater than 50%  respiratory variability, suggesting right atrial pressure of 3 mmHg.      ASSESSMENT:    1. NICM (nonischemic cardiomyopathy) ([place])   2. LV (left ventricular) mural thrombus   3. Essential hypertension   4. Poorly controlled type 2 diabetes mellitus ([place])   5. Hypothyroidism, unspecified type   6. History of CVA (cerebrovascular accident)   7. Noncompliance      PLAN:  In order of problems listed above:  NICM LVEF 20% on BB, digoxin, spironolactone, losartan  LV thrombus on eliquis  HTN  DM poorly controlled A1C 18.5  Hypothyroid TSH improved 15 down to 8  History of CVA  History of noncompliance-home health going out to see  patient.    Medication Adjustments/Labs and Tests Ordered: Current medicines are reviewed at length with the patient today.  Concerns regarding medicines are outlined above.  Medication changes, Labs and Tests ordered today are listed in the Patient Instructions below. There are no Patient Instructions on file for this visit.   Signed, Ermalinda Barrios, PA-C  10/25/2019 3:52 PM    [facility] [place], [place], [place]  [address] Phone: [phone]; Fax: [phone]

## 2019-10-27 ENCOUNTER — Encounter (HOSPITAL_COMMUNITY): Payer: PPO

## 2019-10-31 ENCOUNTER — Ambulatory Visit: Payer: PPO | Admitting: Physician Assistant

## 2019-11-04 ENCOUNTER — Other Ambulatory Visit: Payer: Self-pay

## 2019-11-04 ENCOUNTER — Other Ambulatory Visit: Payer: PPO | Admitting: Internal Medicine

## 2019-11-14 ENCOUNTER — Other Ambulatory Visit: Payer: Self-pay

## 2019-11-14 ENCOUNTER — Ambulatory Visit (INDEPENDENT_AMBULATORY_CARE_PROVIDER_SITE_OTHER): Payer: PPO

## 2019-11-14 DIAGNOSIS — S91309A Unspecified open wound, unspecified foot, initial encounter: Secondary | ICD-10-CM | POA: Diagnosis not present

## 2019-11-29 ENCOUNTER — Encounter: Payer: Self-pay | Admitting: *Deleted

## 2019-12-01 ENCOUNTER — Telehealth: Payer: Self-pay | Admitting: Internal Medicine

## 2019-12-01 NOTE — Telephone Encounter (Signed)
12/01/2019 4pm: Tried to contact patient and spouse on home and mobile phone to set up f/u home Palliative Care visit. No answer; unable to leave message.  Holly Bodily NP-C [phone]

## 2019-12-08 ENCOUNTER — Telehealth: Payer: Self-pay | Admitting: Cardiology

## 2019-12-08 NOTE — Telephone Encounter (Signed)
Patient calling for test results. °

## 2019-12-08 NOTE — Telephone Encounter (Signed)
Reviewed results of LEA with patient who verbalized understanding. I also scheduled his overdue office visit appointment with Dr. Purvis Sheffield. He thanked me for the call.

## 2019-12-20 ENCOUNTER — Telehealth: Payer: Self-pay

## 2019-12-20 NOTE — Telephone Encounter (Signed)
-----   Message from Laqueta Linden, MD sent at 12/20/2019  3:41 PM EDT ----- Marcha Solders, if you put in order for CR, I will sign. ----- Message ----- From: Leone Brand, NP Sent: 12/05/2019  10:16 AM EDT To: Laqueta Linden, MD  Pt wanted to do cardiac rehab, could you place an order for this?  They do not accept from [facility] thanks.  Thanks.  ----- Message ----- From: Chelsea Aus, RN Sent: 10/04/2019   2:13 PM EDT To: Leone Brand, NP  Vernona Rieger, Received copy of your office note from yesterday by fax today.  This is a pt who was not seen by phase I inpatient - wasn't ordered nor phase II upon discharge.  Will send the fax to [facility] for follow up since he lives in [place]. Will need referral placed by Dr. Purvis Sheffield and much improved diabetes management - cut off for exercise is cbg 300.  Thanks Pharmacist, hospital, BSN Cardiac and Emergency planning/management officer

## 2019-12-20 NOTE — Telephone Encounter (Signed)
Ref to cardiac rehab

## 2019-12-22 LAB — HM DIABETES EYE EXAM

## 2019-12-27 DIAGNOSIS — E113299 Type 2 diabetes mellitus with mild nonproliferative diabetic retinopathy without macular edema, unspecified eye: Secondary | ICD-10-CM

## 2019-12-27 HISTORY — DX: Type 2 diabetes mellitus with mild nonproliferative diabetic retinopathy without macular edema, unspecified eye: E11.3299

## 2020-01-10 ENCOUNTER — Ambulatory Visit: Payer: PPO | Admitting: Cardiovascular Disease

## 2020-01-12 ENCOUNTER — Telehealth (INDEPENDENT_AMBULATORY_CARE_PROVIDER_SITE_OTHER): Payer: PPO | Admitting: Family Medicine

## 2020-01-12 ENCOUNTER — Other Ambulatory Visit: Payer: Self-pay

## 2020-01-12 ENCOUNTER — Encounter: Payer: Self-pay | Admitting: Family Medicine

## 2020-01-12 ENCOUNTER — Ambulatory Visit (INDEPENDENT_AMBULATORY_CARE_PROVIDER_SITE_OTHER): Payer: PPO | Admitting: "Endocrinology

## 2020-01-12 ENCOUNTER — Encounter: Payer: Self-pay | Admitting: "Endocrinology

## 2020-01-12 VITALS — BP 116/68 | HR 69 | Temp 97.1°F | Resp 18 | Ht 69.0 in | Wt 188.0 lb

## 2020-01-12 VITALS — BP 131/77 | HR 66 | Ht 69.0 in | Wt 187.8 lb

## 2020-01-12 DIAGNOSIS — E1165 Type 2 diabetes mellitus with hyperglycemia: Secondary | ICD-10-CM | POA: Diagnosis not present

## 2020-01-12 DIAGNOSIS — E782 Mixed hyperlipidemia: Secondary | ICD-10-CM | POA: Diagnosis not present

## 2020-01-12 DIAGNOSIS — E039 Hypothyroidism, unspecified: Secondary | ICD-10-CM

## 2020-01-12 DIAGNOSIS — I10 Essential (primary) hypertension: Secondary | ICD-10-CM

## 2020-01-12 LAB — POCT GLYCOSYLATED HEMOGLOBIN (HGB A1C)
HbA1c POC (<> result, manual entry): 12.9 % (ref 4.0–5.6)
HbA1c, POC (controlled diabetic range): 12.9 % — AB (ref 0.0–7.0)
HbA1c, POC (prediabetic range): 12.9 % — AB (ref 5.7–6.4)
Hemoglobin A1C: 12.9 % — AB (ref 4.0–5.6)

## 2020-01-12 MED ORDER — NOVOLOG MIX 70/30 FLEXPEN (70-30) 100 UNIT/ML ~~LOC~~ SUPN: 20.0000 [IU] | PEN_INJECTOR | Freq: Two times a day (BID) | SUBCUTANEOUS | 2 refills | Status: DC

## 2020-01-12 NOTE — Patient Instructions (Signed)

## 2020-01-12 NOTE — Progress Notes (Signed)
Subjective:  Patient ID: Stanley Levy, male    DOB: May 03, 1956  Age: 64 y.o. MRN: [id number]  CC:  Chief Complaint  Patient presents with  . Follow-up    3 month follow up does have some tingling in his feet that comes and goes hasnt tried anything for this was prescribed imipramine HCL 50 mg awhile back by [facility] but does not have anymore and would like to know if you would refill this       HPI  HPI   Stanley Levy is a 64 year old male patient of mine who presents today to have follow-up on his diabetes.  He reports he is taking his medications as directed.  But denies that he has checked his blood sugar today.  He does not know his range of blood sugars at this time.  He is due for a repeat A1c check.  Weight has improved from previous visits.  He has lost a good amount of weight over the early part of the spring secondary to uncontrolled diabetes and thyroid.  Blood pressure is stable today.  He denies having any signs or symptoms of uncontrolled diabetes reports that he is trying to eat very well.  He reports he walks several miles a day.  He reports he needs new strips.  He denies having any chest pain, shortness of breath, cough, fever, chills, headaches, dizziness, vision changes.  Reports that he is sleeping well.  Reports that he is eating well.  Denies changes in bowel or bladder habits.  Today patient denies signs and symptoms of COVID 19 infection including fever, chills, cough, shortness of breath, and headache. Past Medical, Surgical, Social History, Allergies, and Medications have been Reviewed.   Past Medical History:  Diagnosis Date  . Acute on chronic combined systolic and diastolic CHF (congestive heart failure) ([place]) 09/13/2019  . Acute systolic heart failure ([place])   . Anxiety   . CHF (congestive heart failure) (HCC)    a. EF 20% with global HK by echo in 08/2019. Also noted to have LV mural thrombus.   . Diabetes mellitus, type II ([place])   . Hyperlipidemia   .  Nonproliferative diabetic retinopathy ([place]) 12/27/2019  . Subacute confusional state 08/01/2019  . Thyroid disease     Current Meds  Medication Sig  . acetaminophen (TYLENOL) 325 MG tablet Take 2 tablets (650 mg total) by mouth every 4 (four) hours as needed for headache or mild pain.  Marland Kitchen apixaban (ELIQUIS) 5 MG TABS tablet Take 1 tablet (5 mg total) by mouth 2 (two) times daily.  . blood glucose meter kit and supplies Dispense based on patient and insurance preference. Use up to four times daily as directed. (FOR ICD-10 E10.9, E11.9).  Marland Kitchen digoxin (LANOXIN) 0.125 MG tablet Take 1 tablet (0.125 mg total) by mouth daily.  . furosemide (LASIX) 40 MG tablet Take 1 tablet (40 mg total) by mouth 2 (two) times daily.  Marland Kitchen gabapentin (NEURONTIN) 100 MG capsule Take 1 capsule (100 mg total) by mouth 3 (three) times daily.  Marland Kitchen glucose blood (GLUCOSE METER TEST) test strip Use as instructed  . Insulin Pen Needle 31G X 5 MM MISC 1 Units by Does not apply route 2 (two) times daily.  . Lancets 30G MISC Check BG twice daily, once before breakfast and once before supper.  . levothyroxine (SYNTHROID) 75 MCG tablet Take 0.5 tablets (37.5 mcg total) by mouth daily before breakfast.  . losartan (COZAAR) 25 MG tablet Take 0.5  tablets (12.5 mg total) by mouth daily.  . metoprolol succinate (TOPROL-XL) 25 MG 24 hr tablet Take 1 tablet (25 mg total) by mouth daily.  Glory Rosebush Delica Lancets 17O MISC Four times daily testing  dx e11.65  . potassium chloride SA (KLOR-CON) 20 MEQ tablet Take 2 tablets (40 mEq total) by mouth daily.  . rosuvastatin (CRESTOR) 5 MG tablet Take 1 tablet (5 mg total) by mouth every evening.  Marland Kitchen spironolactone (ALDACTONE) 25 MG tablet Take 0.5 tablets (12.5 mg total) by mouth daily.  . tamsulosin (FLOMAX) 0.4 MG CAPS capsule Take 1 capsule (0.4 mg total) by mouth daily after supper.  . Vitamin D, Ergocalciferol, (DRISDOL) 1.25 MG (50000 UNIT) CAPS capsule Take 1 capsule (50,000 Units total) by mouth  every 7 (seven) days.  . [DISCONTINUED] insulin aspart protamine - aspart (NOVOLOG MIX 70/30 FLEXPEN) (70-30) 100 UNIT/ML FlexPen Inject 0.1 mLs (10 Units total) into the skin 2 (two) times daily with a meal. Start tomorrow morning - 09/23/19    ROS:  Review of Systems  Constitutional: Negative.   HENT: Negative.   Eyes: Negative.   Respiratory: Negative.   Cardiovascular: Negative.   Gastrointestinal: Negative.   Genitourinary: Negative.   Musculoskeletal: Negative.   Skin: Negative.   Neurological: Negative.   Endo/Heme/Allergies: Negative.   Psychiatric/Behavioral: Negative.   All other systems reviewed and are negative.    Objective:   Today's Vitals: BP 116/68 (BP Location: Right Arm, Patient Position: Sitting, Cuff Size: Normal)   Pulse 69   Temp (!) 97.1 F (36.2 C) (Temporal)   Resp 18   Ht _0  (1.753 m)   Wt 188 lb (85.3 kg)   SpO2 98%   BMI 27.76 kg/m  Vitals with BMI 01/12/2020 01/12/2020 10/12/2019  Height _1  _2  _3   Weight 187 lbs 13 oz 188 lbs 177 lbs  BMI 27.72 16.07 37.10  Systolic 626 948 546  Diastolic 77 68 66  Pulse 66 69 69  Some encounter information is confidential and restricted. Go to Review Flowsheets activity to see all data.     Physical Exam Vitals and nursing note reviewed.  Constitutional:      Appearance: Normal appearance. He is well-developed, well-groomed and overweight.  HENT:     Head: Normocephalic and atraumatic.     Right Ear: External ear normal.     Left Ear: External ear normal.     Mouth/Throat:     Comments: Mask in place Eyes:     General:        Right eye: No discharge.        Left eye: No discharge.     Conjunctiva/sclera: Conjunctivae normal.  Cardiovascular:     Rate and Rhythm: Normal rate and regular rhythm.     Pulses: Normal pulses.     Heart sounds: Normal heart sounds.  Pulmonary:     Effort: Pulmonary effort is normal.     Breath sounds: Normal breath sounds.  Musculoskeletal:         General: Normal range of motion.     Cervical back: Normal range of motion and neck supple.  Skin:    General: Skin is warm.  Neurological:     General: No focal deficit present.     Mental Status: He is alert and oriented to person, place, and time.  Psychiatric:        Attention and Perception: Attention normal.        Mood and Affect: Mood  normal.        Speech: Speech normal.        Behavior: Behavior normal. Behavior is cooperative.    Assessment   1. Uncontrolled type 2 diabetes mellitus with hyperglycemia ([place])   2. Hypothyroidism, unspecified type     Tests ordered Orders Placed This Encounter  Procedures  . CBC  . COMPLETE METABOLIC PANEL WITH GFR  . TSH  . Ambulatory referral to Endocrinology  . POCT glycosylated hemoglobin (Hb A1C)     Plan: Please see assessment and plan per problem list above.   No orders of the defined types were placed in this encounter.   Patient to follow-up in 04/13/2020   Perlie Mayo, NP

## 2020-01-12 NOTE — Progress Notes (Signed)
Endocrinology Consult Note       01/12/2020, 11:20 AM   Subjective:    Patient ID: Stanley Levy, male    DOB: [birth year].  Stanley Levy is being seen in consultation for management of currently uncontrolled symptomatic diabetes requested by  Perlie Mayo, NP.   Past Medical History:  Diagnosis Date  . Acute on chronic combined systolic and diastolic CHF (congestive heart failure) ([place]) 09/13/2019  . Acute systolic heart failure ([place])   . Anxiety   . CHF (congestive heart failure) ([facility])    a. EF 20% with global HK by echo in 08/2019. Also noted to have LV mural thrombus.   . Diabetes mellitus, type II ([place])   . Hyperlipidemia   . Nonproliferative diabetic retinopathy ([place]) 12/27/2019  . Subacute confusional state 08/01/2019  . Thyroid disease     Past Surgical History:  Procedure Laterality Date  . FOOT SURGERY    . RIGHT/LEFT HEART CATH AND CORONARY ANGIOGRAPHY N/A 09/20/2019   Procedure: RIGHT/LEFT HEART CATH AND CORONARY ANGIOGRAPHY;  Surgeon: Leonie Man, MD;  Location: [facility];  Service: Cardiovascular;  Laterality: N/A;    Social History   Socioeconomic History  . Marital status: Married    Spouse name: Alphonzo Devera   . Number of children: Not on file  . Years of education: Not on file  . Highest education level: High school graduate  Occupational History  . Not on file  Tobacco Use  . Smoking status: Never Smoker  . Smokeless tobacco: Never Used  Vaping Use  . Vaping Use: Never used  Substance and Sexual Activity  . Alcohol use: No  . Drug use: No  . Sexual activity: Yes  Other Topics Concern  . Not on file  Social History Narrative   Recent left working at [employer] after getting sick in start of February      Lives with wife Stanley [employer]- married 8 (he is her caregiver, she has had strokes)    No children      Beagle: Stanley Levy       Enjoys: weight lift, yard work,  Oceanographer, read-       Diet: increasing veggies, was drinking chocolate milk, root beer, and sweet tea, fast food   Caffeine: sweet tea-3 cups of more daily    Water: 8 cups daily       Wears seat belt    Smoke detectors    Does not use phone while driving    Social Determinants of Health   Financial Resource Strain: Low Risk   . Difficulty of Paying Living Expenses: Not hard at all  Food Insecurity: No Food Insecurity  . Worried About Charity fundraiser in the Last Year: Never true  . Ran Out of Food in the Last Year: Never true  Transportation Needs: No Transportation Needs  . Lack of Transportation (Medical): No  . Lack of Transportation (Non-Medical): No  Physical Activity: Inactive  . Days of Exercise per Week: 0 days  . Minutes of Exercise per Session: 0 min  Stress: No Stress Concern Present  . Feeling of  Stress : Not at all  Social Connections: Moderately Integrated  . Frequency of Communication with Friends and Family: Twice a week  . Frequency of Social Gatherings with Friends and Family: Twice a week  . Attends Religious Services: 1 to 4 times per year  . Active Member of Clubs or Organizations: No  . Attends Archivist Meetings: Never  . Marital Status: Married    Family History  Problem Relation Age of Onset  . Heart attack Father        Near Age 69  . Anxiety disorder Neg Hx   . Dementia Neg Hx   . Alcohol abuse Neg Hx   . Drug abuse Neg Hx   . ADD / ADHD Neg Hx   . Bipolar disorder Neg Hx   . Depression Neg Hx   . OCD Neg Hx   . Paranoid behavior Neg Hx   . Physical abuse Neg Hx   . Schizophrenia Neg Hx   . Seizures Neg Hx   . Sexual abuse Neg Hx     Outpatient Encounter Medications as of 01/12/2020  Medication Sig  . acetaminophen (TYLENOL) 325 MG tablet Take 2 tablets (650 mg total) by mouth every 4 (four) hours as needed for headache or mild pain.  Marland Kitchen apixaban (ELIQUIS) 5 MG TABS tablet Take 1 tablet (5 mg total) by mouth 2 (two) times  daily.  . blood glucose meter kit and supplies Dispense based on patient and insurance preference. Use up to four times daily as directed. (FOR ICD-10 E10.9, E11.9).  Marland Kitchen digoxin (LANOXIN) 0.125 MG tablet Take 1 tablet (0.125 mg total) by mouth daily.  . furosemide (LASIX) 40 MG tablet Take 1 tablet (40 mg total) by mouth 2 (two) times daily.  Marland Kitchen gabapentin (NEURONTIN) 100 MG capsule Take 1 capsule (100 mg total) by mouth 3 (three) times daily.  Marland Kitchen glucose blood (GLUCOSE METER TEST) test strip Use as instructed  . insulin aspart protamine - aspart (NOVOLOG MIX 70/30 FLEXPEN) (70-30) 100 UNIT/ML FlexPen Inject 0.2 mLs (20 Units total) into the skin 2 (two) times daily with a meal.  . Insulin Pen Needle 31G X 5 MM MISC 1 Units by Does not apply route 2 (two) times daily.  . Lancets 30G MISC Check BG twice daily, once before breakfast and once before supper.  . levothyroxine (SYNTHROID) 75 MCG tablet Take 0.5 tablets (37.5 mcg total) by mouth daily before breakfast.  . losartan (COZAAR) 25 MG tablet Take 0.5 tablets (12.5 mg total) by mouth daily.  . metoprolol succinate (TOPROL-XL) 25 MG 24 hr tablet Take 1 tablet (25 mg total) by mouth daily.  Glory Rosebush Delica Lancets 27O MISC Four times daily testing  dx e11.65  . potassium chloride SA (KLOR-CON) 20 MEQ tablet Take 2 tablets (40 mEq total) by mouth daily.  . rosuvastatin (CRESTOR) 5 MG tablet Take 1 tablet (5 mg total) by mouth every evening.  Marland Kitchen spironolactone (ALDACTONE) 25 MG tablet Take 0.5 tablets (12.5 mg total) by mouth daily.  . tamsulosin (FLOMAX) 0.4 MG CAPS capsule Take 1 capsule (0.4 mg total) by mouth daily after supper.  . Vitamin D, Ergocalciferol, (DRISDOL) 1.25 MG (50000 UNIT) CAPS capsule Take 1 capsule (50,000 Units total) by mouth every 7 (seven) days.  . [DISCONTINUED] insulin aspart protamine - aspart (NOVOLOG MIX 70/30 FLEXPEN) (70-30) 100 UNIT/ML FlexPen Inject 0.1 mLs (10 Units total) into the skin 2 (two) times daily with a  meal. Start tomorrow morning - 09/23/19  No facility-administered encounter medications on file as of 01/12/2020.    ALLERGIES: No Known Allergies  VACCINATION STATUS: There is no immunization history for the selected administration types on file for this patient.  Diabetes He presents for his initial diabetic visit. He has type 2 diabetes mellitus. Onset time: He was diagnosed at approximate age of 1. His disease course has been worsening. There are no hypoglycemic associated symptoms. Pertinent negatives for hypoglycemia include no confusion, headaches, pallor or seizures. Associated symptoms include polydipsia and polyuria. Pertinent negatives for diabetes include no chest pain, no fatigue, no polyphagia and no weakness. There are no hypoglycemic complications. Symptoms are worsening. There are no diabetic complications. Risk factors for coronary artery disease include diabetes mellitus, dyslipidemia, family history, hypertension and male sex. Current diabetic treatment includes insulin injections (he was supposed to be on Novolog 70/30 10 units BID, however he is using 26 units qhs.). His weight is fluctuating minimally. He is following a generally unhealthy diet. When asked about meal planning, he reported none. He has not had a previous visit with a dietitian. He participates in exercise intermittently. His home blood glucose trend is increasing steadily. (He admits that he does not monitor blood glucose regularly.  When he does, he records numbers in the 300s frequently.) An ACE inhibitor/angiotensin II receptor blocker is being taken. He does not see a podiatrist.Eye exam is not current.  Hyperlipidemia This is a chronic problem. The current episode started more than 1 year ago. The problem is controlled. Exacerbating diseases include diabetes. Pertinent negatives include no chest pain, myalgias or shortness of breath. Current antihyperlipidemic treatment includes statins. Risk factors for  coronary artery disease include dyslipidemia, diabetes mellitus, hypertension, male sex and family history.  Hypertension This is a chronic problem. The current episode started more than 1 year ago. The problem is controlled. Pertinent negatives include no chest pain, headaches, neck pain, palpitations or shortness of breath. Risk factors for coronary artery disease include dyslipidemia, diabetes mellitus, male gender and family history. Past treatments include angiotensin blockers.     Review of Systems  Constitutional: Negative for chills, fatigue, fever and unexpected weight change.  HENT: Negative for dental problem, mouth sores and trouble swallowing.   Eyes: Negative for visual disturbance.  Respiratory: Negative for cough, choking, chest tightness, shortness of breath and wheezing.   Cardiovascular: Negative for chest pain, palpitations and leg swelling.  Gastrointestinal: Negative for abdominal distention, abdominal pain, constipation, diarrhea, nausea and vomiting.  Endocrine: Positive for polydipsia and polyuria. Negative for polyphagia.  Genitourinary: Negative for dysuria, flank pain, hematuria and urgency.  Musculoskeletal: Negative for back pain, gait problem, myalgias and neck pain.  Skin: Negative for pallor, rash and wound.  Neurological: Negative for seizures, syncope, weakness, numbness and headaches.  Psychiatric/Behavioral: Negative for confusion and dysphoric mood.    Objective:    Vitals with BMI 01/12/2020 01/12/2020 10/12/2019  Height 5' 9"  5' 9"  5' 9"   Weight 187 lbs 13 oz 188 lbs 177 lbs  BMI 27.72 95.32 02.33  Systolic 435 686 168  Diastolic 77 68 66  Pulse 66 69 69  Some encounter information is confidential and restricted. Go to Review Flowsheets activity to see all data.    BP 131/77   Pulse 66   Ht 5' 9"  (1.753 m)   Wt 187 lb 12.8 oz (85.2 kg)   BMI 27.73 kg/m   Wt Readings from Last 3 Encounters:  01/12/20 187 lb 12.8 oz (85.2 kg)  01/12/20 188  lb (  85.3 kg)  10/12/19 177 lb (80.3 kg)     Physical Exam Constitutional:      General: He is not in acute distress.    Appearance: He is well-developed.  HENT:     Head: Normocephalic and atraumatic.  Neck:     Thyroid: No thyromegaly.     Trachea: No tracheal deviation.  Cardiovascular:     Rate and Rhythm: Normal rate.     Pulses:          Dorsalis pedis pulses are 1+ on the right side and 1+ on the left side.       Posterior tibial pulses are 1+ on the right side and 1+ on the left side.     Heart sounds: Normal heart sounds, S1 normal and S2 normal. No murmur heard.  No gallop.   Pulmonary:     Effort: Pulmonary effort is normal. No respiratory distress.     Breath sounds: No wheezing.  Abdominal:     General: Bowel sounds are normal. There is no distension.     Tenderness: There is no abdominal tenderness. There is no guarding.  Musculoskeletal:     Right shoulder: No swelling or deformity.     Cervical back: Normal range of motion and neck supple.     Comments: Normal foot exam.  Skin:    General: Skin is warm and dry.     Findings: No rash.     Nails: There is no clubbing.  Neurological:     Mental Status: He is alert and oriented to person, place, and time.     Cranial Nerves: No cranial nerve deficit.     Sensory: No sensory deficit.     Gait: Gait normal.     Deep Tendon Reflexes: Reflexes are normal and symmetric.  Psychiatric:        Speech: Speech normal.        Behavior: Behavior normal. Behavior is cooperative.        Thought Content: Thought content normal.        Judgment: Judgment normal.       CMP ( most recent) CMP     Component Value Date/Time   NA 135 10/03/2019 1417   K 4.2 10/03/2019 1417   CL 96 (L) 10/03/2019 1417   CO2 29 10/03/2019 1417   GLUCOSE 575 (HH) 10/03/2019 1417   BUN 21 10/03/2019 1417   CREATININE 1.10 10/03/2019 1417   CREATININE 1.01 08/11/2019 0859   CALCIUM 9.0 10/03/2019 1417   PROT 6.3 (L) 09/13/2019 1600    ALBUMIN 3.0 (L) 09/13/2019 1600   AST 26 09/13/2019 1600   ALT 41 09/13/2019 1600   ALKPHOS 362 (H) 09/13/2019 1600   BILITOT 0.8 09/13/2019 1600   GFRNONAA >60 10/03/2019 1417   GFRNONAA 78 08/11/2019 0859   GFRAA >60 10/03/2019 1417   GFRAA 90 08/11/2019 0859     Diabetic Labs (most recent): Lab Results  Component Value Date   HGBA1C 12.9 (A) 01/12/2020   HGBA1C 12.9 01/12/2020   HGBA1C 12.9 (A) 01/12/2020   HGBA1C 12.9 (A) 01/12/2020     Lipid Panel ( most recent) Lipid Panel     Component Value Date/Time   CHOL 168 08/11/2019 0859   TRIG 185 (H) 08/11/2019 0859   HDL 56 08/11/2019 0859   CHOLHDL 3.0 08/11/2019 0859   LDLCALC 84 08/11/2019 0859      Lab Results  Component Value Date   TSH 8.634 (H) 10/03/2019   TSH  13.723 (H) 09/13/2019   TSH 15.913 (H) 08/16/2019   TSH 18.45 (H) 08/11/2019   TSH 15.414 (H) 08/01/2019       Assessment & Plan:   1. Uncontrolled type 2 diabetes mellitus with hyperglycemia ([place])  - THEODEN MAUCH has currently uncontrolled symptomatic type 2 DM since  64 years of age,  with most recent A1c of 12.9 %. Recent labs reviewed. - I had a long discussion with him about the progressive nature of diabetes and the pathology behind its complications. -He does not report gross complications of diabetes, however he remains at a high risk for more acute and chronic complications which include CAD, CVA, CKD, retinopathy, and neuropathy. These are all discussed in detail with him.  - I have counseled him on diet  and weight management  by adopting a carbohydrate restricted/protein rich diet. Patient is encouraged to switch to  unprocessed or minimally processed     complex starch and increased protein intake (animal or plant source), fruits, and vegetables. -  he is advised to stick to a routine mealtimes to eat 3 meals  a day and avoid unnecessary snacks ( to snack only to correct hypoglycemia).   - he admits that there is a room for  improvement in his food and drink choices. - Suggestion is made for him to avoid simple carbohydrates  from his diet including Cakes, Sweet Desserts, Ice Cream, Soda (diet and regular), Sweet Tea, Candies, Chips, Cookies, Store Bought Juices, Alcohol in Excess of  1-2 drinks a day, Artificial Sweeteners,  Coffee Creamer, and "Sugar-free" Products. This will help patient to have more stable blood glucose profile and potentially avoid unintended weight gain.  - he will be scheduled with Jearld Fenton, RDN, CDE for diabetes education.  - I have approached him with the following individualized plan to manage  his diabetes and patient agrees:   - he will continue to need multiple daily injections of insulin in order for him to achieve control of diabetes to target.  -For simplicity reasons, he is allowed to stay on his premixed insulin for now.  -He is advised to increase his NovoLog 70/30 to 20 units with breakfast and 20 units with supper for premeal blood glucose readings above 90 mg per DL, associated with strict monitoring of glucose 4 times a day-before meals and at bedtime. - he is warned not to take insulin without proper monitoring per orders. - Adjustment parameters are given to him for hypo and hyperglycemia in writing. - he is encouraged to call clinic for blood glucose levels less than 70 or above 300 mg /dl. - he does not seem to have contraindications for Metformin, glipizide, and GLP-1 receptor agonist.  He will be considered for his options as appropriate on subsequent visits. - Specific targets for  A1c;  LDL, HDL,  and Triglycerides were discussed with the patient.  2) Blood Pressure /Hypertension:  his blood pressure is  controlled to target.   he is advised to continue his current medications including losartan 25 mg p.o. daily with breakfast . 3) Lipids/Hyperlipidemia:   Review of his recent lipid panel showed  controlled  LDL at 84 .  he  is advised to continue    Crestor 5 mg  daily at bedtime.  Side effects and precautions discussed with him.  4)  Weight/Diet:  Body mass index is 27.73 kg/m.     he is not  a candidate for major weight loss. I discussed with him the  fact that loss of 5 - 10% of his  current body weight will have the most impact on his diabetes management.  Exercise, and detailed carbohydrates information provided  -  detailed on discharge instructions.   5) hypothyroidism: Recently diagnosed in April 2021. He is currently on Synthroid 37.5 mcg p.o. daily before breakfast.  - We discussed about the correct intake of his thyroid hormone, on empty stomach at fasting, with water, separated by at least 30 minutes from breakfast and other medications,  and separated by more than 4 hours from calcium, iron, multivitamins, acid reflux medications (PPIs). -Patient is made aware of the fact that thyroid hormone replacement is needed for life, dose to be adjusted by periodic monitoring of thyroid function tests.   6) Chronic Care/Health Maintenance:  -he  is on ACEI/ARB and Statin medications and  is encouraged to initiate and continue to follow up with Ophthalmology, Dentist,  Podiatrist at least yearly or according to recommendations, and advised to  stay away from smoking. I have recommended yearly flu vaccine and pneumonia vaccine at least every 5 years; moderate intensity exercise for up to 150 minutes weekly; and  sleep for at least 7 hours a day.  - he is  advised to maintain close follow up with Perlie Mayo, NP for primary care needs, as well as his other providers for optimal and coordinated care.   - Time spent in this patient care: 60 min, of which > 50% was spent in  counseling  him about his chronically uncontrolled type 2 diabetes, hypothyroidism, hyperlipidemia, hypertension and the rest reviewing his blood glucose logs , discussing his hypoglycemia and hyperglycemia episodes, reviewing his current and  previous labs / studies  ( including  abstraction from other facilities) and medications  doses and developing a  long term treatment plan based on the latest standards of care/ guidelines; and documenting his care.    Please refer to Patient Instructions for Blood Glucose Monitoring and Insulin/Medications Dosing Guide"  in media tab for additional information. Please  also refer to " Patient Self Inventory" in the Media  tab for reviewed elements of pertinent patient history.  Ferd Glassing participated in the discussions, expressed understanding, and voiced agreement with the above plans.  All questions were answered to his satisfaction. he is encouraged to contact clinic should he have any questions or concerns prior to his return visit.   Follow up plan: - Return in about 10 days (around 01/22/2020) for F/U with Meter and Logs Only - no Labs.  Glade Lloyd, MD [facility] [address] [place], [place] [address] Phone: [phone]  Fax: [phone]    01/12/2020, 11:20 AM  This note was partially dictated with voice recognition software. Similar sounding words can be transcribed inadequately or may not  be corrected upon review.

## 2020-01-12 NOTE — Patient Instructions (Addendum)
I appreciate the opportunity to provide you with care for your health and wellness. Today we discussed: diabetes  Follow up: 3 months   Labs today  Referrals made to Dr Fransico Him in past, please go to appt that was made for this, he will help make sure you are doing well with your diabetes and thyroid.  HAPPY BELATED BIRTHDAY!!!!!!  A1c today in office was _____ Please take insulin twice daily as ordered to keep blood sugar control  Please continue to practice social distancing to keep you, your family, and our community safe.  If you must go out, please wear a mask and practice good handwashing.  It was a pleasure to see you and I look forward to continuing to work together on your health and well-being. Please do not hesitate to call the office if you need care or have questions about your care.  Have a wonderful day and week. With Gratitude, Tereasa Coop, DNP, AGNP-BC

## 2020-01-13 LAB — COMPLETE METABOLIC PANEL WITH GFR
AG Ratio: 1.7 (calc) (ref 1.0–2.5)
ALT: 11 U/L (ref 9–46)
AST: 11 U/L (ref 10–35)
Albumin: 4 g/dL (ref 3.6–5.1)
Alkaline phosphatase (APISO): 59 U/L (ref 35–144)
BUN/Creatinine Ratio: 30 (calc) — ABNORMAL HIGH (ref 6–22)
BUN: 32 mg/dL — ABNORMAL HIGH (ref 7–25)
CO2: 25 mmol/L (ref 20–32)
Calcium: 9.5 mg/dL (ref 8.6–10.3)
Chloride: 96 mmol/L — ABNORMAL LOW (ref 98–110)
Creat: 1.07 mg/dL (ref 0.70–1.25)
GFR, Est African American: 83 mL/min/{1.73_m2} (ref >=60)
GFR, Est Non African American: 72 mL/min/{1.73_m2} (ref >=60)
Globulin: 2.4 g/dL (calc) (ref 1.9–3.7)
Glucose, Bld: 400 mg/dL — ABNORMAL HIGH (ref 65–139)
Potassium: 4.5 mmol/L (ref 3.5–5.3)
Sodium: 131 mmol/L — ABNORMAL LOW (ref 135–146)
Total Bilirubin: 0.5 mg/dL (ref 0.2–1.2)
Total Protein: 6.4 g/dL (ref 6.1–8.1)

## 2020-01-13 LAB — CBC
HCT: 40.9 % (ref 38.5–50.0)
Hemoglobin: 13.9 g/dL (ref 13.2–17.1)
MCH: 32.3 pg (ref 27.0–33.0)
MCHC: 34 g/dL (ref 32.0–36.0)
MCV: 94.9 fL (ref 80.0–100.0)
MPV: 11 fL (ref 7.5–12.5)
Platelets: 168 10*3/uL (ref 140–400)
RBC: 4.31 10*6/uL (ref 4.20–5.80)
RDW: 12.4 % (ref 11.0–15.0)
WBC: 8.3 10*3/uL (ref 3.8–10.8)

## 2020-01-13 LAB — TSH: TSH: 11.88 mIU/L — ABNORMAL HIGH (ref 0.40–4.50)

## 2020-01-13 NOTE — Assessment & Plan Note (Signed)
Thyroid was shown to be out of control earlier in the year.  Will be getting updated level and treat as appropriate in addition we will be referring to endocrinology to help with management.  Appreciate collaboration in care.

## 2020-01-13 NOTE — Assessment & Plan Note (Signed)
Referral to Dr. Fransico Him.  Updated labs ordered.  I have strongly encouraged him to check his blood sugars.  In addition to continuing all medications as directed.  I worried that he skips days or does not take them as directed.  I have educated him on making sure that he takes it as directed so that he does not end up back in the state that he was in previously.  Will be checking A1c to see if he has been consistent with taking his medications.  Close follow-up with Dr. Fransico Him appreciate collaboration in his care.

## 2020-01-23 ENCOUNTER — Ambulatory Visit: Payer: PPO | Admitting: Physician Assistant

## 2020-01-23 ENCOUNTER — Ambulatory Visit (INDEPENDENT_AMBULATORY_CARE_PROVIDER_SITE_OTHER): Payer: PPO | Admitting: "Endocrinology

## 2020-01-23 ENCOUNTER — Other Ambulatory Visit: Payer: Self-pay

## 2020-01-23 ENCOUNTER — Encounter: Payer: Self-pay | Admitting: Physician Assistant

## 2020-01-23 VITALS — BP 124/68 | HR 56 | Ht 69.0 in | Wt 190.0 lb

## 2020-01-23 DIAGNOSIS — E782 Mixed hyperlipidemia: Secondary | ICD-10-CM

## 2020-01-23 DIAGNOSIS — E039 Hypothyroidism, unspecified: Secondary | ICD-10-CM

## 2020-01-23 DIAGNOSIS — E1165 Type 2 diabetes mellitus with hyperglycemia: Secondary | ICD-10-CM | POA: Diagnosis not present

## 2020-01-23 DIAGNOSIS — I10 Essential (primary) hypertension: Secondary | ICD-10-CM

## 2020-01-23 MED ORDER — METFORMIN HCL ER 500 MG PO TB24: 500.0000 mg | ORAL_TABLET | Freq: Every day | ORAL | 3 refills | Status: DC

## 2020-01-23 MED ORDER — LEVOTHYROXINE SODIUM 50 MCG PO TABS: 50.0000 ug | ORAL_TABLET | Freq: Every day | ORAL | 1 refills | Status: DC

## 2020-01-23 NOTE — Progress Notes (Signed)
Endocrinology Consult Note       01/23/2020, 12:56 PM   Subjective:    Patient ID: Stanley Levy, male    DOB: Aug 10, 1955.  Stanley Levy is being seen in consultation for management of currently uncontrolled symptomatic diabetes requested by  Perlie Mayo, NP.   Past Medical History:  Diagnosis Date  . Anxiety   . Chronic combined systolic and diastolic CHF (congestive heart failure) (HCC)    a. EF 20% with global HK by echo in 08/2019. Also noted to have LV mural thrombus.   . Diabetes mellitus, type II ([place])   . Foot ulcer ([place])    a. normal ABIs in 2021.  Marland Kitchen History of noncompliance with medical treatment   . Hyperlipidemia   . Hypothyroidism   . LV (left ventricular) mural thrombus   . NICM (nonischemic cardiomyopathy) ([place])   . Nonproliferative diabetic retinopathy ([place]) 12/27/2019  . Subacute confusional state 08/01/2019  . Thyroid disease     Past Surgical History:  Procedure Laterality Date  . FOOT SURGERY    . RIGHT/LEFT HEART CATH AND CORONARY ANGIOGRAPHY N/A 09/20/2019   Procedure: RIGHT/LEFT HEART CATH AND CORONARY ANGIOGRAPHY;  Surgeon: Leonie Man, MD;  Location: [facility];  Service: Cardiovascular;  Laterality: N/A;    Social History   Socioeconomic History  . Marital status: Married    Spouse name: Stanley Levy   . Number of children: Not on file  . Years of education: Not on file  . Highest education level: High school graduate  Occupational History  . Not on file  Tobacco Use  . Smoking status: Never Smoker  . Smokeless tobacco: Never Used  Vaping Use  . Vaping Use: Never used  Substance and Sexual Activity  . Alcohol use: No  . Drug use: No  . Sexual activity: Yes  Other Topics Concern  . Not on file  Social History Narrative   Recent left working at [employer] after getting sick in start of February      Lives with wife Stanley Levy- married 31 (he is her  caregiver, she has had strokes)    No children      Beagle: Stanley Levy       Enjoys: weight lift, yard work, Oceanographer, read-       Diet: increasing veggies, was drinking chocolate milk, root beer, and sweet tea, fast food   Caffeine: sweet tea-3 cups of more daily    Water: 8 cups daily       Wears seat belt    Smoke detectors    Does not use phone while driving    Social Determinants of Health   Financial Resource Strain: Low Risk   . Difficulty of Paying Living Expenses: Not hard at all  Food Insecurity: No Food Insecurity  . Worried About Charity fundraiser in the Last Year: Never true  . Ran Out of Food in the Last Year: Never true  Transportation Needs: No Transportation Needs  . Lack of Transportation (Medical): No  . Lack of Transportation (Non-Medical): No  Physical Activity: Inactive  . Days of Exercise  per Week: 0 days  . Minutes of Exercise per Session: 0 min  Stress: No Stress Concern Present  . Feeling of Stress : Not at all  Social Connections: Moderately Integrated  . Frequency of Communication with Friends and Family: Twice a week  . Frequency of Social Gatherings with Friends and Family: Twice a week  . Attends Religious Services: 1 to 4 times per year  . Active Member of Clubs or Organizations: No  . Attends Archivist Meetings: Never  . Marital Status: Married    Family History  Problem Relation Age of Onset  . Heart attack Father        Near Age 67  . Anxiety disorder Neg Hx   . Dementia Neg Hx   . Alcohol abuse Neg Hx   . Drug abuse Neg Hx   . ADD / ADHD Neg Hx   . Bipolar disorder Neg Hx   . Depression Neg Hx   . OCD Neg Hx   . Paranoid behavior Neg Hx   . Physical abuse Neg Hx   . Schizophrenia Neg Hx   . Seizures Neg Hx   . Sexual abuse Neg Hx     Outpatient Encounter Medications as of 01/23/2020  Medication Sig  . acetaminophen (TYLENOL) 325 MG tablet Take 2 tablets (650 mg total) by mouth every 4 (four) hours as needed for  headache or mild pain.  Marland Kitchen apixaban (ELIQUIS) 5 MG TABS tablet Take 1 tablet (5 mg total) by mouth 2 (two) times daily.  . blood glucose meter kit and supplies Dispense based on patient and insurance preference. Use up to four times daily as directed. (FOR ICD-10 E10.9, E11.9).  Marland Kitchen digoxin (LANOXIN) 0.125 MG tablet Take 1 tablet (0.125 mg total) by mouth daily.  . furosemide (LASIX) 40 MG tablet Take 1 tablet (40 mg total) by mouth 2 (two) times daily.  Marland Kitchen gabapentin (NEURONTIN) 100 MG capsule Take 1 capsule (100 mg total) by mouth 3 (three) times daily.  Marland Kitchen glucose blood (GLUCOSE METER TEST) test strip Use as instructed  . insulin aspart protamine - aspart (NOVOLOG MIX 70/30 FLEXPEN) (70-30) 100 UNIT/ML FlexPen Inject 0.2 mLs (20 Units total) into the skin 2 (two) times daily with a meal.  . Insulin Pen Needle 31G X 5 MM Levy 1 Units by Does not apply route 2 (two) times daily.  . Lancets 30G Levy Check BG twice daily, once before breakfast and once before supper.  . levothyroxine (SYNTHROID) 50 MCG tablet Take 1 tablet (50 mcg total) by mouth daily before breakfast.  . losartan (COZAAR) 25 MG tablet Take 0.5 tablets (12.5 mg total) by mouth daily.  . metFORMIN (GLUCOPHAGE XR) 500 MG 24 hr tablet Take 1 tablet (500 mg total) by mouth daily with breakfast.  . metoprolol succinate (TOPROL-XL) 25 MG 24 hr tablet Take 1 tablet (25 mg total) by mouth daily.  Stanley Levy Four times daily testing  dx e11.65  . potassium chloride SA (KLOR-CON) 20 MEQ tablet Take 2 tablets (40 mEq total) by mouth daily.  . rosuvastatin (CRESTOR) 5 MG tablet Take 1 tablet (5 mg total) by mouth every evening.  Marland Kitchen spironolactone (ALDACTONE) 25 MG tablet Take 0.5 tablets (12.5 mg total) by mouth daily.  . tamsulosin (FLOMAX) 0.4 MG CAPS capsule Take 1 capsule (0.4 mg total) by mouth daily after supper.  . Vitamin D, Ergocalciferol, (DRISDOL) 1.25 MG (50000 UNIT) CAPS capsule Take 1 capsule (50,000 Units  total) by mouth every 7 (seven) days.  . [DISCONTINUED] levothyroxine (SYNTHROID) 75 MCG tablet Take 0.5 tablets (37.5 mcg total) by mouth daily before breakfast.   No facility-administered encounter medications on file as of 01/23/2020.    ALLERGIES: No Known Allergies  VACCINATION STATUS: There is no immunization history for the selected administration types on file for this patient.  Diabetes He presents for his initial diabetic visit. He has type 2 diabetes mellitus. Onset time: He was diagnosed at approximate age of 44. His disease course has been improving. There are no hypoglycemic associated symptoms. Pertinent negatives for hypoglycemia include no confusion, headaches, pallor or seizures. Pertinent negatives for diabetes include no chest pain, no fatigue, no polydipsia, no polyphagia, no polyuria and no weakness. There are no hypoglycemic complications. Symptoms are worsening. There are no diabetic complications. Risk factors for coronary artery disease include diabetes mellitus, dyslipidemia, family history, hypertension and male sex. Current diabetic treatment includes insulin injections (he was supposed to be on Novolog 70/30 10 units BID, however he is using 26 units qhs.). His weight is fluctuating minimally. He is following a generally unhealthy diet. When asked about meal planning, he reported none. He has not had a previous visit with a dietitian. He participates in exercise intermittently. His home blood glucose trend is decreasing steadily. His breakfast blood glucose range is generally 140-180 mg/dl. His lunch blood glucose range is generally 140-180 mg/dl. His dinner blood glucose range is generally 140-180 mg/dl. His bedtime blood glucose range is generally 140-180 mg/dl. His overall blood glucose range is 140-180 mg/dl. (He presents with significant improvement in his glycemic profile.  This is despite his recent A1c of 12.9% on January 12, 2020.  He did not have any hypoglycemia.  )  An ACE inhibitor/angiotensin II receptor blocker is being taken. He does not see a podiatrist.Eye exam is not current.  Hyperlipidemia This is a chronic problem. The current episode started more than 1 year ago. The problem is controlled. Exacerbating diseases include diabetes. Pertinent negatives include no chest pain, myalgias or shortness of breath. Current antihyperlipidemic treatment includes statins. Risk factors for coronary artery disease include dyslipidemia, diabetes mellitus, hypertension, male sex and family history.  Hypertension This is a chronic problem. The current episode started more than 1 year ago. The problem is controlled. Pertinent negatives include no chest pain, headaches, neck pain, palpitations or shortness of breath. Risk factors for coronary artery disease include dyslipidemia, diabetes mellitus, male gender and family history. Past treatments include angiotensin blockers.     Review of Systems  Constitutional: Negative for chills, fatigue, fever and unexpected weight change.  HENT: Negative for dental problem, mouth sores and trouble swallowing.   Eyes: Negative for visual disturbance.  Respiratory: Negative for cough, choking, chest tightness, shortness of breath and wheezing.   Cardiovascular: Negative for chest pain, palpitations and leg swelling.  Gastrointestinal: Negative for abdominal distention, abdominal pain, constipation, diarrhea, nausea and vomiting.  Endocrine: Negative for polydipsia, polyphagia and polyuria.  Genitourinary: Negative for dysuria, flank pain, hematuria and urgency.  Musculoskeletal: Negative for back pain, gait problem, myalgias and neck pain.  Skin: Negative for pallor, rash and wound.  Neurological: Negative for seizures, syncope, weakness, numbness and headaches.  Psychiatric/Behavioral: Negative for confusion and dysphoric mood.    Objective:    Vitals with BMI 01/23/2020 01/12/2020 01/12/2020  Height 5' 9"  5' 9"  5' 9"   Weight  190 lbs 187 lbs 13 oz 188 lbs  BMI 28.05 81.01 75.10  Systolic 258 527  016  Diastolic 68 77 68  Pulse 56 66 69  Some encounter information is confidential and restricted. Go to Review Flowsheets activity to see all data.    BP 124/68   Pulse 56   Ht 5' 9"  (1.753 m)   Wt 190 lb (86.2 kg)   BMI 28.06 kg/m   Wt Readings from Last 3 Encounters:  01/23/20 190 lb (86.2 kg)  01/12/20 187 lb 12.8 oz (85.2 kg)  01/12/20 188 lb (85.3 kg)     Physical Exam Constitutional:      General: He is not in acute distress.    Appearance: He is well-developed.  HENT:     Head: Normocephalic and atraumatic.  Neck:     Thyroid: No thyromegaly.     Trachea: No tracheal deviation.  Cardiovascular:     Rate and Rhythm: Normal rate.     Pulses:          Dorsalis pedis pulses are 1+ on the right side and 1+ on the left side.       Posterior tibial pulses are 1+ on the right side and 1+ on the left side.     Heart sounds: Normal heart sounds, S1 normal and S2 normal. No murmur heard.  No gallop.   Pulmonary:     Effort: Pulmonary effort is normal. No respiratory distress.     Breath sounds: No wheezing.  Abdominal:     General: Bowel sounds are normal. There is no distension.     Tenderness: There is no abdominal tenderness. There is no guarding.  Musculoskeletal:     Right shoulder: No swelling or deformity.     Cervical back: Normal range of motion and neck supple.     Comments: Normal foot exam.  Skin:    General: Skin is warm and dry.     Findings: No rash.     Nails: There is no clubbing.  Neurological:     Mental Status: He is alert and oriented to person, place, and time.     Cranial Nerves: No cranial nerve deficit.     Sensory: No sensory deficit.     Gait: Gait normal.     Deep Tendon Reflexes: Reflexes are normal and symmetric.  Psychiatric:        Speech: Speech normal.        Behavior: Behavior normal. Behavior is cooperative.        Thought Content: Thought content normal.         Judgment: Judgment normal.      CMP ( most recent) CMP     Component Value Date/Time   NA 131 (L) 01/12/2020 1033   K 4.5 01/12/2020 1033   CL 96 (L) 01/12/2020 1033   CO2 25 01/12/2020 1033   GLUCOSE 400 (H) 01/12/2020 1033   BUN 32 (H) 01/12/2020 1033   CREATININE 1.07 01/12/2020 1033   CALCIUM 9.5 01/12/2020 1033   PROT 6.4 01/12/2020 1033   ALBUMIN 3.0 (L) 09/13/2019 1600   AST 11 01/12/2020 1033   ALT 11 01/12/2020 1033   ALKPHOS 362 (H) 09/13/2019 1600   BILITOT 0.5 01/12/2020 1033   GFRNONAA 72 01/12/2020 1033   GFRAA 83 01/12/2020 1033     Diabetic Labs (most recent): Lab Results  Component Value Date   HGBA1C 12.9 (A) 01/12/2020   HGBA1C 12.9 01/12/2020   HGBA1C 12.9 (A) 01/12/2020   HGBA1C 12.9 (A) 01/12/2020     Lipid Panel ( most recent) Lipid Panel  Component Value Date/Time   CHOL 168 08/11/2019 0859   TRIG 185 (H) 08/11/2019 0859   HDL 56 08/11/2019 0859   CHOLHDL 3.0 08/11/2019 0859   LDLCALC 84 08/11/2019 0859      Lab Results  Component Value Date   TSH 11.88 (H) 01/12/2020   TSH 8.634 (H) 10/03/2019   TSH 13.723 (H) 09/13/2019   TSH 15.913 (H) 08/16/2019   TSH 18.45 (H) 08/11/2019   TSH 15.414 (H) 08/01/2019       Assessment & Plan:   1. Uncontrolled type 2 diabetes mellitus with hyperglycemia (HCC)  - BRAINARD HIGHFILL has currently uncontrolled symptomatic type 2 DM since  64 years of age. He presents with significant improvement in his glycemic profile.  This is despite his recent A1c of 12.9% on January 12, 2020.  He did not have any hypoglycemia.  - Recent labs reviewed. - I had a long discussion with him about the progressive nature of diabetes and the pathology behind its complications. -He does not report gross complications of diabetes, however he remains at a high risk for more acute and chronic complications which include CAD, CVA, CKD, retinopathy, and neuropathy. These are all discussed in detail with him.  -  I have counseled him on diet  and weight management  by adopting a carbohydrate restricted/protein rich diet. Patient is encouraged to switch to  unprocessed or minimally processed     complex starch and increased protein intake (animal or plant source), fruits, and vegetables. -  he is advised to stick to a routine mealtimes to eat 3 meals  a day and avoid unnecessary snacks ( to snack only to correct hypoglycemia).   - he  admits there is a room for improvement in his diet and drink choices. -  Suggestion is made for him to avoid simple carbohydrates  from his diet including Cakes, Sweet Desserts / Pastries, Ice Cream, Soda (diet and regular), Sweet Tea, Candies, Chips, Cookies, Sweet Pastries,  Store Bought Juices, Alcohol in Excess of  1-2 drinks a day, Artificial Sweeteners, Coffee Creamer, and "Sugar-free" Products. This will help patient to have stable blood glucose profile and potentially avoid unintended weight gain.   - he will be scheduled with Jearld Fenton, RDN, CDE for diabetes education.  - I have approached him with the following individualized plan to manage  his diabetes and patient agrees:   - he will continue to need multiple daily injections of insulin in order for him to achieve control of diabetes to target.  -For simplicity reasons, he is allowed to stay on his premixed insulin for now.  -He is advised to continue NovoLog 70/30 20  units with breakfast and 20 units with supper for premeal blood glucose readings above 90 mg per DL, associated with strict monitoring of glucose 4 times a day-before meals and at bedtime. - he is warned not to take insulin without proper monitoring per orders. - Adjustment parameters are given to him for hypo and hyperglycemia in writing. - he is encouraged to call clinic for blood glucose levels less than 70 or above 300 mg /dl. - he does not seem to have contraindications for Metformin, glipizide, and GLP-1 receptor agonist.  -I discussed and  added Metformin 500 mg ER p.o. daily after breakfast.     - Specific targets for  A1c;  LDL, HDL,  and Triglycerides were discussed with the patient.  2) Blood Pressure /Hypertension: His blood pressure is controlled to target.  he  is advised to continue his current medications including losartan 25 mg p.o. daily with breakfast . 3) Lipids/Hyperlipidemia:   Review of his recent lipid panel showed  controlled  LDL at 84 .  he  is advised to continue Crestor 5 mg p.o. daily at bedtime. Side effects and precautions discussed with him.  4)  Weight/Diet:  Body mass index is 28.06 kg/m.     he is not  a candidate for major weight loss. I discussed with him the fact that loss of 5 - 10% of his  current body weight will have the most impact on his diabetes management.  Exercise, and detailed carbohydrates information provided  -  detailed on discharge instructions.   5) hypothyroidism: Recently diagnosed in April 2021.  His recent thyroid function tests are consistent with inadequate replacement.  I discussed and increase his Synthroid to 50 mcg p.o. daily before breakfast.  - We discussed about the correct intake of his thyroid hormone, on empty stomach at fasting, with water, separated by at least 30 minutes from breakfast and other medications,  and separated by more than 4 hours from calcium, iron, multivitamins, acid reflux medications (PPIs). -Patient is made aware of the fact that thyroid hormone replacement is needed for life, dose to be adjusted by periodic monitoring of thyroid function tests.  6) Chronic Care/Health Maintenance:  -he  is on ACEI/ARB and Statin medications and  is encouraged to initiate and continue to follow up with Ophthalmology, Dentist,  Podiatrist at least yearly or according to recommendations, and advised to  stay away from smoking. I have recommended yearly flu vaccine and pneumonia vaccine at least every 5 years; moderate intensity exercise for up to 150 minutes weekly;  and  sleep for at least 7 hours a day.  - he is  advised to maintain close follow up with Perlie Mayo, NP for primary care needs, as well as his other providers for optimal and coordinated care.  - Time spent on this patient care encounter:  35 min, of which > 50% was spent in  counseling and the rest reviewing his blood glucose logs , discussing his hypoglycemia and hyperglycemia episodes, reviewing his current and  previous labs / studies  ( including abstraction from other facilities) and medications  doses and developing a  long term treatment plan and documenting his care.   Please refer to Patient Instructions for Blood Glucose Monitoring and Insulin/Medications Dosing Guide"  in media tab for additional information. Please  also refer to " Patient Self Inventory" in the Media  tab for reviewed elements of pertinent patient history.  Ferd Glassing participated in the discussions, expressed understanding, and voiced agreement with the above plans.  All questions were answered to his satisfaction. he is encouraged to contact clinic should he have any questions or concerns prior to his return visit.    Follow up plan: - No follow-ups on file.  Glade Lloyd, MD [facility] [address] [place], [place] [address] Phone: [phone]  Fax: [phone]    01/23/2020, 12:56 PM  This note was partially dictated with voice recognition software. Similar sounding words can be transcribed inadequately or may not  be corrected upon review.

## 2020-01-23 NOTE — Patient Instructions (Signed)

## 2020-01-23 NOTE — Progress Notes (Deleted)
Cardiology Office Note    Date:  01/23/2020   ID:  Stanley, Levy 05-Apr-1956, MRN [id number]  PCP:  Freddy Finner, NP  Cardiologist:  Prentice Docker, MD (Inactive)  Electrophysiologist:  None   Chief Complaint: f/u CHF  History of Present Illness:   Stanley Levy is a 64 y.o. male with history of HTN, HLD, uncontrolled Type 2 DM, medical noncompliance, CVA, chronic combined CHF/NICM, LV thrombus, hypothyroidism, foot ulcers who presents to the office today for hospital follow-up.   He was admitted to [facility] 08/2019 for an acute CHF exacerbation and was found to have a newly diagnosed cardiomyopathy with EF at 20% and global HK. Echo also showed a large LV mural thrombus. He was started on Eliquis over Coumadin for his thrombus given concerns for noncompliance. He diuresed well with Lasix and attempts were made to add Coreg and Lisinopril to his medication regimen but he developed hypotension with those. Options were reviewed with the patient in regards to medical therapy vs. transfer to [facility] for AHF consult and R/LHC. Palliative Care was also following the patient and he did not wish to undergo aggressive treatment at that time but to review options as an outpatient. A1C was >18.5 at that time. He was seen in the office 08/2019 and was volume overloaded so was admitted and underwent [facility] 08/2019 showing normal coronary arteries, LVEDP severely elevated, prompting additional diuresis. At last OV 09/2019 his BP was soft so medications not increased. F/u planned for 2 weeks but does not appear this occurred. ABIs done for foot ulcers were normal.  Repeat echo for ICD  NICM Chronic combined CHF LV thrombus Essential HTN Hyperlipidemia pcp [facility]  Labwork independently reviewed:  01/12/20 TSH wnl, BUN 32, Cr 1.07, Na 131 (but CBG 400 so likely pseudohyponatremia), AST/ALT OK, normal CBC, A1C 12.9 08/2019 LDL 84  Past Medical History:  Diagnosis Date   Anxiety    Chronic  combined systolic and diastolic CHF (congestive heart failure) (HCC)    a. EF 20% with global HK by echo in 08/2019. Also noted to have LV mural thrombus.    Diabetes mellitus, type II (HCC)    Foot ulcer (HCC)    a. normal ABIs in 2021.   History of noncompliance with medical treatment    Hyperlipidemia    Hypothyroidism    LV (left ventricular) mural thrombus    NICM (nonischemic cardiomyopathy) (HCC)    Nonproliferative diabetic retinopathy (HCC) 12/27/2019   Subacute confusional state 08/01/2019   Thyroid disease     Past Surgical History:  Procedure Laterality Date   FOOT SURGERY     RIGHT/LEFT HEART CATH AND CORONARY ANGIOGRAPHY N/A 09/20/2019   Procedure: RIGHT/LEFT HEART CATH AND CORONARY ANGIOGRAPHY;  Surgeon: Marykay Lex, MD;  Location: [facility] INVASIVE CV LAB;  Service: Cardiovascular;  Laterality: N/A;    Current Medications: No outpatient medications have been marked as taking for the 01/23/20 encounter (Appointment) with [name], PA-C.   ***   Allergies:   Patient has no known allergies.   Social History   Socioeconomic History   Marital status: Married    Spouse name: Blythe Hartshorn    Number of children: Not on file   Years of education: Not on file   Highest education level: High school graduate  Occupational History   Not on file  Tobacco Use   Smoking status: Never Smoker   Smokeless tobacco: Never Used  Vaping Use   Vaping  Use: Never used  Substance and Sexual Activity   Alcohol use: No   Drug use: No   Sexual activity: Yes  Other Topics Concern   Not on file  Social History Narrative   Recent left working at [employer] after getting sick in start of February      Lives with wife Camelia Levy- married 28 (he is her caregiver, she has had strokes)    No children      Beagle: Valentina Gu       Enjoys: weight lift, yard work, Scientist, physiological, read-       Diet: increasing veggies, was drinking chocolate milk, root beer, and sweet tea, fast food     Caffeine: sweet tea-3 cups of more daily    Water: 8 cups daily       Wears seat belt    Smoke detectors    Does not use phone while driving    Social Determinants of Corporate investment banker Strain: Low Risk    Difficulty of Paying Living Expenses: Not hard at all  Food Insecurity: No Food Insecurity   Worried About Programme researcher, broadcasting/film/video in the Last Year: Never true   Barista in the Last Year: Never true  Transportation Needs: No Transportation Needs   Lack of Transportation (Medical): No   Lack of Transportation (Non-Medical): No  Physical Activity: Inactive   Days of Exercise per Week: 0 days   Minutes of Exercise per Session: 0 min  Stress: No Stress Concern Present   Feeling of Stress : Not at all  Social Connections: Moderately Integrated   Frequency of Communication with Friends and Family: Twice a week   Frequency of Social Gatherings with Friends and Family: Twice a week   Attends Religious Services: 1 to 4 times per year   Active Member of [employer] or Organizations: No   Attends Banker Meetings: Never   Marital Status: Married     Family History:  The patient's ***family history includes Heart attack in his father. There is no history of Anxiety disorder, Dementia, Alcohol abuse, Drug abuse, ADD / ADHD, Bipolar disorder, Depression, OCD, Paranoid behavior, Physical abuse, Schizophrenia, Seizures, or Sexual abuse.  ROS:   Please see the history of present illness. Otherwise, review of systems is positive for ***.  All other systems are reviewed and otherwise negative.    EKGs/Labs/Other Studies Reviewed:    Studies reviewed are outlined and summarized above. Reports included below if pertinent.   2D Echo 08/2019 1. Left ventricular ejection fraction, by estimation, is 20%. The left  ventricle has severely decreased function. The left ventricle demonstrates  global hypokinesis with regional variation.  2. Large LV mural  thrombus in distal anterolateral position,  approximately 2.0 x 2.5 cm (trabecular muscle obscures extent to some  degree). Reported to Dr. Mariea Clonts.  3. Right ventricular systolic function is moderately reduced. The right  ventricular size is normal. There is mildly elevated pulmonary artery  systolic pressure. The estimated right ventricular systolic pressure is  32.4 mmHg.  4. Left atrial size was mildly dilated.  5. Right atrial size was moderately dilated.  6. The mitral valve is grossly normal. Trivial mitral valve  regurgitation.  7. The aortic valve is tricuspid. Aortic valve regurgitation is not  visualized.  8. The inferior vena cava is normal in size with greater than 50%  respiratory variability, suggesting right atrial pressure of 3 mmHg.   [facility] 08/2019  Hemodynamic findings consistent with  moderate pulmonary hypertension.  LV end diastolic pressure is severely elevated.  Angiographically normal coronary arteries; large draping vessels. Left dominant system   SUMMARY  Nonischemic cardiomyopathy: Angiographically normal coronary arteries with Left dominant system  Severe combined systolic and diastolic cardiomyopathy: EF severely reduced by EF, PCWP of 30 with LVEDP of 36.   RECOMMENDATIONS  Return to nursing unit for ongoing care.  We will increase Lasix to 80 mg twice daily.  80 mg given in Cath Lab.  Continue to titrate guideline directed medical management for nonischemic cardiomyopathy.    [name], MD     EKG:  EKG is ordered today, personally reviewed, demonstrating ***  Recent Labs: 09/13/2019: B Natriuretic Peptide 1,082.0 09/19/2019: Magnesium 1.9 01/12/2020: ALT 11; BUN 32; Creat 1.07; Hemoglobin 13.9; Platelets 168; Potassium 4.5; Sodium 131; TSH 11.88  Recent Lipid Panel    Component Value Date/Time   CHOL 168 08/11/2019 0859   TRIG 185 (H) 08/11/2019 0859   HDL 56 08/11/2019 0859   CHOLHDL 3.0 08/11/2019 0859   LDLCALC 84  08/11/2019 0859    PHYSICAL EXAM:    VS:  There were no vitals taken for this visit.  BMI: There is no height or weight on file to calculate BMI.  GEN: Well nourished, well developed, in no acute distress HEENT: normocephalic, atraumatic Neck: no JVD, carotid bruits, or masses Cardiac: ***RRR; no murmurs, rubs, or gallops, no edema  Respiratory:  clear to auscultation bilaterally, normal work of breathing GI: soft, nontender, nondistended, + BS MS: no deformity or atrophy Skin: warm and dry, no rash Neuro:  Alert and Oriented x 3, Strength and sensation are intact, follows commands Psych: euthymic mood, full affect  Wt Readings from Last 3 Encounters:  01/12/20 187 lb 12.8 oz (85.2 kg)  01/12/20 188 lb (85.3 kg)  10/12/19 177 lb (80.3 kg)     ASSESSMENT & PLAN:   1. ***  Disposition: F/u with ***   Medication Adjustments/Labs and Tests Ordered: Current medicines are reviewed at length with the patient today.  Concerns regarding medicines are outlined above. Medication changes, Labs and Tests ordered today are summarized above and listed in the Patient Instructions accessible in Encounters.    Signed, [name], PA-C  01/23/2020 7:56 AM    [facility] [place] Location in [facility] [address] [place], [place] [address] Ph: [phone]; Fax [phone]

## 2020-01-26 ENCOUNTER — Telehealth (HOSPITAL_COMMUNITY): Payer: Self-pay | Admitting: Psychiatry

## 2020-01-26 NOTE — Telephone Encounter (Signed)
Patient came in asking for refill, advised over one year since being seen by provider. Advised may need a new referral, but will advise Dr. Tenny Craw of request and will contact if further information is needed.

## 2020-01-26 NOTE — Telephone Encounter (Signed)
No meds prescribed by me are currently on his list. He has had significant changes in his health status in the past year, so will need a 30 min appt

## 2020-02-01 ENCOUNTER — Telehealth (INDEPENDENT_AMBULATORY_CARE_PROVIDER_SITE_OTHER): Payer: No Typology Code available for payment source | Admitting: Psychiatry

## 2020-02-01 ENCOUNTER — Encounter (HOSPITAL_COMMUNITY): Payer: Self-pay | Admitting: Psychiatry

## 2020-02-01 ENCOUNTER — Other Ambulatory Visit: Payer: Self-pay

## 2020-02-01 ENCOUNTER — Other Ambulatory Visit (HOSPITAL_COMMUNITY): Payer: Self-pay | Admitting: Psychiatry

## 2020-02-01 DIAGNOSIS — F411 Generalized anxiety disorder: Secondary | ICD-10-CM

## 2020-02-01 MED ORDER — ESCITALOPRAM OXALATE 10 MG PO TABS
10.0000 mg | ORAL_TABLET | Freq: Every day | ORAL | 2 refills | Status: DC
Start: 2020-02-01 — End: 2020-05-03

## 2020-02-01 MED ORDER — IMIPRAMINE HCL 50 MG PO TABS: 50.0000 mg | ORAL_TABLET | Freq: Every day | ORAL | 3 refills | Status: DC

## 2020-02-01 NOTE — Progress Notes (Signed)
Virtual Visit via Telephone Note  I connected with Stanley Levy on 02/01/20 at 11:10 AM EDT by telephone and verified that I am speaking with the correct person using two identifiers.   I discussed the limitations, risks, security and privacy concerns of performing an evaluation and management service by telephone and the availability of in person appointments. I also discussed with the patient that there may be a patient responsible charge related to this service. The patient expressed understanding and agreed to proceed.    I discussed the assessment and treatment plan with the patient. The patient was provided an opportunity to ask questions and all were answered. The patient agreed with the plan and demonstrated an understanding of the instructions.   The patient was advised to call back or seek an in-person evaluation if the symptoms worsen or if the condition fails to improve as anticipated.  I provided 15 minutes of non-face-to-face time during this encounter. Patient: Provider Home, patient home  Stanley Spiller, MD  [facility] MD/PA/NP OP Progress Note  02/01/2020 11:28 AM Stanley Levy  MRN:  [id number]  Chief Complaint:  Chief Complaint    Anxiety; Depression; Follow-up     HPI:  This patient is a 64 year old married white male who lives with his wife in [place]. They have no children. 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 He began having recurrent panic attacks. His family doctor referred him to a psychiatrist in [place] who started him on imipramine and Xanax. After a while he no longer needed the Xanax but the imipramine has kept the panic attacks at [place]. His mood is good and he is very active at work and he also goes to the gym to lift weights. He takes very good care of his health. He only has elevated cholesterol and mild hypothyroidism for his health  problems..   The patient returns for follow-up after more than a year.  In the intervening time he has had numerous health issues emerge.  He was hospitalized several times for congestive heart failure.  He is on several cardiac medications now.  He also has type 2 diabetes which got very much out of control.  His most recent A1c is 12.9.  The sugars have been into the 4 and 500s.  He is going to various doctors for all of these issues.  He states that he has been off the imipramine several months and notes that he has been more snappy irritable and anxious.  He is not sleeping as well.  He denies severe depression or suicidal ideation.  He states that he "feels good" and is no longer having any chest pain.  He is on a diabetic diet and taking all of his medications faithfully.  He would like to go back on the imipramine.  I think this is okay but because it can have some cardiac implications I have mentioned this to his cardiologist. Visit Diagnosis:    ICD-10-CM   1. Generalized anxiety disorder  F41.1 imipramine (TOFRANIL) 50 MG tablet    Past Psychiatric History: No inpatient admissions, long history of anxiety disorder  Past Medical History:  Past Medical History:  Diagnosis Date  . Anxiety   . Chronic combined systolic and diastolic CHF (congestive heart failure) (HCC)    a. EF 20% with global HK by echo in 08/2019. Also noted to have LV  mural thrombus.   . Diabetes mellitus, type II ([place])   . Foot ulcer ([place])    a. normal ABIs in 2021.  Marland Kitchen History of noncompliance with medical treatment   . Hyperlipidemia   . Hypothyroidism   . LV (left ventricular) mural thrombus   . NICM (nonischemic cardiomyopathy) ([place])   . Nonproliferative diabetic retinopathy ([place]) 12/27/2019  . Subacute confusional state 08/01/2019  . Thyroid disease     Past Surgical History:  Procedure Laterality Date  . FOOT SURGERY    . RIGHT/LEFT HEART CATH AND CORONARY ANGIOGRAPHY N/A 09/20/2019   Procedure:  RIGHT/LEFT HEART CATH AND CORONARY ANGIOGRAPHY;  Surgeon: Leonie Man, MD;  Location: [place] CV LAB;  Service: Cardiovascular;  Laterality: N/A;    Family Psychiatric History: none  Family History:  Family History  Problem Relation Age of Onset  . Heart attack Father        Near Age 52  . Anxiety disorder Neg Hx   . Dementia Neg Hx   . Alcohol abuse Neg Hx   . Drug abuse Neg Hx   . ADD / ADHD Neg Hx   . Bipolar disorder Neg Hx   . Depression Neg Hx   . OCD Neg Hx   . Paranoid behavior Neg Hx   . Physical abuse Neg Hx   . Schizophrenia Neg Hx   . Seizures Neg Hx   . Sexual abuse Neg Hx     Social History:  Social History   Socioeconomic History  . Marital status: Married    Spouse name: Stanley Levy   . Number of children: Not on file  . Years of education: Not on file  . Highest education level: High school graduate  Occupational History  . Not on file  Tobacco Use  . Smoking status: Never Smoker  . Smokeless tobacco: Never Used  Vaping Use  . Vaping Use: Never used  Substance and Sexual Activity  . Alcohol use: No  . Drug use: No  . Sexual activity: Yes  Other Topics Concern  . Not on file  Social History Narrative   Recent left working at [employer] after getting sick in start of February      Lives with wife Stanley Levy- married 79 (he is her caregiver, she has had strokes)    No children      Beagle: Stanley Levy       Enjoys: weight lift, yard work, Oceanographer, read-       Diet: increasing veggies, was drinking chocolate milk, root beer, and sweet tea, fast food   Caffeine: sweet tea-3 cups of more daily    Water: 8 cups daily       Wears seat belt    Smoke detectors    Does not use phone while driving    Social Determinants of Health   Financial Resource Strain: Low Risk   . Difficulty of Paying Living Expenses: Not hard at all  Food Insecurity: No Food Insecurity  . Worried About Charity fundraiser in the Last Year: Never true  . Ran Out of Food in  the Last Year: Never true  Transportation Needs: No Transportation Needs  . Lack of Transportation (Medical): No  . Lack of Transportation (Non-Medical): No  Physical Activity: Inactive  . Days of Exercise per Week: 0 days  . Minutes of Exercise per Session: 0 min  Stress: No Stress Concern Present  . Feeling of Stress : Not at all  Social Connections:  Moderately Integrated  . Frequency of Communication with Friends and Family: Twice a week  . Frequency of Social Gatherings with Friends and Family: Twice a week  . Attends Religious Services: 1 to 4 times per year  . Active Member of Clubs or Organizations: No  . Attends Archivist Meetings: Never  . Marital Status: Married    Allergies: No Known Allergies  Metabolic Disorder Labs: Lab Results  Component Value Date   HGBA1C 12.9 (A) 01/12/2020   HGBA1C 12.9 01/12/2020   HGBA1C 12.9 (A) 01/12/2020   HGBA1C 12.9 (A) 01/12/2020   MPG NOT CALCULATED 08/01/2019   MPG 146 (H) 05/29/2011   No results found for: PROLACTIN Lab Results  Component Value Date   CHOL 168 08/11/2019   TRIG 185 (H) 08/11/2019   HDL 56 08/11/2019   CHOLHDL 3.0 08/11/2019   LDLCALC 84 08/11/2019   Lab Results  Component Value Date   TSH 11.88 (H) 01/12/2020   TSH 8.634 (H) 10/03/2019    Therapeutic Level Labs: No results found for: LITHIUM No results found for: VALPROATE No components found for:  CBMZ  Current Medications: Current Outpatient Medications  Medication Sig Dispense Refill  . acetaminophen (TYLENOL) 325 MG tablet Take 2 tablets (650 mg total) by mouth every 4 (four) hours as needed for headache or mild pain. 12 tablet 0  . apixaban (ELIQUIS) 5 MG TABS tablet Take 1 tablet (5 mg total) by mouth 2 (two) times daily. 60 tablet 3  . blood glucose meter kit and supplies Dispense based on patient and insurance preference. Use up to four times daily as directed. (FOR ICD-10 E10.9, E11.9). 1 each 0  . digoxin (LANOXIN) 0.125 MG  tablet Take 1 tablet (0.125 mg total) by mouth daily. 30 tablet 3  . furosemide (LASIX) 40 MG tablet Take 1 tablet (40 mg total) by mouth 2 (two) times daily. 180 tablet 3  . gabapentin (NEURONTIN) 100 MG capsule Take 1 capsule (100 mg total) by mouth 3 (three) times daily. 90 capsule 2  . glucose blood (GLUCOSE METER TEST) test strip Use as instructed 400 each 3  . imipramine (TOFRANIL) 50 MG tablet Take 1 tablet (50 mg total) by mouth at bedtime. 90 tablet 3  . insulin aspart protamine - aspart (NOVOLOG MIX 70/30 FLEXPEN) (70-30) 100 UNIT/ML FlexPen Inject 0.2 mLs (20 Units total) into the skin 2 (two) times daily with a meal. 5 pen 2  . Insulin Pen Needle 31G X 5 MM MISC 1 Units by Does not apply route 2 (two) times daily. 100 each 5  . Lancets 30G MISC Check BG twice daily, once before breakfast and once before supper. 50 each 11  . levothyroxine (SYNTHROID) 50 MCG tablet Take 1 tablet (50 mcg total) by mouth daily before breakfast. 90 tablet 1  . losartan (COZAAR) 25 MG tablet Take 0.5 tablets (12.5 mg total) by mouth daily. 90 tablet 3  . metFORMIN (GLUCOPHAGE XR) 500 MG 24 hr tablet Take 1 tablet (500 mg total) by mouth daily with breakfast. 30 tablet 3  . metoprolol succinate (TOPROL-XL) 25 MG 24 hr tablet Take 1 tablet (25 mg total) by mouth daily. 90 tablet 3  . OneTouch Delica Lancets 51O MISC Four times daily testing  dx e11.65 150 each 5  . potassium chloride SA (KLOR-CON) 20 MEQ tablet Take 2 tablets (40 mEq total) by mouth daily. 90 tablet 3  . rosuvastatin (CRESTOR) 5 MG tablet Take 1 tablet (5 mg total) by  mouth every evening. 30 tablet 3  . spironolactone (ALDACTONE) 25 MG tablet Take 0.5 tablets (12.5 mg total) by mouth daily. 15 tablet 3  . tamsulosin (FLOMAX) 0.4 MG CAPS capsule Take 1 capsule (0.4 mg total) by mouth daily after supper. 30 capsule 3  . Vitamin D, Ergocalciferol, (DRISDOL) 1.25 MG (50000 UNIT) CAPS capsule Take 1 capsule (50,000 Units total) by mouth every 7  (seven) days. 5 capsule 2   No current facility-administered medications for this visit.     Musculoskeletal: Strength & Muscle Tone: within normal limits Gait & Station: normal Patient leans: N/A  Psychiatric Specialty Exam: Review of Systems  Psychiatric/Behavioral: The patient is nervous/anxious.   All other systems reviewed and are negative.   There were no vitals taken for this visit.There is no height or weight on file to calculate BMI.  General Appearance: NA  Eye Contact:  NA  Speech:  Clear and Coherent  Volume:  Normal  Mood:  Anxious and Euthymic  Affect:  NA  Thought Process:  Goal Directed  Orientation:  Full (Time, Place, and Person)  Thought Content: Rumination   Suicidal Thoughts:  No  Homicidal Thoughts:  No  Memory:  Immediate;   Good Recent;   Good Remote;   Fair  Judgement:  Good  Insight:  Fair  Psychomotor Activity:  Normal  Concentration:  Concentration: Good and Attention Span: Good  Recall:  Good  Fund of Knowledge: Good  Language: Good  Akathisia:  No  Handed:  Right  AIMS (if indicated): not done  Assets:  Communication Skills Desire for Improvement Resilience Social Support Talents/Skills  ADL's:  Intact  Cognition: WNL  Sleep:  Fair   Screenings: Mini-Mental     Office Visit from 09/08/2019 in [facility]  Total Score (max 30 points ) 28    PHQ2-9     Video Visit from 01/12/2020 in [facility] Office Visit from 08/10/2019 in [facility]  PHQ-2 Total Score 0 0       Assessment and Plan: This patient is a 32 patient is a 64 year old male with a history of anxiety.  He has been off imipramine for several months.  Since I last saw him a year ago he has developed significant congestive heart failure which is now treated as well as type II diabetes which is poorly controlled.  I will send in the imipramine 50 mg at bedtime with the caveat that I will contact his cardiologist regarding this.  He will  return to see me in 3 months   Stanley Spiller, MD 02/01/2020, 11:28 AM

## 2020-02-01 NOTE — Progress Notes (Signed)
Virtual Visit via Telephone Note  I connected with Stanley Levy on 02/01/20 at 11:10 AM EDT by telephone and verified that I am speaking with the correct person using two identifiers.   I discussed the limitations, risks, security and privacy concerns of performing an evaluation and management service by telephone and the availability of in person appointments. I also discussed with the patient that there may be a patient responsible charge related to this service. The patient expressed understanding and agreed to proceed.    I discussed the assessment and treatment plan with the patient. The patient was provided an opportunity to ask questions and all were answered. The patient agreed with the plan and demonstrated an understanding of the instructions.   The patient was advised to call back or seek an in-person evaluation if the symptoms worsen or if the condition fails to improve as anticipated.  I provided 15 minutes of non-face-to-face time during this encounter. Patient: Provider Home, patient home  Stanley Spiller, MD  [facility] MD/PA/NP OP Progress Note  02/01/2020 3:09 PM Stanley Levy  MRN:  [id number]  Chief Complaint:  Chief Complaint    Anxiety; Depression; Follow-up     HPI:  This patient is a 64 year old married white male who lives with his wife in [place]. They have no children. 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 He began having recurrent panic attacks. His family doctor referred him to a psychiatrist in [place] who started him on imipramine and Xanax. After a while he no longer needed the Xanax but the imipramine has kept the panic attacks at [place]. His mood is good and he is very active at work and he also goes to the gym to lift weights. He takes very good care of his health. He only has elevated cholesterol and mild hypothyroidism for his health problems..    The patient returns for follow-up after more than a year.  In the intervening time he has had numerous health issues emerge.  He was hospitalized several times for congestive heart failure.  He is on several cardiac medications now.  He also has type 2 diabetes which got very much out of control.  His most recent A1c is 12.9.  The sugars have been into the 4 and 500s.  He is going to various doctors for all of these issues.  He states that he has been off the imipramine several months and notes that he has been more snappy irritable and anxious.  He is not sleeping as well.  He denies severe depression or suicidal ideation.  He states that he "feels good" and is no longer having any chest pain.  He is on a diabetic diet and taking all of his medications faithfully.  He would like to go back on the imipramine.  I think this is okay but because it can have some cardiac implications I have mentioned this to his cardiologist. Visit Diagnosis:    ICD-10-CM   1. Generalized anxiety disorder  F41.1 DISCONTINUED: imipramine (TOFRANIL) 50 MG tablet    Past Psychiatric History: No inpatient admissions, long history of anxiety disorder  Past Medical History:  Past Medical History:  Diagnosis Date  . Anxiety   . Chronic combined systolic and diastolic CHF (congestive heart failure) (HCC)    a. EF 20% with global HK by echo in 08/2019. Also noted to have  LV mural thrombus.   . Diabetes mellitus, type II ([place])   . Foot ulcer ([place])    a. normal ABIs in 2021.  Marland Kitchen History of noncompliance with medical treatment   . Hyperlipidemia   . Hypothyroidism   . LV (left ventricular) mural thrombus   . NICM (nonischemic cardiomyopathy) ([place])   . Nonproliferative diabetic retinopathy ([place]) 12/27/2019  . Subacute confusional state 08/01/2019  . Thyroid disease     Past Surgical History:  Procedure Laterality Date  . FOOT SURGERY    . RIGHT/LEFT HEART CATH AND CORONARY ANGIOGRAPHY N/A 09/20/2019   Procedure:  RIGHT/LEFT HEART CATH AND CORONARY ANGIOGRAPHY;  Surgeon: Leonie Man, MD;  Location: [facility];  Service: Cardiovascular;  Laterality: N/A;    Family Psychiatric History: none  Family History:  Family History  Problem Relation Age of Onset  . Heart attack Father        Near Age 37  . Anxiety disorder Neg Hx   . Dementia Neg Hx   . Alcohol abuse Neg Hx   . Drug abuse Neg Hx   . ADD / ADHD Neg Hx   . Bipolar disorder Neg Hx   . Depression Neg Hx   . OCD Neg Hx   . Paranoid behavior Neg Hx   . Physical abuse Neg Hx   . Schizophrenia Neg Hx   . Seizures Neg Hx   . Sexual abuse Neg Hx     Social History:  Social History   Socioeconomic History  . Marital status: Married    Spouse name: Glennie Rodda   . Number of children: Not on file  . Years of education: Not on file  . Highest education level: High school graduate  Occupational History  . Not on file  Tobacco Use  . Smoking status: Never Smoker  . Smokeless tobacco: Never Used  Vaping Use  . Vaping Use: Never used  Substance and Sexual Activity  . Alcohol use: No  . Drug use: No  . Sexual activity: Yes  Other Topics Concern  . Not on file  Social History Narrative   Recent left working at [employer] after getting sick in start of February      Lives with wife [name]- married 71 (he is her caregiver, she has had strokes)    No children      Beagle: Lorre Nick       Enjoys: weight lift, yard work, Oceanographer, read-       Diet: increasing veggies, was drinking chocolate milk, root beer, and sweet tea, fast food   Caffeine: sweet tea-3 cups of more daily    Water: 8 cups daily       Wears seat belt    Smoke detectors    Does not use phone while driving    Social Determinants of Health   Financial Resource Strain: Low Risk   . Difficulty of Paying Living Expenses: Not hard at all  Food Insecurity: No Food Insecurity  . Worried About Charity fundraiser in the Last Year: Never true  . Ran Out of Food in  the Last Year: Never true  Transportation Needs: No Transportation Needs  . Lack of Transportation (Medical): No  . Lack of Transportation (Non-Medical): No  Physical Activity: Inactive  . Days of Exercise per Week: 0 days  . Minutes of Exercise per Session: 0 min  Stress: No Stress Concern Present  . Feeling of Stress : Not at all  Social  Connections: Moderately Integrated  . Frequency of Communication with Friends and Family: Twice a week  . Frequency of Social Gatherings with Friends and Family: Twice a week  . Attends Religious Services: 1 to 4 times per year  . Active Member of Clubs or Organizations: No  . Attends Archivist Meetings: Never  . Marital Status: Married    Allergies: No Known Allergies  Metabolic Disorder Labs: Lab Results  Component Value Date   HGBA1C 12.9 (A) 01/12/2020   HGBA1C 12.9 01/12/2020   HGBA1C 12.9 (A) 01/12/2020   HGBA1C 12.9 (A) 01/12/2020   MPG NOT CALCULATED 08/01/2019   MPG 146 (H) 05/29/2011   No results found for: PROLACTIN Lab Results  Component Value Date   CHOL 168 08/11/2019   TRIG 185 (H) 08/11/2019   HDL 56 08/11/2019   CHOLHDL 3.0 08/11/2019   LDLCALC 84 08/11/2019   Lab Results  Component Value Date   TSH 11.88 (H) 01/12/2020   TSH 8.634 (H) 10/03/2019    Therapeutic Level Labs: No results found for: LITHIUM No results found for: VALPROATE No components found for:  CBMZ  Current Medications: Current Outpatient Medications  Medication Sig Dispense Refill  . acetaminophen (TYLENOL) 325 MG tablet Take 2 tablets (650 mg total) by mouth every 4 (four) hours as needed for headache or mild pain. 12 tablet 0  . apixaban (ELIQUIS) 5 MG TABS tablet Take 1 tablet (5 mg total) by mouth 2 (two) times daily. 60 tablet 3  . blood glucose meter kit and supplies Dispense based on patient and insurance preference. Use up to four times daily as directed. (FOR ICD-10 E10.9, E11.9). 1 each 0  . digoxin (LANOXIN) 0.125 MG  tablet Take 1 tablet (0.125 mg total) by mouth daily. 30 tablet 3  . escitalopram (LEXAPRO) 10 MG tablet Take 1 tablet (10 mg total) by mouth daily. 30 tablet 2  . furosemide (LASIX) 40 MG tablet Take 1 tablet (40 mg total) by mouth 2 (two) times daily. 180 tablet 3  . gabapentin (NEURONTIN) 100 MG capsule Take 1 capsule (100 mg total) by mouth 3 (three) times daily. 90 capsule 2  . glucose blood (GLUCOSE METER TEST) test strip Use as instructed 400 each 3  . insulin aspart protamine - aspart (NOVOLOG MIX 70/30 FLEXPEN) (70-30) 100 UNIT/ML FlexPen Inject 0.2 mLs (20 Units total) into the skin 2 (two) times daily with a meal. 5 pen 2  . Insulin Pen Needle 31G X 5 MM MISC 1 Units by Does not apply route 2 (two) times daily. 100 each 5  . Lancets 30G MISC Check BG twice daily, once before breakfast and once before supper. 50 each 11  . levothyroxine (SYNTHROID) 50 MCG tablet Take 1 tablet (50 mcg total) by mouth daily before breakfast. 90 tablet 1  . losartan (COZAAR) 25 MG tablet Take 0.5 tablets (12.5 mg total) by mouth daily. 90 tablet 3  . metFORMIN (GLUCOPHAGE XR) 500 MG 24 hr tablet Take 1 tablet (500 mg total) by mouth daily with breakfast. 30 tablet 3  . metoprolol succinate (TOPROL-XL) 25 MG 24 hr tablet Take 1 tablet (25 mg total) by mouth daily. 90 tablet 3  . OneTouch Delica Lancets 10G MISC Four times daily testing  dx e11.65 150 each 5  . potassium chloride SA (KLOR-CON) 20 MEQ tablet Take 2 tablets (40 mEq total) by mouth daily. 90 tablet 3  . rosuvastatin (CRESTOR) 5 MG tablet Take 1 tablet (5 mg total) by  mouth every evening. 30 tablet 3  . spironolactone (ALDACTONE) 25 MG tablet Take 0.5 tablets (12.5 mg total) by mouth daily. 15 tablet 3  . tamsulosin (FLOMAX) 0.4 MG CAPS capsule Take 1 capsule (0.4 mg total) by mouth daily after supper. 30 capsule 3  . Vitamin D, Ergocalciferol, (DRISDOL) 1.25 MG (50000 UNIT) CAPS capsule Take 1 capsule (50,000 Units total) by mouth every 7 (seven)  days. 5 capsule 2   No current facility-administered medications for this visit.     Musculoskeletal: Strength & Muscle Tone: within normal limits Gait & Station: normal Patient leans: N/A  Psychiatric Specialty Exam: Review of Systems  Psychiatric/Behavioral: The patient is nervous/anxious.   All other systems reviewed and are negative.   There were no vitals taken for this visit.There is no height or weight on file to calculate BMI.  General Appearance: NA  Eye Contact:  NA  Speech:  Clear and Coherent  Volume:  Normal  Mood:  Anxious and Euthymic  Affect:  NA  Thought Process:  Goal Directed  Orientation:  Full (Time, Place, and Person)  Thought Content: Rumination   Suicidal Thoughts:  No  Homicidal Thoughts:  No  Memory:  Immediate;   Good Recent;   Good Remote;   Fair  Judgement:  Good  Insight:  Fair  Psychomotor Activity:  Normal  Concentration:  Concentration: Good and Attention Span: Good  Recall:  Good  Fund of Knowledge: Good  Language: Good  Akathisia:  No  Handed:  Right  AIMS (if indicated): not done  Assets:  Communication Skills Desire for Improvement Resilience Social Support Talents/Skills  ADL's:  Intact  Cognition: WNL  Sleep:  Fair   Screenings: Mini-Mental     Office Visit from 09/08/2019 in [facility]  Total Score (max 30 points ) 28    PHQ2-9     Video Visit from 01/12/2020 in [facility] Office Visit from 08/10/2019 in [facility]  PHQ-2 Total Score 0 0       Assessment and Plan: This patient is a 67 patient is a 64 year old male with a history of anxiety.  He has been off imipramine for several months.  Since I last saw him a year ago he has developed significant congestive heart failure which is now treated as well as type II diabetes which is poorly controlled.  I will send in the imipramine 50 mg at bedtime with the caveat that I will contact his cardiologist regarding this.  He will return  to see me in 3 months  Addendum: Cardiology was consulted and suggested that imipramine would not be safe given his recent cardiac conditions and medications.  Therefore Lexapro 10 mg daily will be substituted.  Stanley Spiller, MD 02/01/2020, 3:09 PM

## 2020-03-05 NOTE — Progress Notes (Deleted)
Cardiology Office Note   Date:  03/05/2020   ID:  Knolan, Simien 04-06-1956, MRN [id number]  PCP:  Perlie Mayo, NP  Cardiologist:  KO    No chief complaint on file.     History of Present Illness: Stanley Levy is a 64 y.o. male who presents for *** PMH of HTN, HLD, DM II, medical noncompliance and h/o CVA admitted from cardiology office for acute on chronic systolic CHF. He was recently diagnosed with new cardiomyopathy with EF 20% and large LV mural thrombus in Feb. Eliquis was started at that time. Did not consider coumadin due to h/o noncompliance. Readmitted from office 09/13/19 with acute CHF L&RHC.  Cath with NICM, + LV thrombus on eliquis , hx of CVA  uncontrolled on insulin,  Hypothyroidism with TSH to 15 on synthroid    Today we reviewed reasons for hospitalization -he stated he understood on review.  No SOB, no Chest pain, not eating salt and his wife agrees - she monitors his glucose and salt.  His BP is soft today but no lightheadedness or dizziness. No palpitations.  Has not missed meds, though when his glucose came back at 575 I called him back and he had not taken insulin today.   His PCP appt is next week.    Lower ext dopplers good blood flow  Past Medical History:  Diagnosis Date  . Anxiety   . Chronic combined systolic and diastolic CHF (congestive heart failure) (HCC)    a. EF 20% with global HK by echo in 08/2019. Also noted to have LV mural thrombus.   . Diabetes mellitus, type II ([place])   . Foot ulcer ([place])    a. normal ABIs in 2021.  Marland Kitchen History of noncompliance with medical treatment   . Hyperlipidemia   . Hypothyroidism   . LV (left ventricular) mural thrombus   . NICM (nonischemic cardiomyopathy) ([place])   . Nonproliferative diabetic retinopathy ([place]) 12/27/2019  . Subacute confusional state 08/01/2019  . Thyroid disease     Past Surgical History:  Procedure Laterality Date  . FOOT SURGERY    . RIGHT/LEFT HEART CATH AND CORONARY  ANGIOGRAPHY N/A 09/20/2019   Procedure: RIGHT/LEFT HEART CATH AND CORONARY ANGIOGRAPHY;  Surgeon: Leonie Man, MD;  Location: [facility];  Service: Cardiovascular;  Laterality: N/A;     Current Outpatient Medications  Medication Sig Dispense Refill  . acetaminophen (TYLENOL) 325 MG tablet Take 2 tablets (650 mg total) by mouth every 4 (four) hours as needed for headache or mild pain. 12 tablet 0  . apixaban (ELIQUIS) 5 MG TABS tablet Take 1 tablet (5 mg total) by mouth 2 (two) times daily. 60 tablet 3  . blood glucose meter kit and supplies Dispense based on patient and insurance preference. Use up to four times daily as directed. (FOR ICD-10 E10.9, E11.9). 1 each 0  . digoxin (LANOXIN) 0.125 MG tablet Take 1 tablet (0.125 mg total) by mouth daily. 30 tablet 3  . escitalopram (LEXAPRO) 10 MG tablet Take 1 tablet (10 mg total) by mouth daily. 30 tablet 2  . furosemide (LASIX) 40 MG tablet Take 1 tablet (40 mg total) by mouth 2 (two) times daily. 180 tablet 3  . gabapentin (NEURONTIN) 100 MG capsule Take 1 capsule (100 mg total) by mouth 3 (three) times daily. 90 capsule 2  . glucose blood (GLUCOSE METER TEST) test strip Use as instructed 400 each 3  . insulin aspart protamine - aspart (  NOVOLOG MIX 70/30 FLEXPEN) (70-30) 100 UNIT/ML FlexPen Inject 0.2 mLs (20 Units total) into the skin 2 (two) times daily with a meal. 5 pen 2  . Insulin Pen Needle 31G X 5 MM MISC 1 Units by Does not apply route 2 (two) times daily. 100 each 5  . Lancets 30G MISC Check BG twice daily, once before breakfast and once before supper. 50 each 11  . levothyroxine (SYNTHROID) 50 MCG tablet Take 1 tablet (50 mcg total) by mouth daily before breakfast. 90 tablet 1  . losartan (COZAAR) 25 MG tablet Take 0.5 tablets (12.5 mg total) by mouth daily. 90 tablet 3  . metFORMIN (GLUCOPHAGE XR) 500 MG 24 hr tablet Take 1 tablet (500 mg total) by mouth daily with breakfast. 30 tablet 3  . metoprolol succinate (TOPROL-XL)  25 MG 24 hr tablet Take 1 tablet (25 mg total) by mouth daily. 90 tablet 3  . OneTouch Delica Lancets 29W MISC Four times daily testing  dx e11.65 150 each 5  . potassium chloride SA (KLOR-CON) 20 MEQ tablet Take 2 tablets (40 mEq total) by mouth daily. 90 tablet 3  . rosuvastatin (CRESTOR) 5 MG tablet Take 1 tablet (5 mg total) by mouth every evening. 30 tablet 3  . spironolactone (ALDACTONE) 25 MG tablet Take 0.5 tablets (12.5 mg total) by mouth daily. 15 tablet 3  . tamsulosin (FLOMAX) 0.4 MG CAPS capsule Take 1 capsule (0.4 mg total) by mouth daily after supper. 30 capsule 3  . Vitamin D, Ergocalciferol, (DRISDOL) 1.25 MG (50000 UNIT) CAPS capsule Take 1 capsule (50,000 Units total) by mouth every 7 (seven) days. 5 capsule 2   No current facility-administered medications for this visit.    Allergies:   Patient has no known allergies.    Social History:  The patient  reports that he has never smoked. He has never used smokeless tobacco. He reports that he does not drink alcohol and does not use drugs.   Family History:  The patient's ***family history includes Heart attack in his father.    ROS:  General:no colds or fevers, no weight changes Skin:no rashes or ulcers HEENT:no blurred vision, no congestion CV:see HPI PUL:see HPI GI:no diarrhea constipation or melena, no indigestion GU:no hematuria, no dysuria MS:no joint pain, no claudication Neuro:no syncope, no lightheadedness Endo:no diabetes, no thyroid disease Wt Readings from Last 3 Encounters:  01/23/20 190 lb (86.2 kg)  01/12/20 187 lb 12.8 oz (85.2 kg)  01/12/20 188 lb (85.3 kg)     PHYSICAL EXAM: VS:  There were no vitals taken for this visit. , BMI There is no height or weight on file to calculate BMI. General:Pleasant affect, NAD Skin:Warm and dry, brisk capillary refill HEENT:normocephalic, sclera clear, mucus membranes moist Neck:supple, no JVD, no bruits  Heart:S1S2 RRR without murmur, gallup, rub or  click Lungs:clear without rales, rhonchi, or wheezes KMQ:KMMN, non tender, + BS, do not palpate liver spleen or masses Ext:no lower ext edema, 2+ pedal pulses, 2+ radial pulses Neuro:alert and oriented, MAE, follows commands, + facial symmetry    EKG:  EKG is ordered today. The ekg ordered today demonstrates ***   Recent Labs: 09/13/2019: B Natriuretic Peptide 1,082.0 09/19/2019: Magnesium 1.9 01/12/2020: ALT 11; BUN 32; Creat 1.07; Hemoglobin 13.9; Platelets 168; Potassium 4.5; Sodium 131; TSH 11.88    Lipid Panel    Component Value Date/Time   CHOL 168 08/11/2019 0859   TRIG 185 (H) 08/11/2019 0859   HDL 56 08/11/2019 0859  CHOLHDL 3.0 08/11/2019 0859   [place] 84 08/11/2019 0859       Other studies Reviewed: Additional studies/ records that were reviewed today include: ***.   ASSESSMENT AND PLAN:  1.  ***   Current medicines are reviewed with the patient today.  The patient Has no concerns regarding medicines.  The following changes have been made:  See above Labs/ tests ordered today include:see above  Disposition:   FU:  see above  Signed, Cecilie Kicks, NP  03/05/2020 8:54 PM    [facility] [place], [place], [place], [place] Phone: [phone]; Fax: [phone]

## 2020-03-06 ENCOUNTER — Ambulatory Visit: Payer: PPO | Admitting: Cardiology

## 2020-03-06 ENCOUNTER — Encounter: Payer: Medicare HMO | Attending: "Endocrinology | Admitting: Nutrition

## 2020-03-06 ENCOUNTER — Encounter: Payer: Self-pay | Admitting: Nutrition

## 2020-03-06 ENCOUNTER — Other Ambulatory Visit: Payer: Self-pay

## 2020-03-06 VITALS — Ht 69.0 in | Wt 191.6 lb

## 2020-03-06 DIAGNOSIS — E1165 Type 2 diabetes mellitus with hyperglycemia: Secondary | ICD-10-CM | POA: Diagnosis not present

## 2020-03-06 DIAGNOSIS — I10 Essential (primary) hypertension: Secondary | ICD-10-CM | POA: Diagnosis not present

## 2020-03-06 DIAGNOSIS — E782 Mixed hyperlipidemia: Secondary | ICD-10-CM | POA: Insufficient documentation

## 2020-03-06 NOTE — Patient Instructions (Signed)
Goals  Eat three meal per day Eat 45-60 g CHO at meals Increase high fiber foods Keep working out Get A1C down to 7%

## 2020-03-06 NOTE — Progress Notes (Signed)
°  Medical Nutrition Therapy:  Appt start time: 1300 end time:  1400.   Assessment:  Primary concerns today: Diabetes Type 2. . Sees Dr. Fransico Him, [facility]. LIves with his wife. Has had it since he was in his  20's.  70/30 insulin 20 units twice a day;before breakfast and before dinner. Testing blood sugars twice a day. Metformin 500 mg after breakfast. He has cut out cakes, ice cream, pies and desserts. BS are much improved. Drinking mostly water. Physical activity 3 x times per week. Walks 8 miles 3-4 time per week. FBS: 120's Before dinner 140's. Feels a lot better. Eating three meals per day. No low blood sugars. Lab Results  Component Value Date   HGBA1C 12.9 (A) 01/12/2020   HGBA1C 12.9 01/12/2020   HGBA1C 12.9 (A) 01/12/2020   HGBA1C 12.9 (A) 01/12/2020   CMP Latest Ref Rng & Units 01/12/2020 10/03/2019 09/22/2019  Glucose 65 - 139 mg/dL 092(H) 574(BB) 403(J)  BUN 7 - 25 mg/dL 09(U) 21 21  Creatinine 0.70 - 1.25 mg/dL 4.38 3.81 8.40  Sodium 135 - 146 mmol/L 131(L) 135 136  Potassium 3.5 - 5.3 mmol/L 4.5 4.2 4.3  Chloride 98 - 110 mmol/L 96(L) 96(L) 99  CO2 20 - 32 mmol/L 25 29 27   Calcium 8.6 - 10.3 mg/dL 9.5 9.0 9.0  Total Protein 6.1 - 8.1 g/dL 6.4 - -  Total Bilirubin 0.2 - 1.2 mg/dL 0.5 - -  Alkaline Phos 38 - 126 U/L - - -  AST 10 - 35 U/L 11 - -  ALT 9 - 46 U/L 11 - -       Preferred Learning Style:   No preference indicated   Learning Readiness:   Ready  Change in progress   MEDICATIONS:    DIETARY INTAKE:   24-hr recall:  B ( AM): Egg whites and water Snk ( AM):   L ( PM): Black eyed peas, corn, turnip greens, cabbage, water Snk ( PM):  D ( PM): Hamburger steak, peas/carrots, water Snk ( PM):  Beverages: water  Usual physical activity: walks and works out a lot  Estimated energy needs: 2000  calories 225 g carbohydrates 150  g protein 56  g fat  Progress Towards Goal(s):  In progress.   Nutritional Diagnosis:  NB-1.1  Food and nutrition-related knowledge deficit As related to Diabetes Type 2.  As evidenced by A1C 12.9%.    Intervention:  Nutrition and Diabetes education provided on My Plate, CHO counting, meal planning, portion sizes, timing of meals, avoiding snacks between meals unless having a low blood sugar, target ranges for A1C and blood sugars, signs/symptoms and treatment of hyper/hypoglycemia, monitoring blood sugars, taking medications as prescribed, benefits of exercising 30 minutes per day and prevention of complications of DM.  Goals  Eat three meal per day Eat 45-60 g CHO at meals Increase high fiber foods Keep working out Get A1C down to 7%  Teaching Method Utilized:  Visual Auditory Hands on  Handouts given during visit include:  The Plate Method   Meal Plan Card  Diabetes Instructions.   Barriers to learning/adherence to lifestyle change: none  Demonstrated degree of understanding via:  Teach Back   Monitoring/Evaluation:  Dietary intake, exercise,  and body weight in 3 month(s).

## 2020-03-12 NOTE — Progress Notes (Signed)
Cardiology Office Note  Date: 03/13/2020   ID: Khyan, Oats 1955/07/04, MRN [id number]  PCP:  Perlie Mayo, NP  Cardiologist:  Kate Sable, MD (Inactive) Electrophysiologist:  None   Chief Complaint: follow up LV mural thrombus  History of Present Illness: Stanley Levy is a 64 y.o. male with a history of LV mural thrombus, HTN, HLD, DM II uncontrolled on insulin, medical non-compliance, acute on chronic systolic HF, hx of CVA, NICM, hypothyroidism.  Last office visit 10/03/2019 with Cecilie Kicks NP for hospitalization follow-up for CHF/cardiomyopathy.  He had recently been diagnosed with new cardiomyopathy with EF of 20% and large LV thrombus in February 2021.  Eliquis was started at that time.  Was readmitted from office on 09/13/2019 with acute CHF with left and right heart cath.  Cardiac catheterization noted nonischemic cardiomyopathy plus LV thrombus on Eliquis.  History of CVA.  Uncontrolled diabetes on insulin.  Hypothyroidism with TSH of 15.  He denied any shortness of breath, chest pain.  He was not eating salt.  Blood pressure was slightly low but no lightheadedness or dizziness.  No palpitations.  His glucose came back at 575.  He had not taken his insulin that day.  Patient is here for follow-up.  He was last seen by Cecilie Kicks on 10/03/2019 for follow-up secondary to CHF/nonischemic cardiomyopathy.  He states he has been doing exceptionally well.  He has been walking 7 miles almost daily.  Also exercising.  States he is feeling excellent denies any anginal or exertional symptoms, palpitations or arrhythmias, orthostatic symptoms, CVA or TIA-like symptoms, PND, orthopnea, bleeding, claudication, DVT or PE-like symptoms, or lower extremity edema.  He states his diabetes and hypothyroidism are being managed much better he is seeing Dr. Dorris Fetch in [place].  He is compliant with all his medications and there have been no interim changes.  Past Medical History:  Diagnosis  Date  . Anxiety   . Chronic combined systolic and diastolic CHF (congestive heart failure) (HCC)    a. EF 20% with global HK by echo in 08/2019. Also noted to have LV mural thrombus.   . Diabetes mellitus, type II ([place])   . Foot ulcer ([place])    a. normal ABIs in 2021.  Marland Kitchen History of noncompliance with medical treatment   . Hyperlipidemia   . Hypothyroidism   . LV (left ventricular) mural thrombus   . NICM (nonischemic cardiomyopathy) ([place])   . Nonproliferative diabetic retinopathy ([place]) 12/27/2019  . Subacute confusional state 08/01/2019  . Thyroid disease     Past Surgical History:  Procedure Laterality Date  . FOOT SURGERY    . RIGHT/LEFT HEART CATH AND CORONARY ANGIOGRAPHY N/A 09/20/2019   Procedure: RIGHT/LEFT HEART CATH AND CORONARY ANGIOGRAPHY;  Surgeon: Leonie Man, MD;  Location: [facility];  Service: Cardiovascular;  Laterality: N/A;    Current Outpatient Medications  Medication Sig Dispense Refill  . acetaminophen (TYLENOL) 325 MG tablet Take 2 tablets (650 mg total) by mouth every 4 (four) hours as needed for headache or mild pain. 12 tablet 0  . apixaban (ELIQUIS) 5 MG TABS tablet Take 1 tablet (5 mg total) by mouth 2 (two) times daily. 60 tablet 3  . blood glucose meter kit and supplies Dispense based on patient and insurance preference. Use up to four times daily as directed. (FOR ICD-10 E10.9, E11.9). 1 each 0  . digoxin (LANOXIN) 0.125 MG tablet Take 1 tablet (0.125 mg total) by mouth daily. [place]  tablet 3  . escitalopram (LEXAPRO) 10 MG tablet Take 1 tablet (10 mg total) by mouth daily. 30 tablet 2  . furosemide (LASIX) 40 MG tablet Take 1 tablet (40 mg total) by mouth 2 (two) times daily. 180 tablet 3  . gabapentin (NEURONTIN) 100 MG capsule Take 1 capsule (100 mg total) by mouth 3 (three) times daily. 90 capsule 2  . glucose blood (GLUCOSE METER TEST) test strip Use as instructed 400 each 3  . insulin aspart protamine - aspart (NOVOLOG MIX 70/30 FLEXPEN)  (70-30) 100 UNIT/ML FlexPen Inject 0.2 mLs (20 Units total) into the skin 2 (two) times daily with a meal. 5 pen 2  . Insulin Pen Needle 31G X 5 MM MISC 1 Units by Does not apply route 2 (two) times daily. 100 each 5  . Lancets 30G MISC Check BG twice daily, once before breakfast and once before supper. 50 each 11  . levothyroxine (SYNTHROID) 50 MCG tablet Take 1 tablet (50 mcg total) by mouth daily before breakfast. 90 tablet 1  . losartan (COZAAR) 25 MG tablet Take 0.5 tablets (12.5 mg total) by mouth daily. 90 tablet 3  . metFORMIN (GLUCOPHAGE XR) 500 MG 24 hr tablet Take 1 tablet (500 mg total) by mouth daily with breakfast. 30 tablet 3  . metoprolol succinate (TOPROL-XL) 25 MG 24 hr tablet Take 1 tablet (25 mg total) by mouth daily. 90 tablet 3  . OneTouch Delica Lancets 76E MISC Four times daily testing  dx e11.65 150 each 5  . potassium chloride SA (KLOR-CON) 20 MEQ tablet Take 2 tablets (40 mEq total) by mouth daily. 90 tablet 3  . rosuvastatin (CRESTOR) 5 MG tablet Take 1 tablet (5 mg total) by mouth every evening. 30 tablet 3  . spironolactone (ALDACTONE) 25 MG tablet Take 0.5 tablets (12.5 mg total) by mouth daily. 15 tablet 3  . tamsulosin (FLOMAX) 0.4 MG CAPS capsule Take 1 capsule (0.4 mg total) by mouth daily after supper. 30 capsule 3  . Vitamin D, Ergocalciferol, (DRISDOL) 1.25 MG (50000 UNIT) CAPS capsule Take 1 capsule (50,000 Units total) by mouth every 7 (seven) days. 5 capsule 2   No current facility-administered medications for this visit.   Allergies:  Patient has no known allergies.   Social History: The patient  reports that he has never smoked. He has never used smokeless tobacco. He reports that he does not drink alcohol and does not use drugs.   Family History: The patient's family history includes Heart attack in his father.   ROS:  Please see the history of present illness. Otherwise, complete review of systems is positive for none.  All other systems are reviewed  and negative.   Physical Exam: VS:  BP 138/78   Pulse 88   Ht 5' 9"  (1.753 m)   Wt 190 lb (86.2 kg)   SpO2 96%   BMI 28.06 kg/m , BMI Body mass index is 28.06 kg/m.  Wt Readings from Last 3 Encounters:  03/13/20 190 lb (86.2 kg)  03/06/20 191 lb 9.6 oz (86.9 kg)  01/23/20 190 lb (86.2 kg)    General: Patient appears comfortable at rest. Neck: Supple, no elevated JVP or carotid bruits, no thyromegaly. Lungs: Clear to auscultation, nonlabored breathing at rest. Cardiac: Regular rate and rhythm, no S3 or significant systolic murmur, no pericardial rub. Extremities: No pitting edema, distal pulses 2+. Skin: Warm and dry. Musculoskeletal: No kyphosis. Neuropsychiatric: Alert and oriented x3, affect grossly appropriate.  ECG:  EKG  September 13, 2019 showed sinus tachycardia with rate of 100, ventricular bigeminy, probable anterior infarct, old.  No significant change since last tracing.  Recent Labwork: 09/13/2019: B Natriuretic Peptide 1,082.0 09/19/2019: Magnesium 1.9 01/12/2020: ALT 11; AST 11; BUN 32; Creat 1.07; Hemoglobin 13.9; Platelets 168; Potassium 4.5; Sodium 131; TSH 11.88     Component Value Date/Time   CHOL 168 08/11/2019 0859   TRIG 185 (H) 08/11/2019 0859   HDL 56 08/11/2019 0859   CHOLHDL 3.0 08/11/2019 0859   LDLCALC 84 08/11/2019 0859    Other Studies Reviewed Today:  Lower extremity arterial doppler 11/14/2019 Right: Resting right ankle-brachial index is within normal range. No evidence of significant right lower extremity arterial disease. The right toe-brachial index is normal. Left: Resting left ankle-brachial index is within normal range. No evidence of significant left lower extremity arterial disease. The left toe-brachial index is normal.    Cardiac cath 09/20/19 CARDIAC CATHETERIZATION  Result Date: 09/20/2019  Hemodynamic findings consistent with moderate pulmonary hypertension.  LV end diastolic pressure is severely elevated.  Angiographically  normal coronary arteries; large draping vessels. Left dominant system SUMMARY  Nonischemic cardiomyopathy: Angiographically normal coronary arteries with Left dominant system  Severe combined systolic and diastolic cardiomyopathy: EF severely reduced by EF, PCWP of 30 with LVEDP of 36. RECOMMENDATIONS  Return to nursing unit for ongoing care.  We will increase Lasix to 80 mg twice daily. 80 mg given in Cath Lab.  Continue to titrate guideline directed medical management for nonischemic cardiomyopathy. Stanley Hew, MD    Echo 08/16/19 1. Left ventricular ejection fraction, by estimation, is 20%. The left  ventricle has severely decreased function. The left ventricle demonstrates  global hypokinesis with regional variation. Reviewed hospital notes and labs, cardiac cath.  2. Large LV mural thrombus in distal anterolateral position,  approximately 2.0 x 2.5 cm (trabecular muscle obscures extent to some  degree). Reported to Dr. Denton Brick.  3. Right ventricular systolic function is moderately reduced. The right  ventricular size is normal. There is mildly elevated pulmonary artery  systolic pressure. The estimated right ventricular systolic pressure is  65.4 mmHg.  4. Left atrial size was mildly dilated.  5. Right atrial size was moderately dilated.  6. The mitral valve is grossly normal. Trivial mitral valve  regurgitation.  7. The aortic valve is tricuspid. Aortic valve regurgitation is not  visualized.  8. The inferior vena cava is normal in size with greater than 50%  respiratory variability, suggesting right atrial pressure of 3 mmHg.     Assessment and Plan:  1. LV (left ventricular) mural thrombus   2. NICM (nonischemic cardiomyopathy) ([place])   3. Hypothyroidism, unspecified type   4. Essential hypertension   5. History of CVA (cerebrovascular accident)   6. Chronic systolic heart failure ([place])     1. LV (left ventricular) mural thrombus Large LV mural thrombus in  distal anterolateral position,  approximately 2.0 x 2.5 cm.  Continues on Eliquis 5 mg p.o. twice daily.  Get a repeat echocardiogram to check for resolution of LV thrombus.  2. NICM (nonischemic cardiomyopathy) ([place]) Echocardiogram February 2020 showed EF 20% Continue BB, digoxin, spironolactone, cozaar.  Repeat echocardiogram to check for improvement in LV function.  Continue digoxin 0.125 mg p.o. daily.  Lasix 40 mg daily.  Continue Toprol-XL 25 mg p.o. daily.  Continue spironolactone 12.5 mg p.o. daily.  3. Hypothyroidism, unspecified type TSH 15.9 and started back on synthroid.  Sees Dr. Dorris Fetch in [place] for hypothyroidism.  TSH  improved as of 2 months ago on 01/12/2020 at 11.88   4. Essential hypertension BP was soft at last visit.  Blood pressure today 138/78.  Continue losartan 12.5 mg p.o. daily.  5. History of CVA (cerebrovascular accident) On eliquis and statin.  No focal neurologic deficit or new CVA.  TIA-like symptoms.  Patient is walking 7 miles daily as well as performing other exercises during the week.  Continue Eliquis 5 mg p.o. twice daily.  Continue Crestor 5 mg p.o. daily.  6. Foot ulcers. Had bilateral foot ulcers.  Lower extremity arterial Dopplers ordered earlier showing no evidence of peripheral arterial disease  Medication Adjustments/Labs and Tests Ordered: Current medicines are reviewed at length with the patient today.  Concerns regarding medicines are outlined above.   Disposition: Follow-up with Dr. Harl Bowie or APP 6 months.  Signed, Levell July, NP 03/13/2020 8:39 AM    [facility] [place], [place],  [address] Phone: [phone]; Fax: [phone]

## 2020-03-13 ENCOUNTER — Ambulatory Visit: Payer: Medicare HMO | Admitting: Family Medicine

## 2020-03-13 ENCOUNTER — Encounter: Payer: Self-pay | Admitting: Family Medicine

## 2020-03-13 ENCOUNTER — Other Ambulatory Visit: Payer: Self-pay

## 2020-03-13 VITALS — BP 138/78 | HR 88 | Ht 69.0 in | Wt 190.0 lb

## 2020-03-13 DIAGNOSIS — Z8673 Personal history of transient ischemic attack (TIA), and cerebral infarction without residual deficits: Secondary | ICD-10-CM

## 2020-03-13 DIAGNOSIS — I10 Essential (primary) hypertension: Secondary | ICD-10-CM | POA: Diagnosis not present

## 2020-03-13 DIAGNOSIS — I428 Other cardiomyopathies: Secondary | ICD-10-CM

## 2020-03-13 DIAGNOSIS — E039 Hypothyroidism, unspecified: Secondary | ICD-10-CM

## 2020-03-13 DIAGNOSIS — I5022 Chronic systolic (congestive) heart failure: Secondary | ICD-10-CM | POA: Diagnosis not present

## 2020-03-13 DIAGNOSIS — I513 Intracardiac thrombosis, not elsewhere classified: Secondary | ICD-10-CM

## 2020-03-13 NOTE — Patient Instructions (Signed)

## 2020-03-14 ENCOUNTER — Ambulatory Visit (INDEPENDENT_AMBULATORY_CARE_PROVIDER_SITE_OTHER): Payer: Medicare HMO

## 2020-03-14 DIAGNOSIS — I428 Other cardiomyopathies: Secondary | ICD-10-CM

## 2020-03-14 DIAGNOSIS — I5022 Chronic systolic (congestive) heart failure: Secondary | ICD-10-CM

## 2020-03-14 LAB — ECHOCARDIOGRAM COMPLETE
Area-P 1/2: 6.12 cm2
Calc EF: 24.1 %
MV M vel: 2.22 m/s
MV Peak grad: 19.6 mmHg
P 1/2 time: 1249 msec
S' Lateral: 5.02 cm
Single Plane A2C EF: 25.9 %
Single Plane A4C EF: 21.4 %

## 2020-03-19 ENCOUNTER — Telehealth: Payer: Self-pay | Admitting: *Deleted

## 2020-03-19 DIAGNOSIS — Z79899 Other long term (current) drug therapy: Secondary | ICD-10-CM

## 2020-03-19 DIAGNOSIS — I428 Other cardiomyopathies: Secondary | ICD-10-CM

## 2020-03-19 NOTE — Telephone Encounter (Signed)
-----   Message from Netta Neat., NP sent at 03/14/2020  7:30 PM EDT ----- Please call the patient and tell him the pumping function of his heart has increased a little to 25 % from 20% in February. Tell him there is a medication we can start him on called Entresto that would likely help improved his pumping function over time. Tell him if we start the medication he will have to stop the Losartan. The losartan must be stopped at least 36 hours before he starts entresto. This is called a washout period. After the washout period he would started the entresto. The initial dose of Entresto would be 24/26 mg po bid. Tell him once he starts the entresto he cannot re-start the losartan. He will also need to have BMET and MAG in two weeks after starting. After he starts the [facility] he will likely have another echocardiogram 6-12 months afterward to recheck his pumping function. Tell him if he chooses to start this medication it is very important to take it as ordered and not skip doses.It is also very important to take all other medications but do not restart the losartan.

## 2020-03-28 ENCOUNTER — Encounter: Payer: Self-pay | Admitting: *Deleted

## 2020-03-28 NOTE — Telephone Encounter (Signed)
Letter mailed to home address after several failed attempts to reach patient by phone.

## 2020-04-04 MED ORDER — ENTRESTO 24-26 MG PO TABS: 1.0000 | ORAL_TABLET | Freq: Two times a day (BID) | ORAL | 6 refills | Status: DC

## 2020-04-04 NOTE — Addendum Note (Signed)
Addended by: Eustace Moore on: 04/04/2020 01:27 PM   Modules accepted: Orders

## 2020-04-04 NOTE — Telephone Encounter (Signed)
Patient informed and verbalized understanding of plan. [email or web address] number given to patient to call to get code to get first month free. Copy sent to PCP. Lab order [employer] or [employer]

## 2020-04-13 ENCOUNTER — Ambulatory Visit: Payer: PPO | Admitting: Family Medicine

## 2020-04-19 ENCOUNTER — Encounter: Payer: Self-pay | Admitting: Family Medicine

## 2020-04-19 ENCOUNTER — Ambulatory Visit (INDEPENDENT_AMBULATORY_CARE_PROVIDER_SITE_OTHER): Payer: Medicare HMO | Admitting: Family Medicine

## 2020-04-19 ENCOUNTER — Other Ambulatory Visit: Payer: Self-pay

## 2020-04-19 VITALS — BP 132/76 | HR 86 | Resp 16 | Ht 69.0 in | Wt 192.0 lb

## 2020-04-19 DIAGNOSIS — I10 Essential (primary) hypertension: Secondary | ICD-10-CM | POA: Diagnosis not present

## 2020-04-19 DIAGNOSIS — Z6828 Body mass index (BMI) 28.0-28.9, adult: Secondary | ICD-10-CM | POA: Diagnosis not present

## 2020-04-19 DIAGNOSIS — E663 Overweight: Secondary | ICD-10-CM | POA: Diagnosis not present

## 2020-04-19 DIAGNOSIS — E1165 Type 2 diabetes mellitus with hyperglycemia: Secondary | ICD-10-CM

## 2020-04-19 NOTE — Patient Instructions (Addendum)
HAPPY FALL!  I appreciate the opportunity to provide you with care for your health and wellness. Today we discussed: diabetes  Follow up: Feb for CPE -fasting appt   No labs or referrals today A1c today _9.5%_____   Dr Fransico Him might want to adjust your meds at your next appt, but this is improvement!  Continue to work on diet to avoid foods that will rise your blood sugar levels. Review the information below to help you with food choices.  Please continue to practice social distancing to keep you, your family, and our community safe.  If you must go out, please wear a mask and practice good handwashing.  It was a pleasure to see you and I look forward to continuing to work together on your health and well-being. Please do not hesitate to call the office if you need care or have questions about your care.  Have a wonderful day and week. With Gratitude, Tereasa Coop, DNP, AGNP-BC  How do carbohydrates affect me? Carbohydrates, also called carbs, affect your blood glucose level more than any other type of food. Eating carbs naturally raises the amount of glucose in your blood. Carb counting is a method for keeping track of how many carbs you eat. Counting carbs is important to keep your blood glucose at a healthy level, especially if you use insulin or take certain oral diabetes medicines. It is important to know how many carbs you can safely have in each meal. This is different for every person. Your dietitian can help you calculate how many carbs you should have at each meal and for each snack. Foods that contain carbs include:  Bread, cereal, rice, pasta, and crackers.  Potatoes and corn.  Peas, beans, and lentils.  Milk and yogurt.  Fruit and juice.  Desserts, such as cakes, cookies, ice cream, and candy. How does alcohol affect me? Alcohol can cause a sudden decrease in blood glucose (hypoglycemia), especially if you use insulin or take certain oral diabetes medicines.  Hypoglycemia can be a life-threatening condition. Symptoms of hypoglycemia (sleepiness, dizziness, and confusion) are similar to symptoms of having too much alcohol. If your health care provider says that alcohol is safe for you, follow these guidelines:  Limit alcohol intake to no more than 1 drink per day for nonpregnant women and 2 drinks per day for men. One drink equals 12 oz of beer, 5 oz of wine, or 1 oz of hard liquor.  Do not drink on an empty stomach.  Keep yourself hydrated with water, diet soda, or unsweetened iced tea.  Keep in mind that regular soda, juice, and other mixers may contain a lot of sugar and must be counted as carbs. What are tips for following this plan?  Reading food labels  Start by checking the serving size on the "Nutrition Facts" label of packaged foods and drinks. The amount of calories, carbs, fats, and other nutrients listed on the label is based on one serving of the item. Many items contain more than one serving per package.  Check the total grams (g) of carbs in one serving. You can calculate the number of servings of carbs in one serving by dividing the total carbs by 15. For example, if a food has 30 g of total carbs, it would be equal to 2 servings of carbs.  Check the number of grams (g) of saturated and trans fats in one serving. Choose foods that have low or no amount of these fats.  Check the number  of milligrams (mg) of salt (sodium) in one serving. Most people should limit total sodium intake to less than 2,300 mg per day.  Always check the nutrition information of foods labeled as "low-fat" or "nonfat". These foods may be higher in added sugar or refined carbs and should be avoided.  Talk to your dietitian to identify your daily goals for nutrients listed on the label. Shopping  Avoid buying canned, premade, or processed foods. These foods tend to be high in fat, sodium, and added sugar.  Shop around the outside edge of the grocery store.  This includes fresh fruits and vegetables, bulk grains, fresh meats, and fresh dairy. Cooking  Use low-heat cooking methods, such as baking, instead of high-heat cooking methods like deep frying.  Cook using healthy oils, such as olive, canola, or sunflower oil.  Avoid cooking with butter, cream, or high-fat meats. Meal planning  Eat meals and snacks regularly, preferably at the same times every day. Avoid going long periods of time without eating.  Eat foods high in fiber, such as fresh fruits, vegetables, beans, and whole grains. Talk to your dietitian about how many servings of carbs you can eat at each meal.  Eat 4-6 ounces (oz) of lean protein each day, such as lean meat, chicken, fish, eggs, or tofu. One oz of lean protein is equal to: ? 1 oz of meat, chicken, or fish. ? 1 egg. ?  cup of tofu.  Eat some foods each day that contain healthy fats, such as avocado, nuts, seeds, and fish. Lifestyle  Check your blood glucose regularly.  Exercise regularly as told by your health care provider. This may include: ? 150 minutes of moderate-intensity or vigorous-intensity exercise each week. This could be brisk walking, biking, or water aerobics. ? Stretching and doing strength exercises, such as yoga or weightlifting, at least 2 times a week.  Take medicines as told by your health care provider.  Do not use any products that contain nicotine or tobacco, such as cigarettes and e-cigarettes. If you need help quitting, ask your health care provider. Work with a Veterinary surgeon or diabetes educator to identify strategies to manage stress and any emotional and

## 2020-04-19 NOTE — Assessment & Plan Note (Signed)
Controlled, is seeing cardiology now- will defer medication and treatment plan to them. He was recently started on Entresto, reports doing well with this.

## 2020-04-19 NOTE — Progress Notes (Signed)
Subjective:  Patient ID: Stanley Levy, male    DOB: 1955-08-27  Age: 64 y.o. MRN: [id number]  CC:  Chief Complaint  Patient presents with   Diabetes    follow up      HPI  HPI   Stanley Levy is a pleasant 64 year old male patient of mine. He is here for follow up on his diabetes.  Blood sugars at home are running 150-180 per his reports.  Denies hypoglycemia.  Denies polydipsia and polyuria.  Last eye exam was June of 2021.  Last foot exam in office was July 2021.  Last A1c today in office 9.5% improved from 12.4%. Denies non-healing wounds or rashes. Denies signs of UTI or other infections. He reports he follows a low sugar diet and checks feet regularly without concerns. Reports staying hydrated by drinking water. Reports taking medications as directed.  He reports sleeping well. Denies changes in appetite. No trouble in chewing or swallowing. Denies changes in bladder or bowel habits. He denies memory changes. Denies recent falls- reports he is walking 4-5 miles daily. Denies Skin, hearing, or vision changes.   Denies declines the flu vaccine. Has not received the COVID vaccines either.   Denies chest pain, leg swelling, palpitations, headaches, dizziness or s&s of infection.  Today patient denies signs and symptoms of COVID 19 infection including fever, chills, cough, shortness of breath, and headache. Past Medical, Surgical, Social History, Allergies, and Medications have been Reviewed.   Past Medical History:  Diagnosis Date   Anxiety    Chronic combined systolic and diastolic CHF (congestive heart failure) (HCC)    a. EF 20% with global HK by echo in 08/2019. Also noted to have LV mural thrombus.    Diabetes mellitus, type II ([place])    Foot ulcer ([place])    a. normal ABIs in 2021.   History of noncompliance with medical treatment    Hyperlipidemia    Hypothyroidism    LV (left ventricular) mural thrombus    NICM (nonischemic cardiomyopathy) (HCC)     Nonproliferative diabetic retinopathy ([place]) 12/27/2019   Subacute confusional state 08/01/2019   Thyroid disease     Current Meds  Medication Sig   acetaminophen (TYLENOL) 325 MG tablet Take 2 tablets (650 mg total) by mouth every 4 (four) hours as needed for headache or mild pain.   apixaban (ELIQUIS) 5 MG TABS tablet Take 1 tablet (5 mg total) by mouth 2 (two) times daily.   blood glucose meter kit and supplies Dispense based on patient and insurance preference. Use up to four times daily as directed. (FOR ICD-10 E10.9, E11.9).   digoxin (LANOXIN) 0.125 MG tablet Take 1 tablet (0.125 mg total) by mouth daily.   escitalopram (LEXAPRO) 10 MG tablet Take 1 tablet (10 mg total) by mouth daily.   furosemide (LASIX) 40 MG tablet Take 1 tablet (40 mg total) by mouth 2 (two) times daily.   gabapentin (NEURONTIN) 100 MG capsule Take 1 capsule (100 mg total) by mouth 3 (three) times daily.   glucose blood (GLUCOSE METER TEST) test strip Use as instructed   insulin aspart protamine - aspart (NOVOLOG MIX 70/30 FLEXPEN) (70-30) 100 UNIT/ML FlexPen Inject 0.2 mLs (20 Units total) into the skin 2 (two) times daily with a meal.   Insulin Pen Needle 31G X 5 MM MISC 1 Units by Does not apply route 2 (two) times daily.   Lancets 30G MISC Check BG twice daily, once before breakfast and once  before supper.   levothyroxine (SYNTHROID) 50 MCG tablet Take 1 tablet (50 mcg total) by mouth daily before breakfast.   metFORMIN (GLUCOPHAGE XR) 500 MG 24 hr tablet Take 1 tablet (500 mg total) by mouth daily with breakfast.   metoprolol succinate (TOPROL-XL) 25 MG 24 hr tablet Take 1 tablet (25 mg total) by mouth daily.   OneTouch Delica Lancets 65K MISC Four times daily testing  dx e11.65   potassium chloride SA (KLOR-CON) 20 MEQ tablet Take 2 tablets (40 mEq total) by mouth daily.   rosuvastatin (CRESTOR) 5 MG tablet Take 1 tablet (5 mg total) by mouth every evening.   sacubitril-valsartan (ENTRESTO)  24-26 MG Take 1 tablet by mouth 2 (two) times daily.   spironolactone (ALDACTONE) 25 MG tablet Take 0.5 tablets (12.5 mg total) by mouth daily.   tamsulosin (FLOMAX) 0.4 MG CAPS capsule Take 1 capsule (0.4 mg total) by mouth daily after supper.   Vitamin D, Ergocalciferol, (DRISDOL) 1.25 MG (50000 UNIT) CAPS capsule Take 1 capsule (50,000 Units total) by mouth every 7 (seven) days.    ROS:  Review of Systems  Constitutional: Negative.   HENT: Negative.   Eyes: Negative.   Respiratory: Negative.   Cardiovascular: Negative.   Gastrointestinal: Negative.   Genitourinary: Negative.   Musculoskeletal: Negative.   Skin: Negative.   Neurological: Negative.   Endo/Heme/Allergies: Negative.   Psychiatric/Behavioral: Negative.      Objective:   Today's Vitals: BP 132/76    Pulse 86    Resp 16    Ht 5' 9"  (1.753 m)    Wt 192 lb (87.1 kg)    SpO2 99%    BMI 28.35 kg/m  Vitals with BMI 04/19/2020 03/13/2020 03/06/2020  Height 5' 9"  5' 9"  5' 9"   Weight 192 lbs 190 lbs 191 lbs 10 oz  BMI 28.34 35.46 56.81  Systolic 275 170 -  Diastolic 76 78 -  Pulse 86 88 -  Some encounter information is confidential and restricted. Go to Review Flowsheets activity to see all data.     Physical Exam Vitals and nursing note reviewed.  Constitutional:      Appearance: Normal appearance. He is well-developed and well-groomed. He is obese.  HENT:     Head: Normocephalic and atraumatic.     Right Ear: External ear normal.     Left Ear: External ear normal.     Mouth/Throat:     Comments: Mask in place  Eyes:     General:        Right eye: No discharge.        Left eye: No discharge.     Conjunctiva/sclera: Conjunctivae normal.  Cardiovascular:     Rate and Rhythm: Normal rate and regular rhythm.     Pulses: Normal pulses.     Heart sounds: Normal heart sounds.  Pulmonary:     Effort: Pulmonary effort is normal.     Breath sounds: Normal breath sounds.  Musculoskeletal:        General: Normal  range of motion.     Cervical back: Normal range of motion and neck supple.  Skin:    General: Skin is warm.  Neurological:     General: No focal deficit present.     Mental Status: He is alert and oriented to person, place, and time.  Psychiatric:        Attention and Perception: Attention normal.        Mood and Affect: Mood normal.  Speech: Speech normal.        Behavior: Behavior normal. Behavior is cooperative.        Thought Content: Thought content normal.        Cognition and Memory: Cognition normal.        Judgment: Judgment normal.      Assessment   1. Uncontrolled type 2 diabetes mellitus with hyperglycemia ([place])   2. Essential hypertension, benign   3. Overweight with body mass index (BMI) of 28 to 28.9 in adult     Tests ordered No orders of the defined types were placed in this encounter.    Plan: Please see assessment and plan per problem list above.   No orders of the defined types were placed in this encounter.   Patient to follow-up in Feb for CPE   Note: This dictation was prepared with Dragon dictation along with smaller phrase technology. Similar sounding words can be transcribed inadequately or may not be corrected upon review. Any transcriptional errors that result from this process are unintentional.      Perlie Mayo, NP

## 2020-04-19 NOTE — Assessment & Plan Note (Signed)
Much improved, but still not well controlled. A1c today was 9.5% down from 12.4% in July. He has follow up with Dr Fransico Him next month. I have encouraged and reviewed diabetic diet with him and provided written information as well. Continue all medications and keep follow up with Endo for on going management.

## 2020-04-19 NOTE — Assessment & Plan Note (Signed)
Stable encouraged to continue his walking daily and to focus on eat a healthy diet.    Wt Readings from Last 3 Encounters:  04/19/20 192 lb (87.1 kg)  03/13/20 190 lb (86.2 kg)  03/06/20 191 lb 9.6 oz (86.9 kg)

## 2020-05-03 ENCOUNTER — Encounter (HOSPITAL_COMMUNITY): Payer: Self-pay | Admitting: Psychiatry

## 2020-05-03 ENCOUNTER — Telehealth (INDEPENDENT_AMBULATORY_CARE_PROVIDER_SITE_OTHER): Payer: Medicare HMO | Admitting: Psychiatry

## 2020-05-03 ENCOUNTER — Other Ambulatory Visit: Payer: Self-pay

## 2020-05-03 DIAGNOSIS — F411 Generalized anxiety disorder: Secondary | ICD-10-CM | POA: Diagnosis not present

## 2020-05-03 MED ORDER — ESCITALOPRAM OXALATE 10 MG PO TABS: 10.0000 mg | ORAL_TABLET | Freq: Every day | ORAL | 2 refills | Status: DC

## 2020-05-03 NOTE — Progress Notes (Signed)
Virtual Visit via Telephone Note  I connected with Stanley Levy on 05/03/20 at  9:00 AM EDT by telephone and verified that I am speaking with the correct person using two identifiers.  Location: Patient: home Provider: office   I discussed the limitations, risks, security and privacy concerns of performing an evaluation and management service by telephone and the availability of in person appointments. I also discussed with the patient that there may be a patient responsible charge related to this service. The patient expressed understanding and agreed to proceed.     I discussed the assessment and treatment plan with the patient. The patient was provided an opportunity to ask questions and all were answered. The patient agreed with the plan and demonstrated an understanding of the instructions.   The patient was advised to call back or seek an in-person evaluation if the symptoms worsen or if the condition fails to improve as anticipated.  I provided 15 minutes of non-face-to-face time during this encounter.   [name], MD  [facility] MD/PA/NP OP Progress Note  05/03/2020 9:22 AM Stanley Levy  MRN:  [id number]  Chief Complaint:  Chief Complaint    Depression; Anxiety; Follow-up     HPI: This patient is a 64 year old married white male who lives with his wife in [place]. They have no children. 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 He began having recurrent panic attacks. His family doctor referred him to a psychiatrist in [place] who started him on imipramine and Xanax. After a while he no longer needed the Xanax but the imipramine has kept the panic attacks at [place]. His mood is good and he is very active at work and he also goes to the gym to lift weights. He takes very good care of his health. He only has elevated cholesterol and mild hypothyroidism for his  health problems..  The patient returns for follow-up after 3 months.  Last time we reviewed all the changes in his health problems.  He is dealing with diabetes with thrombus in his left ventricle which has resolved as well as hypothyroidism.  His TSH was still elevated and his A1c was 12.9.  His note from his nurse practitioner indicates that his A1c had come down but I do not see this in the record anywhere.  He needs to have more lab work done and he has not done this yet and I explained he needs to do this as soon as possible.  In terms of his mental status he states he is in a "great mood".  He is walking 6 to 7 miles a day and trying to be very active.  He is sleeping well.  Unfortunately he is still taking the imipramine after explained that cardiology did not want him to be on it and I have sent in Lexapro.  He states that now he understands this and he will go pick up the Lexapro today   Visit Diagnosis:    ICD-10-CM   1. Generalized anxiety disorder  F41.1     Past Psychiatric History: Long history of anxiety disorder  Past Medical History:  Past Medical History:  Diagnosis Date  . Anxiety   . Chronic combined systolic and diastolic CHF (congestive heart failure) (HCC)    a. EF 20% with global HK by echo in 08/2019. Also noted to have LV mural thrombus.   Marland Kitchen  Diabetes mellitus, type II ([place])   . Foot ulcer ([place])    a. normal ABIs in 2021.  Marland Kitchen History of noncompliance with medical treatment   . Hyperlipidemia   . Hypothyroidism   . LV (left ventricular) mural thrombus   . NICM (nonischemic cardiomyopathy) ([place])   . Nonproliferative diabetic retinopathy ([place]) 12/27/2019  . Subacute confusional state 08/01/2019  . Thyroid disease     Past Surgical History:  Procedure Laterality Date  . FOOT SURGERY    . RIGHT/LEFT HEART CATH AND CORONARY ANGIOGRAPHY N/A 09/20/2019   Procedure: RIGHT/LEFT HEART CATH AND CORONARY ANGIOGRAPHY;  Surgeon: Leonie Man, MD;  Location: [facility];  Service: Cardiovascular;  Laterality: N/A;    Family Psychiatric History: see below  Family History:  Family History  Problem Relation Age of Onset  . Heart attack Father        Near Age 31  . Anxiety disorder Neg Hx   . Dementia Neg Hx   . Alcohol abuse Neg Hx   . Drug abuse Neg Hx   . ADD / ADHD Neg Hx   . Bipolar disorder Neg Hx   . Depression Neg Hx   . OCD Neg Hx   . Paranoid behavior Neg Hx   . Physical abuse Neg Hx   . Schizophrenia Neg Hx   . Seizures Neg Hx   . Sexual abuse Neg Hx     Social History:  Social History   Socioeconomic History  . Marital status: Married    Spouse name: Jarquavious Fentress   . Number of children: Not on file  . Years of education: Not on file  . Highest education level: High school graduate  Occupational History  . Not on file  Tobacco Use  . Smoking status: Never Smoker  . Smokeless tobacco: Never Used  Vaping Use  . Vaping Use: Never used  Substance and Sexual Activity  . Alcohol use: No  . Drug use: No  . Sexual activity: Yes  Other Topics Concern  . Not on file  Social History Narrative   Recent left working at [employer] after getting sick in start of February      Lives with wife [name]- married 79 (he is her caregiver, she has had strokes)    No children      Beagle: Lorre Nick       Enjoys: weight lift, yard work, Oceanographer, read-       Diet: increasing veggies, was drinking chocolate milk, root beer, and sweet tea, fast food   Caffeine: sweet tea-3 cups of more daily    Water: 8 cups daily       Wears seat belt    Smoke detectors    Does not use phone while driving    Social Determinants of Health   Financial Resource Strain: Low Risk   . Difficulty of Paying Living Expenses: Not hard at all  Food Insecurity: No Food Insecurity  . Worried About Charity fundraiser in the Last Year: Never true  . Ran Out of Food in the Last Year: Never true  Transportation Needs: No Transportation Needs  . Lack of Transportation  (Medical): No  . Lack of Transportation (Non-Medical): No  Physical Activity: Inactive  . Days of Exercise per Week: 0 days  . Minutes of Exercise per Session: 0 min  Stress: No Stress Concern Present  . Feeling of Stress : Not at all  Social Connections: Moderately Integrated  .  Frequency of Communication with Friends and Family: Twice a week  . Frequency of Social Gatherings with Friends and Family: Twice a week  . Attends Religious Services: 1 to 4 times per year  . Active Member of Clubs or Organizations: No  . Attends Archivist Meetings: Never  . Marital Status: Married    Allergies: No Known Allergies  Metabolic Disorder Labs: Lab Results  Component Value Date   HGBA1C 12.9 (A) 01/12/2020   HGBA1C 12.9 01/12/2020   HGBA1C 12.9 (A) 01/12/2020   HGBA1C 12.9 (A) 01/12/2020   MPG NOT CALCULATED 08/01/2019   MPG 146 (H) 05/29/2011   No results found for: PROLACTIN Lab Results  Component Value Date   CHOL 168 08/11/2019   TRIG 185 (H) 08/11/2019   HDL 56 08/11/2019   CHOLHDL 3.0 08/11/2019   LDLCALC 84 08/11/2019   Lab Results  Component Value Date   TSH 11.88 (H) 01/12/2020   TSH 8.634 (H) 10/03/2019    Therapeutic Level Labs: No results found for: LITHIUM No results found for: VALPROATE No components found for:  CBMZ  Current Medications: Current Outpatient Medications  Medication Sig Dispense Refill  . acetaminophen (TYLENOL) 325 MG tablet Take 2 tablets (650 mg total) by mouth every 4 (four) hours as needed for headache or mild pain. 12 tablet 0  . apixaban (ELIQUIS) 5 MG TABS tablet Take 1 tablet (5 mg total) by mouth 2 (two) times daily. 60 tablet 3  . blood glucose meter kit and supplies Dispense based on patient and insurance preference. Use up to four times daily as directed. (FOR ICD-10 E10.9, E11.9). 1 each 0  . digoxin (LANOXIN) 0.125 MG tablet Take 1 tablet (0.125 mg total) by mouth daily. 30 tablet 3  . escitalopram (LEXAPRO) 10 MG  tablet Take 1 tablet (10 mg total) by mouth daily. 30 tablet 2  . furosemide (LASIX) 40 MG tablet Take 1 tablet (40 mg total) by mouth 2 (two) times daily. 180 tablet 3  . gabapentin (NEURONTIN) 100 MG capsule Take 1 capsule (100 mg total) by mouth 3 (three) times daily. 90 capsule 2  . glucose blood (GLUCOSE METER TEST) test strip Use as instructed 400 each 3  . insulin aspart protamine - aspart (NOVOLOG MIX 70/30 FLEXPEN) (70-30) 100 UNIT/ML FlexPen Inject 0.2 mLs (20 Units total) into the skin 2 (two) times daily with a meal. 5 pen 2  . Insulin Pen Needle 31G X 5 MM MISC 1 Units by Does not apply route 2 (two) times daily. 100 each 5  . Lancets 30G MISC Check BG twice daily, once before breakfast and once before supper. 50 each 11  . levothyroxine (SYNTHROID) 50 MCG tablet Take 1 tablet (50 mcg total) by mouth daily before breakfast. 90 tablet 1  . metFORMIN (GLUCOPHAGE XR) 500 MG 24 hr tablet Take 1 tablet (500 mg total) by mouth daily with breakfast. 30 tablet 3  . metoprolol succinate (TOPROL-XL) 25 MG 24 hr tablet Take 1 tablet (25 mg total) by mouth daily. 90 tablet 3  . OneTouch Delica Lancets 25O MISC Four times daily testing  dx e11.65 150 each 5  . potassium chloride SA (KLOR-CON) 20 MEQ tablet Take 2 tablets (40 mEq total) by mouth daily. 90 tablet 3  . rosuvastatin (CRESTOR) 5 MG tablet Take 1 tablet (5 mg total) by mouth every evening. 30 tablet 3  . sacubitril-valsartan (ENTRESTO) 24-26 MG Take 1 tablet by mouth 2 (two) times daily. 60 tablet 6  .  spironolactone (ALDACTONE) 25 MG tablet Take 0.5 tablets (12.5 mg total) by mouth daily. 15 tablet 3  . tamsulosin (FLOMAX) 0.4 MG CAPS capsule Take 1 capsule (0.4 mg total) by mouth daily after supper. 30 capsule 3  . Vitamin D, Ergocalciferol, (DRISDOL) 1.25 MG (50000 UNIT) CAPS capsule Take 1 capsule (50,000 Units total) by mouth every 7 (seven) days. 5 capsule 2   No current facility-administered medications for this visit.      Musculoskeletal: Strength & Muscle Tone: within normal limits Gait & Station: normal Patient leans: N/A  Psychiatric Specialty Exam: Review of Systems  All other systems reviewed and are negative.   There were no vitals taken for this visit.There is no height or weight on file to calculate BMI.  General Appearance: NA  Eye Contact:  NA  Speech:  Clear and Coherent  Volume:  Normal  Mood:  Euthymic  Affect:  NA  Thought Process:  Goal Directed  Orientation:  Full (Time, Place, and Person)  Thought Content: WDL   Suicidal Thoughts:  No  Homicidal Thoughts:  No  Memory:  Immediate;   Good Recent;   Good Remote;   NA  Judgement:  Poor  Insight:  Shallow  Psychomotor Activity:  Normal  Concentration:  Concentration: Good and Attention Span: Good  Recall:  Good  Fund of Knowledge: Good  Language: Good  Akathisia:  No  Handed:  Right  AIMS (if indicated): not done  Assets:  Communication Skills Desire for Improvement Resilience Social Support Talents/Skills  ADL's:  Intact  Cognition: WNL  Sleep:  Good   Screenings: Mini-Mental     Office Visit from 09/08/2019 in [facility]  Total Score (max 30 points ) 28    PHQ2-9     Office Visit from 04/19/2020 in [facility] Nutrition from 03/06/2020 in [facility] Video Visit from 01/12/2020 in [facility] Office Visit from 08/10/2019 in [facility]  PHQ-2 Total Score 0 0 0 0       Assessment and Plan: This patient is a 64 year old male with a history of anxiety.  He is still taking imipramine and I have explained that cardiologist did not want him to take it.  He will discontinue this now and start Lexapro 10 mg daily for depression/anxiety.  He will return to see me in 2 months or call sooner as needed   [name], MD 05/03/2020, 9:22 AM

## 2020-05-04 DIAGNOSIS — I428 Other cardiomyopathies: Secondary | ICD-10-CM | POA: Diagnosis not present

## 2020-05-04 DIAGNOSIS — Z79899 Other long term (current) drug therapy: Secondary | ICD-10-CM | POA: Diagnosis not present

## 2020-05-04 LAB — BASIC METABOLIC PANEL WITH GFR
BUN: 16 mg/dL (ref 7–25)
CO2: 29 mmol/L (ref 20–32)
Calcium: 9.7 mg/dL (ref 8.6–10.3)
Chloride: 100 mmol/L (ref 98–110)
Creat: 0.99 mg/dL (ref 0.70–1.25)
GFR, Est African American: 92 mL/min/{1.73_m2} (ref >=60)
GFR, Est Non African American: 79 mL/min/{1.73_m2} (ref >=60)
Glucose, Bld: 395 mg/dL — ABNORMAL HIGH (ref 65–99)
Potassium: 4.4 mmol/L (ref 3.5–5.3)
Sodium: 136 mmol/L (ref 135–146)

## 2020-05-04 LAB — MAGNESIUM: Magnesium: 1.9 mg/dL (ref 1.5–2.5)

## 2020-05-07 ENCOUNTER — Telehealth: Payer: Self-pay | Admitting: *Deleted

## 2020-05-07 NOTE — Telephone Encounter (Signed)
Lesle Chris, LPN  [id number]/[id number] 71:24 AM EST Back to Top    No answer.

## 2020-05-07 NOTE — Telephone Encounter (Signed)
-----   Message from Netta Neat., NP sent at 05/06/2020  6:25 PM EST ----- Chemistry looks good except for sugar level of 395. He needs to follow with his PCP to get better control of his diabetes. Please forward results to PCP. Thanks

## 2020-05-17 NOTE — Telephone Encounter (Signed)
Lesle Chris, LPN  07/61/5183 10:12 AM EST Back to Top    2nd attempt - wireless customer not available.

## 2020-05-22 ENCOUNTER — Encounter: Payer: Self-pay | Admitting: *Deleted

## 2020-05-22 NOTE — Telephone Encounter (Signed)
Lesle Chris, LPN  66/59/9357 4:48 PM EST Back to Top    No response to date. Will mail notification letter. Copy to pcp & Dr. Fransico Him.

## 2020-05-28 ENCOUNTER — Ambulatory Visit: Payer: PPO | Admitting: "Endocrinology

## 2020-05-28 ENCOUNTER — Ambulatory Visit: Payer: Medicare HMO | Admitting: Nutrition

## 2020-06-04 ENCOUNTER — Ambulatory Visit: Payer: Medicare HMO | Admitting: Nutrition

## 2020-07-03 ENCOUNTER — Telehealth (HOSPITAL_COMMUNITY): Payer: Medicare HMO | Admitting: Psychiatry

## 2020-07-17 ENCOUNTER — Telehealth (HOSPITAL_COMMUNITY): Payer: Self-pay | Admitting: Psychiatry

## 2020-07-17 NOTE — Telephone Encounter (Signed)
Called to schedule f/u appt, left message with wife to return call

## 2020-08-17 ENCOUNTER — Encounter: Payer: Medicare HMO | Admitting: Nurse Practitioner

## 2020-09-11 ENCOUNTER — Encounter: Payer: Self-pay | Admitting: Cardiology

## 2020-09-11 NOTE — Progress Notes (Signed)
Cardiology Office Note  Date: 09/12/2020   ID: Keland, Peyton February 20, 1956, MRN [id number]  PCP:  Perlie Mayo, NP  Cardiologist:  Rozann Lesches, MD Electrophysiologist:  None   Chief Complaint  Patient presents with   Cardiac follow-up    History of Present Illness: Stanley Levy is a 65 y.o. male former patient of Dr. Bronson Ing now presenting to establish follow-up with me.  I reviewed his records and updated the chart.  He was last seen in September 2021 by Mr. Leonides Sake NP.  He presents today stating that he feels well.  He walks 3 to 4 miles for exercise in the morning, also goes to the [facility] to do free weights.  He reports NYHA class I dyspnea, no exertional chest pain, no palpitations or syncope.  Last echocardiogram in September 2021 indicated LVEF approximately 25% with global hypokinesis, acoustic shadowing at the apex without definitive mural thrombus, low normal RV contraction, moderate left atrial enlargement, mild mitral regurgitation.  I reviewed his medications, he states that he has been compliant with therapy and is tolerating Entresto.  He has not had any interval lab work however.  Reports no spontaneous bleeding problems on Eliquis.  I personally reviewed his ECG today which shows sinus rhythm with nonspecific ST-T changes.  QRS duration 106 ms.   Past Medical History:  Diagnosis Date   Anxiety    Chronic combined systolic and diastolic CHF (congestive heart failure) (HCC)    a. EF 20% with global HK by echo in 08/2019. Also noted to have LV mural thrombus.    Diabetes mellitus, type II ([place])    Foot ulcer ([place])    a. normal ABIs in 2021.   History of noncompliance with medical treatment    Hyperlipidemia    Hypothyroidism    LV (left ventricular) mural thrombus    NICM (nonischemic cardiomyopathy) (HCC)    Nonproliferative diabetic retinopathy ([place])     Past Surgical History:  Procedure Laterality Date   FOOT SURGERY      RIGHT/LEFT HEART CATH AND CORONARY ANGIOGRAPHY N/A 09/20/2019   Procedure: RIGHT/LEFT HEART CATH AND CORONARY ANGIOGRAPHY;  Surgeon: Leonie Man, MD;  Location: [facility];  Service: Cardiovascular;  Laterality: N/A;    Current Outpatient Medications  Medication Sig Dispense Refill   acetaminophen (TYLENOL) 325 MG tablet Take 2 tablets (650 mg total) by mouth every 4 (four) hours as needed for headache or mild pain. 12 tablet 0   apixaban (ELIQUIS) 5 MG TABS tablet Take 1 tablet (5 mg total) by mouth 2 (two) times daily. 60 tablet 3   blood glucose meter kit and supplies Dispense based on patient and insurance preference. Use up to four times daily as directed. (FOR ICD-10 E10.9, E11.9). 1 each 0   digoxin (LANOXIN) 0.125 MG tablet Take 1 tablet (0.125 mg total) by mouth daily. 30 tablet 3   escitalopram (LEXAPRO) 10 MG tablet Take 1 tablet (10 mg total) by mouth daily. 30 tablet 2   furosemide (LASIX) 40 MG tablet Take 1 tablet (40 mg total) by mouth 2 (two) times daily. 180 tablet 3   glucose blood (GLUCOSE METER TEST) test strip Use as instructed 400 each 3   insulin aspart protamine - aspart (NOVOLOG MIX 70/30 FLEXPEN) (70-30) 100 UNIT/ML FlexPen Inject 0.2 mLs (20 Units total) into the skin 2 (two) times daily with a meal. 5 pen 2   Insulin Pen Needle 31G X 5 MM MISC 1  Units by Does not apply route 2 (two) times daily. 100 each 5   Lancets 30G MISC Check BG twice daily, once before breakfast and once before supper. 50 each 11   levothyroxine (SYNTHROID) 50 MCG tablet Take 1 tablet (50 mcg total) by mouth daily before breakfast. 90 tablet 1   metFORMIN (GLUCOPHAGE XR) 500 MG 24 hr tablet Take 1 tablet (500 mg total) by mouth daily with breakfast. 30 tablet 3   metoprolol succinate (TOPROL-XL) 25 MG 24 hr tablet Take 1 tablet (25 mg total) by mouth daily. 90 tablet 3   OneTouch Delica Lancets 10C MISC Four times daily testing  dx e11.65 150 each 5   potassium  chloride SA (KLOR-CON) 20 MEQ tablet Take 2 tablets (40 mEq total) by mouth daily. 90 tablet 3   rosuvastatin (CRESTOR) 5 MG tablet Take 1 tablet (5 mg total) by mouth every evening. 30 tablet 3   sacubitril-valsartan (ENTRESTO) 24-26 MG Take 1 tablet by mouth 2 (two) times daily. 60 tablet 6   spironolactone (ALDACTONE) 25 MG tablet Take 0.5 tablets (12.5 mg total) by mouth daily. 15 tablet 3   tamsulosin (FLOMAX) 0.4 MG CAPS capsule Take 1 capsule (0.4 mg total) by mouth daily after supper. 30 capsule 3   Vitamin D, Ergocalciferol, (DRISDOL) 1.25 MG (50000 UNIT) CAPS capsule Take 1 capsule (50,000 Units total) by mouth every 7 (seven) days. 5 capsule 2   gabapentin (NEURONTIN) 100 MG capsule Take 1 capsule (100 mg total) by mouth 3 (three) times daily. 90 capsule 2   No current facility-administered medications for this visit.   Allergies:  Patient has no known allergies.   ROS: No syncope.  Physical Exam: VS:  BP 110/78    Pulse 96    Ht $R'5\' 9"'qg$  (1.753 m)    Wt 195 lb (88.5 kg)    BMI 28.80 kg/m , BMI Body mass index is 28.8 kg/m.  Wt Readings from Last 3 Encounters:  09/12/20 195 lb (88.5 kg)  04/19/20 192 lb (87.1 kg)  03/13/20 190 lb (86.2 kg)    General: Patient appears comfortable at rest. HEENT: Conjunctiva and lids normal, wearing a mask. Neck: Supple, no elevated JVP or carotid bruits, no thyromegaly. Lungs: Clear to auscultation, nonlabored breathing at rest. Cardiac: Regular rate and rhythm, no S3, soft systolic murmur, no pericardial rub. Extremities: No pitting edema.  ECG:  An ECG dated 09/13/2019 was personally reviewed today and demonstrated:  Sinus rhythm with PVCs, low voltage, poor R wave progression.  Recent Labwork: 09/13/2019: B Natriuretic Peptide 1,082.0 01/12/2020: ALT 11; AST 11; Hemoglobin 13.9; Platelets 168; TSH 11.88 05/04/2020: BUN 16; Creat 0.99; Magnesium 1.9; Potassium 4.4; Sodium 136     Component Value Date/Time   CHOL 168 08/11/2019 0859    TRIG 185 (H) 08/11/2019 0859   HDL 56 08/11/2019 0859   CHOLHDL 3.0 08/11/2019 0859   LDLCALC 84 08/11/2019 0859    Other Studies Reviewed Today:  Cardiac catheterization 09/20/2019:  Hemodynamic findings consistent with moderate pulmonary hypertension.  LV end diastolic pressure is severely elevated.  Angiographically normal coronary arteries; large draping vessels. Left dominant system   SUMMARY  Nonischemic cardiomyopathy: Angiographically normal coronary arteries with Left dominant system  Severe combined systolic and diastolic cardiomyopathy: EF severely reduced by EF, PCWP of 30 with LVEDP of 36.  Echocardiogram 03/14/2020: 1. Left ventricular ejection fraction, by estimation, is approximately  25%. The left ventricle has severely decreased function. The left  ventricle demonstrates global hypokinesis.  Left ventricular diastolic  parameters are indeterminate. Acoustic  shadowing at apex without definitive mural thrombus - would suggest  limited Definity contrast study given prior history.  2. Right ventricular systolic function is low normal. The right  ventricular size is normal. Tricuspid regurgitation signal is inadequate  for assessing PA pressure.  3. Left atrial size was moderately dilated.  4. The mitral valve is grossly normal. Mild mitral valve regurgitation.  5. The aortic valve is tricuspid. Aortic valve regurgitation is trivial.  6. The inferior vena cava is normal in size with greater than 50%  respiratory variability, suggesting right atrial pressure of 3 mmHg.   Assessment and Plan:  1.  Nonischemic cardiomyopathy with LVEF approximately 25% and low normal RV contraction by echocardiogram in September 2021.  He is symptomatically stable at this time, reports NYHA class I dyspnea, no significant fluid overload.  Current regimen includes Lanoxin, Toprol-XL, Entresto, and Aldactone.  Plan to follow-up BMET and also a repeat echocardiogram for  reevaluation.  I did talk with him about the possibility of EP consultation to discuss prophylactic ICD placement.  2.  History of LV mural thrombus, continues on Eliquis.  Plan to obtain follow-up echocardiogram with Definity contrast for reassessment.  3.  History of poorly controlled type 2 diabetes mellitus.  Last hemoglobin A1c was 12.9%.  He has been on Glucophage XR and insulin per PCP.  Suspect compliance is an issue.  SGLT2i should also be considered.  Medication Adjustments/Labs and Tests Ordered: Current medicines are reviewed at length with the patient today.  Concerns regarding medicines are outlined above.   Tests Ordered: Orders Placed This Encounter  Procedures   Basic metabolic panel   CBC   EKG 12-Lead   ECHOCARDIOGRAM COMPLETE    Medication Changes: No orders of the defined types were placed in this encounter.   Disposition:  Follow up 3 months in the [place] office.  Signed, Satira Sark, MD, [facility] 09/12/2020 9:53 AM    [facility] [place], [place], [place] [address] Phone: [phone]; Fax: [phone]

## 2020-09-12 ENCOUNTER — Ambulatory Visit: Payer: Medicare HMO | Admitting: Cardiology

## 2020-09-12 ENCOUNTER — Encounter: Payer: Self-pay | Admitting: Cardiology

## 2020-09-12 VITALS — BP 110/78 | HR 96 | Ht 69.0 in | Wt 195.0 lb

## 2020-09-12 DIAGNOSIS — I513 Intracardiac thrombosis, not elsewhere classified: Secondary | ICD-10-CM

## 2020-09-12 DIAGNOSIS — I428 Other cardiomyopathies: Secondary | ICD-10-CM

## 2020-09-12 DIAGNOSIS — Z79899 Other long term (current) drug therapy: Secondary | ICD-10-CM

## 2020-09-12 DIAGNOSIS — E1165 Type 2 diabetes mellitus with hyperglycemia: Secondary | ICD-10-CM | POA: Diagnosis not present

## 2020-09-12 NOTE — Patient Instructions (Addendum)
Medication Instructions:   Your physician recommends that you continue on your current medications as directed. Please refer to the Current Medication list given to you today.  Labwork:  Your physician recommends that you return for lab work in: to check BMET & CBC. Please do this at [facility] on the same day as your echocardiogram.  Testing/Procedures: Your physician has requested that you have an echocardiogram. Echocardiography is a painless test that uses sound waves to create images of your heart. It provides your doctor with information about the size and shape of your heart and how well your hearts chambers and valves are working. This procedure takes approximately one hour. There are no restrictions for this procedure.  Follow-Up:  Your physician recommends that you schedule a follow-up appointment in: 3 months.  Any Other Special Instructions Will Be Listed Below (If Applicable).  If you need a refill on your cardiac medications before your next appointment, please call your pharmacy.

## 2020-09-28 ENCOUNTER — Ambulatory Visit (HOSPITAL_COMMUNITY): Admission: RE | Admit: 2020-09-28 | Payer: Medicare HMO | Source: Ambulatory Visit

## 2020-10-10 ENCOUNTER — Telehealth: Payer: Self-pay | Admitting: Family Medicine

## 2020-10-10 NOTE — Telephone Encounter (Signed)
Tried calling patient to  schedule Medicare Annual Wellness Visit (AWV) either virtually or in office.  No answer  Last AWV ;no information  please schedule at anytime with [facility]  health coach  This should be a 40 minute visit.

## 2020-11-03 ENCOUNTER — Emergency Department (HOSPITAL_COMMUNITY): Payer: Medicare HMO

## 2020-11-03 ENCOUNTER — Encounter (HOSPITAL_COMMUNITY): Payer: Self-pay | Admitting: Emergency Medicine

## 2020-11-03 ENCOUNTER — Inpatient Hospital Stay (HOSPITAL_COMMUNITY)
Admission: EM | Admit: 2020-11-03 | Discharge: 2020-11-06 | DRG: 071 | Disposition: A | Discharge status: Home Health | Payer: Medicare HMO | Attending: Internal Medicine | Admitting: Internal Medicine

## 2020-11-03 ENCOUNTER — Other Ambulatory Visit: Payer: Self-pay

## 2020-11-03 DIAGNOSIS — R4182 Altered mental status, unspecified: Secondary | ICD-10-CM | POA: Diagnosis not present

## 2020-11-03 DIAGNOSIS — Z79890 Hormone replacement therapy (postmenopausal): Secondary | ICD-10-CM

## 2020-11-03 DIAGNOSIS — I7389 Other specified peripheral vascular diseases: Secondary | ICD-10-CM

## 2020-11-03 DIAGNOSIS — E113299 Type 2 diabetes mellitus with mild nonproliferative diabetic retinopathy without macular edema, unspecified eye: Secondary | ICD-10-CM | POA: Diagnosis present

## 2020-11-03 DIAGNOSIS — R410 Disorientation, unspecified: Secondary | ICD-10-CM | POA: Diagnosis not present

## 2020-11-03 DIAGNOSIS — E785 Hyperlipidemia, unspecified: Secondary | ICD-10-CM | POA: Diagnosis present

## 2020-11-03 DIAGNOSIS — Z818 Family history of other mental and behavioral disorders: Secondary | ICD-10-CM

## 2020-11-03 DIAGNOSIS — N179 Acute kidney failure, unspecified: Secondary | ICD-10-CM | POA: Diagnosis present

## 2020-11-03 DIAGNOSIS — R0689 Other abnormalities of breathing: Secondary | ICD-10-CM | POA: Diagnosis not present

## 2020-11-03 DIAGNOSIS — I428 Other cardiomyopathies: Secondary | ICD-10-CM | POA: Diagnosis not present

## 2020-11-03 DIAGNOSIS — E782 Mixed hyperlipidemia: Secondary | ICD-10-CM | POA: Diagnosis present

## 2020-11-03 DIAGNOSIS — R000 Tachycardia, unspecified: Secondary | ICD-10-CM | POA: Diagnosis not present

## 2020-11-03 DIAGNOSIS — E870 Hyperosmolality and hypernatremia: Secondary | ICD-10-CM | POA: Diagnosis not present

## 2020-11-03 DIAGNOSIS — I5042 Chronic combined systolic (congestive) and diastolic (congestive) heart failure: Secondary | ICD-10-CM | POA: Diagnosis present

## 2020-11-03 DIAGNOSIS — E039 Hypothyroidism, unspecified: Secondary | ICD-10-CM | POA: Diagnosis present

## 2020-11-03 DIAGNOSIS — I513 Intracardiac thrombosis, not elsewhere classified: Secondary | ICD-10-CM | POA: Diagnosis present

## 2020-11-03 DIAGNOSIS — G9341 Metabolic encephalopathy: Secondary | ICD-10-CM | POA: Diagnosis not present

## 2020-11-03 DIAGNOSIS — F419 Anxiety disorder, unspecified: Secondary | ICD-10-CM | POA: Diagnosis present

## 2020-11-03 DIAGNOSIS — I110 Hypertensive heart disease with heart failure: Secondary | ICD-10-CM | POA: Diagnosis present

## 2020-11-03 DIAGNOSIS — E1165 Type 2 diabetes mellitus with hyperglycemia: Secondary | ICD-10-CM | POA: Diagnosis present

## 2020-11-03 DIAGNOSIS — Z79899 Other long term (current) drug therapy: Secondary | ICD-10-CM | POA: Diagnosis not present

## 2020-11-03 DIAGNOSIS — Z8249 Family history of ischemic heart disease and other diseases of the circulatory system: Secondary | ICD-10-CM

## 2020-11-03 DIAGNOSIS — Z9114 Patient's other noncompliance with medication regimen: Secondary | ICD-10-CM | POA: Diagnosis not present

## 2020-11-03 DIAGNOSIS — E872 Acidosis: Secondary | ICD-10-CM | POA: Diagnosis not present

## 2020-11-03 DIAGNOSIS — Z794 Long term (current) use of insulin: Secondary | ICD-10-CM

## 2020-11-03 DIAGNOSIS — Z781 Physical restraint status: Secondary | ICD-10-CM

## 2020-11-03 DIAGNOSIS — N400 Enlarged prostate without lower urinary tract symptoms: Secondary | ICD-10-CM | POA: Diagnosis present

## 2020-11-03 DIAGNOSIS — Z7901 Long term (current) use of anticoagulants: Secondary | ICD-10-CM | POA: Diagnosis not present

## 2020-11-03 DIAGNOSIS — R531 Weakness: Secondary | ICD-10-CM | POA: Diagnosis not present

## 2020-11-03 DIAGNOSIS — Z20822 Contact with and (suspected) exposure to covid-19: Secondary | ICD-10-CM | POA: Diagnosis not present

## 2020-11-03 DIAGNOSIS — E781 Pure hyperglyceridemia: Secondary | ICD-10-CM

## 2020-11-03 DIAGNOSIS — R739 Hyperglycemia, unspecified: Secondary | ICD-10-CM | POA: Diagnosis not present

## 2020-11-03 LAB — BLOOD GAS, VENOUS
Acid-base deficit: 0.5 mmol/L (ref 0.0–2.0)
Acid-base deficit: 1.4 mmol/L (ref 0.0–2.0)
Bicarbonate: 21.5 mmol/L (ref 20.0–28.0)
Bicarbonate: 21.4 mmol/L (ref 20.0–28.0)
FIO2: 21
FIO2: 21
O2 Saturation: 27.3 %
O2 Saturation: 33.9 %
Patient temperature: 37
Patient temperature: 37
pCO2, Ven: 48.1 mmHg (ref 44.0–60.0)
pCO2, Ven: 42.1 mmHg — ABNORMAL LOW (ref 44.0–60.0)
pH, Ven: 7.33 (ref 7.250–7.430)
pH, Ven: 7.36 (ref 7.250–7.430)
pO2, Ven: <31 mmHg — CL (ref 32.0–45.0)
pO2, Ven: <31 mmHg — CL (ref 32.0–45.0)

## 2020-11-03 LAB — URINALYSIS, ROUTINE W REFLEX MICROSCOPIC
Bacteria, UA: NONE SEEN
Bilirubin Urine: NEGATIVE
Glucose, UA: >=500 mg/dL — AB
Hgb urine dipstick: NEGATIVE
Ketones, ur: 20 mg/dL — AB
Leukocytes,Ua: NEGATIVE
Nitrite: NEGATIVE
Protein, ur: NEGATIVE mg/dL
Specific Gravity, Urine: 1.025 (ref 1.005–1.030)
pH: 6 (ref 5.0–8.0)

## 2020-11-03 LAB — CBC WITH DIFFERENTIAL/PLATELET
Abs Immature Granulocytes: 0.24 10*3/uL — ABNORMAL HIGH (ref 0.00–0.07)
Basophils Absolute: 0.1 10*3/uL (ref 0.0–0.1)
Basophils Relative: 0 %
Eosinophils Absolute: 0 10*3/uL (ref 0.0–0.5)
Eosinophils Relative: 0 %
HCT: 51.5 % (ref 39.0–52.0)
Hemoglobin: 16.3 g/dL (ref 13.0–17.0)
Immature Granulocytes: 2 %
Lymphocytes Relative: 16 %
Lymphs Abs: 2 10*3/uL (ref 0.7–4.0)
MCH: 31.5 pg (ref 26.0–34.0)
MCHC: 31.7 g/dL (ref 30.0–36.0)
MCV: 99.6 fL (ref 80.0–100.0)
Monocytes Absolute: 1 10*3/uL (ref 0.1–1.0)
Monocytes Relative: 8 %
Neutro Abs: 9.4 10*3/uL — ABNORMAL HIGH (ref 1.7–7.7)
Neutrophils Relative %: 74 %
Platelets: 245 10*3/uL (ref 150–400)
RBC: 5.17 MIL/uL (ref 4.22–5.81)
RDW: 13.2 % (ref 11.5–15.5)
WBC: 12.6 10*3/uL — ABNORMAL HIGH (ref 4.0–10.5)
nRBC: 0 % (ref 0.0–0.2)

## 2020-11-03 LAB — GLUCOSE, CAPILLARY
Glucose-Capillary: 336 mg/dL — ABNORMAL HIGH (ref 70–99)
Glucose-Capillary: 187 mg/dL — ABNORMAL HIGH (ref 70–99)
Glucose-Capillary: 215 mg/dL — ABNORMAL HIGH (ref 70–99)
Glucose-Capillary: 172 mg/dL — ABNORMAL HIGH (ref 70–99)
Glucose-Capillary: 180 mg/dL — ABNORMAL HIGH (ref 70–99)
Glucose-Capillary: 130 mg/dL — ABNORMAL HIGH (ref 70–99)
Glucose-Capillary: 265 mg/dL — ABNORMAL HIGH (ref 70–99)

## 2020-11-03 LAB — BASIC METABOLIC PANEL
Anion gap: 18 — ABNORMAL HIGH (ref 5–15)
Anion gap: 7 (ref 5–15)
Anion gap: 13 (ref 5–15)
BUN: 49 mg/dL — ABNORMAL HIGH (ref 8–23)
BUN: 39 mg/dL — ABNORMAL HIGH (ref 8–23)
BUN: 34 mg/dL — ABNORMAL HIGH (ref 8–23)
CO2: 23 mmol/L (ref 22–32)
CO2: 31 mmol/L (ref 22–32)
CO2: 26 mmol/L (ref 22–32)
Calcium: 9 mg/dL (ref 8.9–10.3)
Calcium: 9.2 mg/dL (ref 8.9–10.3)
Calcium: 9.1 mg/dL (ref 8.9–10.3)
Chloride: 105 mmol/L (ref 98–111)
Chloride: 116 mmol/L — ABNORMAL HIGH (ref 98–111)
Chloride: 117 mmol/L — ABNORMAL HIGH (ref 98–111)
Creatinine, Ser: 2.07 mg/dL — ABNORMAL HIGH (ref 0.61–1.24)
Creatinine, Ser: 1.5 mg/dL — ABNORMAL HIGH (ref 0.61–1.24)
Creatinine, Ser: 1.32 mg/dL — ABNORMAL HIGH (ref 0.61–1.24)
GFR, Estimated: 35 mL/min — ABNORMAL LOW (ref >60)
GFR, Estimated: 51 mL/min — ABNORMAL LOW (ref >60)
GFR, Estimated: 59 mL/min — ABNORMAL LOW (ref >60)
Glucose, Bld: 739 mg/dL (ref 70–99)
Glucose, Bld: 257 mg/dL — ABNORMAL HIGH (ref 70–99)
Glucose, Bld: 109 mg/dL — ABNORMAL HIGH (ref 70–99)
Potassium: 4 mmol/L (ref 3.5–5.1)
Potassium: 3.3 mmol/L — ABNORMAL LOW (ref 3.5–5.1)
Potassium: 3.3 mmol/L — ABNORMAL LOW (ref 3.5–5.1)
Sodium: 146 mmol/L — ABNORMAL HIGH (ref 135–145)
Sodium: 154 mmol/L — ABNORMAL HIGH (ref 135–145)
Sodium: 156 mmol/L — ABNORMAL HIGH (ref 135–145)

## 2020-11-03 LAB — RAPID URINE DRUG SCREEN, HOSP PERFORMED
Amphetamines: NOT DETECTED
Barbiturates: NOT DETECTED
Benzodiazepines: NOT DETECTED
Cocaine: NOT DETECTED
Opiates: NOT DETECTED
Tetrahydrocannabinol: NOT DETECTED

## 2020-11-03 LAB — CBG MONITORING, ED
Glucose-Capillary: >600 mg/dL (ref 70–99)
Glucose-Capillary: >600 mg/dL (ref 70–99)
Glucose-Capillary: 556 mg/dL (ref 70–99)
Glucose-Capillary: 467 mg/dL — ABNORMAL HIGH (ref 70–99)
Glucose-Capillary: 518 mg/dL (ref 70–99)

## 2020-11-03 LAB — PROCALCITONIN: Procalcitonin: <0.1 ng/mL

## 2020-11-03 LAB — ETHANOL: Alcohol, Ethyl (B): <10 mg/dL (ref <10)

## 2020-11-03 LAB — RESP PANEL BY RT-PCR (FLU A&B, COVID) ARPGX2
Influenza A by PCR: NEGATIVE
Influenza B by PCR: NEGATIVE
SARS Coronavirus 2 by RT PCR: NEGATIVE

## 2020-11-03 LAB — LACTIC ACID, PLASMA
Lactic Acid, Venous: 3 mmol/L (ref 0.5–1.9)
Lactic Acid, Venous: 3.9 mmol/L (ref 0.5–1.9)

## 2020-11-03 LAB — MAGNESIUM: Magnesium: 2.8 mg/dL — ABNORMAL HIGH (ref 1.7–2.4)

## 2020-11-03 LAB — TSH: TSH: 6.016 u[IU]/mL — ABNORMAL HIGH (ref 0.350–4.500)

## 2020-11-03 LAB — BETA-HYDROXYBUTYRIC ACID
Beta-Hydroxybutyric Acid: 4.93 mmol/L — ABNORMAL HIGH (ref 0.05–0.27)
Beta-Hydroxybutyric Acid: 0.41 mmol/L — ABNORMAL HIGH (ref 0.05–0.27)

## 2020-11-03 LAB — AMMONIA: Ammonia: <9 umol/L — ABNORMAL LOW (ref 9–35)

## 2020-11-03 LAB — MRSA PCR SCREENING: MRSA by PCR: NEGATIVE

## 2020-11-03 MED ORDER — ONDANSETRON HCL 4 MG/2ML IJ SOLN: 4.0000 mg | Freq: Four times a day (QID) | INTRAMUSCULAR | Status: DC | PRN

## 2020-11-03 MED ORDER — HEPARIN SODIUM (PORCINE) 5000 UNIT/ML IJ SOLN
5000.0000 [IU] | Freq: Three times a day (TID) | INTRAMUSCULAR | Status: DC
  Administered 2020-11-03 – 2020-11-04 (×3): 5000 [IU] via SUBCUTANEOUS
  Filled 2020-11-03 (×3): qty 1

## 2020-11-03 MED ORDER — METOPROLOL SUCCINATE ER 25 MG PO TB24
25.0000 mg | ORAL_TABLET | Freq: Every day | ORAL | Status: DC
  Administered 2020-11-03 – 2020-11-06 (×4): 25 mg via ORAL
  Filled 2020-11-03 (×4): qty 1

## 2020-11-03 MED ORDER — POTASSIUM CHLORIDE 10 MEQ/100ML IV SOLN
10.0000 meq | INTRAVENOUS | Status: AC
  Administered 2020-11-03 (×2): 10 meq via INTRAVENOUS
  Filled 2020-11-03 (×2): qty 100

## 2020-11-03 MED ORDER — DIGOXIN 125 MCG PO TABS
0.1250 mg | ORAL_TABLET | Freq: Every day | ORAL | Status: DC
  Administered 2020-11-03 – 2020-11-06 (×4): 0.125 mg via ORAL
  Filled 2020-11-03 (×4): qty 1

## 2020-11-03 MED ORDER — DEXTROSE 50 % IV SOLN: 0.0000 mL | INTRAVENOUS | Status: DC | PRN

## 2020-11-03 MED ORDER — LACTATED RINGERS IV BOLUS
20.0000 mL/kg | Freq: Once | INTRAVENOUS | Status: AC
  Administered 2020-11-03: 1770 mL via INTRAVENOUS

## 2020-11-03 MED ORDER — INSULIN REGULAR(HUMAN) IN NACL 100-0.9 UT/100ML-% IV SOLN
INTRAVENOUS | Status: DC
  Administered 2020-11-03: 13 [IU]/h via INTRAVENOUS
  Administered 2020-11-04: 12 [IU]/h via INTRAVENOUS
  Filled 2020-11-03 (×2): qty 100

## 2020-11-03 MED ORDER — ACETAMINOPHEN 325 MG PO TABS
650.0000 mg | ORAL_TABLET | Freq: Four times a day (QID) | ORAL | Status: DC | PRN
  Administered 2020-11-04 (×2): 650 mg via ORAL
  Filled 2020-11-03 (×2): qty 2

## 2020-11-03 MED ORDER — DEXTROSE IN LACTATED RINGERS 5 % IV SOLN: INTRAVENOUS | Status: DC

## 2020-11-03 MED ORDER — ACETAMINOPHEN 650 MG RE SUPP: 650.0000 mg | Freq: Four times a day (QID) | RECTAL | Status: DC | PRN

## 2020-11-03 MED ORDER — CHLORHEXIDINE GLUCONATE CLOTH 2 % EX PADS
6.0000 | MEDICATED_PAD | Freq: Every day | CUTANEOUS | Status: DC
  Administered 2020-11-04 – 2020-11-06 (×4): 6 via TOPICAL

## 2020-11-03 MED ORDER — ONDANSETRON HCL 4 MG PO TABS: 4.0000 mg | ORAL_TABLET | Freq: Four times a day (QID) | ORAL | Status: DC | PRN

## 2020-11-03 MED ORDER — LEVOTHYROXINE SODIUM 25 MCG PO TABS
50.0000 ug | ORAL_TABLET | Freq: Every day | ORAL | Status: DC
Start: 2020-11-04 — End: 2020-11-05
  Administered 2020-11-05: 50 ug via ORAL
  Filled 2020-11-03 (×2): qty 2

## 2020-11-03 MED ORDER — LACTATED RINGERS IV BOLUS
1000.0000 mL | Freq: Once | INTRAVENOUS | Status: AC
  Administered 2020-11-03: 1000 mL via INTRAVENOUS

## 2020-11-03 MED ORDER — LACTATED RINGERS IV SOLN: INTRAVENOUS | Status: DC

## 2020-11-03 NOTE — ED Notes (Signed)
No insulin pump present.

## 2020-11-03 NOTE — ED Notes (Signed)
Rep from [facility]) reports that facility is doing an update and will be after 2 pm before Rep can obtain readings.  Rep is to call facility once update is complete.

## 2020-11-03 NOTE — ED Notes (Signed)
Insulin rate unchanged.  Insulin running at 13 units/hr via pump.  Pt is having period of twitching since arrival to unit.

## 2020-11-03 NOTE — ED Provider Notes (Signed)
I provided a substantive portion of the care of this patient.  I personally performed the entirety of the history, exam and medical decision making for this encounter.  EKG Interpretation  Date/Time:  Saturday Nov 03 2020 10:05:57 EDT Ventricular Rate:  107 PR Interval:  167 QRS Duration: 109 QT Interval:  341 QTC Calculation: 455 R Axis:   39 Text Interpretation: Sinus tachycardia Probable left atrial enlargement Anterior infarct, old Borderline repolarization abnormality No significant change was found compared 08/24/19 Confirmed by Vanetta Mulders [id number]) on 11/03/2020 10:17:37 AM   Patient seen by me along with physician assistant.  Critical care time documented by physician assistant  Patient with a known history of type 2 diabetes.  Is been off any kind of diabetic medication for several weeks.  Arrived here with a glucose of 739.  No evidence of acidosis CO2 was 23.  BUN and creatinine markedly elevated at 49 and 2.07.  For GFR 35.  Patient's sodium elevated at 146.  Patient clinically extremely dehydrated.  Not hypotensive.  A little bit tachycardic.  Patient's beta hydroxybutyrate was 4.93.  His lactic acid was 3.  Blood gas pH was 7.33 technically not significantly acidotic.  Patient started on the hyperglycemic protocol.  Felt to be HHS.  Patient's mental status here he will awake he will follow commands.   Hospitalist will admit.   Vanetta Mulders, MD 11/03/20 1112

## 2020-11-03 NOTE — H&P (Signed)
History and Physical    DURANTE VIOLETT IHW:[id number] DOB: October 14, 1955 DOA: 11/03/2020  PCP: Perlie Mayo, NP   Patient coming from: Home  Chief Complaint: AMS  HPI: Stanley Levy is a 65 y.o. male with medical history significant for chronic combined systolic and diastolic CHF with nonischemic cardiomyopathy with LVEF 25%, LV mural thrombus, dyslipidemia, hypothyroidism, type 2 diabetes, and overall medication noncompliance who presented to the ED today with altered mentation after having stopped all of his medications including his insulin 3 months prior. His wife noted that he was more confused than usual and had stool in his pants. His wife is having a hard time caring for him at home due to her prior CVAs.  EMS arrived and noted that his CBG was high.  He apparently thought that he was overall doing well and did not need to take his medications according to his wife.  No other acute symptoms were reported leading up to the event.  Patient currently unable to give any further information given his condition.   ED Course: Vital signs are stable patient is afebrile.  He is noted to have leukocytosis of 12,600 and sodium of 146.  Glucose is 739 and lactic acid is 3.  BUN is 49 creatinine is 2.7.  Beta hydroxybutyrate is elevated at 4.93.  CT head with no acute findings aside from a new lesion to the left basal ganglia likely related to his HHS.  Chest x-ray with no acute findings.  Urinalysis pending.  He has been started on IV fluids as well as IV insulin.  Review of Systems: Reviewed as noted above, otherwise negative.  Past Medical History:  Diagnosis Date  . Anxiety   . Chronic combined systolic and diastolic CHF (congestive heart failure) (HCC)    a. EF 20% with global HK by echo in 08/2019. Also noted to have LV mural thrombus.   . Diabetes mellitus, type II ([place])   . Foot ulcer ([place])    a. normal ABIs in 2021.  Marland Kitchen History of noncompliance with medical treatment   . Hyperlipidemia    . Hypothyroidism   . LV (left ventricular) mural thrombus   . NICM (nonischemic cardiomyopathy) ([place])   . Nonproliferative diabetic retinopathy ([place])     Past Surgical History:  Procedure Laterality Date  . FOOT SURGERY    . RIGHT/LEFT HEART CATH AND CORONARY ANGIOGRAPHY N/A 09/20/2019   Procedure: RIGHT/LEFT HEART CATH AND CORONARY ANGIOGRAPHY;  Surgeon: Leonie Man, MD;  Location: [facility];  Service: Cardiovascular;  Laterality: N/A;     reports that he has never smoked. He has never used smokeless tobacco. He reports that he does not drink alcohol and does not use drugs.  No Known Allergies  Family History  Problem Relation Age of Onset  . Heart attack Father        Near Age 17  . Anxiety disorder Neg Hx   . Dementia Neg Hx   . Alcohol abuse Neg Hx   . Drug abuse Neg Hx   . ADD / ADHD Neg Hx   . Bipolar disorder Neg Hx   . Depression Neg Hx   . OCD Neg Hx   . Paranoid behavior Neg Hx   . Physical abuse Neg Hx   . Schizophrenia Neg Hx   . Seizures Neg Hx   . Sexual abuse Neg Hx     Prior to Admission medications   Medication Sig Start Date End Date Taking?  Authorizing Provider  acetaminophen (TYLENOL) 325 MG tablet Take 2 tablets (650 mg total) by mouth every 4 (four) hours as needed for headache or mild pain. 08/23/19   Roxan Hockey, MD  apixaban (ELIQUIS) 5 MG TABS tablet Take 1 tablet (5 mg total) by mouth 2 (two) times daily. 08/23/19   Roxan Hockey, MD  blood glucose meter kit and supplies Dispense based on patient and insurance preference. Use up to four times daily as directed. (FOR ICD-10 E10.9, E11.9). 08/02/19   Manuella Ghazi, Kamerin Axford D, DO  digoxin (LANOXIN) 0.125 MG tablet Take 1 tablet (0.125 mg total) by mouth daily. 08/24/19   Roxan Hockey, MD  escitalopram (LEXAPRO) 10 MG tablet Take 1 tablet (10 mg total) by mouth daily. 05/03/20 05/03/21  Cloria Spring, MD  furosemide (LASIX) 40 MG tablet Take 1 tablet (40 mg total) by mouth 2 (two) times  daily. 09/22/19   Duke, Tami Lin, PA  gabapentin (NEURONTIN) 100 MG capsule Take 1 capsule (100 mg total) by mouth 3 (three) times daily. 08/02/19 08/01/20  Heath Lark D, DO  glucose blood (GLUCOSE METER TEST) test strip Use as instructed 08/12/19   Perlie Mayo, NP  insulin aspart protamine - aspart (NOVOLOG MIX 70/30 FLEXPEN) (70-30) 100 UNIT/ML FlexPen Inject 0.2 mLs (20 Units total) into the skin 2 (two) times daily with a meal. 01/12/20   Nida, Marella Chimes, MD  Insulin Pen Needle 31G X 5 MM MISC 1 Units by Does not apply route 2 (two) times daily. 08/02/19   Manuella Ghazi, Deckard Stuber D, DO  Lancets 30G MISC Check BG twice daily, once before breakfast and once before supper. 09/22/19   Duke, Tami Lin, PA  levothyroxine (SYNTHROID) 50 MCG tablet Take 1 tablet (50 mcg total) by mouth daily before breakfast. 01/23/20   Cassandria Anger, MD  metFORMIN (GLUCOPHAGE XR) 500 MG 24 hr tablet Take 1 tablet (500 mg total) by mouth daily with breakfast. 01/23/20   Nida, Marella Chimes, MD  metoprolol succinate (TOPROL-XL) 25 MG 24 hr tablet Take 1 tablet (25 mg total) by mouth daily. 09/22/19   Duke, Tami Lin, PA  OneTouch Delica Lancets 96G MISC Four times daily testing  dx e11.65 09/08/19   Fayrene Helper, MD  potassium chloride SA (KLOR-CON) 20 MEQ tablet Take 2 tablets (40 mEq total) by mouth daily. 09/22/19   Duke, Tami Lin, PA  rosuvastatin (CRESTOR) 5 MG tablet Take 1 tablet (5 mg total) by mouth every evening. 08/23/19   Emokpae, Courage, MD  sacubitril-valsartan (ENTRESTO) 24-26 MG Take 1 tablet by mouth 2 (two) times daily. 04/04/20   Verta Ellen., NP  spironolactone (ALDACTONE) 25 MG tablet Take 0.5 tablets (12.5 mg total) by mouth daily. 08/24/19   Roxan Hockey, MD  tamsulosin (FLOMAX) 0.4 MG CAPS capsule Take 1 capsule (0.4 mg total) by mouth daily after supper. 08/23/19   Roxan Hockey, MD  Vitamin D, Ergocalciferol, (DRISDOL) 1.25 MG (50000 UNIT) CAPS capsule Take 1  capsule (50,000 Units total) by mouth every 7 (seven) days. 08/12/19   Perlie Mayo, NP    Physical Exam: Vitals:   11/03/20 0917 11/03/20 0925 11/03/20 1000  BP:  (!) 108/92 137/80  Pulse:  (!) 109 (!) 108  Resp:  (!) 23 20  Temp:  98 F (36.7 C)   SpO2:  99% 100%  Weight: 88.5 kg    Height: 5' 9"  (1.753 m)      Constitutional: NAD, confused, poor dentition Vitals:  11/03/20 0917 11/03/20 0925 11/03/20 1000  BP:  (!) 108/92 137/80  Pulse:  (!) 109 (!) 108  Resp:  (!) 23 20  Temp:  98 F (36.7 C)   SpO2:  99% 100%  Weight: 88.5 kg    Height: 5' 9"  (1.753 m)     Eyes: lids and conjunctivae normal Neck: normal, supple Respiratory: clear to auscultation bilaterally. Normal respiratory effort. No accessory muscle use.  Cardiovascular: Regular rate and rhythm, no murmurs. Abdomen: no tenderness, no distention. Bowel sounds positive.  Musculoskeletal:  No edema. Skin: no rashes, lesions, ulcers.  Psychiatric: Flat affect  Labs on Admission: I have personally reviewed following labs and imaging studies  CBC: Recent Labs  Lab 11/03/20 0937  WBC 12.6*  NEUTROABS 9.4*  HGB 16.3  HCT 51.5  MCV 99.6  PLT 782   Basic Metabolic Panel: Recent Labs  Lab 11/03/20 0937 11/03/20 0941  NA 146*  --   K 4.0  --   CL 105  --   CO2 23  --   GLUCOSE 739*  --   BUN 49*  --   CREATININE 2.07*  --   CALCIUM 9.0  --   MG  --  2.8*   GFR: Estimated Creatinine Clearance: 38.6 mL/min (A) (by C-G formula based on SCr of 2.07 mg/dL (H)). Liver Function Tests: No results for input(s): AST, ALT, ALKPHOS, BILITOT, PROT, ALBUMIN in the last 168 hours. No results for input(s): LIPASE, AMYLASE in the last 168 hours. No results for input(s): AMMONIA in the last 168 hours. Coagulation Profile: No results for input(s): INR, PROTIME in the last 168 hours. Cardiac Enzymes: No results for input(s): CKTOTAL, CKMB, CKMBINDEX, TROPONINI in the last 168 hours. BNP (last 3 results) No  results for input(s): PROBNP in the last 8760 hours. HbA1C: No results for input(s): HGBA1C in the last 72 hours. CBG: Recent Labs  Lab 11/03/20 0916 11/03/20 0948  GLUCAP >600* >600*   Lipid Profile: No results for input(s): CHOL, HDL, LDLCALC, TRIG, CHOLHDL, LDLDIRECT in the last 72 hours. Thyroid Function Tests: No results for input(s): TSH, T4TOTAL, FREET4, T3FREE, THYROIDAB in the last 72 hours. Anemia Panel: No results for input(s): VITAMINB12, FOLATE, FERRITIN, TIBC, IRON, RETICCTPCT in the last 72 hours. Urine analysis:    Component Value Date/Time   COLORURINE YELLOW 08/11/2019 [place] 08/11/2019 1840   LABSPEC 1.024 08/11/2019 1840   PHURINE 5.0 08/11/2019 1840   GLUCOSEU >=500 (A) 08/11/2019 1840   HGBUR NEGATIVE 08/11/2019 1840   BILIRUBINUR NEGATIVE 08/11/2019 1840   KETONESUR NEGATIVE 08/11/2019 1840   PROTEINUR NEGATIVE 08/11/2019 1840   NITRITE NEGATIVE 08/11/2019 1840   LEUKOCYTESUR NEGATIVE 08/11/2019 1840    Radiological Exams on Admission: CT Head Wo Contrast  Result Date: 11/03/2020 CLINICAL DATA:  64 year old male with delirium. Hyperglycemia, reportedly the patient decided to stop taking insulin. Altered mental status. EXAM: CT HEAD WITHOUT CONTRAST TECHNIQUE: Contiguous axial images were obtained from the base of the skull through the vertex without intravenous contrast. COMPARISON:  Brain MRI 08/02/2019.  Head CT 09/08/2019. FINDINGS: Brain: Cerebral volume appears stable since last year, with mild ex vacuo enlargement of the left lateral ventricle related to some periventricular white matter encephalomalacia, including at the anterior external capsule. Superimposed conspicuous and fairly circumscribed new hyperdensity limited to the left basal ganglia on series 6, image 11. No regional edema or mass effect. Stable gray-white matter differentiation elsewhere. Patchy right hemisphere white matter hypodensity. Chronic small dystrophic  calcification in the left parietal lobe. No convincing acute intracranial hemorrhage. No cortically based acute infarct identified. No midline shift, mass effect, or evidence of intracranial mass lesion. Normal basilar cisterns. Vascular: No suspicious intracranial vascular hyperdensity. Skull: Stable, negative. Sinuses/Orbits: Visualized paranasal sinuses and mastoids are stable and well aerated. Other: Visualized orbits and scalp soft tissues are within normal limits. IMPRESSION: 1. New circumscribed hyperdensity at the left basal ganglia since last year, with no regional edema or mass effect. 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 2. Otherwise stable CT appearance of chronic small vessel disease since last year. Electronically Signed   By: Genevie Ann M.D.   On: 11/03/2020 10:50   DG Chest Portable 1 View  Result Date: 11/03/2020 CLINICAL DATA:  Confusion. EXAM: PORTABLE CHEST 1 VIEW COMPARISON:  09/13/2019 and older exams. FINDINGS: Cardiac silhouette is normal in size. No mediastinal or hilar masses. Clear lungs. No pleural effusion or pneumothorax. Skeletal structures are grossly intact. IMPRESSION: No active disease. Electronically Signed   By: Lajean Manes M.D.   On: 11/03/2020 10:21    EKG: Independently reviewed. ST 107bpm with LAE.  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 -Continue on IVF and Insulin drip as ordered -Transition with diet once mentation improves -Non-compliance with home meds -Obtain HgA1c and DM coord  recommendations  AKI-likely prerenal -Baseline Cr last noted to be 0.9-1 -Continue IVF and monitor  -Related to above  Leukocytosis -Likely reactive -UA pending -CXR negative -Check procal  Lactic acidosis -Likely related to above -Continue IV fluid and trend  History of chronic combined systolic and diastolic CHF with with LV mural thrombus -LVEF 25% and global hypokinesis noted on 9/21 -Pt has been off anticoagulation, Entresto, digoxin, and Lasix -No acute findings currently  Dyslipidemia/NICM -Not taking statin  Hypothyroidism -Check TSH  BPH -Off Flomax  Medication non-compliance -Off meds over the last 3 months   DVT prophylaxis: Heparin Code Status: Full Family Communication: Wife at bedside 5/7 Disposition Plan:Admit for treatment of HHS Consults called:None Admission status: Inpatient, SDU  Juri Dinning D Oluwatomisin Hustead DO [facility]  If 7PM-7AM, please contact night-coverage [email or web address]  11/03/2020, 11:13 AM

## 2020-11-03 NOTE — ED Triage Notes (Signed)
Pt from home with wife. Wife states husband decided to stop taking his insulin. CBG high.

## 2020-11-03 NOTE — ED Provider Notes (Signed)
[facility] EMERGENCY DEPARTMENT Provider Note   CSN: [id number] Arrival date & time: 11/03/20  8144     History Chief Complaint  Patient presents with  . Hyperglycemia    Stanley Levy is a 65 y.o. male who presents via EMS for altered mental status.  CBG with EMS read HIGH, only information provided by the wife is that the patient stopped taking his insulin 3 months ago.  Patient unable to answer majority of questions in the room, frequently responds with his wife's name in response to any question asked.   LEVEL 5 CAVEAT due to patient's altered mental status.  I personally reviewed this patient's medical records.  He has history of type 2 diabetes, nonischemic cardiomyopathy, combined systolic and diastolic congestive heart failure with LVEF 25% on echocardiogram in 02/2020.  Heart catheterization in 08/2019 revealed angiographically normal coronary arteries.  Patient no showed for his echocardiogram in April of this year.  Patient also history of left ventricular mural thrombus, supposed be on Eliquis but has not taken for the last 3 months. Also has hx of HTN, hyperlipidemia.    HPI     Past Medical History:  Diagnosis Date  . Anxiety   . Chronic combined systolic and diastolic CHF (congestive heart failure) (HCC)    a. EF 20% with global HK by echo in 08/2019. Also noted to have LV mural thrombus.   . Diabetes mellitus, type II ([place])   . Foot ulcer ([place])    a. normal ABIs in 2021.  Marland Kitchen History of noncompliance with medical treatment   . Hyperlipidemia   . Hypothyroidism   . LV (left ventricular) mural thrombus   . NICM (nonischemic cardiomyopathy) ([place])   . Nonproliferative diabetic retinopathy [facility])     Patient Active Problem List   Diagnosis Date Noted  . Acute metabolic encephalopathy 81/85/6314  . Nonproliferative diabetic retinopathy ([place]) 12/27/2019  . Pressure injury of skin 09/21/2019  . At high risk for injury related to fall 09/08/2019  . Overweight with  body mass index (BMI) of 28 to 28.9 in adult 09/08/2019  . Goals of care, counseling/discussion   . Advanced care planning/counseling discussion   . Palliative care by specialist   . Hypothyroidism 08/18/2019  . Cognitive changes/memory Challenges 08/18/2019  . LV (left ventricular) mural thrombus   . Severe left ventricular systolic dysfunction   . Mixed hyperlipidemia   . Memory loss 08/16/2019  . Falls frequently 08/16/2019  . Fatigue 08/16/2019  . Essential hypertension, benign 08/16/2019  . Elevated TSH 08/16/2019  . Uncontrolled type 2 diabetes mellitus with hyperglycemia ([place]) 08/16/2019  . 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 % 97/07/6376  . CHF (congestive heart failure) ([place]) 08/15/2019  . Encounter for medication management 08/12/2019  . Hyperglycemia due to diabetes mellitus ([place]) 08/01/2019    Past Surgical History:  Procedure Laterality Date  . FOOT SURGERY    . RIGHT/LEFT HEART CATH AND CORONARY ANGIOGRAPHY N/A 09/20/2019   Procedure: RIGHT/LEFT HEART CATH AND CORONARY ANGIOGRAPHY;  Surgeon: Leonie Man, MD;  Location: [facility];  Service: Cardiovascular;  Laterality: N/A;       Family History  Problem Relation Age of Onset  . Heart attack Father        Near Age 85  . Anxiety disorder Neg Hx   . Dementia Neg Hx   . Alcohol abuse Neg Hx   . Drug  abuse Neg Hx   . ADD / ADHD Neg Hx   . Bipolar disorder Neg Hx   . Depression Neg Hx   . OCD Neg Hx   . Paranoid behavior Neg Hx   . Physical abuse Neg Hx   . Schizophrenia Neg Hx   . Seizures Neg Hx   . Sexual abuse Neg Hx     Social History   Tobacco Use  . Smoking status: Never Smoker  . Smokeless tobacco: Never Used  Vaping Use  . Vaping Use: Never used  Substance Use Topics  . Alcohol use: No  . Drug use: No    Home Medications Prior to Admission medications    Medication Sig Start Date End Date Taking? Authorizing Provider  acetaminophen (TYLENOL) 325 MG tablet Take 2 tablets (650 mg total) by mouth every 4 (four) hours as needed for headache or mild pain. 08/23/19   Roxan Hockey, MD  apixaban (ELIQUIS) 5 MG TABS tablet Take 1 tablet (5 mg total) by mouth 2 (two) times daily. 08/23/19   Roxan Hockey, MD  blood glucose meter kit and supplies Dispense based on patient and insurance preference. Use up to four times daily as directed. (FOR ICD-10 E10.9, E11.9). 08/02/19   Manuella Ghazi, Pratik D, DO  digoxin (LANOXIN) 0.125 MG tablet Take 1 tablet (0.125 mg total) by mouth daily. 08/24/19   Roxan Hockey, MD  escitalopram (LEXAPRO) 10 MG tablet Take 1 tablet (10 mg total) by mouth daily. 05/03/20 05/03/21  Cloria Spring, MD  furosemide (LASIX) 40 MG tablet Take 1 tablet (40 mg total) by mouth 2 (two) times daily. 09/22/19   Duke, Tami Lin, PA  gabapentin (NEURONTIN) 100 MG capsule Take 1 capsule (100 mg total) by mouth 3 (three) times daily. 08/02/19 08/01/20  Heath Lark D, DO  glucose blood (GLUCOSE METER TEST) test strip Use as instructed 08/12/19   Perlie Mayo, NP  insulin aspart protamine - aspart (NOVOLOG MIX 70/30 FLEXPEN) (70-30) 100 UNIT/ML FlexPen Inject 0.2 mLs (20 Units total) into the skin 2 (two) times daily with a meal. 01/12/20   Nida, Marella Chimes, MD  Insulin Pen Needle 31G X 5 MM MISC 1 Units by Does not apply route 2 (two) times daily. 08/02/19   Manuella Ghazi, Pratik D, DO  Lancets 30G MISC Check BG twice daily, once before breakfast and once before supper. 09/22/19   Duke, Tami Lin, PA  levothyroxine (SYNTHROID) 50 MCG tablet Take 1 tablet (50 mcg total) by mouth daily before breakfast. 01/23/20   Cassandria Anger, MD  metFORMIN (GLUCOPHAGE XR) 500 MG 24 hr tablet Take 1 tablet (500 mg total) by mouth daily with breakfast. 01/23/20   Nida, Marella Chimes, MD  metoprolol succinate (TOPROL-XL) 25 MG 24 hr tablet Take 1 tablet (25 mg total)  by mouth daily. 09/22/19   Duke, Tami Lin, PA  OneTouch Delica Lancets 77O MISC Four times daily testing  dx e11.65 09/08/19   Fayrene Helper, MD  potassium chloride SA (KLOR-CON) 20 MEQ tablet Take 2 tablets (40 mEq total) by mouth daily. 09/22/19   Duke, Tami Lin, PA  rosuvastatin (CRESTOR) 5 MG tablet Take 1 tablet (5 mg total) by mouth every evening. 08/23/19   Emokpae, Courage, MD  sacubitril-valsartan (ENTRESTO) 24-26 MG Take 1 tablet by mouth 2 (two) times daily. 04/04/20   Verta Ellen., NP  spironolactone (ALDACTONE) 25 MG tablet Take 0.5 tablets (12.5 mg total) by mouth daily. 08/24/19   Emokpae, Courage,  MD  tamsulosin (FLOMAX) 0.4 MG CAPS capsule Take 1 capsule (0.4 mg total) by mouth daily after supper. 08/23/19   Roxan Hockey, MD  Vitamin D, Ergocalciferol, (DRISDOL) 1.25 MG (50000 UNIT) CAPS capsule Take 1 capsule (50,000 Units total) by mouth every 7 (seven) days. 08/12/19   Perlie Mayo, NP    Allergies    Patient has no known allergies.  Review of Systems   Review of Systems  Unable to perform ROS: Mental status change    Physical Exam Updated Vital Signs BP 137/80   Pulse (!) 108   Temp 98 F (36.7 C)   Resp 20   Ht _0  (1.753 m)   Wt 88.5 kg   SpO2 100%   BMI 28.80 kg/m   Physical Exam Vitals and nursing note reviewed.  Constitutional:      Appearance: He is toxic-appearing.  HENT:     Head: Normocephalic and atraumatic.     Nose: Nose normal.     Mouth/Throat:     Mouth: Mucous membranes are dry.     Dentition: Abnormal dentition. Gingival swelling and dental caries present.     Pharynx: Oropharynx is clear. Uvula midline. No oropharyngeal exudate, posterior oropharyngeal erythema or uvula swelling.  Eyes:     General: Lids are normal. Vision grossly intact.        Right eye: No discharge.        Left eye: No discharge.     Extraocular Movements: Extraocular movements intact.     Conjunctiva/sclera: Conjunctivae normal.      Pupils: Pupils are equal, round, and reactive to light.  Neck:     Trachea: Trachea and phonation normal.  Cardiovascular:     Rate and Rhythm: Regular rhythm. Tachycardia present.     Pulses: Normal pulses.     Heart sounds: Normal heart sounds. No murmur heard.   Pulmonary:     Effort: Pulmonary effort is normal. Tachypnea present. No bradypnea, accessory muscle usage or respiratory distress.     Breath sounds: Normal breath sounds. No wheezing or rales.  Chest:     Chest wall: No mass, lacerations, deformity, swelling, tenderness, crepitus or edema.  Abdominal:     General: Bowel sounds are normal. There is no distension.     Palpations: Abdomen is soft.     Tenderness: There is no abdominal tenderness. There is no right CVA tenderness, left CVA tenderness, guarding or rebound.  Musculoskeletal:        General: No deformity.     Cervical back: Normal range of motion and neck supple. No rigidity or crepitus. No pain with movement, spinous process tenderness or muscular tenderness.     Right lower leg: No edema.     Left lower leg: No edema.  Lymphadenopathy:     Cervical: No cervical adenopathy.  Skin:    General: Skin is warm and dry.  Neurological:     Mental Status: He is alert. He is disoriented and confused.     Cranial Nerves: Cranial nerves are intact.     Motor: Motor function is intact.  Psychiatric:        Mood and Affect: Mood normal.     ED Results / Procedures / Treatments   Labs (all labs ordered are listed, but only abnormal results are displayed) Labs Reviewed  BASIC METABOLIC PANEL - Abnormal; Notable for the following components:      Result Value   Sodium 146 (*)    Glucose, Bld  739 (*)    BUN 49 (*)    Creatinine, Ser 2.07 (*)    GFR, Estimated 35 (*)    Anion gap 18 (*)    All other components within normal limits  CBC WITH DIFFERENTIAL/PLATELET - Abnormal; Notable for the following components:   WBC 12.6 (*)    Neutro Abs 9.4 (*)    Abs  Immature Granulocytes 0.24 (*)    All other components within normal limits  BETA-HYDROXYBUTYRIC ACID - Abnormal; Notable for the following components:   Beta-Hydroxybutyric Acid 4.93 (*)    All other components within normal limits  LACTIC ACID, PLASMA - Abnormal; Notable for the following components:   Lactic Acid, Venous 3.0 (*)    All other components within normal limits  BLOOD GAS, VENOUS - Abnormal; Notable for the following components:   pO2, Ven <31.0 (*)    All other components within normal limits  MAGNESIUM - Abnormal; Notable for the following components:   Magnesium 2.8 (*)    All other components within normal limits  CBG MONITORING, ED - Abnormal; Notable for the following components:   Glucose-Capillary >600 (*)    All other components within normal limits  CBG MONITORING, ED - Abnormal; Notable for the following components:   Glucose-Capillary >600 (*)    All other components within normal limits  RESP PANEL BY RT-PCR (FLU A&B, COVID) ARPGX2  ETHANOL  URINALYSIS, ROUTINE W REFLEX MICROSCOPIC  LACTIC ACID, PLASMA  RAPID URINE DRUG SCREEN, HOSP PERFORMED  TSH    EKG EKG Interpretation  Date/Time:  Saturday Nov 03 2020 10:05:57 EDT Ventricular Rate:  107 PR Interval:  167 QRS Duration: 109 QT Interval:  341 QTC Calculation: 455 R Axis:   39 Text Interpretation: Sinus tachycardia Probable left atrial enlargement Anterior infarct, old Borderline repolarization abnormality No significant change was found compared 08/24/19 Confirmed by Fredia Sorrow [phone]) on 11/03/2020 10:17:37 AM   Radiology CT Head Wo Contrast  Result Date: 11/03/2020 CLINICAL DATA:  65 year old male with delirium. Hyperglycemia, reportedly the patient decided to stop taking insulin. Altered mental status. EXAM: CT HEAD WITHOUT CONTRAST TECHNIQUE: Contiguous axial images were obtained from the base of the skull through the vertex without intravenous contrast. COMPARISON:  Brain MRI  08/02/2019.  Head CT 09/08/2019. FINDINGS: Brain: Cerebral volume appears stable since last year, with mild ex vacuo enlargement of the left lateral ventricle related to some periventricular white matter encephalomalacia, including at the anterior external capsule. Superimposed conspicuous and fairly circumscribed new hyperdensity limited to the left basal ganglia on series 6, image 11. No regional edema or mass effect. Stable gray-white matter differentiation elsewhere. Patchy right hemisphere white matter hypodensity. Chronic small dystrophic calcification in the left parietal lobe. No convincing acute intracranial hemorrhage. No cortically based acute infarct identified. No midline shift, mass effect, or evidence of intracranial mass lesion. Normal basilar cisterns. Vascular: No suspicious intracranial vascular hyperdensity. Skull: Stable, negative. Sinuses/Orbits: Visualized paranasal sinuses and mastoids are stable and well aerated. Other: Visualized orbits and scalp soft tissues are within normal limits. IMPRESSION: 1. New circumscribed hyperdensity at the left basal ganglia since last year, with no regional edema or mass effect. 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 2. Otherwise stable CT appearance of chronic small vessel disease since last year. Electronically Signed   By: Genevie Ann M.D.   On: 11/03/2020 10:50   DG Chest Portable 1 View  Result Date:  11/03/2020 CLINICAL DATA:  Confusion. EXAM: PORTABLE CHEST 1 VIEW COMPARISON:  09/13/2019 and older exams. FINDINGS: Cardiac silhouette is normal in size. No mediastinal or hilar masses. Clear lungs. No pleural effusion or pneumothorax. Skeletal structures are grossly intact. IMPRESSION: No active disease. Electronically Signed   By: Lajean Manes M.D.   On: 11/03/2020 10:21    Procedures .Critical Care Performed by: Emeline Darling, PA-C Authorized by: Emeline Darling, PA-C   Critical care provider statement:    Critical care time (minutes):  45   Critical care was necessary to treat or prevent imminent or life-threatening deterioration of the following conditions: HHS.   Critical care was time spent personally by me on the following activities:  Discussions with consultants, evaluation of patient's response to treatment, examination of patient, ordering and performing treatments and interventions, ordering and review of laboratory studies, ordering and review of radiographic studies, pulse oximetry, re-evaluation of patient's condition, obtaining history from patient or surrogate and review of old charts    Medications Ordered in ED Medications  insulin regular, human (MYXREDLIN) 100 units/ 100 mL infusion (has no administration in time range)  lactated ringers infusion (has no administration in time range)  dextrose 5 % in lactated ringers infusion (has no administration in time range)  dextrose 50 % solution 0-50 mL (has no administration in time range)  potassium chloride 10 mEq in 100 mL IVPB (has no administration in time range)  lactated ringers bolus 1,000 mL (has no administration in time range)  lactated ringers bolus 1,770 mL (1,770 mLs Intravenous New Bag/Given 11/03/20 1008)    ED Course  I have reviewed the triage vital signs and the nursing notes.  Pertinent labs & imaging results that were available during my care of the patient were reviewed by me and considered in my medical decision making (see chart for details).  Clinical Course as of 11/03/20 1111  Sat Nov 03, 2020  0102 Collateral history obtained from the patient's wife, [name].  Unfortunately the patient's wife is also a very poor historian.  She states that the patient stopped taking not only his insulin, but all of his medications for an unknown reason approximately 3 months ago.  She states that he became confused on Thursday (48  hours ago), but he did not want her to call the ambulance so she held off until this morning when he became more confused and unable to answer questions.  She states he did have 3 falls yesterday.  She is unsure regarding head trauma. [RS]  1044 RN informed this provider of critical laboratory results for this patient; glucose is greater than 700 at this time and lactic acid is 3.  Still awaiting BMP for potassium level prior to being able to administer Endo tool. [RS]  1109 Consult to hospitalist, Dr. Manuella Ghazi, who is agreeable to seeing the patient and admitting to his service.  Appreciate his collaboration in the care of this patient.  Respiratory pathogen panel and TSH pending. [RS]    Clinical Course User Index [RS] Cyrene Gharibian, Gypsy Balsam, PA-C   MDM Rules/Calculators/A&P                          65 year old male presents with concern for altered mental status and hyperglycemia from home.  Type 2 diabetes, not on medications x3 months.  Differential diagnosis includes but is not limited to hyperglycemia, DKA, HHS, dehydration, sepsis, toxic exposure, ACS.   Borderline hypotensive, tachycardic,  and tachypneic on intake.  Cardiac exam revealed tachycardia, pulmonary exam unremarkable, abdominal exam is benign.  Altered mental status, patient is disoriented.  Alert and oriented only to self and place.  Unable to follow commands consistently.  Neurovascularly intact in all 4 extremities.  Clinically patient is extremely dehydrated.  CBG on intake was greater than 600.  IV fluid bolus started, labs pending.  We will hold off on dental tool and insulin administration until confirmation of VBG and electrolyte levels.  CBC with leukocytosis of 12.6, BMP with hyperglycemia of 739, creatinine of 2.7.  Anion gap of 18 likely secondary to lactic acidosis of 3.  Magnesium appears elevated, 2.8.  Potassium is normal, 4.0.  EKG with sinus tach borderline reported relative but no significant change when compared to  prior.  VBG revealed normal pH of 7.33, suspect HHS rather than DKA given normal pH.  We will start patient on Endo tool for HHS.  Consult placed to hospitalist for admission. Respiratory pathogen panel and TSH pending.  TSH was added on as patient was found to have diagnosis of hypothyroidism on chart review, has not been taking levothyroxine x3 months.  Consult to hospitalist as above.  Patient remains hemodynamically stable at this time.  This chart was dictated using voice recognition software, Dragon. Despite the best efforts of this provider to proofread and correct errors, errors may still occur which can change documentation meaning.  Final Clinical Impression(s) / ED Diagnoses Final diagnoses:  None    Rx / DC Orders ED Discharge Orders    None       Aura Dials 11/03/20 1111    Fredia Sorrow, MD 11/03/20 [phone]

## 2020-11-04 LAB — CBC
HCT: 45.3 % (ref 39.0–52.0)
Hemoglobin: 14.4 g/dL (ref 13.0–17.0)
MCH: 31.5 pg (ref 26.0–34.0)
MCHC: 31.8 g/dL (ref 30.0–36.0)
MCV: 99.1 fL (ref 80.0–100.0)
Platelets: 216 10*3/uL (ref 150–400)
RBC: 4.57 MIL/uL (ref 4.22–5.81)
RDW: 13.3 % (ref 11.5–15.5)
WBC: 13.9 10*3/uL — ABNORMAL HIGH (ref 4.0–10.5)
nRBC: 0 % (ref 0.0–0.2)

## 2020-11-04 LAB — GLUCOSE, CAPILLARY
Glucose-Capillary: 332 mg/dL — ABNORMAL HIGH (ref 70–99)
Glucose-Capillary: 308 mg/dL — ABNORMAL HIGH (ref 70–99)
Glucose-Capillary: 339 mg/dL — ABNORMAL HIGH (ref 70–99)
Glucose-Capillary: 233 mg/dL — ABNORMAL HIGH (ref 70–99)
Glucose-Capillary: 185 mg/dL — ABNORMAL HIGH (ref 70–99)
Glucose-Capillary: 142 mg/dL — ABNORMAL HIGH (ref 70–99)
Glucose-Capillary: 114 mg/dL — ABNORMAL HIGH (ref 70–99)
Glucose-Capillary: 158 mg/dL — ABNORMAL HIGH (ref 70–99)
Glucose-Capillary: 125 mg/dL — ABNORMAL HIGH (ref 70–99)
Glucose-Capillary: 110 mg/dL — ABNORMAL HIGH (ref 70–99)
Glucose-Capillary: 140 mg/dL — ABNORMAL HIGH (ref 70–99)
Glucose-Capillary: 188 mg/dL — ABNORMAL HIGH (ref 70–99)
Glucose-Capillary: 204 mg/dL — ABNORMAL HIGH (ref 70–99)
Glucose-Capillary: 424 mg/dL — ABNORMAL HIGH (ref 70–99)
Glucose-Capillary: 235 mg/dL — ABNORMAL HIGH (ref 70–99)

## 2020-11-04 LAB — COMPREHENSIVE METABOLIC PANEL
ALT: 14 U/L (ref 0–44)
AST: 25 U/L (ref 15–41)
Albumin: 3 g/dL — ABNORMAL LOW (ref 3.5–5.0)
Alkaline Phosphatase: 55 U/L (ref 38–126)
Anion gap: 12 (ref 5–15)
BUN: 34 mg/dL — ABNORMAL HIGH (ref 8–23)
CO2: 27 mmol/L (ref 22–32)
Calcium: 9.3 mg/dL (ref 8.9–10.3)
Chloride: 121 mmol/L — ABNORMAL HIGH (ref 98–111)
Creatinine, Ser: 1.36 mg/dL — ABNORMAL HIGH (ref 0.61–1.24)
GFR, Estimated: 57 mL/min — ABNORMAL LOW (ref >60)
Glucose, Bld: 172 mg/dL — ABNORMAL HIGH (ref 70–99)
Potassium: 3.1 mmol/L — ABNORMAL LOW (ref 3.5–5.1)
Sodium: 160 mmol/L — ABNORMAL HIGH (ref 135–145)
Total Bilirubin: 1.1 mg/dL (ref 0.3–1.2)
Total Protein: 5.8 g/dL — ABNORMAL LOW (ref 6.5–8.1)

## 2020-11-04 LAB — PHOSPHORUS: Phosphorus: 1.6 mg/dL — ABNORMAL LOW (ref 2.5–4.6)

## 2020-11-04 LAB — T4, FREE: Free T4: 1.18 ng/dL — ABNORMAL HIGH (ref 0.61–1.12)

## 2020-11-04 LAB — LACTIC ACID, PLASMA
Lactic Acid, Venous: 3.8 mmol/L (ref 0.5–1.9)
Lactic Acid, Venous: 1.4 mmol/L (ref 0.5–1.9)
Lactic Acid, Venous: 3.8 mmol/L (ref 0.5–1.9)
Lactic Acid, Venous: 1.8 mmol/L (ref 0.5–1.9)

## 2020-11-04 LAB — MAGNESIUM: Magnesium: 2.6 mg/dL — ABNORMAL HIGH (ref 1.7–2.4)

## 2020-11-04 LAB — BETA-HYDROXYBUTYRIC ACID: Beta-Hydroxybutyric Acid: 0.43 mmol/L — ABNORMAL HIGH (ref 0.05–0.27)

## 2020-11-04 LAB — HIV ANTIBODY (ROUTINE TESTING W REFLEX): HIV Screen 4th Generation wRfx: NONREACTIVE

## 2020-11-04 LAB — GLUCOSE, RANDOM: Glucose, Bld: 248 mg/dL — ABNORMAL HIGH (ref 70–99)

## 2020-11-04 MED ORDER — APIXABAN 5 MG PO TABS
5.0000 mg | ORAL_TABLET | Freq: Two times a day (BID) | ORAL | Status: DC
  Administered 2020-11-04 – 2020-11-06 (×5): 5 mg via ORAL
  Filled 2020-11-04 (×5): qty 1

## 2020-11-04 MED ORDER — ROSUVASTATIN CALCIUM 10 MG PO TABS
5.0000 mg | ORAL_TABLET | Freq: Every evening | ORAL | Status: DC
  Administered 2020-11-04 – 2020-11-05 (×2): 5 mg via ORAL
  Filled 2020-11-04 (×2): qty 1

## 2020-11-04 MED ORDER — KCL-LACTATED RINGERS-D5W 20 MEQ/L IV SOLN
INTRAVENOUS | Status: DC
  Filled 2020-11-04 (×4): qty 1000

## 2020-11-04 MED ORDER — INSULIN DETEMIR 100 UNIT/ML ~~LOC~~ SOLN
10.0000 [IU] | Freq: Once | SUBCUTANEOUS | Status: AC
  Administered 2020-11-04: 10 [IU] via SUBCUTANEOUS
  Filled 2020-11-04: qty 0.1

## 2020-11-04 MED ORDER — SODIUM CHLORIDE 0.9 % IV SOLN: Freq: Once | INTRAVENOUS | Status: AC

## 2020-11-04 MED ORDER — POTASSIUM CHLORIDE 10 MEQ/100ML IV SOLN
10.0000 meq | INTRAVENOUS | Status: AC
  Administered 2020-11-04 (×2): 10 meq via INTRAVENOUS
  Filled 2020-11-04 (×2): qty 100

## 2020-11-04 MED ORDER — SODIUM CHLORIDE 0.9 % IV BOLUS
500.0000 mL | Freq: Once | INTRAVENOUS | Status: AC
  Administered 2020-11-04: 500 mL via INTRAVENOUS

## 2020-11-04 MED ORDER — DEXMEDETOMIDINE HCL IN NACL 400 MCG/100ML IV SOLN
0.4000 ug/kg/h | INTRAVENOUS | Status: DC
  Administered 2020-11-04: 0.6 ug/kg/h via INTRAVENOUS
  Administered 2020-11-04: 0.4 ug/kg/h via INTRAVENOUS
  Administered 2020-11-05: 0.5 ug/kg/h via INTRAVENOUS
  Filled 2020-11-04 (×3): qty 100

## 2020-11-04 MED ORDER — HALOPERIDOL LACTATE 5 MG/ML IJ SOLN
2.0000 mg | Freq: Four times a day (QID) | INTRAMUSCULAR | Status: DC | PRN
  Administered 2020-11-04 (×2): 2 mg via INTRAVENOUS
  Filled 2020-11-04 (×2): qty 1

## 2020-11-04 MED ORDER — HALOPERIDOL LACTATE 5 MG/ML IJ SOLN
2.0000 mg | Freq: Four times a day (QID) | INTRAMUSCULAR | Status: DC | PRN
  Administered 2020-11-04 (×2): 2 mg via INTRAMUSCULAR
  Filled 2020-11-04 (×3): qty 1

## 2020-11-04 MED ORDER — INSULIN ASPART 100 UNIT/ML IJ SOLN
0.0000 [IU] | INTRAMUSCULAR | Status: DC
  Administered 2020-11-04: 3 [IU] via SUBCUTANEOUS
  Administered 2020-11-04 (×2): 2 [IU] via SUBCUTANEOUS
  Administered 2020-11-05: 3 [IU] via SUBCUTANEOUS
  Administered 2020-11-05: 9 [IU] via SUBCUTANEOUS
  Administered 2020-11-05: 3 [IU] via SUBCUTANEOUS
  Administered 2020-11-05: 9 [IU] via SUBCUTANEOUS
  Administered 2020-11-05: 5 [IU] via SUBCUTANEOUS
  Administered 2020-11-05: 9 [IU] via SUBCUTANEOUS
  Administered 2020-11-06: 5 [IU] via SUBCUTANEOUS
  Administered 2020-11-06: 2 [IU] via SUBCUTANEOUS

## 2020-11-04 MED ORDER — TAMSULOSIN HCL 0.4 MG PO CAPS
0.4000 mg | ORAL_CAPSULE | Freq: Every day | ORAL | Status: DC
  Administered 2020-11-04 – 2020-11-05 (×2): 0.4 mg via ORAL
  Filled 2020-11-04 (×2): qty 1

## 2020-11-04 NOTE — Progress Notes (Signed)
11/03/2020-10:21 PM RN complained about patient becoming more agitated and trying to remove IV line.  Mittens were placed.  1:35 AM RN notified due to patient becoming agitated again despite the mittens.  He was trying to get out of bed and trying to remove the mittens.  IV Haldol as needed was started

## 2020-11-04 NOTE — Progress Notes (Signed)
Patient has been fidgeting, confused, pulling at lines and tubes, and hallucinating throughout the day, however has been redirectable. At approximately 1430 the pt was attempting to get out of bed, had pulled his gown and EKG leads off and was verbally and physically aggressive. IV haldol was given at 1450 per order with symptoms unchanged. RN received orders to give another dose of haldol IV from Dr. Sherryll Burger at [phone]. NT was present in room to sit 1:1 with him however he continued to be very aggressive. Dr. Sherryll Burger notified and non-violent restraints and a precedex drip was ordered. Family notified of placement of restraints.

## 2020-11-04 NOTE — Plan of Care (Signed)
Discussed with patient plan of care for the evening, pain management and need for restraints with some acceptance but no evidence of learning at this time.  Patient hydrated, face washed, mouth care and he agreed to a second IV site.  Problem: Health Behavior/Discharge Planning: Goal: Ability to manage health-related needs will improve Outcome: Progressing

## 2020-11-04 NOTE — Progress Notes (Signed)
Inpatient Diabetes Program Recommendations  AACE/ADA: New Consensus Statement on Inpatient Glycemic Control   Target Ranges:  Prepandial:   less than 140 mg/dL      Peak postprandial:   less than 180 mg/dL (1-2 hours)      Critically ill patients:  140 - 180 mg/dL   Results for Stanley Levy, Stanley Levy (MRN [id number]) as of 11/04/2020 10:29  Ref. Range 11/03/2020 09:37  Beta-Hydroxybutyric Acid Latest Ref Range: 0.05 - 0.27 mmol/L 4.93 (H)  Glucose Latest Ref Range: 70 - 99 mg/dL 962 (HH)   Review of Glycemic Control  Diabetes history: DM2 Outpatient Diabetes medications: 70/30 20 units BID, Metformin 500 mg QAM (not taken any meds in 3 months per H&P) Current orders for Inpatient glycemic control: IV insulin; Levemir 10 units x1  Inpatient Diabetes Program Recommendations:    Insulin: Noted one time order for Levemir 10 units x1 today and patient also still ordered IV insulin. Once provider is ready to transition off IV insulin, please consider ordering CBGs Q4H and Novolog 0-9 units Q4H. Will need to order Levemir 10 units daily to ensure patient gets basal insulin consistently.  NOTE: Noted consult for Diabetes Coordinator. Diabetes Coordinator is not on campus over the weekend but available by pager from 8am to 5pm for questions or concerns. Chart reviewed. Per H&P, patient has not taken any medications for past 3 months. Initial glucose 739 mg/dl on 03/05/27 and patient was started on IV insulin which is currently still ordered. Noted one time order for Levemir 10 units today. Will need to order basal insulin daily; if Levemir 10 units given today then would recommend ordering Levemir 10 units daily to start 11/05/20.  Thanks, Orlando Penner, RN, MSN, CDE Diabetes Coordinator Inpatient Diabetes Program [phone] (Team Pager from 8am to 5pm)

## 2020-11-04 NOTE — Progress Notes (Signed)
PROGRESS NOTE    Stanley Levy  IRC:[id number] DOB: Apr 04, 1956 DOA: 11/03/2020 PCP: Freddy Finner, NP   Brief Narrative:   Stanley Levy is a 65 y.o. male with medical history significant for chronic combined systolic and diastolic CHF with nonischemic cardiomyopathy with LVEF 25%, LV mural thrombus, dyslipidemia, hypothyroidism, type 2 diabetes, and overall medication noncompliance who presented to the ED today with altered mentation after having stopped all of his medications including his insulin 3 months prior.   Assessment & Plan:   Active Problems:   Acute metabolic encephalopathy   Acute metabolic encephalopathy related to HHS-resolved -Ok to transfer to tele -CT head with new L basal ganglia lesion related to poor DM control -UA negative -Ammonia and VBG WNL -PT evaluation with likely need for placement  HHS in the setting of DM2-resolved -Now to SSI q4 hours -Transition to soft diet -Non-compliance with home meds -Obtain HgA1c and DM coord recommendations appreciated  AKI-likely prerenal vs progression of CKD -Baseline Cr last noted to be 0.9-1 -Continue IVF and monitor  -Related to above  Leukocytosis -Likely reactive -UA pending -CXR negative -Check procal <0.10  Lactic acidosis -Likely related to above -Continue IV fluid and trend  History of chronic combined systolic and diastolic CHF with with LV mural thrombus -LVEF 25% and global hypokinesis noted on 9/21 -Pt has been off anticoagulation, Entresto, digoxin, and Lasix-digoxin resumed; follow Cr prior to [place] and Lasix being resumed -Start Eliquis 5/8 -No acute findings currently  Dyslipidemia/NICM -Resume statin  Hypothyroidism -TSH 6.016 -Check T4 -Resume home levothyroxine  BPH -Flomax to be resumed  Medication non-compliance -Off meds over the last 3 months   DVT prophylaxis: Heparin to Eliquis 5/8 Code Status: Full Family Communication: Discussed with wife  5/7 Disposition Plan:  Status is: Inpatient  Remains inpatient appropriate because:Altered mental status, IV treatments appropriate due to intensity of illness or inability to take PO and Inpatient level of care appropriate due to severity of illness   Dispo: The patient is from: Home              Anticipated d/c is to: SNF              Patient currently is not medically stable to d/c.   Difficult to place patient No  Consultants:   None  Procedures:   See below  Antimicrobials:  Anti-infectives (From admission, onward)   None       Subjective: Patient seen and evaluated today with no new acute complaints or concerns. No acute concerns or events noted overnight.  Objective: Vitals:   11/04/20 1035 11/04/20 1100 11/04/20 1129 11/04/20 1200  BP: (!) 152/85 (!) 118/101  122/74  Pulse: 94 (!) 42 86 86  Resp:  (!) 32 (!) 9 15  Temp:   99.7 F (37.6 C)   TempSrc:   Oral   SpO2:  (!) 89% (!) 80% 97%  Weight:      Height:        Intake/Output Summary (Last 24 hours) at 11/04/2020 1240 Last data filed at 11/04/2020 0900 Gross per 24 hour  Intake 2706.98 ml  Output 800 ml  Net 1906.98 ml   Filed Weights   11/03/20 0917 11/03/20 1343  Weight: 88.5 kg 79.4 kg    Examination:  General exam: Appears calm and comfortable, some confusion at baseline Respiratory system: Clear to auscultation. Respiratory effort normal. On [place] Cardiovascular system: S1 & S2 heard, RRR.  Gastrointestinal system: Abdomen is soft  Central nervous system: Alert and awake Extremities: No edema Skin: No significant lesions noted Psychiatry: Flat affect.    Data Reviewed: I have personally reviewed following labs and imaging studies  CBC: Recent Labs  Lab 11/03/20 0937 11/04/20 0455  WBC 12.6* 13.9*  NEUTROABS 9.4*  --   HGB 16.3 14.4  HCT 51.5 45.3  MCV 99.6 99.1  PLT 245 216   Basic Metabolic Panel: Recent Labs  Lab 11/03/20 0937 11/03/20 0941 11/03/20 1558 11/03/20 2012  11/04/20 0455  NA 146*  --  154* 156* 160*  K 4.0  --  3.3* 3.3* 3.1*  CL 105  --  116* 117* 121*  CO2 23  --  31 26 27   GLUCOSE 739*  --  257* 109* 172*  BUN 49*  --  39* 34* 34*  CREATININE 2.07*  --  1.50* 1.32* 1.36*  CALCIUM 9.0  --  9.2 9.1 9.3  MG  --  2.8*  --   --  2.6*  PHOS  --   --   --   --  1.6*   GFR: Estimated Creatinine Clearance: 53.4 mL/min (A) (by C-G formula based on SCr of 1.36 mg/dL (H)). Liver Function Tests: Recent Labs  Lab 11/04/20 0455  AST 25  ALT 14  ALKPHOS 55  BILITOT 1.1  PROT 5.8*  ALBUMIN 3.0*   No results for input(s): LIPASE, AMYLASE in the last 168 hours. Recent Labs  Lab 11/03/20 1205  AMMONIA <9*   Coagulation Profile: No results for input(s): INR, PROTIME in the last 168 hours. Cardiac Enzymes: No results for input(s): CKTOTAL, CKMB, CKMBINDEX, TROPONINI in the last 168 hours. BNP (last 3 results) No results for input(s): PROBNP in the last 8760 hours. HbA1C: No results for input(s): HGBA1C in the last 72 hours. CBG: Recent Labs  Lab 11/04/20 0631 11/04/20 0759 11/04/20 0912 11/04/20 1043 11/04/20 1154  GLUCAP 114* 158* 125* 110* 140*   Lipid Profile: No results for input(s): CHOL, HDL, LDLCALC, TRIG, CHOLHDL, LDLDIRECT in the last 72 hours. Thyroid Function Tests: Recent Labs    11/03/20 1205  TSH 6.016*   Anemia Panel: No results for input(s): VITAMINB12, FOLATE, FERRITIN, TIBC, IRON, RETICCTPCT in the last 72 hours. Sepsis Labs: Recent Labs  Lab 11/03/20 1205 11/04/20 0455 11/04/20 0740 11/04/20 1017  PROCALCITON <0.10  --   --   --   LATICACIDVEN 3.9* 3.8* 1.4 3.8*    Recent Results (from the past 240 hour(s))  Resp Panel by RT-PCR (Flu A&B, Covid) Nasopharyngeal Swab     Status: None   Collection Time: 11/03/20 12:55 PM   Specimen: Nasopharyngeal Swab; Nasopharyngeal(NP) swabs in vial transport medium  Result Value Ref Range Status   SARS Coronavirus 2 by RT PCR NEGATIVE NEGATIVE Final     Comment: (NOTE) SARS-CoV-2 target nucleic acids are NOT DETECTED.  The SARS-CoV-2 RNA is generally detectable in upper respiratory specimens during the acute phase of infection. The lowest concentration of SARS-CoV-2 viral copies this assay can detect is 138 copies/mL. A negative result does not preclude SARS-Cov-2 infection and should not be used as the sole basis for treatment or other patient management decisions. A negative result may occur with  improper specimen collection/handling, submission of specimen other than nasopharyngeal swab, presence of viral mutation(s) within the areas targeted by this assay, and inadequate number of viral copies(<138 copies/mL). A negative result must be combined with clinical observations, patient history, and epidemiological information. The expected result is Negative.  Fact  Sheet for Patients:  BloggerCourse.com  Fact Sheet for Healthcare Providers:  SeriousBroker.it  This test is no t yet approved or cleared by the [place] FDA and  has been authorized for detection and/or diagnosis of SARS-CoV-2 by FDA under an Emergency Use Authorization (EUA). This EUA will remain  in effect (meaning this test can be used) for the duration of the COVID-19 declaration under Section 564(b)(1) of the Act, 21 U.S.C.section 360bbb-3(b)(1), unless the authorization is terminated  or revoked sooner.       Influenza A by PCR NEGATIVE NEGATIVE Final   Influenza B by PCR NEGATIVE NEGATIVE Final    Comment: (NOTE) The Xpert Xpress SARS-CoV-2/FLU/RSV plus assay is intended as an aid in the diagnosis of influenza from Nasopharyngeal swab specimens and should not be used as a sole basis for treatment. Nasal washings and aspirates are unacceptable for Xpert Xpress SARS-CoV-2/FLU/RSV testing.  Fact Sheet for Patients: BloggerCourse.com  Fact Sheet for Healthcare  Providers: SeriousBroker.it  This test is not yet approved or cleared by the [place] FDA and has been authorized for detection and/or diagnosis of SARS-CoV-2 by FDA under an Emergency Use Authorization (EUA). This EUA will remain in effect (meaning this test can be used) for the duration of the COVID-19 declaration under Section 564(b)(1) of the Act, 21 U.S.C. section 360bbb-3(b)(1), unless the authorization is terminated or revoked.  Performed at [facility], [address], [place], [place] [address]   MRSA PCR Screening     Status: None   Collection Time: 11/03/20  1:39 PM   Specimen: Nasal Mucosa; Nasopharyngeal  Result Value Ref Range Status   MRSA by PCR NEGATIVE NEGATIVE Final    Comment:        The GeneXpert MRSA Assay (FDA approved for NASAL specimens only), is one component of a comprehensive MRSA colonization surveillance program. It is not intended to diagnose MRSA infection nor to guide or monitor treatment for MRSA infections. Performed at [facility], [address], [place], [place] [address]          Radiology Studies: CT Head Wo Contrast  Result Date: 11/03/2020 CLINICAL DATA:  65 year old male with delirium. Hyperglycemia, reportedly the patient decided to stop taking insulin. Altered mental status. EXAM: CT HEAD WITHOUT CONTRAST TECHNIQUE: Contiguous axial images were obtained from the base of the skull through the vertex without intravenous contrast. COMPARISON:  Brain MRI 08/02/2019.  Head CT 09/08/2019. FINDINGS: Brain: Cerebral volume appears stable since last year, with mild ex vacuo enlargement of the left lateral ventricle related to some periventricular white matter encephalomalacia, including at the anterior external capsule. Superimposed conspicuous and fairly circumscribed new hyperdensity limited to the left basal ganglia on series 6, image 11. No regional edema or mass effect. Stable gray-white matter  differentiation elsewhere. Patchy right hemisphere white matter hypodensity. Chronic small dystrophic calcification in the left parietal lobe. No convincing acute intracranial hemorrhage. No cortically based acute infarct identified. No midline shift, mass effect, or evidence of intracranial mass lesion. Normal basilar cisterns. Vascular: No suspicious intracranial vascular hyperdensity. Skull: Stable, negative. Sinuses/Orbits: Visualized paranasal sinuses and mastoids are stable and well aerated. Other: Visualized orbits and scalp soft tissues are within normal limits. IMPRESSION: 1. New circumscribed hyperdensity at the left basal ganglia since last year, with no regional edema or mass effect. 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 2. Otherwise stable CT appearance of chronic small vessel disease since  last year. Electronically Signed   By: Odessa Fleming M.D.   On: 11/03/2020 10:50   DG Chest Portable 1 View  Result Date: 11/03/2020 CLINICAL DATA:  Confusion. EXAM: PORTABLE CHEST 1 VIEW COMPARISON:  09/13/2019 and older exams. FINDINGS: Cardiac silhouette is normal in size. No mediastinal or hilar masses. Clear lungs. No pleural effusion or pneumothorax. Skeletal structures are grossly intact. IMPRESSION: No active disease. Electronically Signed   By: Amie Portland M.D.   On: 11/03/2020 10:21        Scheduled Meds: . Chlorhexidine Gluconate Cloth  6 each Topical Daily  . digoxin  0.125 mg Oral Daily  . heparin  5,000 Units Subcutaneous Q8H  . insulin aspart  0-9 Units Subcutaneous Q4H  . levothyroxine  50 mcg Oral QAC breakfast  . metoprolol succinate  25 mg Oral Daily    LOS: 1 day    Time spent: 35 minutes    Jennifr Gaeta Hoover Brunette, DO [facility]  If 7PM-7AM, please contact night-coverage [email or web address] 11/04/2020, 12:40 PM

## 2020-11-05 LAB — GLUCOSE, CAPILLARY
Glucose-Capillary: 206 mg/dL — ABNORMAL HIGH (ref 70–99)
Glucose-Capillary: 236 mg/dL — ABNORMAL HIGH (ref 70–99)
Glucose-Capillary: 241 mg/dL — ABNORMAL HIGH (ref 70–99)
Glucose-Capillary: 374 mg/dL — ABNORMAL HIGH (ref 70–99)
Glucose-Capillary: 376 mg/dL — ABNORMAL HIGH (ref 70–99)
Glucose-Capillary: 392 mg/dL — ABNORMAL HIGH (ref 70–99)

## 2020-11-05 LAB — BASIC METABOLIC PANEL
Anion gap: 5 (ref 5–15)
Anion gap: 11 (ref 5–15)
BUN: 42 mg/dL — ABNORMAL HIGH (ref 8–23)
BUN: 48 mg/dL — ABNORMAL HIGH (ref 8–23)
CO2: 28 mmol/L (ref 22–32)
CO2: 23 mmol/L (ref 22–32)
Calcium: 8.3 mg/dL — ABNORMAL LOW (ref 8.9–10.3)
Calcium: 7.7 mg/dL — ABNORMAL LOW (ref 8.9–10.3)
Chloride: 124 mmol/L — ABNORMAL HIGH (ref 98–111)
Chloride: 108 mmol/L (ref 98–111)
Creatinine, Ser: 1.75 mg/dL — ABNORMAL HIGH (ref 0.61–1.24)
Creatinine, Ser: 1.74 mg/dL — ABNORMAL HIGH (ref 0.61–1.24)
GFR, Estimated: 42 mL/min — ABNORMAL LOW (ref >60)
GFR, Estimated: 43 mL/min — ABNORMAL LOW (ref >60)
Glucose, Bld: 230 mg/dL — ABNORMAL HIGH (ref 70–99)
Glucose, Bld: 399 mg/dL — ABNORMAL HIGH (ref 70–99)
Potassium: 3.6 mmol/L (ref 3.5–5.1)
Potassium: 3.5 mmol/L (ref 3.5–5.1)
Sodium: 157 mmol/L — ABNORMAL HIGH (ref 135–145)
Sodium: 142 mmol/L (ref 135–145)

## 2020-11-05 LAB — CBC
HCT: 40 % (ref 39.0–52.0)
Hemoglobin: 12.1 g/dL — ABNORMAL LOW (ref 13.0–17.0)
MCH: 30.9 pg (ref 26.0–34.0)
MCHC: 30.3 g/dL (ref 30.0–36.0)
MCV: 102.3 fL — ABNORMAL HIGH (ref 80.0–100.0)
Platelets: 136 10*3/uL — ABNORMAL LOW (ref 150–400)
RBC: 3.91 MIL/uL — ABNORMAL LOW (ref 4.22–5.81)
RDW: 13.4 % (ref 11.5–15.5)
WBC: 6.8 10*3/uL (ref 4.0–10.5)
nRBC: 0 % (ref 0.0–0.2)

## 2020-11-05 LAB — LACTIC ACID, PLASMA
Lactic Acid, Venous: 1.5 mmol/L (ref 0.5–1.9)
Lactic Acid, Venous: 1.9 mmol/L (ref 0.5–1.9)

## 2020-11-05 LAB — MAGNESIUM: Magnesium: 2.3 mg/dL (ref 1.7–2.4)

## 2020-11-05 MED ORDER — INSULIN DETEMIR 100 UNIT/ML ~~LOC~~ SOLN
10.0000 [IU] | Freq: Every day | SUBCUTANEOUS | Status: DC
  Administered 2020-11-05: 10 [IU] via SUBCUTANEOUS
  Filled 2020-11-05 (×3): qty 0.1

## 2020-11-05 MED ORDER — SODIUM CHLORIDE 0.45 % IV SOLN: INTRAVENOUS | Status: DC

## 2020-11-05 NOTE — Plan of Care (Signed)
  Problem: Acute Rehab PT Goals(only PT should resolve) Goal: Pt Will Go Supine/Side To Sit Outcome: Progressing Flowsheets (Taken 11/05/2020 1346) Pt will go Supine/Side to Sit: with modified independence Goal: Pt Will Go Sit To Supine/Side Outcome: Progressing Flowsheets (Taken 11/05/2020 1346) Pt will go Sit to Supine/Side: with modified independence Goal: Patient Will Transfer Sit To/From Stand Outcome: Progressing Flowsheets (Taken 11/05/2020 1346) Patient will transfer sit to/from stand: with supervision Goal: Pt Will Transfer Bed To Chair/Chair To Bed Outcome: Progressing Flowsheets (Taken 11/05/2020 1346) Pt will Transfer Bed to Chair/Chair to Bed: with supervision Goal: Pt Will Ambulate Outcome: Progressing Flowsheets (Taken 11/05/2020 1346) Pt will Ambulate:  > 125 feet  with supervision  with least restrictive assistive device   Britta Mccreedy D. Hartnett-Rands, MS, PT [facility] [phone] 11/05/2020

## 2020-11-05 NOTE — Progress Notes (Signed)
Discussed via chat with on-call MD patients spiking temperature, low blood pressure and need for NS bolus since patient is dry.  Recheck of Lactic acid under 2.0 and WBC increased.  Patient on no antibiotics at this time but blood cultures were obtained.  Patient is on Precedex but needs the dose he is on due to agitation and combative behavior even in restraints.

## 2020-11-05 NOTE — Evaluation (Signed)
Physical Therapy Evaluation Patient Details Name: Stanley Levy MRN: [id number] DOB: [birth year] Today's Date: 11/05/2020   History of Present Illness  Stanley Levy is a 65 y.o. male with medical history significant for chronic combined systolic and diastolic CHF with nonischemic cardiomyopathy with LVEF 25%, LV mural thrombus, dyslipidemia, hypothyroidism, type 2 diabetes, and overall medication noncompliance who presented to the ED today with altered mentation after having stopped all of his medications including his insulin 3 months prior.    Clinical Impression  Pt admitted with above diagnosis.Sit to stand and stand/pivot transfers unsteady without assistive device with loss of balance back to chair with sit to stand, impulsive movements; cues for sequencing of steps and placement of hands on RW. Ambualtion without assistive device - quick steps, impulsive movements, decreased ability to ambulate in straight path when turning corners and head movements side to side. This is improved with use of RW. Unclear of patient's baseline safety awareness. Pt currently with functional limitations due to the deficits listed below (see PT Problem List). Pt will benefit from skilled PT to increase their independence and safety with mobility to allow discharge to the venue listed below.       Follow Up Recommendations Home health PT;Supervision/Assistance - 24 hour    Equipment Recommendations  Rolling walker with 5" wheels    Recommendations for Other Services       Precautions / Restrictions Precautions Precautions: Fall Precaution Comments: patient reports no falls in the last six months Restrictions Weight Bearing Restrictions: No      Mobility  Bed Mobility Overal bed mobility: Needs Assistance Bed Mobility: Supine to Sit     Supine to sit: Supervision     General bed mobility comments: exhibits impulsive movements; supervision for safety    Transfers Overall transfer level:  Needs assistance Equipment used: Rolling walker (2 wheeled);None Transfers: Sit to/from [employer] to Stand: Min guard Stand pivot transfers: Min guard       General transfer comment: unsteady without assistive device with loss of balance back to chair with sit to stand, impulsive movements; cues for sequencing of steps and placement of hands on RW  Ambulation/Gait Ambulation/Gait assistance: Min guard Gait Distance (Feet): 200 Feet (x1 without assistive device; x1 with RW) Assistive device: Rolling walker (2 wheeled);None;IV Pole Gait Pattern/deviations: Step-through pattern;Decreased step length - right;Decreased step length - left;Drifts right/left;Wide base of support     General Gait Details: without assistive device - quick steps, impulsive movements, decreased ability to ambulate in straight path when turning corners and head movements side to side; this is improved with use of RW; on room air  Stairs     Wheelchair Mobility    Modified Rankin (Stroke Patients Only)       Balance Overall balance assessment: Needs assistance Sitting-balance support: No upper extremity supported;Feet supported Sitting balance-Leahy Scale: Good     Standing balance support: No upper extremity supported;During functional activity Standing balance-Leahy Scale: Poor Standing balance comment: fair with RW        Pertinent Vitals/Pain Pain Assessment: No/denies pain    Home Living Family/patient expects to be discharged to:: Private residence Living Arrangements: Spouse/significant other Available Help at Discharge: Family;Available 24 hours/day Type of Home: House Home Access: Stairs to enter Entrance Stairs-Rails: None Entrance Stairs-Number of Steps: 3 Home Layout: One level Home Equipment: Cane - single point      Prior Function Level of Independence: Independent         Comments:  retired; sister-in-law has been doing Curator   Dominant Hand: Left    Extremity/Trunk Assessment   Upper Extremity Assessment Upper Extremity Assessment: Overall WFL for tasks assessed    Lower Extremity Assessment Lower Extremity Assessment: Overall WFL for tasks assessed    Cervical / Trunk Assessment Cervical / Trunk Assessment: Normal  Communication      Cognition Arousal/Alertness: Awake/alert Behavior During Therapy: WFL for tasks assessed/performed Overall Cognitive Status: No family/caregiver present to determine baseline cognitive functioning      General Comments      Exercises     Assessment/Plan    PT Assessment Patient needs continued PT services  PT Problem List Decreased safety awareness;Decreased knowledge of precautions;Decreased activity tolerance;Decreased balance;Decreased knowledge of use of DME       PT Treatment Interventions DME instruction;Therapeutic exercise;Gait training;Balance training;Neuromuscular re-education;Functional mobility training;Cognitive remediation;Therapeutic activities;Patient/family education    PT Goals (Current goals can be found in the Care Plan section)  Acute Rehab PT Goals Patient Stated Goal: Go home. PT Goal Formulation: With patient Time For Goal Achievement: 11/19/20 Potential to Achieve Goals: Fair    Frequency Min 3X/week   Barriers to discharge           AM-PAC PT "6 Clicks" Mobility  Outcome Measure Help needed turning from your back to your side while in a flat bed without using bedrails?: None Help needed moving from lying on your back to sitting on the side of a flat bed without using bedrails?: A Little Help needed moving to and from a bed to a chair (including a wheelchair)?: A Little Help needed standing up from a chair using your arms (e.g., wheelchair or bedside chair)?: A Little Help needed to walk in hospital room?: A Little Help needed climbing 3-5 steps with a railing? : A Little 6 Click Score: 19    End of Session  Equipment Utilized During Treatment: Gait belt Activity Tolerance: Patient tolerated treatment well Patient left: in chair;with nursing/sitter in room Nurse Communication: Mobility status PT Visit Diagnosis: Unsteadiness on feet (R26.81);Other abnormalities of gait and mobility (R26.89);Difficulty in walking, not elsewhere classified (R26.2)    Time: 1225-1300 PT Time Calculation (min) (ACUTE ONLY): 35 min   Charges:   PT Evaluation $PT Eval Low Complexity: 1 Low PT Treatments $Gait Training: 8-22 mins        Katina Dung. Hartnett-Rands, MS, PT [facility]  [phone]  Britta Mccreedy  Hartnett-Rands 11/05/2020, 1:43 PM

## 2020-11-05 NOTE — Progress Notes (Signed)
Inpatient Diabetes Program Recommendations  AACE/ADA: New Consensus Statement on Inpatient Glycemic Control (2015)  Target Ranges:  Prepandial:   less than 140 mg/dL      Peak postprandial:   less than 180 mg/dL (1-2 hours)      Critically ill patients:  140 - 180 mg/dL   Lab Results  Component Value Date   GLUCAP 241 (H) 11/05/2020   HGBA1C 12.9 (A) 01/12/2020   HGBA1C 12.9 01/12/2020   HGBA1C 12.9 (A) 01/12/2020   HGBA1C 12.9 (A) 01/12/2020    Review of Glycemic Control Results for Stanley Levy, Stanley Levy (MRN [id number]) as of 11/05/2020 08:52  Ref. Range 11/04/2020 21:03 11/04/2020 21:41 11/05/2020 00:15 11/05/2020 04:28 11/05/2020 07:41  Glucose-Capillary Latest Ref Range: 70 - 99 mg/dL 449 (H) 753 (H) 005 (H) 236 (H) 241 (H)   Diabetes history: DM2 Outpatient Diabetes medications: 70/30 20 units BID, Metformin 500 mg QAM (not taken any meds in 3 months per H&P) Current orders for Inpatient glycemic control: Novolog 0-9 units q 4 hrs.  Inpatient Diabetes Program Recommendations:   -Add Levemir 10 units daily Secure chat sent to Dr. Sherryll Burger.  Thank you, Billy Fischer. Jedrek Dinovo, RN, MSN, CDE  Diabetes Coordinator Inpatient Glycemic Control Team Team Pager [phone] (8am-5pm) 11/05/2020 8:59 AM

## 2020-11-05 NOTE — Progress Notes (Signed)
PROGRESS NOTE    Stanley Levy  ZOX:[id number] DOB: 1956-04-21 DOA: 11/03/2020 PCP: Stanley Finner, NP   Brief Narrative:   Stanley Ewart Kingis a 65 y.o.malewith medical history significant forchronic combined systolic and diastolic CHF with nonischemic cardiomyopathy with LVEF 25%, LV mural thrombus, dyslipidemia, hypothyroidism, type 2 diabetes, and overall medication noncompliance who presented to the ED today with altered mentation after having stopped all of his medications including his insulin 3 months prior.  Assessment & Plan:   Active Problems:   Acute metabolic encephalopathy   Acute metabolic encephalopathy related to HHS, now with hypernatremia -Noted to have behavioral disturbances and agitation overnight requiring restraints and precedex drip -1/2NS initiated and will follow labs -CT head with new L basal ganglia lesion related to poor DM control -UA negative -Ammonia and VBG WNL -PT evaluation with likely need for placement  HHS in the setting of DM2-resolved -Now to SSI q4 hours -Transition to soft diet -Non-compliance with home meds -Obtain HgA1c and DM coord recommendations appreciated -Start Levemir 10U daily on 5/9  AKI-likely prerenal vs progression of CKD -Baseline Cr last noted to be 0.9-1 -Continue IVF and monitor  -Related to above  History of chronic combined systolic and diastolic CHF with with LV mural thrombus -LVEF 25% and global hypokinesisnoted on 9/21 -Pt has been off anticoagulation, Entresto, digoxin, and Lasix-digoxin resumed; follow Cr prior to [place] and Lasix being resumed -Start Eliquis 5/8 -No acute findings currently  Dyslipidemia/NICM -Resume statin  Hypothyroidism -TSH 6.016 -Check T4 elevated at 1.18 -Hold levothyroxine today  BPH -Flomax to be resumed  Medication non-compliance -Off meds over the last 3 months   DVT prophylaxis: Heparin to Eliquis 5/8 Code Status: Full Family Communication:  Discussed with wife 5/7 Disposition Plan:  Status is: Inpatient  Remains inpatient appropriate because:Altered mental status, IV treatments appropriate due to intensity of illness or inability to take PO and Inpatient level of care appropriate due to severity of illness   Dispo: The patient is from: Home  Anticipated d/c is to: SNF  Patient currently is not medically stable to d/c.              Difficult to place patient No  Consultants:   None  Procedures:   See below  Antimicrobials:   None   Subjective: Patient seen and evaluated today and seems to be less confused.  He is still in restraints and on Precedex.  He was noted to have some significant agitation and confusion yesterday afternoon.  Objective: Vitals:   11/05/20 0800 11/05/20 0852 11/05/20 1000 11/05/20 1110  BP:  115/71 (!) 89/48 120/64  Pulse:  63 (!) 56 85  Resp:   18 13  Temp: 97.9 F (36.6 C)     TempSrc: Axillary     SpO2:   100%   Weight:      Height:        Intake/Output Summary (Last 24 hours) at 11/05/2020 1147 Last data filed at 11/05/2020 1122 Gross per 24 hour  Intake 2094.76 ml  Output 875 ml  Net 1219.76 ml   Filed Weights   11/03/20 0917 11/03/20 1343  Weight: 88.5 kg 79.4 kg    Examination:  General exam: Appears calm and comfortable, more coherent Respiratory system: Clear to auscultation. Respiratory effort normal. Cardiovascular system: S1 & S2 heard, RRR.  Gastrointestinal system: Abdomen is soft Central nervous system: Alert and awake Extremities: No edema Skin: No significant lesions noted Psychiatry: Flat affect.  Data Reviewed: I have personally reviewed following labs and imaging studies  CBC: Recent Labs  Lab 11/03/20 0937 11/04/20 0455 11/05/20 0356  WBC 12.6* 13.9* 6.8  NEUTROABS 9.4*  --   --   HGB 16.3 14.4 12.1*  HCT 51.5 45.3 40.0  MCV 99.6 99.1 102.3*  PLT 245 216 136*   Basic Metabolic Panel: Recent Labs  Lab  11/03/20 0937 11/03/20 0941 11/03/20 1558 11/03/20 2012 11/04/20 0455 11/04/20 2151 11/05/20 0356  NA 146*  --  154* 156* 160*  --  157*  K 4.0  --  3.3* 3.3* 3.1*  --  3.6  CL 105  --  116* 117* 121*  --  124*  CO2 23  --  31 26 27   --  28  GLUCOSE 739*  --  257* 109* 172* 248* 230*  BUN 49*  --  39* 34* 34*  --  42*  CREATININE 2.07*  --  1.50* 1.32* 1.36*  --  1.75*  CALCIUM 9.0  --  9.2 9.1 9.3  --  8.3*  MG  --  2.8*  --   --  2.6*  --  2.3  PHOS  --   --   --   --  1.6*  --   --    GFR: Estimated Creatinine Clearance: 41.5 mL/min (A) (by C-G formula based on SCr of 1.75 mg/dL (H)). Liver Function Tests: Recent Labs  Lab 11/04/20 0455  AST 25  ALT 14  ALKPHOS 55  BILITOT 1.1  PROT 5.8*  ALBUMIN 3.0*   No results for input(s): LIPASE, AMYLASE in the last 168 hours. Recent Labs  Lab 11/03/20 1205  AMMONIA <9*   Coagulation Profile: No results for input(s): INR, PROTIME in the last 168 hours. Cardiac Enzymes: No results for input(s): CKTOTAL, CKMB, CKMBINDEX, TROPONINI in the last 168 hours. BNP (last 3 results) No results for input(s): PROBNP in the last 8760 hours. HbA1C: No results for input(s): HGBA1C in the last 72 hours. CBG: Recent Labs  Lab 11/04/20 2103 11/04/20 2141 11/05/20 0015 11/05/20 0428 11/05/20 0741  GLUCAP 424* 235* 206* 236* 241*   Lipid Profile: No results for input(s): CHOL, HDL, LDLCALC, TRIG, CHOLHDL, LDLDIRECT in the last 72 hours. Thyroid Function Tests: Recent Labs    11/03/20 1205 11/04/20 0740  TSH 6.016*  --   FREET4  --  1.18*   Anemia Panel: No results for input(s): VITAMINB12, FOLATE, FERRITIN, TIBC, IRON, RETICCTPCT in the last 72 hours. Sepsis Labs: Recent Labs  Lab 11/03/20 1205 11/04/20 0455 11/04/20 1017 11/04/20 2151 11/05/20 0019 11/05/20 0356  PROCALCITON <0.10  --   --   --   --   --   LATICACIDVEN 3.9*   < > 3.8* 1.8 1.9 1.5   < > = values in this interval not displayed.    Recent Results  (from the past 240 hour(s))  Resp Panel by RT-PCR (Flu A&B, Covid) Nasopharyngeal Swab     Status: None   Collection Time: 11/03/20 12:55 PM   Specimen: Nasopharyngeal Swab; Nasopharyngeal(NP) swabs in vial transport medium  Result Value Ref Range Status   SARS Coronavirus 2 by RT PCR NEGATIVE NEGATIVE Final    Comment: (NOTE) SARS-CoV-2 target nucleic acids are NOT DETECTED.  The SARS-CoV-2 RNA is generally detectable in upper respiratory specimens during the acute phase of infection. The lowest concentration of SARS-CoV-2 viral copies this assay can detect is 138 copies/mL. A negative result does not preclude SARS-Cov-2 infection and  should not be used as the sole basis for treatment or other patient management decisions. A negative result may occur with  improper specimen collection/handling, submission of specimen other than nasopharyngeal swab, presence of viral mutation(s) within the areas targeted by this assay, and inadequate number of viral copies(<138 copies/mL). A negative result must be combined with clinical observations, patient history, and epidemiological information. The expected result is Negative.  Fact Sheet for Patients:  BloggerCourse.com  Fact Sheet for Healthcare Providers:  SeriousBroker.it  This test is no t yet approved or cleared by the [place] FDA and  has been authorized for detection and/or diagnosis of SARS-CoV-2 by FDA under an Emergency Use Authorization (EUA). This EUA will remain  in effect (meaning this test can be used) for the duration of the COVID-19 declaration under Section 564(b)(1) of the Act, 21 U.S.C.section 360bbb-3(b)(1), unless the authorization is terminated  or revoked sooner.       Influenza A by PCR NEGATIVE NEGATIVE Final   Influenza B by PCR NEGATIVE NEGATIVE Final    Comment: (NOTE) The Xpert Xpress SARS-CoV-2/FLU/RSV plus assay is intended as an aid in the  diagnosis of influenza from Nasopharyngeal swab specimens and should not be used as a sole basis for treatment. Nasal washings and aspirates are unacceptable for Xpert Xpress SARS-CoV-2/FLU/RSV testing.  Fact Sheet for Patients: BloggerCourse.com  Fact Sheet for Healthcare Providers: SeriousBroker.it  This test is not yet approved or cleared by the [place] FDA and has been authorized for detection and/or diagnosis of SARS-CoV-2 by FDA under an Emergency Use Authorization (EUA). This EUA will remain in effect (meaning this test can be used) for the duration of the COVID-19 declaration under Section 564(b)(1) of the Act, 21 U.S.C. section 360bbb-3(b)(1), unless the authorization is terminated or revoked.  Performed at [facility], [address], [place], [place] [address]   MRSA PCR Screening     Status: None   Collection Time: 11/03/20  1:39 PM   Specimen: Nasal Mucosa; Nasopharyngeal  Result Value Ref Range Status   MRSA by PCR NEGATIVE NEGATIVE Final    Comment:        The GeneXpert MRSA Assay (FDA approved for NASAL specimens only), is one component of a comprehensive MRSA colonization surveillance program. It is not intended to diagnose MRSA infection nor to guide or monitor treatment for MRSA infections. Performed at [facility], [address], [place], [place] [address]   Culture, blood (Routine X 2) w Reflex to ID Panel     Status: None (Preliminary result)   Collection Time: 11/05/20 12:20 AM   Specimen: BLOOD LEFT HAND  Result Value Ref Range Status   Specimen Description BLOOD LEFT HAND  Final   Special Requests   Final    BOTTLES DRAWN AEROBIC AND ANAEROBIC Blood Culture adequate volume   Culture   Final    NO GROWTH < 12 HOURS Performed at [facility] [address], [place], [place] [address]    Report Status PENDING  Incomplete  Culture, blood (Routine X 2) w Reflex to ID Panel     Status: None  (Preliminary result)   Collection Time: 11/05/20 12:20 AM   Specimen: BLOOD RIGHT HAND  Result Value Ref Range Status   Specimen Description BLOOD RIGHT HAND  Final   Special Requests   Final    BOTTLES DRAWN AEROBIC AND ANAEROBIC Blood Culture adequate volume   Culture   Final    NO GROWTH < 12 HOURS Performed at  [facility], [address] [place], [place] [address]    Report Status PENDING  Incomplete         Radiology Studies: No results found.      Scheduled Meds: . apixaban  5 mg Oral BID  . Chlorhexidine Gluconate Cloth  6 each Topical Daily  . digoxin  0.125 mg Oral Daily  . insulin aspart  0-9 Units Subcutaneous Q4H  . insulin detemir  10 Units Subcutaneous Daily  . metoprolol succinate  25 mg Oral Daily  . rosuvastatin  5 mg Oral QPM  . tamsulosin  0.4 mg Oral QPC supper   Continuous Infusions: . sodium chloride 100 mL/hr at 11/05/20 0858  . dexmedetomidine (PRECEDEX) IV infusion Stopped (11/05/20 1121)     LOS: 2 days    Time spent: 35 minutes    Noriko Macari D Sherryll Burger, DO [facility]  If 7PM-7AM, please contact night-coverage [email or web address] 11/05/2020, 11:47 AM

## 2020-11-06 LAB — GLUCOSE, CAPILLARY
Glucose-Capillary: 104 mg/dL — ABNORMAL HIGH (ref 70–99)
Glucose-Capillary: 113 mg/dL — ABNORMAL HIGH (ref 70–99)
Glucose-Capillary: 155 mg/dL — ABNORMAL HIGH (ref 70–99)
Glucose-Capillary: 257 mg/dL — ABNORMAL HIGH (ref 70–99)
Glucose-Capillary: 84 mg/dL (ref 70–99)

## 2020-11-06 LAB — BASIC METABOLIC PANEL
Anion gap: 10 (ref 5–15)
BUN: 37 mg/dL — ABNORMAL HIGH (ref 8–23)
CO2: 25 mmol/L (ref 22–32)
Calcium: 8.2 mg/dL — ABNORMAL LOW (ref 8.9–10.3)
Chloride: 118 mmol/L — ABNORMAL HIGH (ref 98–111)
Creatinine, Ser: 1.3 mg/dL — ABNORMAL HIGH (ref 0.61–1.24)
GFR, Estimated: >60 mL/min (ref >60)
Glucose, Bld: 105 mg/dL — ABNORMAL HIGH (ref 70–99)
Potassium: 4 mmol/L (ref 3.5–5.1)
Sodium: 153 mmol/L — ABNORMAL HIGH (ref 135–145)

## 2020-11-06 LAB — CBC
HCT: 42.4 % (ref 39.0–52.0)
Hemoglobin: 13 g/dL (ref 13.0–17.0)
MCH: 31.1 pg (ref 26.0–34.0)
MCHC: 30.7 g/dL (ref 30.0–36.0)
MCV: 101.4 fL — ABNORMAL HIGH (ref 80.0–100.0)
Platelets: 135 10*3/uL — ABNORMAL LOW (ref 150–400)
RBC: 4.18 MIL/uL — ABNORMAL LOW (ref 4.22–5.81)
RDW: 12.5 % (ref 11.5–15.5)
WBC: 7.7 10*3/uL (ref 4.0–10.5)
nRBC: 0 % (ref 0.0–0.2)

## 2020-11-06 LAB — MAGNESIUM: Magnesium: 2 mg/dL (ref 1.7–2.4)

## 2020-11-06 LAB — HEMOGLOBIN A1C
Hgb A1c MFr Bld: >15.5 % — ABNORMAL HIGH (ref 4.8–5.6)
Mean Plasma Glucose: >398 mg/dL

## 2020-11-06 MED ORDER — ESCITALOPRAM OXALATE 10 MG PO TABS: 10.0000 mg | ORAL_TABLET | Freq: Every day | ORAL | 2 refills | Status: AC

## 2020-11-06 MED ORDER — ENTRESTO 24-26 MG PO TABS: 1.0000 | ORAL_TABLET | Freq: Two times a day (BID) | ORAL | 6 refills | Status: DC

## 2020-11-06 MED ORDER — LEVOTHYROXINE SODIUM 50 MCG PO TABS: 50.0000 ug | ORAL_TABLET | Freq: Every day | ORAL | 1 refills | Status: DC

## 2020-11-06 MED ORDER — GABAPENTIN 100 MG PO CAPS: 100.0000 mg | ORAL_CAPSULE | Freq: Three times a day (TID) | ORAL | 2 refills | Status: AC

## 2020-11-06 MED ORDER — METOPROLOL SUCCINATE ER 25 MG PO TB24: 25.0000 mg | ORAL_TABLET | Freq: Every day | ORAL | 3 refills | Status: AC

## 2020-11-06 MED ORDER — SPIRONOLACTONE 25 MG PO TABS: 12.5000 mg | ORAL_TABLET | Freq: Every day | ORAL | 3 refills | Status: DC

## 2020-11-06 MED ORDER — NOVOLOG MIX 70/30 FLEXPEN (70-30) 100 UNIT/ML ~~LOC~~ SUPN
20.0000 [IU] | PEN_INJECTOR | Freq: Two times a day (BID) | SUBCUTANEOUS | 3 refills | Status: DC
Start: 2020-11-06 — End: 2020-12-03

## 2020-11-06 MED ORDER — TAMSULOSIN HCL 0.4 MG PO CAPS: 0.4000 mg | ORAL_CAPSULE | Freq: Every day | ORAL | 3 refills | Status: AC

## 2020-11-06 MED ORDER — ROSUVASTATIN CALCIUM 5 MG PO TABS: 5.0000 mg | ORAL_TABLET | Freq: Every evening | ORAL | 3 refills | Status: DC

## 2020-11-06 MED ORDER — LEVOTHYROXINE SODIUM 25 MCG PO TABS: 25.0000 ug | ORAL_TABLET | Freq: Every day | ORAL | 2 refills | Status: AC

## 2020-11-06 MED ORDER — METFORMIN HCL ER 500 MG PO TB24: 500.0000 mg | ORAL_TABLET | Freq: Every day | ORAL | 3 refills | Status: DC

## 2020-11-06 MED ORDER — DIGOXIN 125 MCG PO TABS: 0.1250 mg | ORAL_TABLET | Freq: Every day | ORAL | 3 refills | Status: AC

## 2020-11-06 MED ORDER — APIXABAN 5 MG PO TABS: 5.0000 mg | ORAL_TABLET | Freq: Two times a day (BID) | ORAL | 3 refills | Status: AC

## 2020-11-06 NOTE — TOC Initial Note (Signed)
Transition of Care [facility]) - Initial/Assessment Note    Patient Details  Name: Stanley Levy MRN: [id number] Date of Birth: 05-22-1956  Transition of Care [facility]) CM/SW Contact:    Karn Cassis, LCSW Phone Number: 11/06/2020, 12:26 PM  Clinical Narrative:  Pt admitted due to acute metabolic encephalopathy. LCSW completed assessment with pt's wife. She reports she is with him around the clock. Pt's wife has had 2 strokes in the past and reports she has some limitations as well. Pt requires assist with bathing. PT evaluated pt and recommend home health and rolling walker. Discussed with pt's wife who is agreeable. She requests HHRN as well for medication compliance. Referred to [facility] who accepts. Rolling walker referred to [facility] and will be delivered to hospital room today. RN aware. Discussed ALF information for future and pt's wife became tearful and said she isn't ready to review any of that yet. She wants pt to return home for as long as they can manage. Support provided. D/C today.   Expected Discharge Plan: Home w Home Health Services Barriers to Discharge: Barriers Resolved   Patient Goals and CMS Choice Patient states their goals for this hospitalization and ongoing recovery are:: return home   Choice offered to / list presented to : Spouse  Expected Discharge Plan and Services Expected Discharge Plan: Home w Home Health Services In-house Referral: Clinical Social Work   Post Acute Care Choice: Home Health Living arrangements for the past 2 months: Single Family Home Expected Discharge Date: 11/06/20               DME Arranged: Dan Humphreys rolling DME Agency: [facility] Date DME Agency Contacted: 11/06/20 Time DME Agency Contacted: 1226 Representative spoke with at DME Agency: Velna Hatchet HH Arranged: PT,RN HH Agency: [facility] Date [facility] Agency Contacted: 11/06/20 Time HH Agency Contacted: 1226 Representative spoke with at [facility] Agency: Kandee Keen  Prior Living  Arrangements/Services Living arrangements for the past 2 months: Single Family Home Lives with:: Spouse Patient language and need for interpreter reviewed:: Yes Do you feel safe going back to the place where you live?: Yes      Need for Family Participation in Patient Care: Yes (Comment) Care giver support system in place?: Yes (comment) Current home services: DME (cane) Criminal Activity/Legal Involvement Pertinent to Current Situation/Hospitalization: No - Comment as needed  Activities of Daily Living Home Assistive Devices/Equipment: None ADL Screening (condition at time of admission) Patient's cognitive ability adequate to safely complete daily activities?: No Is the patient deaf or have difficulty hearing?: No Does the patient have difficulty seeing, even when wearing glasses/contacts?: No Does the patient have difficulty concentrating, remembering, or making decisions?: Yes Patient able to express need for assistance with ADLs?: Yes Does the patient have difficulty dressing or bathing?: Yes Independently performs ADLs?: No Communication: Independent Dressing (OT): Needs assistance Is this a change from baseline?: Change from baseline, expected to last <3days Grooming: Needs assistance Is this a change from baseline?: Change from baseline, expected to last <3 days Feeding: Needs assistance Is this a change from baseline?: Change from baseline, expected to last <3 days Bathing: Needs assistance Is this a change from baseline?: Change from baseline, expected to last <3 days Toileting: Needs assistance Is this a change from baseline?: Change from baseline, expected to last <3 days In/Out Bed: Needs assistance Is this a change from baseline?: Change from baseline, expected to last <3 days Walks in Home: Independent Does the patient have difficulty walking or climbing stairs?:  No Weakness of Legs: Both Weakness of Arms/Hands: Both  Permission Sought/Granted                   Emotional Assessment       Orientation: : Oriented to Self,Oriented to Place Alcohol / Substance Use: Not Applicable Psych Involvement: No (comment)  Admission diagnosis:  Acute metabolic encephalopathy [G93.41] Patient Active Problem List   Diagnosis Date Noted  . Acute metabolic encephalopathy 11/03/2020  . Nonproliferative diabetic retinopathy (HCC) 12/27/2019  . Pressure injury of skin 09/21/2019  . At high risk for injury related to fall 09/08/2019  . Overweight with body mass index (BMI) of 28 to 28.9 in adult 09/08/2019  . Goals of care, counseling/discussion   . Advanced care planning/counseling discussion   . Palliative care by specialist   . Hypothyroidism 08/18/2019  . Cognitive changes/memory Challenges 08/18/2019  . LV (left ventricular) mural thrombus   . Severe left ventricular systolic dysfunction   . Mixed hyperlipidemia   . Memory loss 08/16/2019  . Falls frequently 08/16/2019  . Fatigue 08/16/2019  . Essential hypertension, benign 08/16/2019  . Elevated TSH 08/16/2019  . Uncontrolled type 2 diabetes mellitus with hyperglycemia (HCC) 08/16/2019  . 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 % 08/16/2019  . CHF (congestive heart failure) (HCC) 08/15/2019  . Encounter for medication management 08/12/2019  . Hyperglycemia due to diabetes mellitus (HCC) 08/01/2019   PCP:  Freddy Finner, NP Pharmacy:   [phone] - [place], [place] - [address] AT [facility] [address] [place] [address] Phone: [phone] Fax: [phone]     Social Determinants of Health (SDOH) Interventions    Readmission Risk Interventions Readmission Risk Prevention Plan 11/06/2020 09/22/2019 08/02/2019  Post Dischage Appt - - Complete  Medication Screening - - Complete  Transportation Screening Complete Complete Complete  PCP or Specialist Appt  within 3-5 Days - Complete -  HRI or Home Care Consult Complete Complete -  Social Work Consult for Recovery Care Planning/Counseling Complete Complete -  Palliative Care Screening Not Applicable Complete -  Medication Review Oceanographer) Complete Complete -  Some recent data might be hidden

## 2020-11-06 NOTE — TOC Transition Note (Signed)
Transition of Care [facility]) - CM/SW Discharge Note   Patient Details  Name: Stanley Levy MRN: [id number] Date of Birth: [birth year]  Transition of Care [facility]) CM/SW Contact:  Karn Cassis, LCSW Phone Number: 11/06/2020, 12:32 PM   Clinical Narrative:   Pt d/c today with Frances Furbish HHPT and RN. Rolling walker to be delivered to room by [facility].     Final next level of care: Home w Home Health Services Barriers to Discharge: Barriers Resolved   Patient Goals and CMS Choice Patient states their goals for this hospitalization and ongoing recovery are:: return home   Choice offered to / list presented to : Spouse  Discharge Placement                       Discharge Plan and Services In-house Referral: Clinical Social Work   Post Acute Care Choice: Home Health          DME Arranged: Dan Humphreys rolling DME Agency: [facility] Date DME Agency Contacted: 11/06/20 Time DME Agency Contacted: 1226 Representative spoke with at DME Agency: Velna Hatchet HH Arranged: PT,RN HH Agency: [facility] Date [facility] Agency Contacted: 11/06/20 Time HH Agency Contacted: 1226 Representative spoke with at [facility] Agency: Kandee Keen  Social Determinants of Health (SDOH) Interventions     Readmission Risk Interventions Readmission Risk Prevention Plan 11/06/2020 09/22/2019 08/02/2019  Post Dischage Appt - - Complete  Medication Screening - - Complete  Transportation Screening Complete Complete Complete  PCP or Specialist Appt within 3-5 Days - Complete -  HRI or Home Care Consult Complete Complete -  Social Work Consult for Recovery Care Planning/Counseling Complete Complete -  Palliative Care Screening Not Applicable Complete -  Medication Review Oceanographer) Complete Complete -  Some recent data might be hidden

## 2020-11-06 NOTE — Discharge Summary (Signed)
Physician Discharge Summary  [name] YDX:[id number] DOB: February 15, 1956 DOA: 11/03/2020  PCP: Perlie Mayo, NP  Admit date: 11/03/2020  Discharge date: 11/06/2020  Admitted From:Home  Disposition:  Home  Recommendations for Outpatient Follow-up:  1. Follow up with PCP in 1-2 weeks 2. Continue home medications as previously prescribed, enforced compliance and refills provided 3. Decrease levothyroxine dose to 25 mcg daily 4. Follow-up BMP to reassess sodium levels in 1 week  Home Health: Yes with home health PT/RN  Equipment/Devices: None  Discharge Condition:Stable  CODE STATUS: Full  Diet recommendation: Heart Healthy/Carb Modified  Brief/Interim Summary:  Stanley Levy a 65 y.o.malewith medical history significant forchronic combined systolic and diastolic CHF with nonischemic cardiomyopathy with LVEF 25%, LV mural thrombus, dyslipidemia, hypothyroidism, type 2 diabetes, and overall medication noncompliance who presented to the ED with altered mentation after having stopped all of his medications including his insulin 3 months prior. Patient was admitted with acute metabolic encephalopathy related to [facility] and was also noted to have some AKI.  He is now back to his usual baseline and is eating and drinking well with stable blood glucose levels noted.  He has been encouraged to remain compliant with his home medications and he agrees to do so.  Refills have been provided.  PT has assessed him and recommends home health physical therapy.  Wife states that she has been having a difficult time caring for him at home, but does not want him to go to any facility.  He is currently stable condition for discharge.   Discharge Diagnoses:  Active Problems:   Acute metabolic encephalopathy  Principal discharge diagnosis: Acute metabolic encephalopathy related to HHS secondary to medication noncompliance.  Associated AKI.  Discharge Instructions  Discharge Instructions    Diet -  low sodium heart healthy   Complete by: As directed    Increase activity slowly   Complete by: As directed      Allergies as of 11/06/2020   No Known Allergies     Medication List    STOP taking these medications   furosemide 40 MG tablet Commonly known as: LASIX     TAKE these medications   acetaminophen 325 MG tablet Commonly known as: TYLENOL Take 2 tablets (650 mg total) by mouth every 4 (four) hours as needed for headache or mild pain.   apixaban 5 MG Tabs tablet Commonly known as: ELIQUIS Take 1 tablet (5 mg total) by mouth 2 (two) times daily.   blood glucose meter kit and supplies Dispense based on patient and insurance preference. Use up to four times daily as directed. (FOR ICD-10 E10.9, E11.9).   digoxin 0.125 MG tablet Commonly known as: LANOXIN Take 1 tablet (0.125 mg total) by mouth daily.   Entresto 24-26 MG Generic drug: sacubitril-valsartan Take 1 tablet by mouth 2 (two) times daily.   escitalopram 10 MG tablet Commonly known as: Lexapro Take 1 tablet (10 mg total) by mouth daily.   gabapentin 100 MG capsule Commonly known as: Neurontin Take 1 capsule (100 mg total) by mouth 3 (three) times daily.   Glucose Meter Test test strip Generic drug: glucose blood Use as instructed   imipramine 50 MG tablet Commonly known as: TOFRANIL Take 50 mg by mouth at bedtime.   Insulin Pen Needle 31G X 5 MM Misc 1 Units by Does not apply route 2 (two) times daily.   levothyroxine 25 MCG tablet Commonly known as: SYNTHROID Take 1 tablet (25 mcg total) by mouth daily  before breakfast. What changed:   medication strength  how much to take   metFORMIN 500 MG 24 hr tablet Commonly known as: Glucophage XR Take 1 tablet (500 mg total) by mouth daily with breakfast.   metoprolol succinate 25 MG 24 hr tablet Commonly known as: TOPROL-XL Take 1 tablet (25 mg total) by mouth daily.   NovoLOG Mix 70/30 FlexPen (70-30) 100 UNIT/ML FlexPen Generic drug:  insulin aspart protamine - aspart Inject 0.2 mLs (20 Units total) into the skin 2 (two) times daily with a meal.   OneTouch Delica Lancets 58P Misc Four times daily testing  dx e11.65   Lancets 30G Misc Check BG twice daily, once before breakfast and once before supper.   potassium chloride SA 20 MEQ tablet Commonly known as: KLOR-CON Take 2 tablets (40 mEq total) by mouth daily.   rosuvastatin 5 MG tablet Commonly known as: Crestor Take 1 tablet (5 mg total) by mouth every evening.   spironolactone 25 MG tablet Commonly known as: ALDACTONE Take 0.5 tablets (12.5 mg total) by mouth daily.   tamsulosin 0.4 MG Caps capsule Commonly known as: FLOMAX Take 1 capsule (0.4 mg total) by mouth daily after supper.   Vitamin D (Ergocalciferol) 1.25 MG (50000 UNIT) Caps capsule Commonly known as: DRISDOL Take 1 capsule (50,000 Units total) by mouth every 7 (seven) days.            Durable Medical Equipment  (From admission, onward)         Start     Ordered   11/06/20 0955  DME Gilford Rile  Once       Question Answer Comment  Walker: With [place] Wheels   Patient needs a walker to treat with the following condition Weakness      11/06/20 0956          Follow-up Information    Perlie Mayo, NP. Schedule an appointment as soon as possible for a visit in 1 week(s).   Specialty: Family Medicine Contact information: [address] [place] [address] [phone]              No Known Allergies  Consultations:  None   Procedures/Studies: CT Head Wo Contrast  Result Date: 11/03/2020 CLINICAL DATA:  65 year old male with delirium. Hyperglycemia, reportedly the patient decided to stop taking insulin. Altered mental status. EXAM: CT HEAD WITHOUT CONTRAST TECHNIQUE: Contiguous axial images were obtained from the base of the skull through the vertex without intravenous contrast. COMPARISON:  Brain MRI 08/02/2019.  Head CT 09/08/2019. FINDINGS: Brain: Cerebral volume  appears stable since last year, with mild ex vacuo enlargement of the left lateral ventricle related to some periventricular white matter encephalomalacia, including at the anterior external capsule. Superimposed conspicuous and fairly circumscribed new hyperdensity limited to the left basal ganglia on series 6, image 11. No regional edema or mass effect. Stable gray-white matter differentiation elsewhere. Patchy right hemisphere white matter hypodensity. Chronic small dystrophic calcification in the left parietal lobe. No convincing acute intracranial hemorrhage. No cortically based acute infarct identified. No midline shift, mass effect, or evidence of intracranial mass lesion. Normal basilar cisterns. Vascular: No suspicious intracranial vascular hyperdensity. Skull: Stable, negative. Sinuses/Orbits: Visualized paranasal sinuses and mastoids are stable and well aerated. Other: Visualized orbits and scalp soft tissues are within normal limits. IMPRESSION: 1. New circumscribed hyperdensity at the left basal ganglia since last year, with no regional edema or mass effect. 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 2. Otherwise stable CT appearance of chronic small vessel disease since last year. Electronically Signed   By: Genevie Ann M.D.   On: 11/03/2020 10:50   DG Chest Portable 1 View  Result Date: 11/03/2020 CLINICAL DATA:  Confusion. EXAM: PORTABLE CHEST 1 VIEW COMPARISON:  09/13/2019 and older exams. FINDINGS: Cardiac silhouette is normal in size. No mediastinal or hilar masses. Clear lungs. No pleural effusion or pneumothorax. Skeletal structures are grossly intact. IMPRESSION: No active disease. Electronically Signed   By: Lajean Manes M.D.   On: 11/03/2020 10:21     Discharge Exam: Vitals:   11/06/20 0005 11/06/20 0552  BP: 109/75 114/74  Pulse: 75 72  Resp: 18 18  Temp: 98 F (36.7  C) (!) 97.2 F (36.2 C)  SpO2: 99%    Vitals:   11/05/20 1729 11/05/20 2003 11/06/20 0005 11/06/20 0552  BP: 102/71 106/71 109/75 114/74  Pulse:  72 75 72  Resp: _0 Temp:  97.9 F (36.6 C) 98 F (36.7 C) (!) 97.2 F (36.2 C)  TempSrc:   Oral   SpO2:  100% 99%   Weight:      Height:        General: Pt is alert, awake, not in acute distress Cardiovascular: RRR, S1/S2 +, no rubs, no gallops Respiratory: CTA bilaterally, no wheezing, no rhonchi Abdominal: Soft, NT, ND, bowel sounds + Extremities: no edema, no cyanosis    The results of significant diagnostics from this hospitalization (including imaging, microbiology, ancillary and laboratory) are listed below for reference.     Microbiology: Recent Results (from the past 240 hour(s))  Resp Panel by RT-PCR (Flu A&B, Covid) Nasopharyngeal Swab     Status: None   Collection Time: 11/03/20 12:55 PM   Specimen: Nasopharyngeal Swab; Nasopharyngeal(NP) swabs in vial transport medium  Result Value Ref Range Status   SARS Coronavirus 2 by RT PCR NEGATIVE NEGATIVE Final    Comment: (NOTE) SARS-CoV-2 target nucleic acids are NOT DETECTED.  The SARS-CoV-2 RNA is generally detectable in upper respiratory specimens during the acute phase of infection. The lowest concentration of SARS-CoV-2 viral copies this assay can detect is 138 copies/mL. A negative result does not preclude SARS-Cov-2 infection and should not be used as the sole basis for treatment or other patient management decisions. A negative result may occur with  improper specimen collection/handling, submission of specimen other than nasopharyngeal swab, presence of viral mutation(s) within the areas targeted by this assay, and inadequate number of viral copies(<138 copies/mL). A negative result must be combined with clinical observations, patient history, and epidemiological information. The expected result is Negative.  Fact Sheet for Patients:   EntrepreneurPulse.com.au  Fact Sheet for Healthcare Providers:  IncredibleEmployment.be  This test is no t yet approved or cleared by the [place] FDA and  has been authorized for detection and/or diagnosis of SARS-CoV-2 by FDA under an Emergency Use Authorization (EUA). This EUA will remain  in effect (meaning this test can be used) for the duration of the COVID-19 declaration under Section 564(b)(1) of the Act, 21 U.S.C.section 360bbb-3(b)(1), unless the authorization is terminated  or revoked sooner.       Influenza A by PCR NEGATIVE NEGATIVE Final   Influenza B by PCR NEGATIVE NEGATIVE Final    Comment: (NOTE) The Xpert Xpress SARS-CoV-2/FLU/RSV plus assay is intended as an aid in the diagnosis of influenza from Nasopharyngeal swab specimens and should not be  used as a sole basis for treatment. Nasal washings and aspirates are unacceptable for Xpert Xpress SARS-CoV-2/FLU/RSV testing.  Fact Sheet for Patients: EntrepreneurPulse.com.au  Fact Sheet for Healthcare Providers: IncredibleEmployment.be  This test is not yet approved or cleared by the [place] FDA and has been authorized for detection and/or diagnosis of SARS-CoV-2 by FDA under an Emergency Use Authorization (EUA). This EUA will remain in effect (meaning this test can be used) for the duration of the COVID-19 declaration under Section 564(b)(1) of the Act, 21 U.S.C. section 360bbb-3(b)(1), unless the authorization is terminated or revoked.  Performed at [facility], [address] [place], [place] [address]   MRSA PCR Screening     Status: None   Collection Time: 11/03/20  1:39 PM   Specimen: Nasal Mucosa; Nasopharyngeal  Result Value Ref Range Status   MRSA by PCR NEGATIVE NEGATIVE Final    Comment:        The GeneXpert MRSA Assay (FDA approved for NASAL specimens only), is one component of a comprehensive MRSA  colonization surveillance program. It is not intended to diagnose MRSA infection nor to guide or monitor treatment for MRSA infections. Performed at [facility], [address], [place], [place] [address]   Culture, blood (Routine X 2) w Reflex to ID Panel     Status: None (Preliminary result)   Collection Time: 11/05/20 12:20 AM   Specimen: BLOOD LEFT HAND  Result Value Ref Range Status   Specimen Description BLOOD LEFT HAND  Final   Special Requests   Final    BOTTLES DRAWN AEROBIC AND ANAEROBIC Blood Culture adequate volume   Culture   Final    NO GROWTH 1 DAY Performed at [facility], [address], [place], [place] [address]    Report Status PENDING  Incomplete  Culture, blood (Routine X 2) w Reflex to ID Panel     Status: None (Preliminary result)   Collection Time: 11/05/20 12:20 AM   Specimen: BLOOD RIGHT HAND  Result Value Ref Range Status   Specimen Description BLOOD RIGHT HAND  Final   Special Requests   Final    BOTTLES DRAWN AEROBIC AND ANAEROBIC Blood Culture adequate volume   Culture   Final    NO GROWTH 1 DAY Performed at [facility], [address], [place], [place] [address]    Report Status PENDING  Incomplete     Labs: BNP (last 3 results) No results for input(s): BNP in the last 8760 hours. Basic Metabolic Panel: Recent Labs  Lab 11/03/20 0941 11/03/20 1558 11/03/20 2012 11/04/20 0455 11/04/20 2151 11/05/20 0356 11/05/20 1557 11/06/20 0438  NA  --    < > 156* 160*  --  157* 142 153*  K  --    < > 3.3* 3.1*  --  3.6 3.5 4.0  CL  --    < > 117* 121*  --  124* 108 118*  CO2  --    < > 26 27  --  _0 GLUCOSE  --    < > 109* 172* 248* 230* 399* 105*  BUN  --    < > 34* 34*  --  42* 48* 37*  CREATININE  --    < > 1.32* 1.36*  --  1.75* 1.74* 1.30*  CALCIUM  --    < > 9.1 9.3  --  8.3* 7.7* 8.2*  MG 2.8*  --   --  2.6*  --  2.3  --  2.0  PHOS  --   --   --  1.6*  --   --   --   --    < > = values in this interval not displayed.   Liver  Function Tests: Recent Labs  Lab 11/04/20 0455  AST 25  ALT 14  ALKPHOS 55  BILITOT 1.1  PROT 5.8*  ALBUMIN 3.0*   No results for input(s): LIPASE, AMYLASE in the last 168 hours. Recent Labs  Lab 11/03/20 1205  AMMONIA <9*   CBC: Recent Labs  Lab 11/03/20 0937 11/04/20 0455 11/05/20 0356 11/06/20 0438  WBC 12.6* 13.9* 6.8 7.7  NEUTROABS 9.4*  --   --   --   HGB 16.3 14.4 12.1* 13.0  HCT 51.5 45.3 40.0 42.4  MCV 99.6 99.1 102.3* 101.4*  PLT 245 216 136* 135*   Cardiac Enzymes: No results for input(s): CKTOTAL, CKMB, CKMBINDEX, TROPONINI in the last 168 hours. BNP: Invalid input(s): POCBNP CBG: Recent Labs  Lab 11/06/20 0002 11/06/20 0536 11/06/20 0549 11/06/20 0717 11/06/20 1130  GLUCAP 155* 113* 104* 84 257*   D-Dimer No results for input(s): DDIMER in the last 72 hours. Hgb A1c Recent Labs    11/03/20 1205  HGBA1C >15.5*   Lipid Profile No results for input(s): CHOL, HDL, LDLCALC, TRIG, CHOLHDL, LDLDIRECT in the last 72 hours. Thyroid function studies Recent Labs    11/03/20 1205  TSH 6.016*   Anemia work up No results for input(s): VITAMINB12, FOLATE, FERRITIN, TIBC, IRON, RETICCTPCT in the last 72 hours. Urinalysis    Component Value Date/Time   COLORURINE STRAW (A) 11/03/2020 1048   APPEARANCEUR CLEAR 11/03/2020 1048   LABSPEC 1.025 11/03/2020 1048   PHURINE 6.0 11/03/2020 1048   GLUCOSEU >=500 (A) 11/03/2020 1048   HGBUR NEGATIVE 11/03/2020 1048   BILIRUBINUR NEGATIVE 11/03/2020 1048   KETONESUR 20 (A) 11/03/2020 1048   PROTEINUR NEGATIVE 11/03/2020 1048   NITRITE NEGATIVE 11/03/2020 1048   LEUKOCYTESUR NEGATIVE 11/03/2020 1048   Sepsis Labs Invalid input(s): PROCALCITONIN,  WBC,  LACTICIDVEN Microbiology Recent Results (from the past 240 hour(s))  Resp Panel by RT-PCR (Flu A&B, Covid) Nasopharyngeal Swab     Status: None   Collection Time: 11/03/20 12:55 PM   Specimen: Nasopharyngeal Swab; Nasopharyngeal(NP) swabs in vial  transport medium  Result Value Ref Range Status   SARS Coronavirus 2 by RT PCR NEGATIVE NEGATIVE Final    Comment: (NOTE) SARS-CoV-2 target nucleic acids are NOT DETECTED.  The SARS-CoV-2 RNA is generally detectable in upper respiratory specimens during the acute phase of infection. The lowest concentration of SARS-CoV-2 viral copies this assay can detect is 138 copies/mL. A negative result does not preclude SARS-Cov-2 infection and should not be used as the sole basis for treatment or other patient management decisions. A negative result may occur with  improper specimen collection/handling, submission of specimen other than nasopharyngeal swab, presence of viral mutation(s) within the areas targeted by this assay, and inadequate number of viral copies(<138 copies/mL). A negative result must be combined with clinical observations, patient history, and epidemiological information. The expected result is Negative.  Fact Sheet for Patients:  EntrepreneurPulse.com.au  Fact Sheet for Healthcare Providers:  IncredibleEmployment.be  This test is no t yet approved or cleared by the [place] FDA and  has been authorized for detection and/or diagnosis of SARS-CoV-2 by FDA under an Emergency Use Authorization (EUA). This EUA will remain  in effect (meaning this test can be used) for the duration of the  COVID-19 declaration under Section 564(b)(1) of the Act, 21 U.S.C.section 360bbb-3(b)(1), unless the authorization is terminated  or revoked sooner.       Influenza A by PCR NEGATIVE NEGATIVE Final   Influenza B by PCR NEGATIVE NEGATIVE Final    Comment: (NOTE) The Xpert Xpress SARS-CoV-2/FLU/RSV plus assay is intended as an aid in the diagnosis of influenza from Nasopharyngeal swab specimens and should not be used as a sole basis for treatment. Nasal washings and aspirates are unacceptable for Xpert Xpress SARS-CoV-2/FLU/RSV testing.  Fact  Sheet for Patients: EntrepreneurPulse.com.au  Fact Sheet for Healthcare Providers: IncredibleEmployment.be  This test is not yet approved or cleared by the [place] FDA and has been authorized for detection and/or diagnosis of SARS-CoV-2 by FDA under an Emergency Use Authorization (EUA). This EUA will remain in effect (meaning this test can be used) for the duration of the COVID-19 declaration under Section 564(b)(1) of the Act, 21 U.S.C. section 360bbb-3(b)(1), unless the authorization is terminated or revoked.  Performed at [facility], [address], [place], [place] [address]   MRSA PCR Screening     Status: None   Collection Time: 11/03/20  1:39 PM   Specimen: Nasal Mucosa; Nasopharyngeal  Result Value Ref Range Status   MRSA by PCR NEGATIVE NEGATIVE Final    Comment:        The GeneXpert MRSA Assay (FDA approved for NASAL specimens only), is one component of a comprehensive MRSA colonization surveillance program. It is not intended to diagnose MRSA infection nor to guide or monitor treatment for MRSA infections. Performed at [facility], [address] [place], [place] [address]   Culture, blood (Routine X 2) w Reflex to ID Panel     Status: None (Preliminary result)   Collection Time: 11/05/20 12:20 AM   Specimen: BLOOD LEFT HAND  Result Value Ref Range Status   Specimen Description BLOOD LEFT HAND  Final   Special Requests   Final    BOTTLES DRAWN AEROBIC AND ANAEROBIC Blood Culture adequate volume   Culture   Final    NO GROWTH 1 DAY Performed at [facility], [address] [place], [place] [address]    Report Status PENDING  Incomplete  Culture, blood (Routine X 2) w Reflex to ID Panel     Status: None (Preliminary result)   Collection Time: 11/05/20 12:20 AM   Specimen: BLOOD RIGHT HAND  Result Value Ref Range Status   Specimen Description BLOOD RIGHT HAND  Final   Special Requests   Final    BOTTLES DRAWN  AEROBIC AND ANAEROBIC Blood Culture adequate volume   Culture   Final    NO GROWTH 1 DAY Performed at [facility], [address] [place], [place] [address]    Report Status PENDING  Incomplete     Time coordinating discharge: 35 minutes  SIGNED:   Rodena Goldmann, DO [facility] 11/06/2020, 11:46 AM  If 7PM-7AM, please contact night-coverage [email or web address]

## 2020-11-06 NOTE — Care Management Important Message (Signed)
Important Message  Patient Details  Name: KIMONI PAGLIARULO MRN: [id number] Date of Birth: October 10, 1955   Medicare Important Message Given:  Yes     Corey Harold 11/06/2020, 12:25 PM

## 2020-11-06 NOTE — Progress Notes (Addendum)
Inpatient Diabetes Program Recommendations  AACE/ADA: New Consensus Statement on Inpatient Glycemic Control (2015)  Target Ranges:  Prepandial:   less than 140 mg/dL      Peak postprandial:   less than 180 mg/dL (1-2 hours)      Critically ill patients:  140 - 180 mg/dL   Lab Results  Component Value Date   GLUCAP 84 11/06/2020   HGBA1C 12.9 (A) 01/12/2020   HGBA1C 12.9 01/12/2020   HGBA1C 12.9 (A) 01/12/2020   HGBA1C 12.9 (A) 01/12/2020    Review of Glycemic Control  Diabetes history:DM2 Outpatient Diabetes medications:70/30 20 units BID, Metformin 500 mg QAM (not taken any meds in 3 months per H&P) Current orders for Inpatient glycemic control:Levemir 10 units qd + Novolog 0-9 units q 4 hrs.  Inpatient Diabetes Program Recommendations:   -Decrease Novolog correction to 0-9 units tid + hs 0-5 units. Spoke with Dr. Sherryll Burger and patient plans to discharge today.  Thank you, Billy Fischer. Goldia Ligman, RN, MSN, CDE  Diabetes Coordinator Inpatient Glycemic Control Team Team Pager [phone] (8am-5pm) 11/06/2020 10:01 AM

## 2020-11-06 NOTE — Progress Notes (Signed)
Physical Therapy Treatment Patient Details Name: Stanley Levy MRN: [id number] DOB: 12/12/1955 Today's Date: 11/06/2020    History of Present Illness Stanley Levy is a 65 y.o. male with medical history significant for chronic combined systolic and diastolic CHF with nonischemic cardiomyopathy with LVEF 25%, LV mural thrombus, dyslipidemia, hypothyroidism, type 2 diabetes, and overall medication noncompliance who presented to the ED today with altered mentation after having stopped all of his medications including his insulin 3 months prior.    PT Comments     Pt lying in bed and agreeable to therapy today.  Pt able to complete transfer to sitting and keep self steady.  Initial standing somewhat uncoordinated but able to complete and keep balance.  Pt able to increase gait distance today using walker.  Pt continues to be a little unsteady without assistive device with impulsivities, cues for larger stride and staying within walker BOS.  Pt returned to chair with chair alarm set, nursing in room.            Precautions / Restrictions Precautions Precautions: Fall Precaution Comments: patient reports no falls in the last six months    Mobility  Bed Mobility Overal bed mobility: Modified Independent Bed Mobility: Supine to Sit     Supine to sit: Modified independent (Device/Increase time);Supervision     General bed mobility comments: exhibits impulsive movements; supervision for safety    Transfers Overall transfer level: Needs assistance Equipment used: Rolling walker (2 wheeled);None Transfers: Sit to/from [employer] to Stand: Min guard Stand pivot transfers: Min guard       General transfer comment: unsteady without assistive device with impulsivities, cues for larger stride and staying within walker BOS  Ambulation/Gait Ambulation/Gait assistance: Min guard Gait Distance (Feet): 350 Feet Assistive device: Rolling walker (2 wheeled) Gait  Pattern/deviations: Step-through pattern;Decreased step length - right;Decreased step length - left;Drifts right/left;Wide base of support;Trunk flexed     General Gait Details: without assistive device - quick steps, impulsive movements, decreased ability to ambulate in straight path when turning corners and head movements side to side; this is improved with use of RW but still with shorter steps        Cognition Arousal/Alertness: Awake/alert Behavior During Therapy: WFL for tasks assessed/performed Overall Cognitive Status: Within Functional Limits for tasks assessed                                               Pertinent Vitals/Pain Pain Assessment: No/denies pain           PT Goals (current goals can now be found in the care plan section) Progress towards PT goals: Progressing toward goals    Frequency    Min 3X/week       AM-PAC PT "6 Clicks" Mobility   Outcome Measure  Help needed turning from your back to your side while in a flat bed without using bedrails?: None Help needed moving from lying on your back to sitting on the side of a flat bed without using bedrails?: None Help needed moving to and from a bed to a chair (including a wheelchair)?: A Little Help needed standing up from a chair using your arms (e.g., wheelchair or bedside chair)?: A Little Help needed to walk in hospital room?: A Little Help needed climbing 3-5 steps with a railing? : A Little 6 Click Score: 20  End of Session Equipment Utilized During Treatment: Gait belt Activity Tolerance: Patient tolerated treatment well Patient left: in chair;with nursing/sitter in room Nurse Communication: Mobility status PT Visit Diagnosis: Unsteadiness on feet (R26.81);Other abnormalities of gait and mobility (R26.89);Difficulty in walking, not elsewhere classified (R26.2)     Time: 0630-1601 PT Time Calculation (min) (ACUTE ONLY): 14 min  Charges:  $Gait Training: 8-22 mins                      Stanley Levy, PTA/CLT [phone]    Bascom Levels, Amberlin Utke B 11/06/2020, 1:28 PM

## 2020-11-07 ENCOUNTER — Telehealth: Payer: Self-pay | Admitting: Family Medicine

## 2020-11-07 LAB — HEMOGLOBIN A1C
Hgb A1c MFr Bld: >15.5 % — ABNORMAL HIGH (ref 4.8–5.6)
Mean Plasma Glucose: >398 mg/dL

## 2020-11-07 NOTE — Telephone Encounter (Signed)
Transition Care Management Unsuccessful Follow-up Telephone Call  Date of discharge and from where:  11/06/2020 from [facility]   Attempts:  1st Attempt  Reason for unsuccessful TCM follow-up call:  Unable to leave message

## 2020-11-08 ENCOUNTER — Telehealth: Payer: Self-pay

## 2020-11-08 NOTE — Telephone Encounter (Signed)
TOC call made - scheduled

## 2020-11-10 LAB — CULTURE, BLOOD (ROUTINE X 2)
Culture: NO GROWTH
Culture: NO GROWTH
Special Requests: ADEQUATE
Special Requests: ADEQUATE

## 2020-11-14 ENCOUNTER — Encounter: Payer: Medicare HMO | Admitting: Nurse Practitioner

## 2020-11-24 ENCOUNTER — Other Ambulatory Visit (HOSPITAL_COMMUNITY): Payer: Self-pay | Admitting: Psychiatry

## 2020-11-24 DIAGNOSIS — F411 Generalized anxiety disorder: Secondary | ICD-10-CM

## 2020-11-29 ENCOUNTER — Encounter: Payer: Self-pay | Admitting: Nurse Practitioner

## 2020-11-29 ENCOUNTER — Other Ambulatory Visit: Payer: Self-pay

## 2020-11-29 ENCOUNTER — Ambulatory Visit (INDEPENDENT_AMBULATORY_CARE_PROVIDER_SITE_OTHER): Payer: Medicare HMO | Admitting: Nurse Practitioner

## 2020-11-29 VITALS — BP 121/81 | HR 86 | Temp 97.0°F | Ht 68.0 in | Wt 191.0 lb

## 2020-11-29 DIAGNOSIS — I5020 Unspecified systolic (congestive) heart failure: Secondary | ICD-10-CM

## 2020-11-29 DIAGNOSIS — Z139 Encounter for screening, unspecified: Secondary | ICD-10-CM | POA: Diagnosis not present

## 2020-11-29 DIAGNOSIS — E039 Hypothyroidism, unspecified: Secondary | ICD-10-CM

## 2020-11-29 DIAGNOSIS — E870 Hyperosmolality and hypernatremia: Secondary | ICD-10-CM

## 2020-11-29 DIAGNOSIS — E1165 Type 2 diabetes mellitus with hyperglycemia: Secondary | ICD-10-CM

## 2020-11-29 NOTE — Assessment & Plan Note (Addendum)
-  at hospital levothyroxine was decreased to 25 mg per day -this is managed by Dr. Fransico Him as well; recommend he get an appointment with them in the next 1-3 weeks for monitoring -last appt with them was 05/28/20

## 2020-11-29 NOTE — Assessment & Plan Note (Signed)
-  followed by cardiology, and was recently started on entresto

## 2020-11-29 NOTE — Assessment & Plan Note (Signed)
Lab Results  Component Value Date   HGBA1C >15.5 (H) 11/05/2020  -he is followed by Dr. Fransico Him for DM management

## 2020-11-29 NOTE — Patient Instructions (Signed)
Please set up an appointment as soon as possible with Dr. Fransico Him. I read Hannah's note, and she referred you to Dr. Fransico Him for both your thyroid issues and diabetes. Since your levothyroxine was recently changed, Dr. Fransico Him will likely want to draw labs to monitor your thyroid function in the next 1-3 weeks.

## 2020-11-29 NOTE — Progress Notes (Signed)
Established Patient Office Visit  Subjective:  Patient ID: Stanley Levy, male    DOB: May 02, 1956  Age: 65 y.o. MRN: [id number]  CC:  Chief Complaint  Patient presents with  . Annual Exam    CPE    HPI Stanley Levy presents for physical exam.  He was discharged from [facility] on [facility] for metabolic encephalopathy related to poor glycemic control and subsequent hyperosmolar hyperglycemic state, and he had hypernatremia at the time of discharge.  He states he was recently started on Entresto for his heart failure.  No colonoscopy on record, and he doesn't remember who performed his colonoscopy. He states it was done at age 64, and he is unsure if he has had a subsequent colonoscopy.  Past Medical History:  Diagnosis Date  . Anxiety   . Chronic combined systolic and diastolic CHF (congestive heart failure) (HCC)    a. EF 20% with global HK by echo in 08/2019. Also noted to have LV mural thrombus.   . Diabetes mellitus, type II ([place])   . Foot ulcer ([place])    a. normal ABIs in 2021.  Marland Kitchen History of noncompliance with medical treatment   . Hyperlipidemia   . Hypothyroidism   . LV (left ventricular) mural thrombus   . NICM (nonischemic cardiomyopathy) ([place])   . Nonproliferative diabetic retinopathy ([place])     Past Surgical History:  Procedure Laterality Date  . FOOT SURGERY    . RIGHT/LEFT HEART CATH AND CORONARY ANGIOGRAPHY N/A 09/20/2019   Procedure: RIGHT/LEFT HEART CATH AND CORONARY ANGIOGRAPHY;  Surgeon: Leonie Man, MD;  Location: [facility];  Service: Cardiovascular;  Laterality: N/A;    Family History  Problem Relation Age of Onset  . Heart attack Father        Near Age 69  . Anxiety disorder Neg Hx   . Dementia Neg Hx   . Alcohol abuse Neg Hx   . Drug abuse Neg Hx   . ADD / ADHD Neg Hx   . Bipolar disorder Neg Hx   . Depression Neg Hx   . OCD Neg Hx   . Paranoid behavior Neg Hx   . Physical abuse Neg Hx   . Schizophrenia Neg Hx   . Seizures Neg Hx    . Sexual abuse Neg Hx     Social History   Socioeconomic History  . Marital status: Married    Spouse name: Khayree Delellis   . Number of children: Not on file  . Years of education: Not on file  . Highest education level: High school graduate  Occupational History  . Not on file  Tobacco Use  . Smoking status: Never Smoker  . Smokeless tobacco: Never Used  Vaping Use  . Vaping Use: Never used  Substance and Sexual Activity  . Alcohol use: No  . Drug use: No  . Sexual activity: Yes  Other Topics Concern  . Not on file  Social History Narrative   Recent left working at [employer] after getting sick in start of February      Lives with wife [name]- married 40 (he is her caregiver, she has had strokes)    No children      Beagle: Lorre Nick       Enjoys: weight lift, yard work, Oceanographer, read-       Diet: increasing veggies, was drinking chocolate milk, root beer, and sweet tea, fast food   Caffeine: sweet tea-3 cups of more daily  Water: 8 cups daily       Wears seat belt    Smoke detectors    Does not use phone while driving    Social Determinants of Health   Financial Resource Strain: Not on file  Food Insecurity: Not on file  Transportation Needs: Not on file  Physical Activity: Not on file  Stress: Not on file  Social Connections: Not on file  Intimate Partner Violence: Not on file    Outpatient Medications Prior to Visit  Medication Sig Dispense Refill  . acetaminophen (TYLENOL) 325 MG tablet Take 2 tablets (650 mg total) by mouth every 4 (four) hours as needed for headache or mild pain. 12 tablet 0  . apixaban (ELIQUIS) 5 MG TABS tablet Take 1 tablet (5 mg total) by mouth 2 (two) times daily. 60 tablet 3  . blood glucose meter kit and supplies Dispense based on patient and insurance preference. Use up to four times daily as directed. (FOR ICD-10 E10.9, E11.9). 1 each 0  . digoxin (LANOXIN) 0.125 MG tablet Take 1 tablet (0.125 mg total) by mouth daily. 30 tablet 3  .  escitalopram (LEXAPRO) 10 MG tablet Take 1 tablet (10 mg total) by mouth daily. 30 tablet 2  . gabapentin (NEURONTIN) 100 MG capsule Take 1 capsule (100 mg total) by mouth 3 (three) times daily. 90 capsule 2  . glucose blood (GLUCOSE METER TEST) test strip Use as instructed 400 each 3  . imipramine (TOFRANIL) 50 MG tablet TAKE 1 TABLET BY MOUTH EVERYDAY AT BEDTIME 90 tablet 3  . insulin aspart protamine - aspart (NOVOLOG MIX 70/30 FLEXPEN) (70-30) 100 UNIT/ML FlexPen Inject 0.2 mLs (20 Units total) into the skin 2 (two) times daily with a meal. 3 mL 3  . Insulin Pen Needle 31G X 5 MM MISC 1 Units by Does not apply route 2 (two) times daily. 100 each 5  . Lancets 30G MISC Check BG twice daily, once before breakfast and once before supper. 50 each 11  . levothyroxine (SYNTHROID) 25 MCG tablet Take 1 tablet (25 mcg total) by mouth daily before breakfast. 30 tablet 2  . metFORMIN (GLUCOPHAGE XR) 500 MG 24 hr tablet Take 1 tablet (500 mg total) by mouth daily with breakfast. 30 tablet 3  . metoprolol succinate (TOPROL-XL) 25 MG 24 hr tablet Take 1 tablet (25 mg total) by mouth daily. 90 tablet 3  . OneTouch Delica Lancets 11D MISC Four times daily testing  dx e11.65 150 each 5  . potassium chloride SA (KLOR-CON) 20 MEQ tablet Take 2 tablets (40 mEq total) by mouth daily. 90 tablet 3  . rosuvastatin (CRESTOR) 5 MG tablet Take 1 tablet (5 mg total) by mouth every evening. 30 tablet 3  . sacubitril-valsartan (ENTRESTO) 24-26 MG Take 1 tablet by mouth 2 (two) times daily. 60 tablet 6  . spironolactone (ALDACTONE) 25 MG tablet Take 0.5 tablets (12.5 mg total) by mouth daily. 15 tablet 3  . tamsulosin (FLOMAX) 0.4 MG CAPS capsule Take 1 capsule (0.4 mg total) by mouth daily after supper. 30 capsule 3  . Vitamin D, Ergocalciferol, (DRISDOL) 1.25 MG (50000 UNIT) CAPS capsule Take 1 capsule (50,000 Units total) by mouth every 7 (seven) days. 5 capsule 2   No facility-administered medications prior to visit.     No Known Allergies  ROS Review of Systems  Constitutional: Negative.   HENT: Negative.   Eyes: Negative.   Respiratory: Negative.   Cardiovascular: Negative.   Gastrointestinal: Negative.  Endocrine: Negative.   Genitourinary: Negative.   Musculoskeletal: Negative.   Skin: Negative.   Allergic/Immunologic: Negative.   Neurological: Negative.   Hematological: Negative.   Psychiatric/Behavioral: Negative.       Objective:    Physical Exam Constitutional:      Appearance: Normal appearance.  HENT:     Head: Normocephalic and atraumatic.     Right Ear: Tympanic membrane, ear canal and external ear normal.     Left Ear: Tympanic membrane, ear canal and external ear normal.     Nose: Nose normal.     Mouth/Throat:     Mouth: Mucous membranes are moist.     Pharynx: Oropharynx is clear.     Comments: Poor dentition Eyes:     Extraocular Movements: Extraocular movements intact.     Conjunctiva/sclera: Conjunctivae normal.     Pupils: Pupils are equal, round, and reactive to light.  Cardiovascular:     Rate and Rhythm: Normal rate and regular rhythm.     Pulses: Normal pulses.     Heart sounds: Normal heart sounds.  Pulmonary:     Effort: Pulmonary effort is normal.     Breath sounds: Normal breath sounds.  Abdominal:     General: Abdomen is flat. Bowel sounds are normal.     Palpations: Abdomen is soft.  Musculoskeletal:        General: Normal range of motion.     Cervical back: Normal range of motion and neck supple.  Skin:    General: Skin is warm and dry.     Capillary Refill: Capillary refill takes less than 2 seconds.     Coloration: Skin is pale.  Neurological:     General: No focal deficit present.     Mental Status: He is alert and oriented to person, place, and time.     Cranial Nerves: No cranial nerve deficit.     Sensory: No sensory deficit.     Motor: No weakness.     Coordination: Coordination normal.     Gait: Gait normal.  Psychiatric:         Mood and Affect: Mood normal.        Behavior: Behavior normal.        Thought Content: Thought content normal.        Judgment: Judgment normal.     BP 121/81 (BP Location: Right Arm, Patient Position: Sitting, Cuff Size: Large)   Pulse 86   Temp (!) 97 F (36.1 C) (Temporal)   Ht _0  (1.727 m)   Wt 191 lb (86.6 kg)   SpO2 100%   BMI 29.04 kg/m  Wt Readings from Last 3 Encounters:  11/29/20 191 lb (86.6 kg)  11/03/20 175 lb 0.7 oz (79.4 kg)  09/12/20 195 lb (88.5 kg)     Health Maintenance Due  Topic Date Due  . Hepatitis C Screening  Never done  . COLONOSCOPY (Pts 45-24yr Insurance coverage will need to be confirmed)  Never done    There are no preventive care reminders to display for this patient.  Lab Results  Component Value Date   TSH 6.016 (H) 11/03/2020   Lab Results  Component Value Date   WBC 7.7 11/06/2020   HGB 13.0 11/06/2020   HCT 42.4 11/06/2020   MCV 101.4 (H) 11/06/2020   PLT 135 (L) 11/06/2020   Lab Results  Component Value Date   NA 153 (H) 11/06/2020   K 4.0 11/06/2020   CO2 25 11/06/2020  GLUCOSE 105 (H) 11/06/2020   BUN 37 (H) 11/06/2020   CREATININE 1.30 (H) 11/06/2020   BILITOT 1.1 11/04/2020   ALKPHOS 55 11/04/2020   AST 25 11/04/2020   ALT 14 11/04/2020   PROT 5.8 (L) 11/04/2020   ALBUMIN 3.0 (L) 11/04/2020   CALCIUM 8.2 (L) 11/06/2020   ANIONGAP 10 11/06/2020   Lab Results  Component Value Date   CHOL 168 08/11/2019   Lab Results  Component Value Date   HDL 56 08/11/2019   Lab Results  Component Value Date   LDLCALC 84 08/11/2019   Lab Results  Component Value Date   TRIG 185 (H) 08/11/2019   Lab Results  Component Value Date   CHOLHDL 3.0 08/11/2019   Lab Results  Component Value Date   HGBA1C >15.5 (H) 11/05/2020      Assessment & Plan:   Problem List Items Addressed This Visit      Cardiovascular and Mediastinum   CHF (congestive heart failure) ([place])    -followed by cardiology, and was  recently started on entresto      Relevant Orders   CBC with Differential/Platelet   CMP14+EGFR     Endocrine   Uncontrolled type 2 diabetes mellitus with hyperglycemia ([place])    Lab Results  Component Value Date   HGBA1C >15.5 (H) 11/05/2020  -he is followed by Dr. Dorris Fetch for DM management        Relevant Orders   Lipid Panel With LDL/HDL Ratio   Hypothyroidism    -at hospital levothyroxine was decreased to 25 mg per day -this is managed by Dr. Dorris Fetch as well; recommend he get an appointment with them in the next 1-3 weeks for monitoring -last appt with them was 05/28/20        Other   Hypernatremia    -checking labs today      Relevant Orders   CBC with Differential/Platelet   CMP14+EGFR    Other Visit Diagnoses    Screening due    -  Primary   Relevant Orders   Cologuard   Hepatitis C antibody      No orders of the defined types were placed in this encounter.   Follow-up: Return in about 4 months (around 03/31/2021) for Lab follow-up ([facility]).    Noreene Larsson, NP

## 2020-11-29 NOTE — Assessment & Plan Note (Signed)
-  checking labs today 

## 2020-11-30 LAB — CBC WITH DIFFERENTIAL/PLATELET
Basophils Absolute: 0 10*3/uL (ref 0.0–0.2)
Basos: 0 %
EOS (ABSOLUTE): 0.1 10*3/uL (ref 0.0–0.4)
Eos: 2 %
Hematocrit: 37.1 % — ABNORMAL LOW (ref 37.5–51.0)
Hemoglobin: 12.5 g/dL — ABNORMAL LOW (ref 13.0–17.7)
Immature Grans (Abs): 0.1 10*3/uL (ref 0.0–0.1)
Immature Granulocytes: 1 %
Lymphocytes Absolute: 1.7 10*3/uL (ref 0.7–3.1)
Lymphs: 25 %
MCH: 31.6 pg (ref 26.6–33.0)
MCHC: 33.7 g/dL (ref 31.5–35.7)
MCV: 94 fL (ref 79–97)
Monocytes Absolute: 0.5 10*3/uL (ref 0.1–0.9)
Monocytes: 8 %
Neutrophils Absolute: 4.3 10*3/uL (ref 1.4–7.0)
Neutrophils: 64 %
Platelets: 219 10*3/uL (ref 150–450)
RBC: 3.95 x10E6/uL — ABNORMAL LOW (ref 4.14–5.80)
RDW: 13.1 % (ref 11.6–15.4)
WBC: 6.7 10*3/uL (ref 3.4–10.8)

## 2020-11-30 LAB — LIPID PANEL WITH LDL/HDL RATIO
Cholesterol, Total: 194 mg/dL (ref 100–199)
HDL: 52 mg/dL (ref >39)
LDL Chol Calc (NIH): 115 mg/dL — ABNORMAL HIGH (ref 0–99)
LDL/HDL Ratio: 2.2 ratio (ref 0.0–3.6)
Triglycerides: 156 mg/dL — ABNORMAL HIGH (ref 0–149)
VLDL Cholesterol Cal: 27 mg/dL (ref 5–40)

## 2020-11-30 LAB — CMP14+EGFR
ALT: 12 IU/L (ref 0–44)
AST: 12 IU/L (ref 0–40)
Albumin/Globulin Ratio: 2.2 (ref 1.2–2.2)
Albumin: 4.4 g/dL (ref 3.8–4.8)
Alkaline Phosphatase: 91 IU/L (ref 44–121)
BUN/Creatinine Ratio: 18 (ref 10–24)
BUN: 20 mg/dL (ref 8–27)
Bilirubin Total: 0.4 mg/dL (ref 0.0–1.2)
CO2: 22 mmol/L (ref 20–29)
Calcium: 9.3 mg/dL (ref 8.6–10.2)
Chloride: 98 mmol/L (ref 96–106)
Creatinine, Ser: 1.12 mg/dL (ref 0.76–1.27)
Globulin, Total: 2 g/dL (ref 1.5–4.5)
Glucose: 454 mg/dL — ABNORMAL HIGH (ref 65–99)
Potassium: 5.2 mmol/L (ref 3.5–5.2)
Sodium: 137 mmol/L (ref 134–144)
Total Protein: 6.4 g/dL (ref 6.0–8.5)
eGFR: 72 mL/min/{1.73_m2} (ref >59)

## 2020-11-30 LAB — HEPATITIS C ANTIBODY: Hep C Virus Ab: 0.2 s/co ratio (ref 0.0–0.9)

## 2020-11-30 NOTE — Progress Notes (Signed)
His blood sugar was elevated at 454. Has been using his insulin and metformin? He needs to get in with Dr. Fransico Him ASAP. What is his current blood sugar?

## 2020-12-03 ENCOUNTER — Telehealth: Payer: Self-pay

## 2020-12-03 ENCOUNTER — Other Ambulatory Visit: Payer: Self-pay

## 2020-12-03 DIAGNOSIS — E1165 Type 2 diabetes mellitus with hyperglycemia: Secondary | ICD-10-CM

## 2020-12-03 MED ORDER — NOVOLOG MIX 70/30 FLEXPEN (70-30) 100 UNIT/ML ~~LOC~~ SUPN: 20.0000 [IU] | PEN_INJECTOR | Freq: Two times a day (BID) | SUBCUTANEOUS | 0 refills | Status: DC

## 2020-12-03 MED ORDER — INSULIN PEN NEEDLE 31G X 5 MM MISC: 1.0000 [IU] | Freq: Two times a day (BID) | 0 refills | Status: DC

## 2020-12-03 NOTE — Telephone Encounter (Signed)
Patient need refill Patient still waiting on appt with Dr Fransico Him.  insulin aspart protamine - aspart (NOVOLOG MIX 70/30 FLEXPEN) (70-30) 100 UNIT/ML FlexPen   Insulin Pen Needle 31G X 5 MM MISC  (only has one left)  Pharmacy: [facility] [place]

## 2020-12-03 NOTE — Telephone Encounter (Signed)
Refill sent.

## 2020-12-11 ENCOUNTER — Other Ambulatory Visit: Payer: Self-pay

## 2020-12-11 ENCOUNTER — Ambulatory Visit (INDEPENDENT_AMBULATORY_CARE_PROVIDER_SITE_OTHER): Payer: Medicare HMO | Admitting: Nurse Practitioner

## 2020-12-11 ENCOUNTER — Encounter: Payer: Self-pay | Admitting: Nurse Practitioner

## 2020-12-11 VITALS — BP 117/64 | HR 80 | Temp 99.0°F | Resp 20 | Ht 68.0 in | Wt 198.0 lb

## 2020-12-11 DIAGNOSIS — E039 Hypothyroidism, unspecified: Secondary | ICD-10-CM

## 2020-12-11 DIAGNOSIS — Z0000 Encounter for general adult medical examination without abnormal findings: Secondary | ICD-10-CM

## 2020-12-11 DIAGNOSIS — E1165 Type 2 diabetes mellitus with hyperglycemia: Secondary | ICD-10-CM | POA: Diagnosis not present

## 2020-12-11 DIAGNOSIS — R351 Nocturia: Secondary | ICD-10-CM | POA: Insufficient documentation

## 2020-12-11 DIAGNOSIS — E782 Mixed hyperlipidemia: Secondary | ICD-10-CM | POA: Diagnosis not present

## 2020-12-11 DIAGNOSIS — D649 Anemia, unspecified: Secondary | ICD-10-CM

## 2020-12-11 MED ORDER — BLOOD GLUCOSE METER KIT: PACK | 0 refills | Status: AC

## 2020-12-11 MED ORDER — NOVOLOG MIX 70/30 FLEXPEN (70-30) 100 UNIT/ML ~~LOC~~ SUPN
24.0000 [IU] | PEN_INJECTOR | Freq: Two times a day (BID) | SUBCUTANEOUS | 0 refills | Status: DC
Start: 2020-12-11 — End: 2021-01-21

## 2020-12-11 MED ORDER — ROSUVASTATIN CALCIUM 20 MG PO TABS: 20.0000 mg | ORAL_TABLET | Freq: Every day | ORAL | 3 refills | Status: AC

## 2020-12-11 MED ORDER — MIRABEGRON ER 25 MG PO TB24: 25.0000 mg | ORAL_TABLET | Freq: Every day | ORAL | 1 refills | Status: AC

## 2020-12-11 NOTE — Patient Instructions (Signed)
Please set up an appointment with endocrinology for your diabetes.  For your next appointment with [place], please have fasting labs drawn 2-3 days prior to your appointment so we can discuss the results during your office visit.

## 2020-12-11 NOTE — Assessment & Plan Note (Signed)
-  kidney function is OK -he denies blood in stool, but I'll order hemoccults -will get iron panel with next set of labs, his sample is old a this point

## 2020-12-11 NOTE — Assessment & Plan Note (Signed)
Lab Results  Component Value Date   CHOL 194 11/29/2020   HDL 52 11/29/2020   LDLCALC 115 (H) 11/29/2020   TRIG 156 (H) 11/29/2020   CHOLHDL 3.0 08/11/2019  -goal LDL < 70 d/t DM -INCREASE rosuvastatin to 20 mg

## 2020-12-11 NOTE — Assessment & Plan Note (Signed)
-  will check thyroid with next set of labs

## 2020-12-11 NOTE — Progress Notes (Signed)
Established Patient Office Visit  Subjective:  Patient ID: Stanley Levy, male    DOB: [birth year]  Age: 65 y.o. MRN: [id number]  CC:  Chief Complaint  Patient presents with   Medicare Wellness    Welcome to Medicare visit.     HPI Stanley Levy presents for welcome to Medicare Visit.  At his last OV, he was referred to Endocrinology for uncontrolled DM.  There is no appointment set up at this point.  He states his home CBGs are running from 180s-240s.  He states that he has been having aproblem with nocturnal incontinence. Denies urinary hesitancy during the day.  Past Medical History:  Diagnosis Date   Anxiety    Chronic combined systolic and diastolic CHF (congestive heart failure) (HCC)    a. EF 20% with global HK by echo in 08/2019. Also noted to have LV mural thrombus.    Diabetes mellitus, type II ([place])    Foot ulcer ([place])    a. normal ABIs in 2021.   History of noncompliance with medical treatment    Hyperlipidemia    Hypothyroidism    LV (left ventricular) mural thrombus    NICM (nonischemic cardiomyopathy) (HCC)    Nonproliferative diabetic retinopathy ([place])     Past Surgical History:  Procedure Laterality Date   FOOT SURGERY     RIGHT/LEFT HEART CATH AND CORONARY ANGIOGRAPHY N/A 09/20/2019   Procedure: RIGHT/LEFT HEART CATH AND CORONARY ANGIOGRAPHY;  Surgeon: Leonie Man, MD;  Location: [facility];  Service: Cardiovascular;  Laterality: N/A;    Family History  Problem Relation Age of Onset   Heart attack Father        Near Age 51   Anxiety disorder Neg Hx    Dementia Neg Hx    Alcohol abuse Neg Hx    Drug abuse Neg Hx    ADD / ADHD Neg Hx    Bipolar disorder Neg Hx    Depression Neg Hx    OCD Neg Hx    Paranoid behavior Neg Hx    Physical abuse Neg Hx    Schizophrenia Neg Hx    Seizures Neg Hx    Sexual abuse Neg Hx     Social History   Socioeconomic History   Marital status: Married    Spouse name: Stanley Levy    Number of  children: Not on file   Years of education: Not on file   Highest education level: High school graduate  Occupational History   Not on file  Tobacco Use   Smoking status: Never   Smokeless tobacco: Never  Vaping Use   Vaping Use: Never used  Substance and Sexual Activity   Alcohol use: No   Drug use: No   Sexual activity: Yes  Other Topics Concern   Not on file  Social History Narrative   Recent left working at [employer] after getting sick in start of February      Lives with wife Stanley Levy- married 4 (he is her caregiver, she has had strokes)    No children      Beagle: Stanley Levy       Enjoys: weight lift, yard work, Oceanographer, read-       Diet: increasing veggies, was drinking chocolate milk, root beer, and sweet tea, fast food   Caffeine: sweet tea-3 cups of more daily    Water: 8 cups daily       Wears seat belt    Smoke detectors  Does not use phone while driving    Social Determinants of Health   Financial Resource Strain: Low Risk    Difficulty of Paying Living Expenses: Not hard at all  Food Insecurity: No Food Insecurity   Worried About Charity fundraiser in the Last Year: Never true   [place] in the Last Year: Never true  Transportation Needs: No Transportation Needs   Lack of Transportation (Medical): No   Lack of Transportation (Non-Medical): No  Physical Activity: Insufficiently Active   Days of Exercise per Week: 3 days   Minutes of Exercise per Session: 30 min  Stress: No Stress Concern Present   Feeling of Stress : Not at all  Social Connections: Moderately Isolated   Frequency of Communication with Friends and Family: Never   Frequency of Social Gatherings with Friends and Family: Once a week   Attends Religious Services: 1 to 4 times per year   Active Member of [employer] or Organizations: No   Attends Music therapist: Never   Marital Status: Married  Human resources officer Violence: Not At Risk   Fear of Current or Ex-Partner: No    Emotionally Abused: No   Physically Abused: No   Sexually Abused: No    Outpatient Medications Prior to Visit  Medication Sig Dispense Refill   acetaminophen (TYLENOL) 325 MG tablet Take 2 tablets (650 mg total) by mouth every 4 (four) hours as needed for headache or mild pain. 12 tablet 0   apixaban (ELIQUIS) 5 MG TABS tablet Take 1 tablet (5 mg total) by mouth 2 (two) times daily. 60 tablet 3   digoxin (LANOXIN) 0.125 MG tablet Take 1 tablet (0.125 mg total) by mouth daily. 30 tablet 3   escitalopram (LEXAPRO) 10 MG tablet Take 1 tablet (10 mg total) by mouth daily. 30 tablet 2   gabapentin (NEURONTIN) 100 MG capsule Take 1 capsule (100 mg total) by mouth 3 (three) times daily. 90 capsule 2   glucose blood (GLUCOSE METER TEST) test strip Use as instructed 400 each 3   imipramine (TOFRANIL) 50 MG tablet TAKE 1 TABLET BY MOUTH EVERYDAY AT BEDTIME 90 tablet 3   Insulin Pen Needle 31G X 5 MM MISC 1 Units by Does not apply route 2 (two) times daily. 100 each 0   Lancets 30G MISC Check BG twice daily, once before breakfast and once before supper. 50 each 11   metFORMIN (GLUCOPHAGE XR) 500 MG 24 hr tablet Take 1 tablet (500 mg total) by mouth daily with breakfast. 30 tablet 3   metoprolol succinate (TOPROL-XL) 25 MG 24 hr tablet Take 1 tablet (25 mg total) by mouth daily. 90 tablet 3   OneTouch Delica Lancets 62X MISC Four times daily testing  dx e11.65 150 each 5   potassium chloride SA (KLOR-CON) 20 MEQ tablet Take 2 tablets (40 mEq total) by mouth daily. 90 tablet 3   sacubitril-valsartan (ENTRESTO) 24-26 MG Take 1 tablet by mouth 2 (two) times daily. 60 tablet 6   spironolactone (ALDACTONE) 25 MG tablet Take 0.5 tablets (12.5 mg total) by mouth daily. 15 tablet 3   tamsulosin (FLOMAX) 0.4 MG CAPS capsule Take 1 capsule (0.4 mg total) by mouth daily after supper. 30 capsule 3   Vitamin D, Ergocalciferol, (DRISDOL) 1.25 MG (50000 UNIT) CAPS capsule Take 1 capsule (50,000 Units total) by mouth  every 7 (seven) days. 5 capsule 2   blood glucose meter kit and supplies Dispense based on patient and insurance  preference. Use up to four times daily as directed. (FOR ICD-10 E10.9, E11.9). 1 each 0   insulin aspart protamine - aspart (NOVOLOG MIX 70/30 FLEXPEN) (70-30) 100 UNIT/ML FlexPen Inject 0.2 mLs (20 Units total) into the skin 2 (two) times daily with a meal. 15 mL 0   rosuvastatin (CRESTOR) 5 MG tablet Take 1 tablet (5 mg total) by mouth every evening. 30 tablet 3   levothyroxine (SYNTHROID) 25 MCG tablet Take 1 tablet (25 mcg total) by mouth daily before breakfast. 30 tablet 2   No facility-administered medications prior to visit.    No Known Allergies  ROS Review of Systems    Objective:    Physical Exam  BP 117/64 (BP Location: Right Arm, Patient Position: Sitting, Cuff Size: Large)   Pulse 80   Temp 99 F (37.2 C) (Oral)   Resp 20   Ht _0  (1.727 m)   Wt 198 lb (89.8 kg)   SpO2 97%   BMI 30.11 kg/m  Wt Readings from Last 3 Encounters:  12/11/20 198 lb (89.8 kg)  11/29/20 191 lb (86.6 kg)  11/03/20 175 lb 0.7 oz (79.4 kg)     Health Maintenance Due  Topic Date Due   COLONOSCOPY (Pts 45-70yr Insurance coverage will need to be confirmed)  Never done    There are no preventive care reminders to display for this patient.  Lab Results  Component Value Date   TSH 6.016 (H) 11/03/2020   Lab Results  Component Value Date   WBC 6.7 11/29/2020   HGB 12.5 (L) 11/29/2020   HCT 37.1 (L) 11/29/2020   MCV 94 11/29/2020   PLT 219 11/29/2020   Lab Results  Component Value Date   NA 137 11/29/2020   K 5.2 11/29/2020   CO2 22 11/29/2020   GLUCOSE 454 (H) 11/29/2020   BUN 20 11/29/2020   CREATININE 1.12 11/29/2020   BILITOT 0.4 11/29/2020   ALKPHOS 91 11/29/2020   AST 12 11/29/2020   ALT 12 11/29/2020   PROT 6.4 11/29/2020   ALBUMIN 4.4 11/29/2020   CALCIUM 9.3 11/29/2020   ANIONGAP 10 11/06/2020   EGFR 72 11/29/2020   Lab Results  Component  Value Date   CHOL 194 11/29/2020   Lab Results  Component Value Date   HDL 52 11/29/2020   Lab Results  Component Value Date   LDLCALC 115 (H) 11/29/2020   Lab Results  Component Value Date   TRIG 156 (H) 11/29/2020   Lab Results  Component Value Date   CHOLHDL 3.0 08/11/2019   Lab Results  Component Value Date   HGBA1C >15.5 (H) 11/05/2020      Assessment & Plan:   Problem List Items Addressed This Visit       Endocrine   Type 2 diabetes mellitus with hyperglycemia (HCC)   Relevant Medications   rosuvastatin (CRESTOR) 20 MG tablet   insulin aspart protamine - aspart (NOVOLOG MIX 70/30 FLEXPEN) (70-30) 100 UNIT/ML FlexPen   blood glucose meter kit and supplies   Other Relevant Orders   Hemoglobin A1c   Microalbumin / creatinine urine ratio   Uncontrolled type 2 diabetes mellitus with hyperglycemia (HCC)    -was referred to Dr. NDorris Fetchat last OV -no appt scheduled at this time, unsure what happened there; he should call to set appt. -will recheck labs in 3 months -on statin and ARB (with entresto) -Goal A1c < 8, and he has a long way to go to get there -INCREASE 70/30  to 24 units BID from 20 Lab Results  Component Value Date   HGBA1C >15.5 (H) 11/05/2020           Relevant Medications   rosuvastatin (CRESTOR) 20 MG tablet   insulin aspart protamine - aspart (NOVOLOG MIX 70/30 FLEXPEN) (70-30) 100 UNIT/ML FlexPen   blood glucose meter kit and supplies   Hypothyroidism    -will check thyroid with next set of labs       Relevant Orders   TSH + free T4     Other   Mixed hyperlipidemia    Lab Results  Component Value Date   CHOL 194 11/29/2020   HDL 52 11/29/2020   LDLCALC 115 (H) 11/29/2020   TRIG 156 (H) 11/29/2020   CHOLHDL 3.0 08/11/2019  -goal LDL < 70 d/t DM -INCREASE rosuvastatin to 20 mg        Relevant Medications   rosuvastatin (CRESTOR) 20 MG tablet   Other Relevant Orders   Lipid Panel With LDL/HDL Ratio   Welcome to Medicare  preventive visit - Primary   Relevant Orders   CBC with Differential/Platelet   CMP14+EGFR   Lipid Panel With LDL/HDL Ratio   Hemoglobin A1c   Microalbumin / creatinine urine ratio   PSA   Iron, TIBC and Ferritin Panel   TSH + free T4   Anemia    -kidney function is OK -he denies blood in stool, but I'll order hemoccults -will get iron panel with next set of labs, his sample is old a this point       Relevant Orders   Iron, TIBC and Ferritin Panel   Nocturia    -Rx. myrbetriq -if no improvement, may consider urology consult vs home sleep study to r/o sleep apnea       Relevant Medications   mirabegron ER (MYRBETRIQ) 25 MG TB24 tablet    Meds ordered this encounter  Medications   rosuvastatin (CRESTOR) 20 MG tablet    Sig: Take 1 tablet (20 mg total) by mouth daily.    Dispense:  90 tablet    Refill:  3   insulin aspart protamine - aspart (NOVOLOG MIX 70/30 FLEXPEN) (70-30) 100 UNIT/ML FlexPen    Sig: Inject 0.24 mLs (24 Units total) into the skin 2 (two) times daily with a meal.    Dispense:  15 mL    Refill:  0   blood glucose meter kit and supplies    Sig: Dispense based on patient and insurance preference. Use four times daily as directed. (FOR ICD-10 E11.65).    Dispense:  1 each    Refill:  0    No meter needed, just increase lancets and strips to QID usage    Order Specific Question:   Number of strips    Answer:   300    Order Specific Question:   Number of lancets    Answer:   300   mirabegron ER (MYRBETRIQ) 25 MG TB24 tablet    Sig: Take 1 tablet (25 mg total) by mouth daily.    Dispense:  30 tablet    Refill:  1    Follow-up: Return in about 3 months (around 03/13/2021) for Lab follow-up (DM, thyroid, anemia, etc.).    Noreene Larsson, NP

## 2020-12-11 NOTE — Assessment & Plan Note (Signed)
-  EKG showed NSR with an old anterior infacrt; pt has hx of CHF and LV systolic dysfunction

## 2020-12-11 NOTE — Assessment & Plan Note (Signed)
-  Rx. myrbetriq -if no improvement, may consider urology consult vs home sleep study to r/o sleep apnea

## 2020-12-11 NOTE — Assessment & Plan Note (Addendum)
-  was referred to Dr. Fransico Him at last OV -no appt scheduled at this time, unsure what happened there; he should call to set appt. -will recheck labs in 3 months -on statin and ARB (with entresto) -Goal A1c < 8, and he has a long way to go to get there -INCREASE 70/30 to 24 units BID from 20 Lab Results  Component Value Date   HGBA1C >15.5 (H) 11/05/2020

## 2020-12-18 ENCOUNTER — Telehealth: Payer: Self-pay

## 2020-12-18 NOTE — Telephone Encounter (Signed)
[facility] called case # [id number] issues with diagnosis code on cologuard. Call back # [phone].

## 2020-12-19 ENCOUNTER — Other Ambulatory Visit: Payer: Self-pay

## 2020-12-19 NOTE — Telephone Encounter (Signed)
Needed updated dx code, informed.

## 2020-12-25 ENCOUNTER — Ambulatory Visit: Payer: Medicare HMO

## 2020-12-25 ENCOUNTER — Other Ambulatory Visit: Payer: Self-pay

## 2020-12-25 LAB — HM DIABETES EYE EXAM

## 2020-12-27 ENCOUNTER — Telehealth: Payer: Self-pay

## 2020-12-27 ENCOUNTER — Encounter: Payer: Self-pay | Admitting: *Deleted

## 2020-12-27 ENCOUNTER — Other Ambulatory Visit: Payer: Self-pay | Admitting: Nurse Practitioner

## 2020-12-27 ENCOUNTER — Other Ambulatory Visit: Payer: Self-pay | Admitting: *Deleted

## 2020-12-27 DIAGNOSIS — E1165 Type 2 diabetes mellitus with hyperglycemia: Secondary | ICD-10-CM

## 2020-12-27 DIAGNOSIS — E08319 Diabetes mellitus due to underlying condition with unspecified diabetic retinopathy without macular edema: Secondary | ICD-10-CM

## 2020-12-27 NOTE — Progress Notes (Signed)
I sent referral to endocrinology for poorly controlled diabetes.

## 2020-12-27 NOTE — Telephone Encounter (Signed)
TSH & Free T4 are on PCP's order. We can see those results.

## 2020-12-27 NOTE — Progress Notes (Signed)
fer

## 2020-12-27 NOTE — Telephone Encounter (Signed)
Can you update his lab orders 

## 2021-01-04 ENCOUNTER — Other Ambulatory Visit: Payer: Self-pay | Admitting: Nurse Practitioner

## 2021-01-11 ENCOUNTER — Encounter: Payer: Self-pay | Admitting: Cardiology

## 2021-01-11 ENCOUNTER — Ambulatory Visit: Payer: Medicare HMO | Admitting: Cardiology

## 2021-01-11 VITALS — BP 160/94 | HR 82 | Ht 68.0 in | Wt 199.8 lb

## 2021-01-11 DIAGNOSIS — I428 Other cardiomyopathies: Secondary | ICD-10-CM

## 2021-01-11 DIAGNOSIS — E1165 Type 2 diabetes mellitus with hyperglycemia: Secondary | ICD-10-CM | POA: Diagnosis not present

## 2021-01-11 NOTE — Patient Instructions (Addendum)
Medication Instructions:  Your physician recommends that you continue on your current medications as directed. Please refer to the Current Medication list given to you today.  Labwork: none  Testing/Procedures: Reschedule 2 D Echo with definity  Follow-Up: Your physician recommends that you schedule a follow-up appointment in: pending  Any Other Special Instructions Will Be Listed Below (If Applicable).  If you need a refill on your cardiac medications before your next appointment, please call your pharmacy.

## 2021-01-11 NOTE — Progress Notes (Signed)
Cardiology Office Note  Date: 01/11/2021   ID: Stanley, Levy 09/03/1955, MRN [id number]  PCP:  Stanley Larsson, NP  Cardiologist:  Stanley Lesches, MD Electrophysiologist:  None   Chief Complaint  Patient presents with   Cardiac follow-up    History of Present Illness: Stanley Levy is a 65 y.o. male last seen in March.  He is here for a follow-up visit.  He reports NYHA class I-II dyspnea, continues to walk 3 days a week for about an hour at a time.  He does not describe any active angina symptoms or palpitations.  Still due for follow-up echocardiogram with Definity contrast as discussed in the last note.  We went over this today.  Blood pressure elevated, he had not yet taken his morning medications.  Last 2 blood pressure checks look much better.  He does not have a cuff at home.  I talked with him about his current regimen and he states that he has been compliant.  I reviewed his lab work from June.  Past Medical History:  Diagnosis Date   Anxiety    Chronic combined systolic and diastolic CHF (congestive heart failure) (HCC)    a. EF 20% with global HK by echo in 08/2019. Also noted to have LV mural thrombus.    Diabetes mellitus, type II ([place])    Foot ulcer ([place])    a. normal ABIs in 2021.   History of noncompliance with medical treatment    Hyperlipidemia    Hypothyroidism    LV (left ventricular) mural thrombus    NICM (nonischemic cardiomyopathy) (HCC)    Nonproliferative diabetic retinopathy ([place])     Past Surgical History:  Procedure Laterality Date   FOOT SURGERY     RIGHT/LEFT HEART CATH AND CORONARY ANGIOGRAPHY N/A 09/20/2019   Procedure: RIGHT/LEFT HEART CATH AND CORONARY ANGIOGRAPHY;  Surgeon: Leonie Man, MD;  Location: [facility];  Service: Cardiovascular;  Laterality: N/A;    Current Outpatient Medications  Medication Sig Dispense Refill   ACCU-CHEK GUIDE test strip USE 4 TIMES A DAY AS DIRECTED 100 strip 3   acetaminophen  (TYLENOL) 325 MG tablet Take 2 tablets (650 mg total) by mouth every 4 (four) hours as needed for headache or mild pain. 12 tablet 0   apixaban (ELIQUIS) 5 MG TABS tablet Take 1 tablet (5 mg total) by mouth 2 (two) times daily. 60 tablet 3   blood glucose meter kit and supplies Dispense based on patient and insurance preference. Use four times daily as directed. (FOR ICD-10 E11.65). 1 each 0   digoxin (LANOXIN) 0.125 MG tablet Take 1 tablet (0.125 mg total) by mouth daily. 30 tablet 3   escitalopram (LEXAPRO) 10 MG tablet Take 1 tablet (10 mg total) by mouth daily. 30 tablet 2   gabapentin (NEURONTIN) 100 MG capsule Take 1 capsule (100 mg total) by mouth 3 (three) times daily. 90 capsule 2   imipramine (TOFRANIL) 50 MG tablet TAKE 1 TABLET BY MOUTH EVERYDAY AT BEDTIME 90 tablet 3   insulin aspart protamine - aspart (NOVOLOG MIX 70/30 FLEXPEN) (70-30) 100 UNIT/ML FlexPen Inject 0.24 mLs (24 Units total) into the skin 2 (two) times daily with a meal. 15 mL 0   Insulin Pen Needle 31G X 5 MM MISC 1 Units by Does not apply route 2 (two) times daily. 100 each 0   Lancets 30G MISC Check BG twice daily, once before breakfast and once before supper. 50 each 11  levothyroxine (SYNTHROID) 25 MCG tablet Take 1 tablet (25 mcg total) by mouth daily before breakfast. 30 tablet 2   metFORMIN (GLUCOPHAGE XR) 500 MG 24 hr tablet Take 1 tablet (500 mg total) by mouth daily with breakfast. 30 tablet 3   metoprolol succinate (TOPROL-XL) 25 MG 24 hr tablet Take 1 tablet (25 mg total) by mouth daily. 90 tablet 3   mirabegron ER (MYRBETRIQ) 25 MG TB24 tablet Take 1 tablet (25 mg total) by mouth daily. 30 tablet 1   OneTouch Delica Lancets 58I MISC Four times daily testing  dx e11.65 150 each 5   potassium chloride SA (KLOR-CON) 20 MEQ tablet Take 2 tablets (40 mEq total) by mouth daily. 90 tablet 3   rosuvastatin (CRESTOR) 20 MG tablet Take 1 tablet (20 mg total) by mouth daily. 90 tablet 3   sacubitril-valsartan  (ENTRESTO) 24-26 MG Take 1 tablet by mouth 2 (two) times daily. 60 tablet 6   spironolactone (ALDACTONE) 25 MG tablet Take 0.5 tablets (12.5 mg total) by mouth daily. 15 tablet 3   tamsulosin (FLOMAX) 0.4 MG CAPS capsule Take 1 capsule (0.4 mg total) by mouth daily after supper. 30 capsule 3   Vitamin D, Ergocalciferol, (DRISDOL) 1.25 MG (50000 UNIT) CAPS capsule Take 1 capsule (50,000 Units total) by mouth every 7 (seven) days. 5 capsule 2   No current facility-administered medications for this visit.   Allergies:  Patient has no known allergies.   ROS: No syncope.  Physical Exam: VS:  BP (!) 160/94   Pulse 82   Ht 5' 8"  (1.727 m)   Wt 199 lb 12.8 oz (90.6 kg)   SpO2 97%   BMI 30.38 kg/m , BMI Body mass index is 30.38 kg/m.  Wt Readings from Last 3 Encounters:  01/11/21 199 lb 12.8 oz (90.6 kg)  12/11/20 198 lb (89.8 kg)  11/29/20 191 lb (86.6 kg)    General: Patient appears comfortable at rest. HEENT: Conjunctiva and lids normal, wearing a mask. Neck: Supple, no elevated JVP or carotid bruits, no thyromegaly. Lungs: Clear to auscultation, nonlabored breathing at rest. Cardiac: Regular rate and rhythm, no S3, 1/6 systolic murmur, no pericardial rub. Extremities: No pitting edema.  ECG:  An ECG dated 12/11/2020 was personally reviewed today and demonstrated:  Sinus rhythm with decreased R wave progression and IVCD.  Recent Labwork: 11/03/2020: TSH 6.016 11/06/2020: Magnesium 2.0 11/29/2020: ALT 12; AST 12; BUN 20; Creatinine, Ser 1.12; Hemoglobin 12.5; Platelets 219; Potassium 5.2; Sodium 137     Component Value Date/Time   CHOL 194 11/29/2020 0854   TRIG 156 (H) 11/29/2020 0854   HDL 52 11/29/2020 0854   CHOLHDL 3.0 08/11/2019 0859   LDLCALC 115 (H) 11/29/2020 0854   LDLCALC 84 08/11/2019 0859    Other Studies Reviewed Today:  Echocardiogram 03/14/2020:  1. Left ventricular ejection fraction, by estimation, is approximately  25%. The left ventricle has severely  decreased function. The left  ventricle demonstrates global hypokinesis. Left ventricular diastolic  parameters are indeterminate. Acoustic  shadowing at apex without definitive mural thrombus - would suggest  limited Definity contrast study given prior history.   2. Right ventricular systolic function is low normal. The right  ventricular size is normal. Tricuspid regurgitation signal is inadequate  for assessing PA pressure.   3. Left atrial size was moderately dilated.   4. The mitral valve is grossly normal. Mild mitral valve regurgitation.   5. The aortic valve is tricuspid. Aortic valve regurgitation is trivial.  6. The inferior vena cava is normal in size with greater than 50%  respiratory variability, suggesting right atrial pressure of 3 mmHg.   Assessment and Plan:  1.  History of nonischemic cardiomyopathy with LVEF 25% and low normal RV contraction as of September 2021.  We have discussed follow-up echocardiogram with Definity contrast, he has not yet presented for testing.  This will help to guide further medical therapy options and also potential EP referral to discuss prophylactic ICD.  For now would continue Lanoxin, Toprol-XL, Entresto, and Aldactone.  I reviewed his lab work from June.  2.  History of LV mural thrombus, he remains on Eliquis.  This will be reassessed with Definity contrast.  3.  Poorly controlled type 2 diabetes mellitus, following with PCP on Glucophage XR and insulin.  Can discuss SGLT2 inhibitor depending on his follow-up echocardiogram.  Medication Adjustments/Labs and Tests Ordered: Current medicines are reviewed at length with the patient today.  Concerns regarding medicines are outlined above.   Tests Ordered: Orders Placed This Encounter  Procedures   ECHOCARDIOGRAM COMPLETE     Medication Changes: No orders of the defined types were placed in this encounter.   Disposition:  Follow up  test results.  Signed, Satira Sark, MD,  [facility] 01/11/2021 8:40 AM    [facility] [address], [place], [place] [address] Phone: [phone]; Fax: [phone]

## 2021-01-16 ENCOUNTER — Encounter: Payer: Medicare HMO | Admitting: Nurse Practitioner

## 2021-01-16 ENCOUNTER — Other Ambulatory Visit: Payer: Self-pay

## 2021-01-16 NOTE — Progress Notes (Signed)
Erroneous encounter

## 2021-01-16 NOTE — Patient Instructions (Signed)
Diabetes Mellitus and Nutrition, Adult When you have diabetes, or diabetes mellitus, it is very important to have healthy eating habits because your blood sugar (glucose) levels are greatly affected by what you eat and drink. Eating healthy foods in the right amounts, at about the same times every day, can help you:  Control your blood glucose.  Lower your risk of heart disease.  Improve your blood pressure.  Reach or maintain a healthy weight. What can affect my meal plan? Every person with diabetes is different, and each person has different needs for a meal plan. Your health care provider may recommend that you work with a dietitian to make a meal plan that is best for you. Your meal plan may vary depending on factors such as:  The calories you need.  The medicines you take.  Your weight.  Your blood glucose, blood pressure, and cholesterol levels.  Your activity level.  Other health conditions you have, such as heart or kidney disease. How do carbohydrates affect me? Carbohydrates, also called carbs, affect your blood glucose level more than any other type of food. Eating carbs naturally raises the amount of glucose in your blood. Carb counting is a method for keeping track of how many carbs you eat. Counting carbs is important to keep your blood glucose at a healthy level, especially if you use insulin or take certain oral diabetes medicines. It is important to know how many carbs you can safely have in each meal. This is different for every person. Your dietitian can help you calculate how many carbs you should have at each meal and for each snack. How does alcohol affect me? Alcohol can cause a sudden decrease in blood glucose (hypoglycemia), especially if you use insulin or take certain oral diabetes medicines. Hypoglycemia can be a life-threatening condition. Symptoms of hypoglycemia, such as sleepiness, dizziness, and confusion, are similar to symptoms of having too much  alcohol.  Do not drink alcohol if: ? Your health care provider tells you not to drink. ? You are pregnant, may be pregnant, or are planning to become pregnant.  If you drink alcohol: ? Do not drink on an empty stomach. ? Limit how much you use to:  0-1 drink a day for women.  0-2 drinks a day for men. ? Be aware of how much alcohol is in your drink. In the [place] one drink equals one 12 oz bottle of beer (355 mL), one 5 oz glass of wine (148 mL), or one 1 oz glass of hard liquor (44 mL). ? Keep yourself hydrated with water, diet soda, or unsweetened iced tea.  Keep in mind that regular soda, juice, and other mixers may contain a lot of sugar and must be counted as carbs. What are tips for following this plan? Reading food labels  Start by checking the serving size on the "Nutrition Facts" label of packaged foods and drinks. The amount of calories, carbs, fats, and other nutrients listed on the label is based on one serving of the item. Many items contain more than one serving per package.  Check the total grams (g) of carbs in one serving. You can calculate the number of servings of carbs in one serving by dividing the total carbs by 15. For example, if a food has 30 g of total carbs per serving, it would be equal to 2 servings of carbs.  Check the number of grams (g) of saturated fats and trans fats in one serving. Choose foods that have   a low amount or none of these fats.  Check the number of milligrams (mg) of salt (sodium) in one serving. Most people should limit total sodium intake to less than 2,300 mg per day.  Always check the nutrition information of foods labeled as "low-fat" or "nonfat." These foods may be higher in added sugar or refined carbs and should be avoided.  Talk to your dietitian to identify your daily goals for nutrients listed on the label. Shopping  Avoid buying canned, pre-made, or processed foods. These foods tend to be high in fat, sodium, and added  sugar.  Shop around the outside edge of the grocery store. This is where you will most often find fresh fruits and vegetables, bulk grains, fresh meats, and fresh dairy. Cooking  Use low-heat cooking methods, such as baking, instead of high-heat cooking methods like deep frying.  Cook using healthy oils, such as olive, canola, or sunflower oil.  Avoid cooking with butter, cream, or high-fat meats. Meal planning  Eat meals and snacks regularly, preferably at the same times every day. Avoid going long periods of time without eating.  Eat foods that are high in fiber, such as fresh fruits, vegetables, beans, and whole grains. Talk with your dietitian about how many servings of carbs you can eat at each meal.  Eat 4-6 oz (112-168 g) of lean protein each day, such as lean meat, chicken, fish, eggs, or tofu. One ounce (oz) of lean protein is equal to: ? 1 oz (28 g) of meat, chicken, or fish. ? 1 egg. ?  cup (62 g) of tofu.  Eat some foods each day that contain healthy fats, such as avocado, nuts, seeds, and fish.   What foods should I eat? Fruits Berries. Apples. Oranges. Peaches. Apricots. Plums. Grapes. Mango. Papaya. Pomegranate. Kiwi. Cherries. Vegetables Lettuce. Spinach. Leafy greens, including kale, chard, collard greens, and mustard greens. Beets. Cauliflower. Cabbage. Broccoli. Carrots. Green beans. Tomatoes. Peppers. Onions. Cucumbers. Brussels sprouts. Grains Whole grains, such as whole-wheat or whole-grain bread, crackers, tortillas, cereal, and pasta. Unsweetened oatmeal. Quinoa. Brown or wild rice. Meats and other proteins Seafood. Poultry without skin. Lean cuts of poultry and beef. Tofu. Nuts. Seeds. Dairy Low-fat or fat-free dairy products such as milk, yogurt, and cheese. The items listed above may not be a complete list of foods and beverages you can eat. Contact a dietitian for more information. What foods should I avoid? Fruits Fruits canned with  syrup. Vegetables Canned vegetables. Frozen vegetables with butter or cream sauce. Grains Refined white flour and flour products such as bread, pasta, snack foods, and cereals. Avoid all processed foods. Meats and other proteins Fatty cuts of meat. Poultry with skin. Breaded or fried meats. Processed meat. Avoid saturated fats. Dairy Full-fat yogurt, cheese, or milk. Beverages Sweetened drinks, such as soda or iced tea. The items listed above may not be a complete list of foods and beverages you should avoid. Contact a dietitian for more information. Questions to ask a health care provider  Do I need to meet with a diabetes educator?  Do I need to meet with a dietitian?  What number can I call if I have questions?  When are the best times to check my blood glucose? Where to find more information:  American Diabetes Association: diabetes.org  Academy of Nutrition and Dietetics: www.eatright.org  National Institute of Diabetes and Digestive and Kidney Diseases: www.niddk.nih.gov  Association of Diabetes Care and Education Specialists: www.diabeteseducator.org Summary  It is important to have healthy eating   habits because your blood sugar (glucose) levels are greatly affected by what you eat and drink.  A healthy meal plan will help you control your blood glucose and maintain a healthy lifestyle.  Your health care provider may recommend that you work with a dietitian to make a meal plan that is best for you.  Keep in mind that carbohydrates (carbs) and alcohol have immediate effects on your blood glucose levels. It is important to count carbs and to use alcohol carefully. This information is not intended to replace advice given to you by your health care provider. Make sure you discuss any questions you have with your health care provider. Document Revised: 05/24/2019 Document Reviewed: 05/24/2019 Elsevier Patient Education  2021 [employer] Inc.  

## 2021-01-18 ENCOUNTER — Ambulatory Visit (HOSPITAL_COMMUNITY)
Admission: RE | Admit: 2021-01-18 | Discharge: 2021-01-18 | Disposition: A | Discharge status: Home / Self Care | Payer: Medicare HMO | Source: Ambulatory Visit | Attending: Cardiology | Admitting: Cardiology

## 2021-01-18 ENCOUNTER — Other Ambulatory Visit: Payer: Self-pay

## 2021-01-18 DIAGNOSIS — I428 Other cardiomyopathies: Secondary | ICD-10-CM | POA: Insufficient documentation

## 2021-01-18 LAB — ECHOCARDIOGRAM COMPLETE
Area-P 1/2: 2.76 cm2
Calc EF: 37.9 %
S' Lateral: 4.8 cm
Single Plane A2C EF: 31.4 %
Single Plane A4C EF: 39.9 %

## 2021-01-18 MED ORDER — PERFLUTREN LIPID MICROSPHERE
1.0000 mL | INTRAVENOUS | Status: AC | PRN
Start: 2021-01-18 — End: 2021-01-18
  Administered 2021-01-18: 2 mL via INTRAVENOUS
  Filled 2021-01-18: qty 10

## 2021-01-18 NOTE — Progress Notes (Addendum)
*  PRELIMINARY RESULTS* Echocardiogram 2D Echocardiogram has been performed with Definity.  Stacey Drain 01/18/2021, 10:16 AM

## 2021-01-20 ENCOUNTER — Other Ambulatory Visit: Payer: Self-pay | Admitting: Nurse Practitioner

## 2021-01-20 DIAGNOSIS — E1165 Type 2 diabetes mellitus with hyperglycemia: Secondary | ICD-10-CM

## 2021-01-22 IMAGING — CT CT ANGIO CHEST
2 of 6 series · 18 of 46 positions shown · IV contrast (Omnipaque or Isovue)
Comparison: 08/15/2019

CLINICAL DATA: Short of breath, elevated D dimer

EXAM:
CT ANGIOGRAPHY CHEST WITH CONTRAST
TECHNIQUE: Multidetector CT imaging of the chest was performed using the
standard protocol during bolus administration of intravenous
contrast. Multiplanar CT image reconstructions and MIPs were
obtained to evaluate the vascular anatomy.
CONTRAST:  100mL OMNIPAQUE IOHEXOL 350 MG/ML SOLN

[Series 5: pe axial thins · axial · 0.67mm/px · z∈[-303,-57]mm · 15 of 270 slices shown]
[im 12/270  lung]
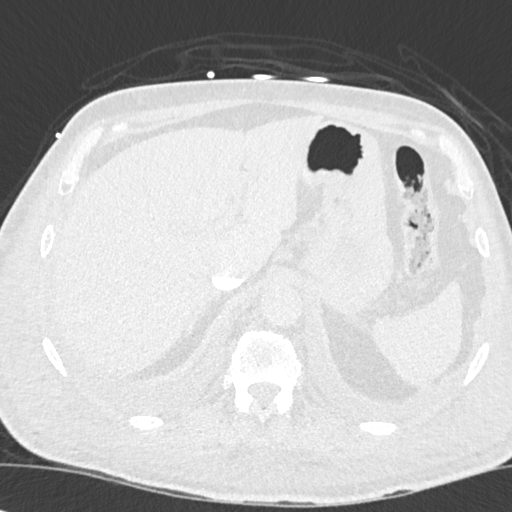
[im 36/270  soft-tissue]
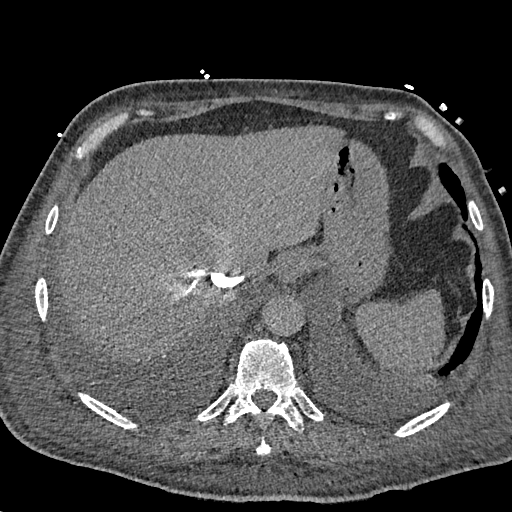
[im 47/270  lung]
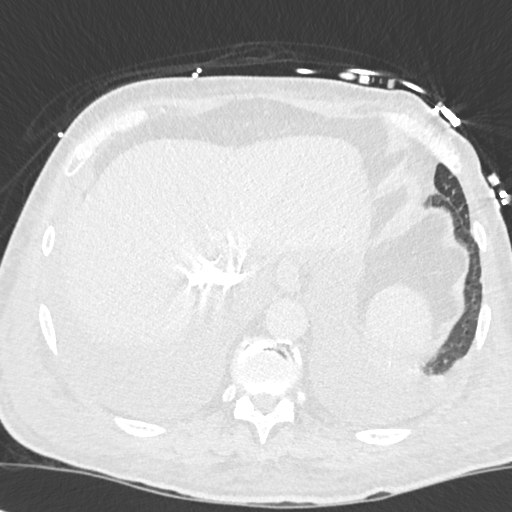
[im 71/270  soft-tissue]
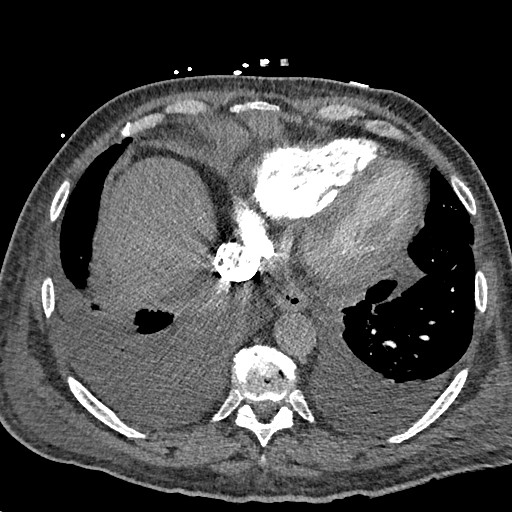
[im 82/270  lung]
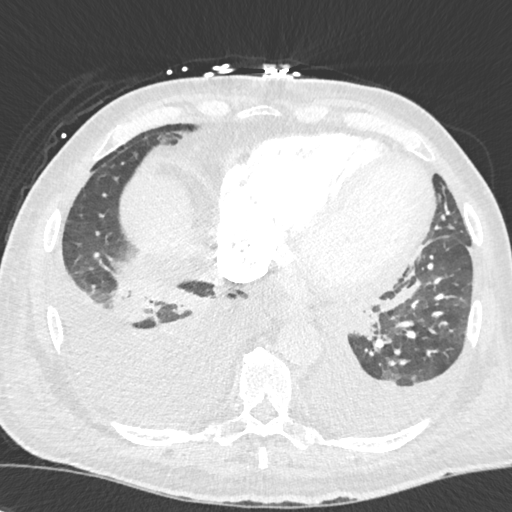
[im 106/270  soft-tissue]
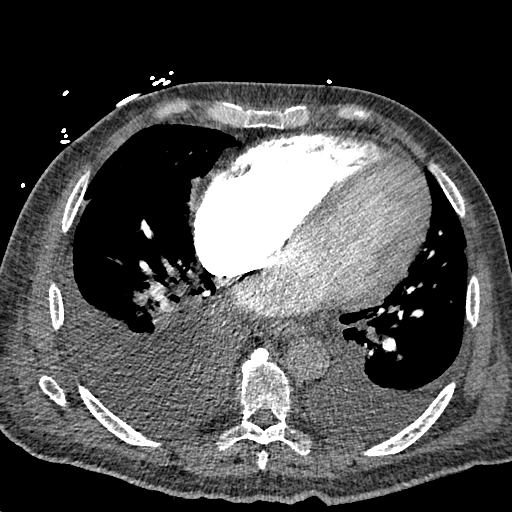
[im 117/270  lung]
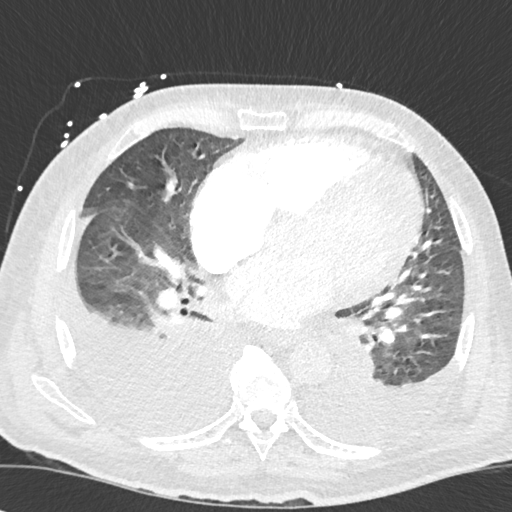
[im 141/270  soft-tissue]
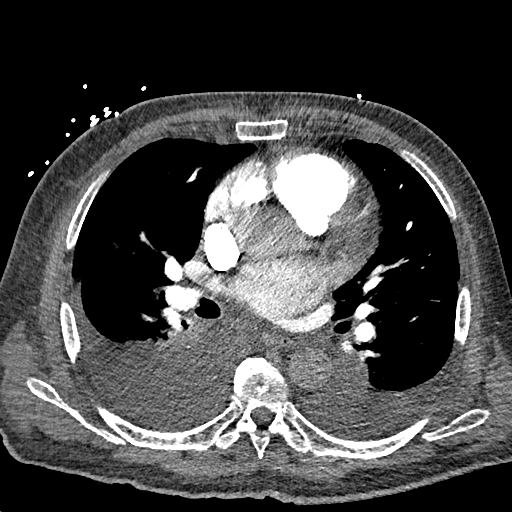
[im 153/270  lung]
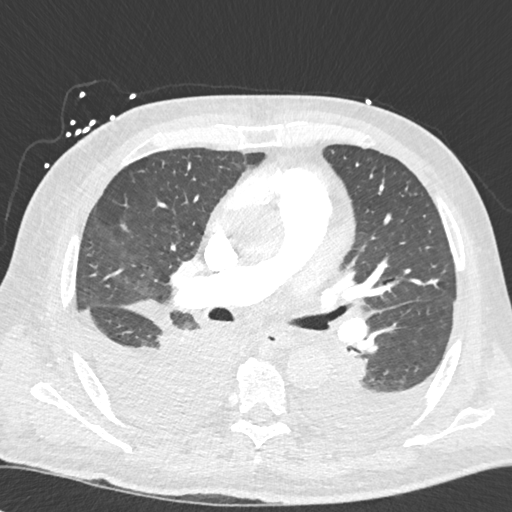
[im 164/270  soft-tissue]
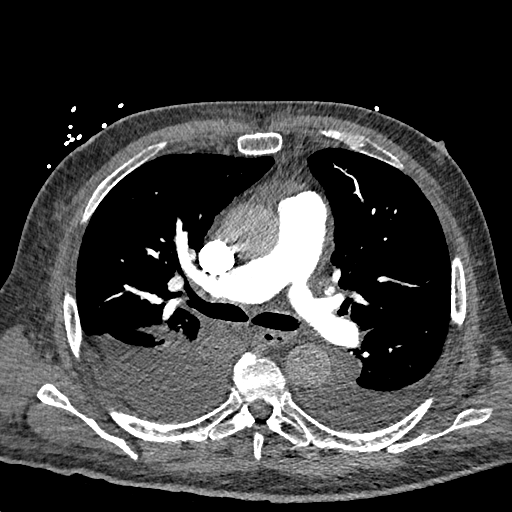
[im 188/270  lung]
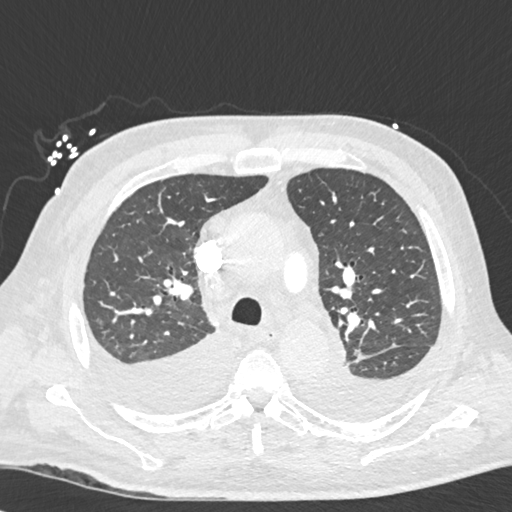
[im 199/270  soft-tissue]
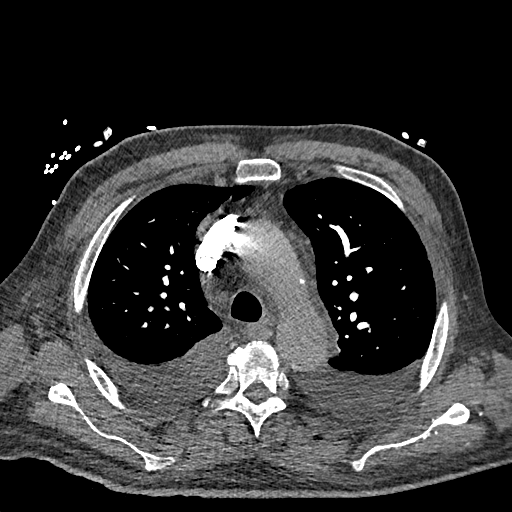
[im 223/270  lung]
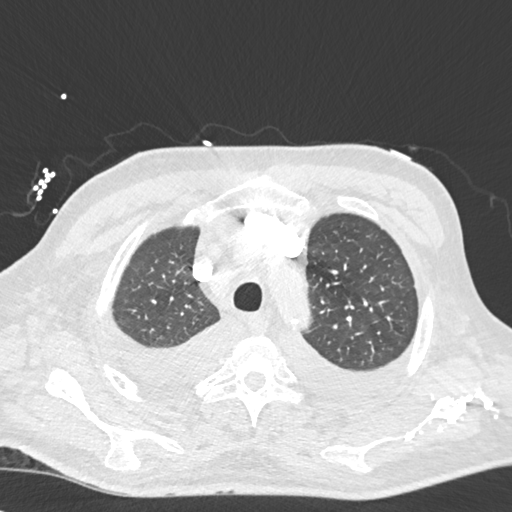
[im 234/270  soft-tissue]
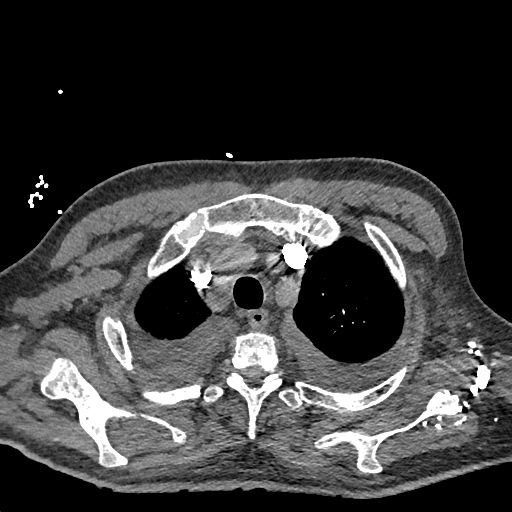
[im 258/270  lung]
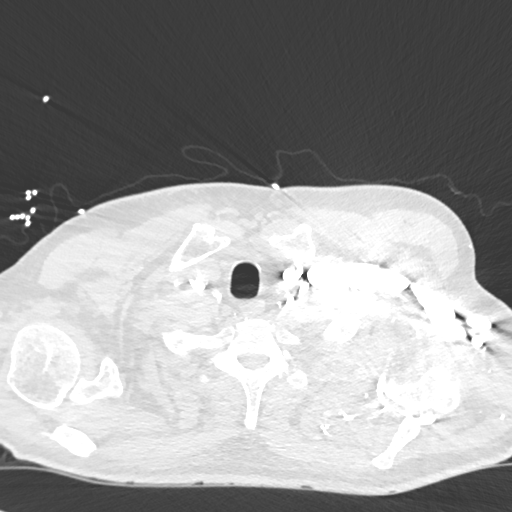

[Series 8: cor soft · coronal · 0.54mm/px · 3 of 151 slices shown]
[im 38/151  soft-tissue]
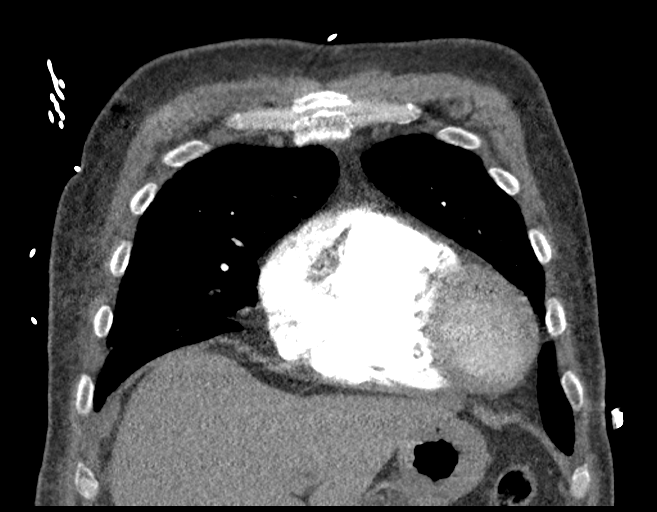
[im 76/151  soft-tissue]
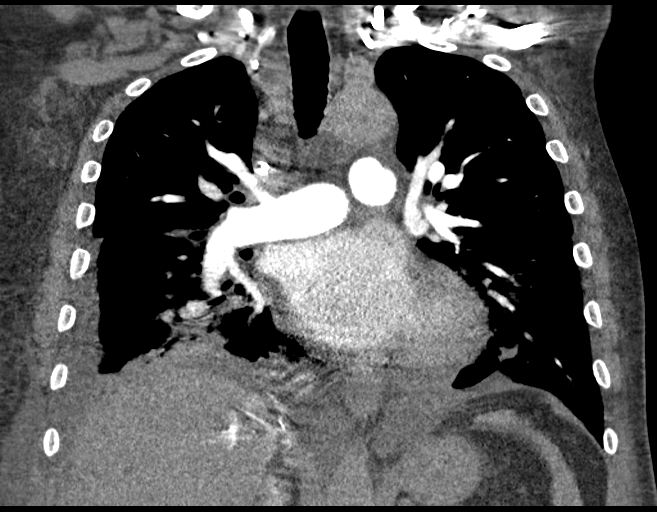
[im 113/151  soft-tissue]
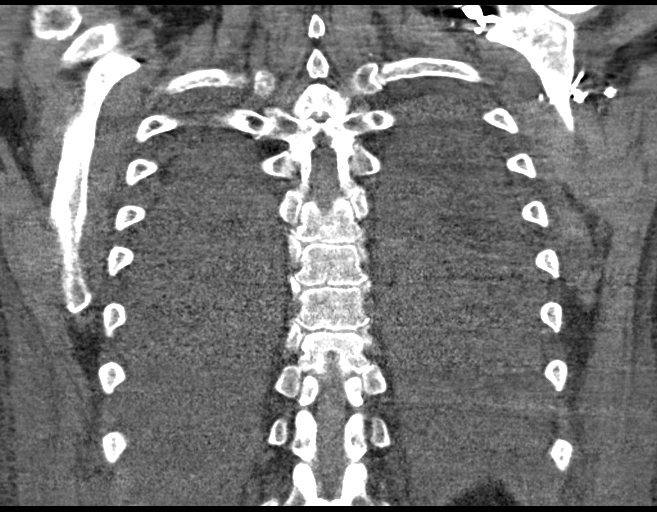

[18 of 46 positions shown; findings below may reference images not displayed]

FINDINGS: Cardiovascular: This is a technically adequate evaluation of the
pulmonary vasculature. There are no filling defects or pulmonary
emboli. Heart is enlarged without pericardial effusion. Thoracic
aorta is normal in caliber.

Mediastinum/Nodes: Multiple subcentimeter lymph nodes within the
mediastinum and hilar nonspecific. No pathologic adenopathy.

Lungs/Pleura: There are bilateral pleural effusions volume estimated
less than 1 L each. Significant compressive atelectasis within the
right lower lobe. Central airways are patent. No pneumothorax.

Upper Abdomen: No acute abnormality.

Musculoskeletal: No acute or destructive bony lesions. Reconstructed
images demonstrate no additional findings.

Review of the MIP images confirms the above findings.
IMPRESSION: 1. No evidence of pulmonary embolus.
2. Bilateral pleural effusions with lower lobe compressive
atelectasis.
3. Cardiomegaly.

## 2021-01-23 ENCOUNTER — Telehealth: Payer: Self-pay | Admitting: *Deleted

## 2021-01-23 DIAGNOSIS — I428 Other cardiomyopathies: Secondary | ICD-10-CM

## 2021-01-23 DIAGNOSIS — Z79899 Other long term (current) drug therapy: Secondary | ICD-10-CM

## 2021-01-23 NOTE — Telephone Encounter (Signed)
-----   Message from Jonelle Sidle, MD sent at 01/18/2021 11:02 AM EDT ----- Results reviewed.  LVEF continues to show improvement, now up into the range of 30 to 35%, RV contraction also normal.  Suggest continuing current regimen, add Farxiga 10 mg daily.  Arrange follow-up visit in the next month with [facility].  Hold off on pursuing EP consultation.

## 2021-01-24 MED ORDER — DAPAGLIFLOZIN PROPANEDIOL 10 MG PO TABS: 10.0000 mg | ORAL_TABLET | Freq: Every day | ORAL | 6 refills | Status: AC

## 2021-01-24 NOTE — Telephone Encounter (Signed)
Patient informed and verbalized understanding of plan. Lab order sent to [facility] Lab Copy sent to PCP

## 2021-01-29 IMAGING — US US ABDOMEN LIMITED
1 series · 14 of 25 positions shown · non-contrast
Comparison: CT chest of 08/15/2019

CLINICAL DATA: Increased LFTs

EXAM:
ULTRASOUND ABDOMEN LIMITED RIGHT UPPER QUADRANT

[Series 1: us abdomen limited · 14 of 56 slices shown]
[im 1/56]
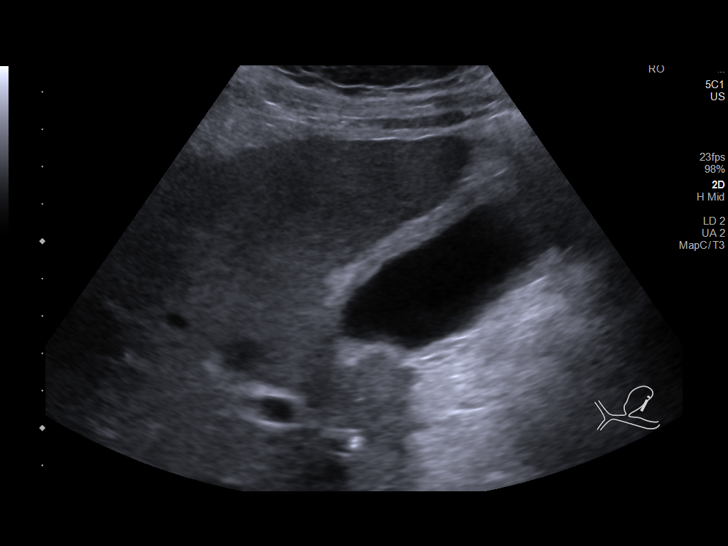
[im 5/56]
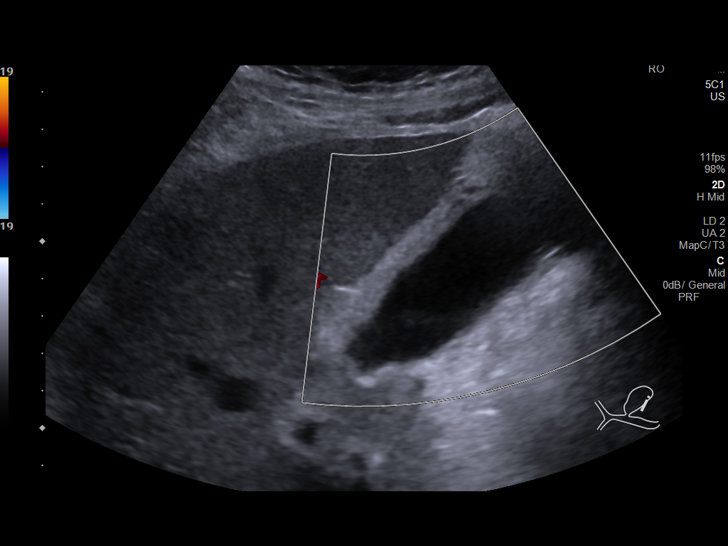
[im 10/56]
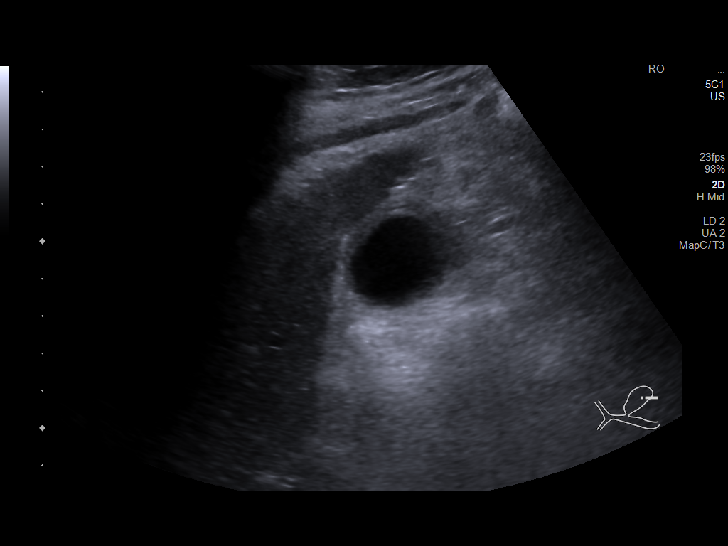
[im 14/56]
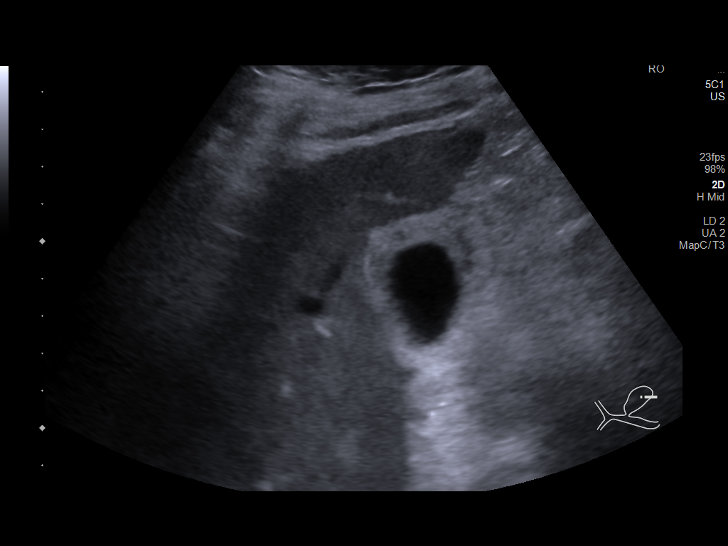
[im 19/56]
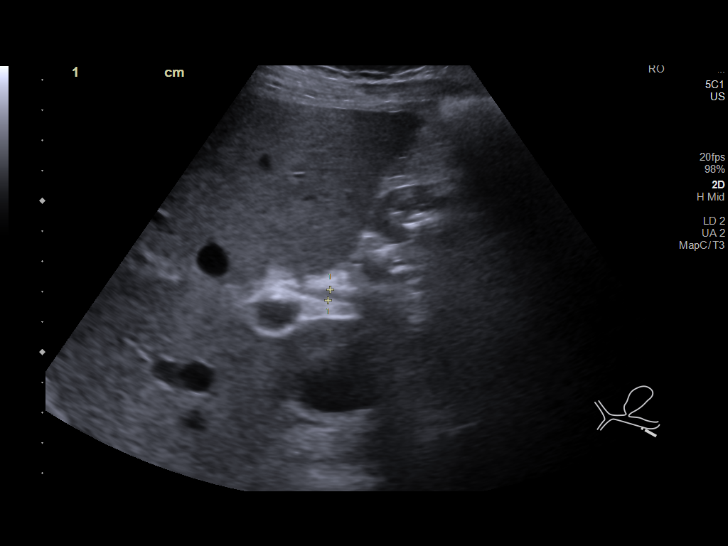
[im 21/56]
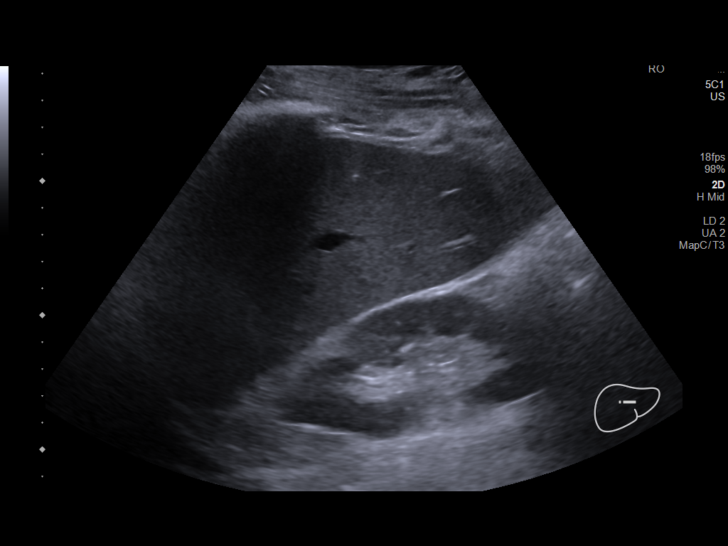
[im 26/56]
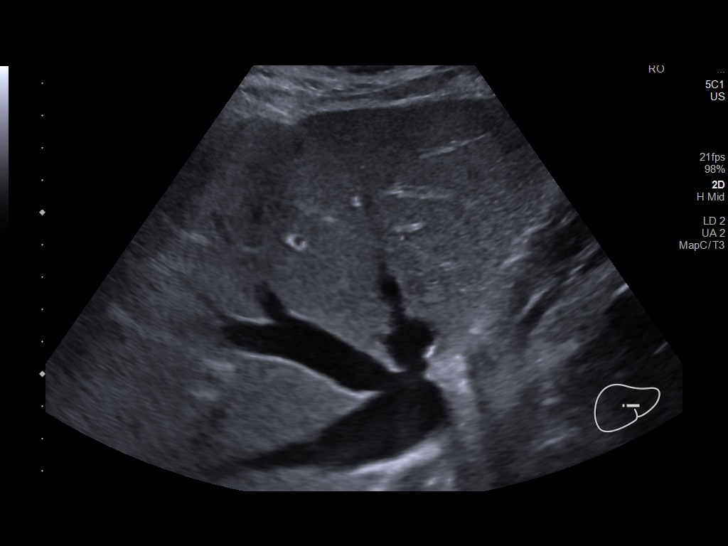
[im 30/56]
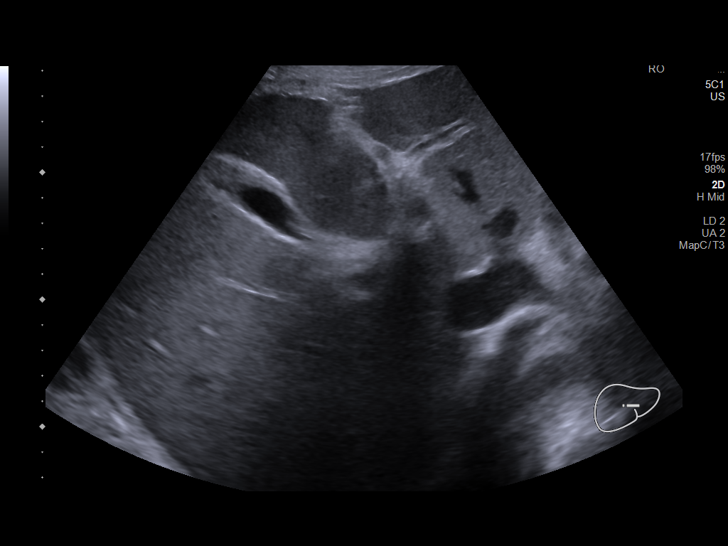
[im 35/56]
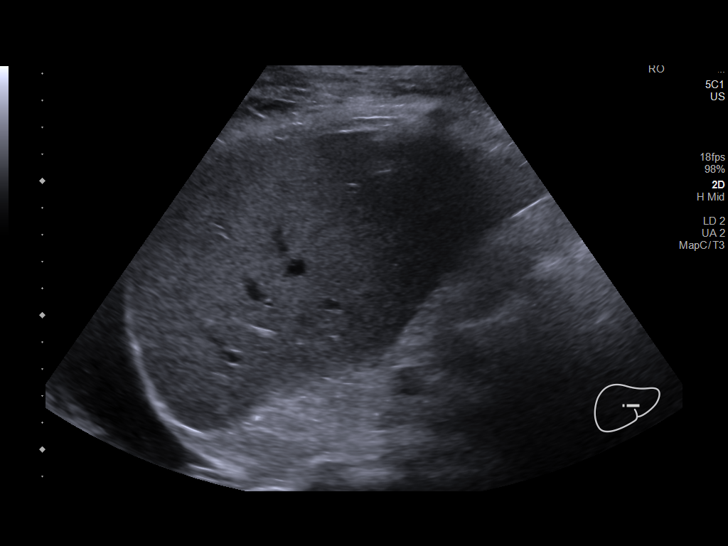
[im 37/56]
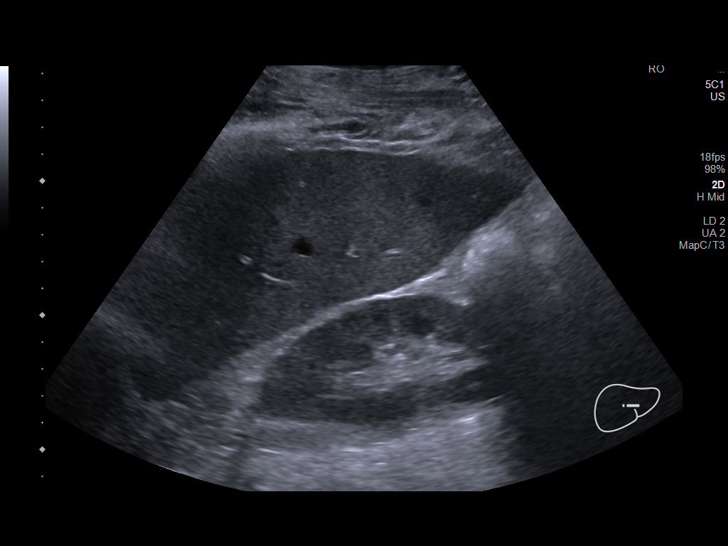
[im 42/56]
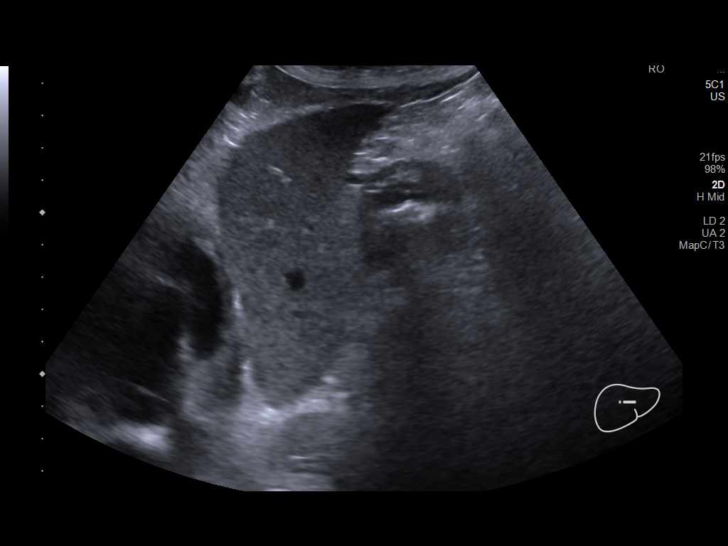
[im 46/56]
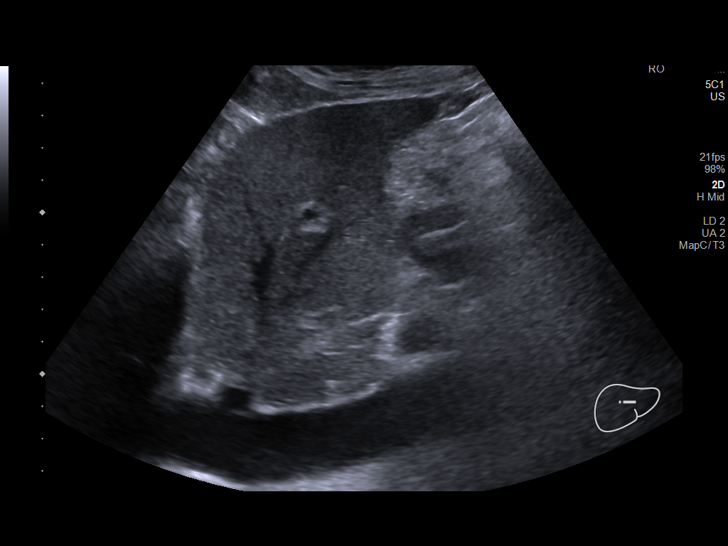
[im 51/56]
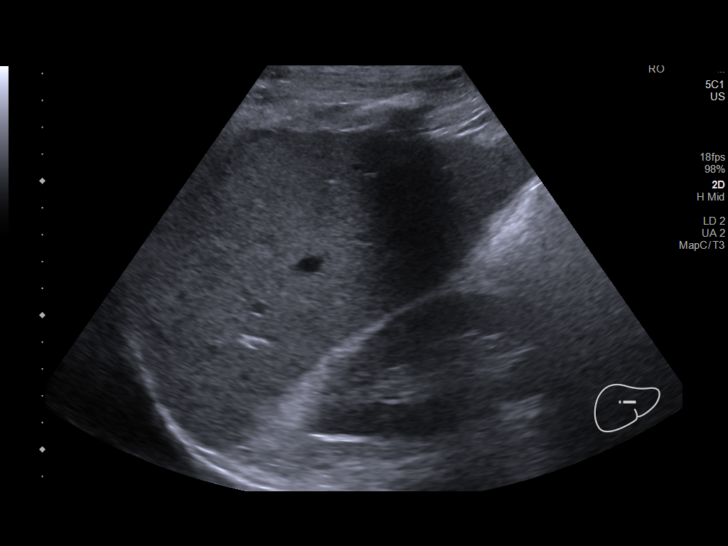
[im 56/56]
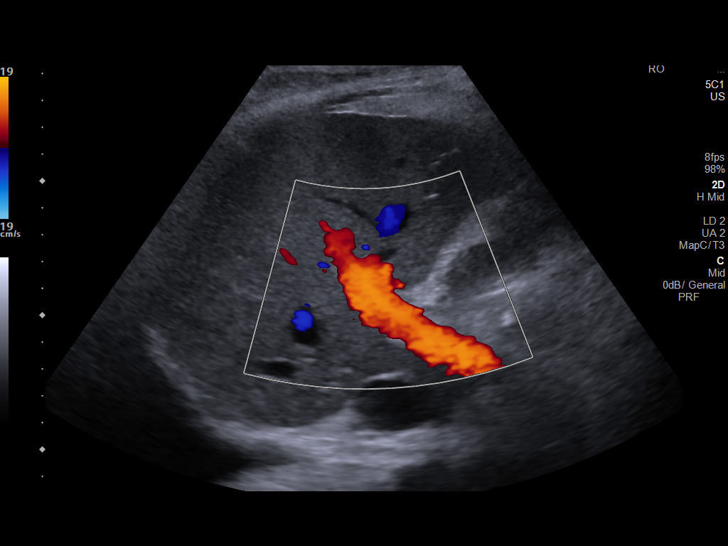

[14 of 25 positions shown; findings below may reference images not displayed]

FINDINGS: Gallbladder:

Signs of gallbladder wall thickening without reported sonographic
Murphy's sign. Edema is present in the wall the gallbladder,
thickness approximately 5 to 6 mm. No pericholecystic fluid.

Common bile duct:

Diameter: 3.6 mm

Liver:

Mildly lobular and question of nodular liver contours. Trace
perihepatic fluid. Portal vein is patent on color Doppler imaging
with normal direction of blood flow towards the liver.

Other: Right-sided pleural seen as on the recent CT of the chest.
IMPRESSION: 1. Gallbladder wall edema, nonspecific finding in the setting of
heart failure. If there is clinical concern for acute gallbladder
pathology HIDA scan could potentially be helpful.
2. Small volume ascites and right-sided pleural effusion likely
cardiogenic.
3. Nodular hepatic contours are suggested which could be related to
congestive hepatopathy.

## 2021-01-30 ENCOUNTER — Telehealth: Payer: Self-pay

## 2021-01-30 NOTE — Telephone Encounter (Signed)
Pt came for an appt with Ronny Bacon NP on 7/20, he was unable to be seen as he did not provide a meter or logs for the appt. He was rescheduled for 8/4. Patient called and moved this one month out. I asked patient was he checking his sugar and he said he doesn't know but thinks he is checking it once a day. Patient was made aware he must bring a meter with accurate readings to his next appt

## 2021-01-31 ENCOUNTER — Other Ambulatory Visit: Payer: Self-pay | Admitting: Nurse Practitioner

## 2021-01-31 ENCOUNTER — Ambulatory Visit: Payer: Medicare HMO | Admitting: "Endocrinology

## 2021-01-31 IMAGING — DX DG CHEST 2V
2 series · 2 of 2 positions shown · non-contrast
Comparison: 08/19/2019

CLINICAL DATA: Lower extremity swelling.

EXAM:
CHEST - 2 VIEW

[chest lat]
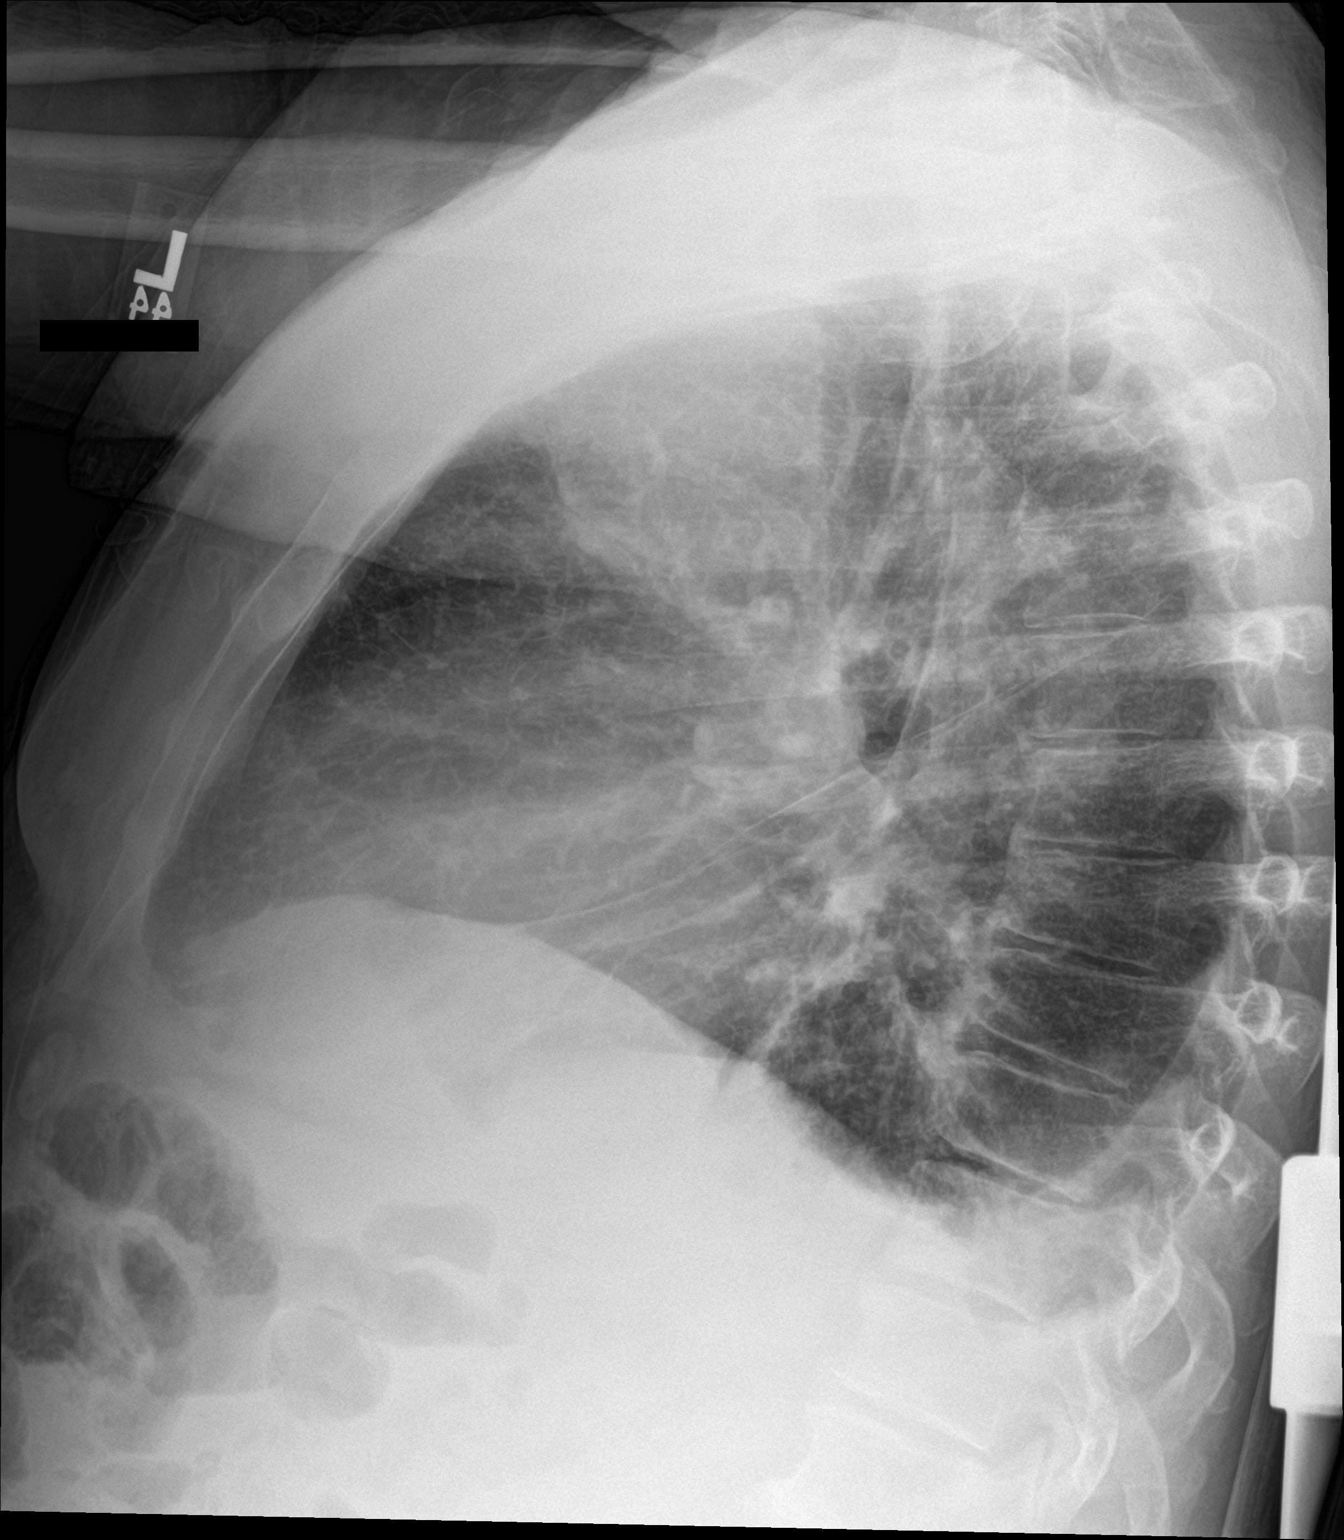

[chest ap]
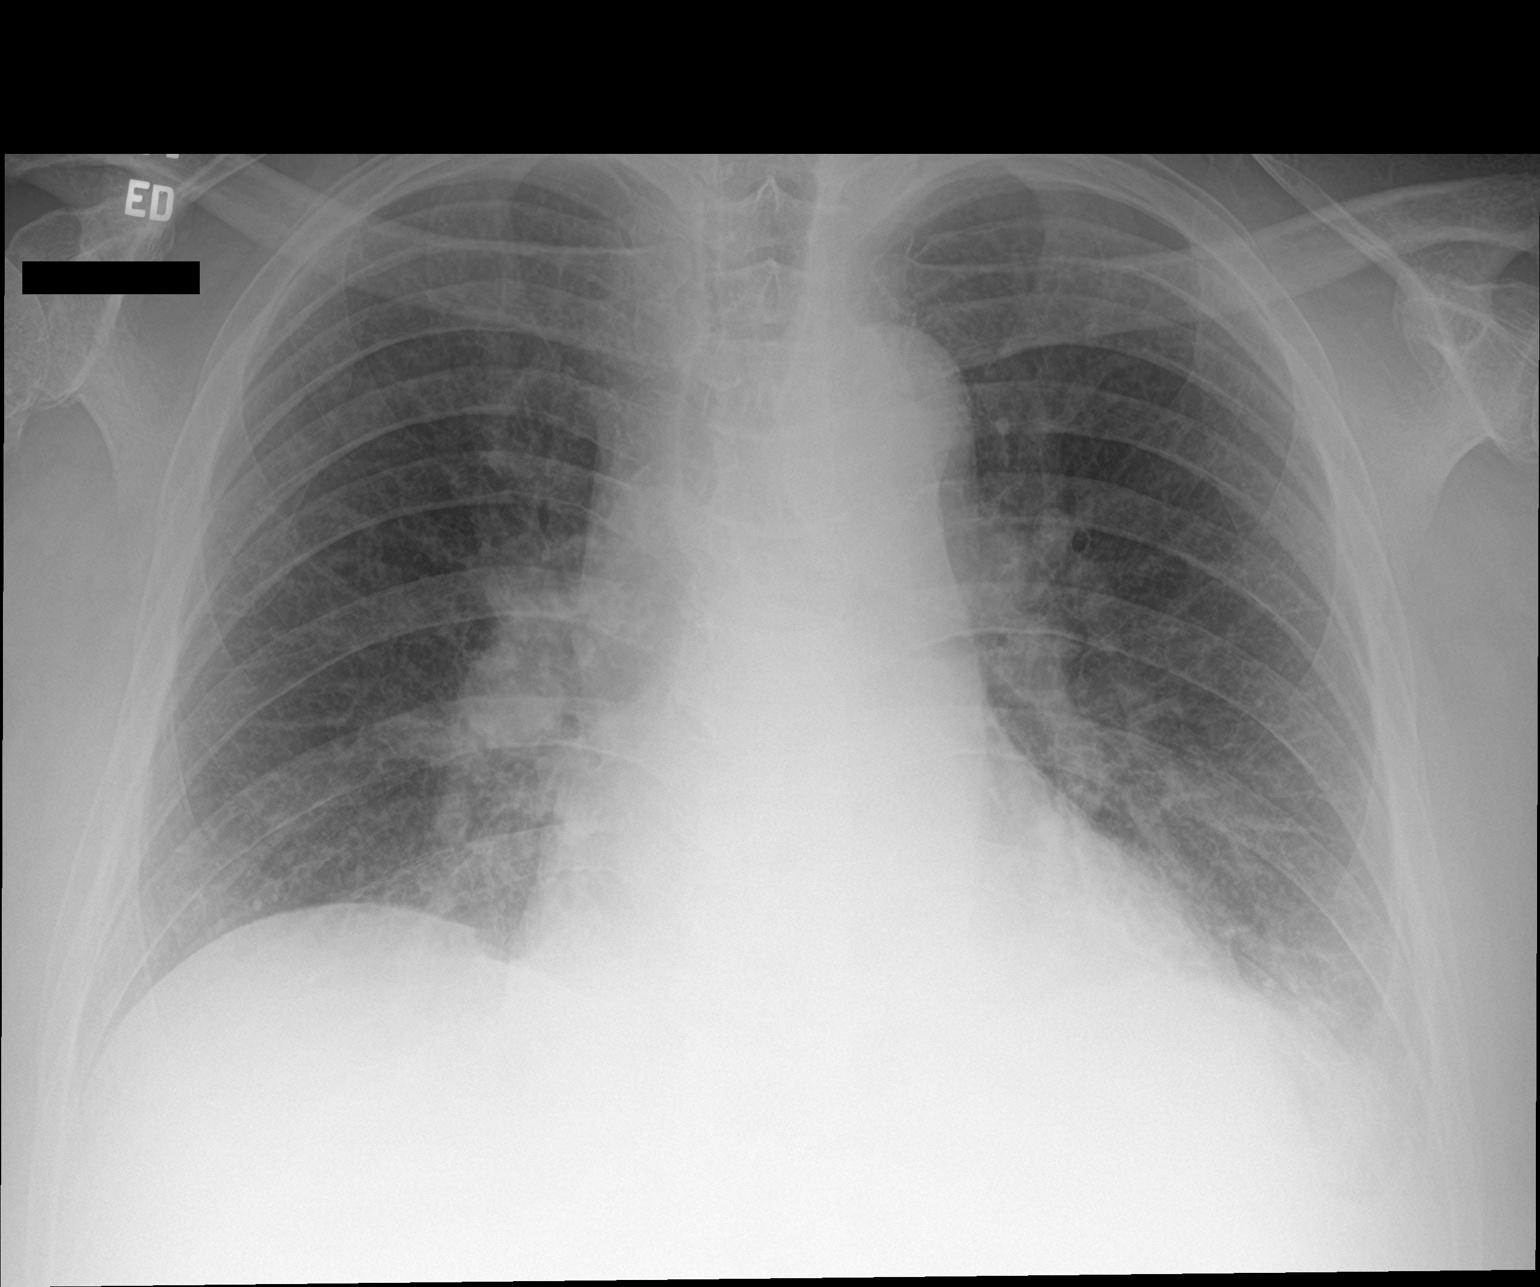

[2 of 2 positions shown; findings below may reference images not displayed]

FINDINGS: The cardio pericardial silhouette is enlarged. There is pulmonary
vascular congestion without overt pulmonary edema. No focal
consolidation or airspace pulmonary edema. Left base atelectasis or
infiltrate noted. Tiny bilateral pleural effusions evident. The
visualized bony structures of the thorax are intact.
IMPRESSION: Enlargement of the cardiopericardial silhouette with pulmonary
vascular congestion and tiny bilateral pleural effusions.

## 2021-02-07 ENCOUNTER — Telehealth: Payer: Self-pay

## 2021-02-07 NOTE — Telephone Encounter (Signed)
[facility] check in call made to patient and spouse, unable to leave message.

## 2021-02-11 ENCOUNTER — Telehealth: Payer: Self-pay

## 2021-02-11 ENCOUNTER — Other Ambulatory Visit: Payer: Self-pay

## 2021-02-11 MED ORDER — SPIRONOLACTONE 25 MG PO TABS: 12.5000 mg | ORAL_TABLET | Freq: Every day | ORAL | 3 refills | Status: DC

## 2021-02-11 MED ORDER — METFORMIN HCL ER 500 MG PO TB24: 500.0000 mg | ORAL_TABLET | Freq: Every day | ORAL | 3 refills | Status: DC

## 2021-02-11 NOTE — Telephone Encounter (Signed)
Patient needing refills on metformin and spironolactone send to [facility] [address] in [place] ph# [phone]

## 2021-02-11 NOTE — Telephone Encounter (Signed)
Rx sent 

## 2021-02-21 ENCOUNTER — Ambulatory Visit: Payer: Medicare HMO | Admitting: Family Medicine

## 2021-02-25 ENCOUNTER — Other Ambulatory Visit: Payer: Self-pay

## 2021-02-25 ENCOUNTER — Inpatient Hospital Stay (HOSPITAL_COMMUNITY)
Admission: EM | Admit: 2021-02-25 | Discharge: 2021-03-04 | DRG: 637 | Disposition: A | Discharge status: Skilled Nursing Facility | Payer: Medicare HMO | Attending: Family Medicine | Admitting: Family Medicine

## 2021-02-25 ENCOUNTER — Emergency Department (HOSPITAL_COMMUNITY): Payer: Medicare HMO

## 2021-02-25 ENCOUNTER — Encounter (HOSPITAL_COMMUNITY): Payer: Self-pay

## 2021-02-25 DIAGNOSIS — E113293 Type 2 diabetes mellitus with mild nonproliferative diabetic retinopathy without macular edema, bilateral: Secondary | ICD-10-CM | POA: Diagnosis not present

## 2021-02-25 DIAGNOSIS — F419 Anxiety disorder, unspecified: Secondary | ICD-10-CM | POA: Diagnosis present

## 2021-02-25 DIAGNOSIS — N1832 Chronic kidney disease, stage 3b: Secondary | ICD-10-CM | POA: Diagnosis present

## 2021-02-25 DIAGNOSIS — E039 Hypothyroidism, unspecified: Secondary | ICD-10-CM | POA: Diagnosis present

## 2021-02-25 DIAGNOSIS — R2689 Other abnormalities of gait and mobility: Secondary | ICD-10-CM | POA: Diagnosis not present

## 2021-02-25 DIAGNOSIS — Z79890 Hormone replacement therapy (postmenopausal): Secondary | ICD-10-CM

## 2021-02-25 DIAGNOSIS — I428 Other cardiomyopathies: Secondary | ICD-10-CM | POA: Diagnosis present

## 2021-02-25 DIAGNOSIS — Z7984 Long term (current) use of oral hypoglycemic drugs: Secondary | ICD-10-CM | POA: Diagnosis not present

## 2021-02-25 DIAGNOSIS — E875 Hyperkalemia: Secondary | ICD-10-CM | POA: Diagnosis present

## 2021-02-25 DIAGNOSIS — E1110 Type 2 diabetes mellitus with ketoacidosis without coma: Secondary | ICD-10-CM | POA: Diagnosis not present

## 2021-02-25 DIAGNOSIS — I501 Left ventricular failure: Secondary | ICD-10-CM | POA: Diagnosis not present

## 2021-02-25 DIAGNOSIS — E860 Dehydration: Secondary | ICD-10-CM | POA: Diagnosis present

## 2021-02-25 DIAGNOSIS — U071 COVID-19: Secondary | ICD-10-CM | POA: Diagnosis present

## 2021-02-25 DIAGNOSIS — T383X6A Underdosing of insulin and oral hypoglycemic [antidiabetic] drugs, initial encounter: Secondary | ICD-10-CM | POA: Diagnosis present

## 2021-02-25 DIAGNOSIS — D649 Anemia, unspecified: Secondary | ICD-10-CM | POA: Diagnosis not present

## 2021-02-25 DIAGNOSIS — Z794 Long term (current) use of insulin: Secondary | ICD-10-CM

## 2021-02-25 DIAGNOSIS — R8281 Pyuria: Secondary | ICD-10-CM | POA: Diagnosis present

## 2021-02-25 DIAGNOSIS — N179 Acute kidney failure, unspecified: Secondary | ICD-10-CM | POA: Diagnosis not present

## 2021-02-25 DIAGNOSIS — E871 Hypo-osmolality and hyponatremia: Secondary | ICD-10-CM | POA: Diagnosis not present

## 2021-02-25 DIAGNOSIS — Z7901 Long term (current) use of anticoagulants: Secondary | ICD-10-CM | POA: Diagnosis not present

## 2021-02-25 DIAGNOSIS — I5022 Chronic systolic (congestive) heart failure: Secondary | ICD-10-CM | POA: Diagnosis present

## 2021-02-25 DIAGNOSIS — I5042 Chronic combined systolic (congestive) and diastolic (congestive) heart failure: Secondary | ICD-10-CM | POA: Diagnosis not present

## 2021-02-25 DIAGNOSIS — R000 Tachycardia, unspecified: Secondary | ICD-10-CM | POA: Diagnosis not present

## 2021-02-25 DIAGNOSIS — E1165 Type 2 diabetes mellitus with hyperglycemia: Secondary | ICD-10-CM

## 2021-02-25 DIAGNOSIS — R739 Hyperglycemia, unspecified: Secondary | ICD-10-CM | POA: Diagnosis not present

## 2021-02-25 DIAGNOSIS — R0902 Hypoxemia: Secondary | ICD-10-CM | POA: Diagnosis not present

## 2021-02-25 DIAGNOSIS — E1122 Type 2 diabetes mellitus with diabetic chronic kidney disease: Secondary | ICD-10-CM | POA: Diagnosis present

## 2021-02-25 DIAGNOSIS — Y92009 Unspecified place in unspecified non-institutional (private) residence as the place of occurrence of the external cause: Secondary | ICD-10-CM

## 2021-02-25 DIAGNOSIS — Z043 Encounter for examination and observation following other accident: Secondary | ICD-10-CM | POA: Diagnosis not present

## 2021-02-25 DIAGNOSIS — Z8249 Family history of ischemic heart disease and other diseases of the circulatory system: Secondary | ICD-10-CM

## 2021-02-25 DIAGNOSIS — N400 Enlarged prostate without lower urinary tract symptoms: Secondary | ICD-10-CM | POA: Diagnosis present

## 2021-02-25 DIAGNOSIS — E785 Hyperlipidemia, unspecified: Secondary | ICD-10-CM | POA: Diagnosis not present

## 2021-02-25 DIAGNOSIS — Z79899 Other long term (current) drug therapy: Secondary | ICD-10-CM | POA: Diagnosis not present

## 2021-02-25 DIAGNOSIS — I447 Left bundle-branch block, unspecified: Secondary | ICD-10-CM | POA: Diagnosis present

## 2021-02-25 DIAGNOSIS — R748 Abnormal levels of other serum enzymes: Secondary | ICD-10-CM | POA: Diagnosis present

## 2021-02-25 DIAGNOSIS — S0990XA Unspecified injury of head, initial encounter: Secondary | ICD-10-CM | POA: Diagnosis not present

## 2021-02-25 DIAGNOSIS — I469 Cardiac arrest, cause unspecified: Secondary | ICD-10-CM | POA: Diagnosis not present

## 2021-02-25 LAB — COMPREHENSIVE METABOLIC PANEL
ALT: 9 U/L (ref 0–44)
AST: 13 U/L — ABNORMAL LOW (ref 15–41)
Albumin: 3.3 g/dL — ABNORMAL LOW (ref 3.5–5.0)
Alkaline Phosphatase: 102 U/L (ref 38–126)
Anion gap: 20 — ABNORMAL HIGH (ref 5–15)
BUN: 92 mg/dL — ABNORMAL HIGH (ref 8–23)
CO2: 17 mmol/L — ABNORMAL LOW (ref 22–32)
Calcium: 9.1 mg/dL (ref 8.9–10.3)
Chloride: 91 mmol/L — ABNORMAL LOW (ref 98–111)
Creatinine, Ser: 2.73 mg/dL — ABNORMAL HIGH (ref 0.61–1.24)
GFR, Estimated: 25 mL/min — ABNORMAL LOW (ref >60)
Glucose, Bld: 896 mg/dL (ref 70–99)
Potassium: 5.9 mmol/L — ABNORMAL HIGH (ref 3.5–5.1)
Sodium: 128 mmol/L — ABNORMAL LOW (ref 135–145)
Total Bilirubin: 1.1 mg/dL (ref 0.3–1.2)
Total Protein: 8.1 g/dL (ref 6.5–8.1)

## 2021-02-25 LAB — CBC WITH DIFFERENTIAL/PLATELET
Abs Immature Granulocytes: 0.39 10*3/uL — ABNORMAL HIGH (ref 0.00–0.07)
Basophils Absolute: 0.1 10*3/uL (ref 0.0–0.1)
Basophils Relative: 1 %
Eosinophils Absolute: 0 10*3/uL (ref 0.0–0.5)
Eosinophils Relative: 0 %
HCT: 41 % (ref 39.0–52.0)
Hemoglobin: 13.7 g/dL (ref 13.0–17.0)
Immature Granulocytes: 3 %
Lymphocytes Relative: 9 %
Lymphs Abs: 1.2 10*3/uL (ref 0.7–4.0)
MCH: 31.2 pg (ref 26.0–34.0)
MCHC: 33.4 g/dL (ref 30.0–36.0)
MCV: 93.4 fL (ref 80.0–100.0)
Monocytes Absolute: 0.5 10*3/uL (ref 0.1–1.0)
Monocytes Relative: 4 %
Neutro Abs: 11.2 10*3/uL — ABNORMAL HIGH (ref 1.7–7.7)
Neutrophils Relative %: 83 %
Platelets: 429 10*3/uL — ABNORMAL HIGH (ref 150–400)
RBC: 4.39 MIL/uL (ref 4.22–5.81)
RDW: 11.9 % (ref 11.5–15.5)
WBC: 13.3 10*3/uL — ABNORMAL HIGH (ref 4.0–10.5)
nRBC: 0 % (ref 0.0–0.2)

## 2021-02-25 LAB — RESP PANEL BY RT-PCR (FLU A&B, COVID) ARPGX2
Influenza A by PCR: NEGATIVE
Influenza B by PCR: NEGATIVE
SARS Coronavirus 2 by RT PCR: POSITIVE — AB

## 2021-02-25 LAB — BASIC METABOLIC PANEL
Anion gap: 19 — ABNORMAL HIGH (ref 5–15)
Anion gap: 12 (ref 5–15)
BUN: 90 mg/dL — ABNORMAL HIGH (ref 8–23)
BUN: 87 mg/dL — ABNORMAL HIGH (ref 8–23)
CO2: 14 mmol/L — ABNORMAL LOW (ref 22–32)
CO2: 19 mmol/L — ABNORMAL LOW (ref 22–32)
Calcium: 8.7 mg/dL — ABNORMAL LOW (ref 8.9–10.3)
Calcium: 9 mg/dL (ref 8.9–10.3)
Chloride: 95 mmol/L — ABNORMAL LOW (ref 98–111)
Chloride: 105 mmol/L (ref 98–111)
Creatinine, Ser: 2.65 mg/dL — ABNORMAL HIGH (ref 0.61–1.24)
Creatinine, Ser: 2.28 mg/dL — ABNORMAL HIGH (ref 0.61–1.24)
GFR, Estimated: 26 mL/min — ABNORMAL LOW (ref >60)
GFR, Estimated: 31 mL/min — ABNORMAL LOW (ref >60)
Glucose, Bld: 830 mg/dL (ref 70–99)
Glucose, Bld: 359 mg/dL — ABNORMAL HIGH (ref 70–99)
Potassium: 5.5 mmol/L — ABNORMAL HIGH (ref 3.5–5.1)
Potassium: 4.3 mmol/L (ref 3.5–5.1)
Sodium: 128 mmol/L — ABNORMAL LOW (ref 135–145)
Sodium: 136 mmol/L (ref 135–145)

## 2021-02-25 LAB — CBG MONITORING, ED
Glucose-Capillary: >600 mg/dL (ref 70–99)
Glucose-Capillary: >600 mg/dL (ref 70–99)
Glucose-Capillary: >600 mg/dL (ref 70–99)
Glucose-Capillary: >600 mg/dL (ref 70–99)
Glucose-Capillary: >600 mg/dL (ref 70–99)
Glucose-Capillary: >600 mg/dL (ref 70–99)
Glucose-Capillary: 439 mg/dL — ABNORMAL HIGH (ref 70–99)
Glucose-Capillary: 492 mg/dL — ABNORMAL HIGH (ref 70–99)
Glucose-Capillary: 513 mg/dL (ref 70–99)
Glucose-Capillary: 340 mg/dL — ABNORMAL HIGH (ref 70–99)

## 2021-02-25 LAB — CK: Total CK: 24 U/L — ABNORMAL LOW (ref 49–397)

## 2021-02-25 LAB — URINALYSIS, ROUTINE W REFLEX MICROSCOPIC
Bacteria, UA: NONE SEEN
Bilirubin Urine: NEGATIVE
Glucose, UA: >=500 mg/dL — AB
Ketones, ur: 5 mg/dL — AB
Nitrite: NEGATIVE
Protein, ur: 30 mg/dL — AB
Specific Gravity, Urine: 1.014 (ref 1.005–1.030)
WBC, UA: >50 WBC/hpf — ABNORMAL HIGH (ref 0–5)
pH: 5 (ref 5.0–8.0)

## 2021-02-25 LAB — BLOOD GAS, VENOUS
Acid-base deficit: 9.8 mmol/L — ABNORMAL HIGH (ref 0.0–2.0)
Bicarbonate: 15.6 mmol/L — ABNORMAL LOW (ref 20.0–28.0)
FIO2: 21
O2 Saturation: 48.3 %
Patient temperature: 36.7
pCO2, Ven: 38 mmHg — ABNORMAL LOW (ref 44.0–60.0)
pH, Ven: 7.248 — ABNORMAL LOW (ref 7.250–7.430)
pO2, Ven: 31.3 mmHg — CL (ref 32.0–45.0)

## 2021-02-25 LAB — RAPID URINE DRUG SCREEN, HOSP PERFORMED
Amphetamines: NOT DETECTED
Barbiturates: NOT DETECTED
Benzodiazepines: NOT DETECTED
Cocaine: NOT DETECTED
Opiates: NOT DETECTED
Tetrahydrocannabinol: NOT DETECTED

## 2021-02-25 LAB — LIPASE, BLOOD: Lipase: 280 U/L — ABNORMAL HIGH (ref 11–51)

## 2021-02-25 LAB — BETA-HYDROXYBUTYRIC ACID: Beta-Hydroxybutyric Acid: 6.44 mmol/L — ABNORMAL HIGH (ref 0.05–0.27)

## 2021-02-25 LAB — GLUCOSE, CAPILLARY
Glucose-Capillary: 325 mg/dL — ABNORMAL HIGH (ref 70–99)
Glucose-Capillary: 334 mg/dL — ABNORMAL HIGH (ref 70–99)

## 2021-02-25 LAB — LACTIC ACID, PLASMA
Lactic Acid, Venous: 2.5 mmol/L (ref 0.5–1.9)
Lactic Acid, Venous: 2.5 mmol/L (ref 0.5–1.9)

## 2021-02-25 MED ORDER — SODIUM CHLORIDE 0.9 % IV SOLN
1.0000 g | INTRAVENOUS | Status: DC
  Administered 2021-02-25 – 2021-02-26 (×2): 1 g via INTRAVENOUS
  Filled 2021-02-25 (×2): qty 10

## 2021-02-25 MED ORDER — INSULIN REGULAR(HUMAN) IN NACL 100-0.9 UT/100ML-% IV SOLN
INTRAVENOUS | Status: DC
Start: 2021-02-25 — End: 2021-02-27
  Administered 2021-02-25: 11.5 [IU]/h via INTRAVENOUS
  Administered 2021-02-26: 0.9 [IU]/h via INTRAVENOUS
  Filled 2021-02-25 (×2): qty 100

## 2021-02-25 MED ORDER — CHLORHEXIDINE GLUCONATE CLOTH 2 % EX PADS
6.0000 | MEDICATED_PAD | Freq: Every day | CUTANEOUS | Status: DC
  Administered 2021-02-26: 6 via TOPICAL

## 2021-02-25 MED ORDER — IMIPRAMINE HCL 25 MG PO TABS
50.0000 mg | ORAL_TABLET | Freq: Every day | ORAL | Status: DC
  Administered 2021-02-25: 50 mg via ORAL
  Filled 2021-02-25 (×2): qty 1

## 2021-02-25 MED ORDER — IMIPRAMINE HCL 25 MG PO TABS
ORAL_TABLET | ORAL | Status: AC
  Filled 2021-02-25: qty 2

## 2021-02-25 MED ORDER — GABAPENTIN 100 MG PO CAPS
100.0000 mg | ORAL_CAPSULE | Freq: Three times a day (TID) | ORAL | Status: DC
  Administered 2021-02-25 – 2021-03-04 (×21): 100 mg via ORAL
  Filled 2021-02-25 (×22): qty 1

## 2021-02-25 MED ORDER — SODIUM CHLORIDE 0.9 % IV BOLUS
500.0000 mL | Freq: Once | INTRAVENOUS | Status: AC
Start: 2021-02-25 — End: 2021-02-25
  Administered 2021-02-25: 500 mL via INTRAVENOUS

## 2021-02-25 MED ORDER — LACTATED RINGERS IV SOLN
INTRAVENOUS | Status: DC
Start: 2021-02-25 — End: 2021-02-27

## 2021-02-25 MED ORDER — METOPROLOL SUCCINATE ER 25 MG PO TB24
25.0000 mg | ORAL_TABLET | Freq: Every day | ORAL | Status: DC
  Administered 2021-02-25 – 2021-03-04 (×7): 25 mg via ORAL
  Filled 2021-02-25 (×8): qty 1

## 2021-02-25 MED ORDER — DIGOXIN 125 MCG PO TABS
0.1250 mg | ORAL_TABLET | Freq: Every day | ORAL | Status: DC
  Administered 2021-02-25 – 2021-03-04 (×8): 0.125 mg via ORAL
  Filled 2021-02-25 (×8): qty 1

## 2021-02-25 MED ORDER — LEVOTHYROXINE SODIUM 25 MCG PO TABS
25.0000 ug | ORAL_TABLET | Freq: Every morning | ORAL | Status: DC
  Administered 2021-02-26 – 2021-03-04 (×7): 25 ug via ORAL
  Filled 2021-02-25 (×7): qty 1

## 2021-02-25 MED ORDER — ONDANSETRON HCL 4 MG PO TABS: 4.0000 mg | ORAL_TABLET | Freq: Four times a day (QID) | ORAL | Status: DC | PRN

## 2021-02-25 MED ORDER — ROSUVASTATIN CALCIUM 20 MG PO TABS
20.0000 mg | ORAL_TABLET | Freq: Every day | ORAL | Status: DC
  Administered 2021-02-25 – 2021-03-04 (×8): 20 mg via ORAL
  Filled 2021-02-25 (×8): qty 1

## 2021-02-25 MED ORDER — ONDANSETRON HCL 4 MG/2ML IJ SOLN: 4.0000 mg | Freq: Four times a day (QID) | INTRAMUSCULAR | Status: DC | PRN

## 2021-02-25 MED ORDER — ESCITALOPRAM OXALATE 10 MG PO TABS
10.0000 mg | ORAL_TABLET | Freq: Every day | ORAL | Status: DC
  Administered 2021-02-26 – 2021-03-04 (×7): 10 mg via ORAL
  Filled 2021-02-25 (×7): qty 1

## 2021-02-25 MED ORDER — ACETAMINOPHEN 650 MG RE SUPP: 650.0000 mg | Freq: Four times a day (QID) | RECTAL | Status: DC | PRN

## 2021-02-25 MED ORDER — TAMSULOSIN HCL 0.4 MG PO CAPS
0.4000 mg | ORAL_CAPSULE | Freq: Every day | ORAL | Status: DC
  Administered 2021-02-25 – 2021-03-04 (×8): 0.4 mg via ORAL
  Filled 2021-02-25 (×9): qty 1

## 2021-02-25 MED ORDER — DEXTROSE 50 % IV SOLN: 0.0000 mL | INTRAVENOUS | Status: DC | PRN

## 2021-02-25 MED ORDER — ACETAMINOPHEN 325 MG PO TABS: 650.0000 mg | ORAL_TABLET | Freq: Four times a day (QID) | ORAL | Status: DC | PRN

## 2021-02-25 MED ORDER — SODIUM CHLORIDE 0.9 % IV BOLUS
1000.0000 mL | Freq: Once | INTRAVENOUS | Status: AC
  Administered 2021-02-25: 1000 mL via INTRAVENOUS

## 2021-02-25 MED ORDER — LEVOTHYROXINE SODIUM 50 MCG PO TABS: 50.0000 ug | ORAL_TABLET | Freq: Every morning | ORAL | Status: DC

## 2021-02-25 MED ORDER — APIXABAN 5 MG PO TABS
5.0000 mg | ORAL_TABLET | Freq: Two times a day (BID) | ORAL | Status: DC
  Administered 2021-02-25 – 2021-03-04 (×14): 5 mg via ORAL
  Filled 2021-02-25 (×14): qty 1

## 2021-02-25 MED ORDER — DEXTROSE IN LACTATED RINGERS 5 % IV SOLN
INTRAVENOUS | Status: DC
Start: 2021-02-25 — End: 2021-02-27

## 2021-02-25 MED ORDER — MIRABEGRON ER 25 MG PO TB24
25.0000 mg | ORAL_TABLET | Freq: Every day | ORAL | Status: DC
  Administered 2021-02-26 – 2021-03-04 (×7): 25 mg via ORAL
  Filled 2021-02-25 (×7): qty 1

## 2021-02-25 NOTE — H&P (Addendum)
History and Physical    CHER EGNOR CHE:[id number] DOB: 1955-11-21 DOA: 02/25/2021  PCP: Noreene Larsson, NP   Patient coming from: Home  Chief Complaint: Hyperglycemia  HPI: Stanley Levy is a 65 y.o. male with medical history significant for cardiomyopathy with LVEF 30%, mural thrombus on Eliquis, type 2 diabetes-poorly controlled, hypothyroidism, dyslipidemia, and BPH who was noted by EMS to have hyperglycemia and was brought to the ED for further evaluation.  Of note, patient is a poor historian.  He states that he has been unable to take his insulin as his wife generally administers this from him but unfortunately she was found to have passed away.  The patient's sister-in-law came to check in on them earlier today and discovered that her sister had expired and found the patient supposedly on the floor after having fallen overnight.  Patient states he could not get up from the floor.  The patient has felt more fatigued and generally weak and has had increased thirst.  He denies any nausea, vomiting, diarrhea, or urinary symptoms.  He denies any shortness of breath, fevers, chills, cough, or congestion.  He has had 2 prior COVID vaccinations.   ED Course: Vital signs demonstrate tachycardia and patient has multiple electrolyte abnormalities with glucose of 896, lactic acid 2.5, sodium 128, potassium 5.9, BUN 92, creatinine 2.73.  CT of the head was noted to be negative and chest x-ray negative for any acute abnormalities.  He is noted to be COVID-positive.  Lipase noted to be 280.  Urine analysis with signs of pyuria noted.  CK level of 24.  Review of Systems: Reviewed as noted above, otherwise negative.  Past Medical History:  Diagnosis Date   Anxiety    Chronic combined systolic and diastolic CHF (congestive heart failure) (HCC)    a. EF 20% with global HK by echo in 08/2019. Also noted to have LV mural thrombus.    Diabetes mellitus, type II ([place])    Foot ulcer ([place])    a. normal ABIs  in 2021.   History of noncompliance with medical treatment    Hyperlipidemia    Hypothyroidism    LV (left ventricular) mural thrombus    NICM (nonischemic cardiomyopathy) (HCC)    Nonproliferative diabetic retinopathy ([place])     Past Surgical History:  Procedure Laterality Date   FOOT SURGERY     RIGHT/LEFT HEART CATH AND CORONARY ANGIOGRAPHY N/A 09/20/2019   Procedure: RIGHT/LEFT HEART CATH AND CORONARY ANGIOGRAPHY;  Surgeon: Leonie Man, MD;  Location: [facility];  Service: Cardiovascular;  Laterality: N/A;     reports that he has never smoked. He has never used smokeless tobacco. He reports that he does not drink alcohol and does not use drugs.  No Known Allergies  Family History  Problem Relation Age of Onset   Heart attack Father        Near Age 80   Anxiety disorder Neg Hx    Dementia Neg Hx    Alcohol abuse Neg Hx    Drug abuse Neg Hx    ADD / ADHD Neg Hx    Bipolar disorder Neg Hx    Depression Neg Hx    OCD Neg Hx    Paranoid behavior Neg Hx    Physical abuse Neg Hx    Schizophrenia Neg Hx    Seizures Neg Hx    Sexual abuse Neg Hx     Prior to Admission medications   Medication Sig Start Date  End Date Taking? Authorizing Provider  ACCU-CHEK GUIDE test strip USE 4 TIMES A DAY AS DIRECTED 01/04/21   Noreene Larsson, NP  acetaminophen (TYLENOL) 325 MG tablet Take 2 tablets (650 mg total) by mouth every 4 (four) hours as needed for headache or mild pain. 08/23/19   Roxan Hockey, MD  apixaban (ELIQUIS) 5 MG TABS tablet Take 1 tablet (5 mg total) by mouth 2 (two) times daily. 11/06/20   Heath Lark D, DO  B-D UF III MINI PEN NEEDLES 31G X 5 MM MISC USE TWICE A DAY 01/31/21   Noreene Larsson, NP  blood glucose meter kit and supplies Dispense based on patient and insurance preference. Use four times daily as directed. (FOR ICD-10 E11.65). 12/11/20   Noreene Larsson, NP  dapagliflozin propanediol (FARXIGA) 10 MG TABS tablet Take 1 tablet (10 mg total) by mouth  daily before breakfast. 01/24/21   Satira Sark, MD  digoxin (LANOXIN) 0.125 MG tablet Take 1 tablet (0.125 mg total) by mouth daily. 11/06/20   Manuella Ghazi, Kynsleigh Westendorf D, DO  escitalopram (LEXAPRO) 10 MG tablet Take 1 tablet (10 mg total) by mouth daily. 11/06/20 11/06/21  Manuella Ghazi, Corinna Burkman D, DO  gabapentin (NEURONTIN) 100 MG capsule Take 1 capsule (100 mg total) by mouth 3 (three) times daily. 11/06/20 11/06/21  Manuella Ghazi, Phinley Schall D, DO  imipramine (TOFRANIL) 50 MG tablet TAKE 1 TABLET BY MOUTH EVERYDAY AT BEDTIME 11/27/20   Cloria Spring, MD  Lancets 30G MISC Check BG twice daily, once before breakfast and once before supper. 09/22/19   [facility], Tami Lin, PA  levothyroxine (SYNTHROID) 25 MCG tablet Take 1 tablet (25 mcg total) by mouth daily before breakfast. 11/06/20 01/11/21  Manuella Ghazi, Leanard Dimaio D, DO  levothyroxine (SYNTHROID) 50 MCG tablet Take 50 mcg by mouth every morning. 02/01/21   [provider]  metFORMIN (GLUCOPHAGE XR) 500 MG 24 hr tablet Take 1 tablet (500 mg total) by mouth daily with breakfast. 02/11/21   Noreene Larsson, NP  metoprolol succinate (TOPROL-XL) 25 MG 24 hr tablet Take 1 tablet (25 mg total) by mouth daily. 11/06/20   Manuella Ghazi, Deja Kaigler D, DO  mirabegron ER (MYRBETRIQ) 25 MG TB24 tablet Take 1 tablet (25 mg total) by mouth daily. 12/11/20   Noreene Larsson, NP  NOVOLOG MIX 70/30 FLEXPEN (70-30) 100 UNIT/ML FlexPen INJECT 24 UNITS TOTAL INTO THE SKIN 2 (TWO) TIMES DAILY WITH A MEAL. 01/21/21   Noreene Larsson, NP  OneTouch Delica Lancets 89Q MISC Four times daily testing  dx e11.65 09/08/19   Fayrene Helper, MD  potassium chloride SA (KLOR-CON) 20 MEQ tablet Take 2 tablets (40 mEq total) by mouth daily. 09/22/19   [facility], Tami Lin, PA  rosuvastatin (CRESTOR) 20 MG tablet Take 1 tablet (20 mg total) by mouth daily. 12/11/20   Noreene Larsson, NP  sacubitril-valsartan (ENTRESTO) 24-26 MG Take 1 tablet by mouth 2 (two) times daily. 11/06/20   Manuella Ghazi, Woodrow Dulski D, DO  spironolactone (ALDACTONE) 25 MG tablet  Take 0.5 tablets (12.5 mg total) by mouth daily. 02/11/21   Noreene Larsson, NP  tamsulosin (FLOMAX) 0.4 MG CAPS capsule Take 1 capsule (0.4 mg total) by mouth daily after supper. 11/06/20   Manuella Ghazi, Lariza Cothron D, DO  Vitamin D, Ergocalciferol, (DRISDOL) 1.25 MG (50000 UNIT) CAPS capsule Take 1 capsule (50,000 Units total) by mouth every 7 (seven) days. 08/12/19   Perlie Mayo, NP    Physical Exam: Vitals:   02/25/21 1530 02/25/21 1600 02/25/21  1700 02/25/21 1730  BP: (!) 141/60 129/72 134/87 (!) 132/115  Pulse: (!) 110 (!) 101 (!) 104 (!) 106  Resp: (!) 28 17 20 13   Temp:      TempSrc:      SpO2: 100% 100% 100% 100%  Weight:      Height:        Constitutional: NAD, calm, comfortable, appears to be somewhat confused and in a state of shock Vitals:   02/25/21 1530 02/25/21 1600 02/25/21 1700 02/25/21 1730  BP: (!) 141/60 129/72 134/87 (!) 132/115  Pulse: (!) 110 (!) 101 (!) 104 (!) 106  Resp: (!) 28 17 20 13   Temp:      TempSrc:      SpO2: 100% 100% 100% 100%  Weight:      Height:       Eyes: lids and conjunctivae normal Neck: normal, supple Respiratory: clear to auscultation bilaterally. Normal respiratory effort. No accessory muscle use.  Cardiovascular: Regular rate and rhythm, no murmurs. Abdomen: no tenderness, no distention. Bowel sounds positive.  Musculoskeletal:  No edema. Skin: no rashes, lesions, ulcers.  Psychiatric: Flat affect  Labs on Admission: I have personally reviewed following labs and imaging studies  CBC: Recent Labs  Lab 02/25/21 1516  WBC 13.3*  NEUTROABS 11.2*  HGB 13.7  HCT 41.0  MCV 93.4  PLT 897*   Basic Metabolic Panel: Recent Labs  Lab 02/25/21 1516 02/25/21 1656  NA 128* 128*  K 5.9* 5.5*  CL 91* 95*  CO2 17* 14*  GLUCOSE 896* 830*  BUN 92* 90*  CREATININE 2.73* 2.65*  CALCIUM 9.1 8.7*   GFR: Estimated Creatinine Clearance: 29.6 mL/min (A) (by C-G formula based on SCr of 2.65 mg/dL (H)). Liver Function Tests: Recent Labs  Lab  02/25/21 1516  AST 13*  ALT 9  ALKPHOS 102  BILITOT 1.1  PROT 8.1  ALBUMIN 3.3*   Recent Labs  Lab 02/25/21 1516  LIPASE 280*   No results for input(s): AMMONIA in the last 168 hours. Coagulation Profile: No results for input(s): INR, PROTIME in the last 168 hours. Cardiac Enzymes: Recent Labs  Lab 02/25/21 1615  CKTOTAL 24*   BNP (last 3 results) No results for input(s): PROBNP in the last 8760 hours. HbA1C: No results for input(s): HGBA1C in the last 72 hours. CBG: Recent Labs  Lab 02/25/21 1505 02/25/21 1650 02/25/21 1730  GLUCAP >600* >600* >600*   Lipid Profile: No results for input(s): CHOL, HDL, LDLCALC, TRIG, CHOLHDL, LDLDIRECT in the last 72 hours. Thyroid Function Tests: No results for input(s): TSH, T4TOTAL, FREET4, T3FREE, THYROIDAB in the last 72 hours. Anemia Panel: No results for input(s): VITAMINB12, FOLATE, FERRITIN, TIBC, IRON, RETICCTPCT in the last 72 hours. Urine analysis:    Component Value Date/Time   COLORURINE YELLOW 02/25/2021 1700   APPEARANCEUR TURBID (A) 02/25/2021 1700   LABSPEC 1.014 02/25/2021 1700   PHURINE 5.0 02/25/2021 1700   GLUCOSEU >=500 (A) 02/25/2021 1700   HGBUR MODERATE (A) 02/25/2021 1700   BILIRUBINUR NEGATIVE 02/25/2021 1700   KETONESUR 5 (A) 02/25/2021 1700   PROTEINUR 30 (A) 02/25/2021 1700   NITRITE NEGATIVE 02/25/2021 1700   LEUKOCYTESUR LARGE (A) 02/25/2021 1700    Radiological Exams on Admission: CT HEAD WO CONTRAST (5MM)  Result Date: 02/25/2021 CLINICAL DATA:  Fall EXAM: CT HEAD WITHOUT CONTRAST TECHNIQUE: Contiguous axial images were obtained from the base of the skull through the vertex without intravenous contrast. COMPARISON:  CT brain 11/03/2020 FINDINGS: Brain: No acute  territorial infarction, hemorrhage, or intracranial mass. Atrophy and chronic small vessel ischemic changes the white matter. Similar ex vacuo enlargement of left lateral ventricle with adjacent white matter encephalomalacia. Stable  ventricle size. Punctate left parietal cortical calcification as before. Vascular: No hyperdense vessels.  No unexpected calcification Skull: Normal. Negative for fracture or focal lesion. Sinuses/Orbits: No acute finding. Patchy mucosal thickening in the sinuses. Other: None IMPRESSION: 1. No CT evidence for acute intracranial abnormality. 2. Atrophy and chronic small vessel ischemic changes of the white matter Electronically Signed   By: Donavan Foil M.D.   On: 02/25/2021 16:52   DG Chest Port 1 View  Result Date: 02/25/2021 CLINICAL DATA:  Fall. EXAM: PORTABLE CHEST 1 VIEW COMPARISON:  Nov 03, 2020. FINDINGS: The heart size and mediastinal contours are within normal limits. Both lungs are clear. The visualized skeletal structures are unremarkable. IMPRESSION: No active disease. Electronically Signed   By: Marijo Conception M.D.   On: 02/25/2021 15:56    EKG: Independently reviewed. ST 109bpm with LBBB.  Assessment/Plan Active Problems:   DKA (diabetic ketoacidosis) ([place])    DKA -In poorly compliant type II diabetic -Continue IV fluid with gentle infusion given low EF -Continue insulin drip -Monitor routine labs -Keep n.p.o. -Previous hemoglobin A1c greater than 15.5% on 10/2020; recheck -Holding home medications of Farxiga and metformin -Consult to diabetes coordinator  Lactic acidosis -Likely secondary to dehydration from above -Continue to monitor  Hyponatremia secondary to above -Continue blood glucose correction with insulin and monitor  Hyperkalemia -Should resolve with insulin infusion -Continue to monitor  AKI-prerenal -In the setting of DKA with previous hemoglobin around 1.1 -Continue to trend with treatment of DKA  Pyuria -Patient is asymptomatic from standpoint of UTI -Urine culture will be sent for further evaluation -Mild leukocytosis noted and therefore patient will be started empirically on Rocephin  History of nonischemic cardiomyopathy -LVEF 30-35% -Hold  Entresto and Aldactone given AKI -Give gentle fluids -Monitor daily weights  Lipase elevation -No significant abdominal pain noted, avoid imaging for now  COVID infection -No significant signs or symptoms associated with this at the moment -Isolation precautions -No hypoxemia or infiltrates noted on chest x-ray  LV mural thrombus -Continue anticoagulation with Eliquis  Hypothyroidism -Continue levothyroxine  BPH -Continue tamsulosin  DVT prophylaxis: Eliquis Code Status: Full Family Communication: None at bedside Disposition Plan:Admit for treatment of DKA Consults called:None Admission status: Inpatient, SDU  Aoki Wedemeyer D Lauro Manlove DO [facility]  If 7PM-7AM, please contact night-coverage [email or web address]  02/25/2021, 5:48 PM

## 2021-02-25 NOTE — ED Triage Notes (Signed)
EMS called to house for patient's wife DOA and found patient on floor. States that he had been there approx. 15 minutes prior to their arrival. Reports fall as mechanical. Patient states that wife normally administers him his insulin and he has not had today. Glucometer reading HI. Denies pain.

## 2021-02-25 NOTE — ED Provider Notes (Signed)
[facility] EMERGENCY DEPARTMENT Provider Note   CSN: [id number] Arrival date & time: 02/25/21  1445     History Chief Complaint  Patient presents with   Hyperglycemia    Stanley Levy is a 65 y.o. male.  HPI  Patient with significant medical history of ischemic cardiomyopathy with an EF of 30%, type 2 diabetes, hypothyroidism, presents to the emergency department with chief complaint of an elevated blood sugar.  Patient states this morning he was unable to take his insulin today as his wife generally administers this to him but unfortunate she had passed away.  He states over the last 2 days he has felt more fatigued, states that he just feels weak all around, he denies recent head trauma, is not on anticoagulant, denies headaches, change in vision, paresthesias in the upper and/or lower extremities.  He admits to him  increased thirst and has been unable to quench it, he has no other complaints, denies nausea, vomiting, diarrhea,  urinary symptoms  He does note that he did have a mechanical fall earlier today, states he lost his balance and fell onto his right side, states he was unable to get up off the floor.  He denies neck pain, back pain, chest pain, abdominal pain, pain in his hips or upper and/or lower extremities.    Past Medical History:  Diagnosis Date   Anxiety    Chronic combined systolic and diastolic CHF (congestive heart failure) (HCC)    a. EF 20% with global HK by echo in 08/2019. Also noted to have LV mural thrombus.    Diabetes mellitus, type II ([place])    Foot ulcer ([place])    a. normal ABIs in 2021.   History of noncompliance with medical treatment    Hyperlipidemia    Hypothyroidism    LV (left ventricular) mural thrombus    NICM (nonischemic cardiomyopathy) ([place])    Nonproliferative diabetic retinopathy ([place])     Patient Active Problem List   Diagnosis Date Noted   DKA (diabetic ketoacidosis) ([place]) 02/25/2021   Anemia 12/11/2020   Nocturia 12/11/2020    Hypernatremia 41/93/7902   Acute metabolic encephalopathy 40/97/3532   Nonproliferative diabetic retinopathy ([place]) 12/27/2019   Pressure injury of skin 09/21/2019   At high risk for injury related to fall 09/08/2019   Overweight with body mass index (BMI) of 28 to 28.9 in adult 09/08/2019   Goals of care, counseling/discussion    Welcome to Medicare preventive visit    Hypothyroidism 08/18/2019   Cognitive changes/memory Challenges 08/18/2019   LV (left ventricular) mural thrombus    Severe left ventricular systolic dysfunction    Mixed hyperlipidemia    Memory loss 08/16/2019   Falls frequently 08/16/2019   Fatigue 08/16/2019   Essential hypertension, benign 08/16/2019   Elevated TSH 08/16/2019   Uncontrolled type 2 diabetes mellitus with hyperglycemia ([place]) 08/16/2019   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 % 99/24/2683   CHF (congestive heart failure) ([place]) 08/15/2019   Encounter for medication management 08/12/2019   Type 2 diabetes mellitus with hyperglycemia ([place]) 08/01/2019    Past Surgical History:  Procedure Laterality Date   FOOT SURGERY     RIGHT/LEFT HEART CATH AND CORONARY ANGIOGRAPHY N/A 09/20/2019   Procedure: RIGHT/LEFT HEART CATH AND CORONARY ANGIOGRAPHY;  Surgeon: Leonie Man, MD;  Location: [facility];  Service: Cardiovascular;  Laterality: N/A;       Family  History  Problem Relation Age of Onset   Heart attack Father        Near Age 54   Anxiety disorder Neg Hx    Dementia Neg Hx    Alcohol abuse Neg Hx    Drug abuse Neg Hx    ADD / ADHD Neg Hx    Bipolar disorder Neg Hx    Depression Neg Hx    OCD Neg Hx    Paranoid behavior Neg Hx    Physical abuse Neg Hx    Schizophrenia Neg Hx    Seizures Neg Hx    Sexual abuse Neg Hx     Social History   Tobacco Use   Smoking status: Never   Smokeless tobacco: Never  Vaping Use    Vaping Use: Never used  Substance Use Topics   Alcohol use: No   Drug use: No    Home Medications Prior to Admission medications   Medication Sig Start Date End Date Taking? Authorizing Provider  ACCU-CHEK GUIDE test strip USE 4 TIMES A DAY AS DIRECTED 01/04/21   Noreene Larsson, NP  acetaminophen (TYLENOL) 325 MG tablet Take 2 tablets (650 mg total) by mouth every 4 (four) hours as needed for headache or mild pain. 08/23/19   Roxan Hockey, MD  apixaban (ELIQUIS) 5 MG TABS tablet Take 1 tablet (5 mg total) by mouth 2 (two) times daily. 11/06/20   Heath Lark D, DO  B-D UF III MINI PEN NEEDLES 31G X 5 MM MISC USE TWICE A DAY 01/31/21   Noreene Larsson, NP  blood glucose meter kit and supplies Dispense based on patient and insurance preference. Use four times daily as directed. (FOR ICD-10 E11.65). 12/11/20   Noreene Larsson, NP  dapagliflozin propanediol (FARXIGA) 10 MG TABS tablet Take 1 tablet (10 mg total) by mouth daily before breakfast. 01/24/21   Satira Sark, MD  digoxin (LANOXIN) 0.125 MG tablet Take 1 tablet (0.125 mg total) by mouth daily. 11/06/20   Manuella Ghazi, Pratik D, DO  escitalopram (LEXAPRO) 10 MG tablet Take 1 tablet (10 mg total) by mouth daily. 11/06/20 11/06/21  Manuella Ghazi, Pratik D, DO  gabapentin (NEURONTIN) 100 MG capsule Take 1 capsule (100 mg total) by mouth 3 (three) times daily. 11/06/20 11/06/21  Manuella Ghazi, Pratik D, DO  imipramine (TOFRANIL) 50 MG tablet TAKE 1 TABLET BY MOUTH EVERYDAY AT BEDTIME 11/27/20   Cloria Spring, MD  Lancets 30G MISC Check BG twice daily, once before breakfast and once before supper. 09/22/19   Duke, Tami Lin, PA  levothyroxine (SYNTHROID) 25 MCG tablet Take 1 tablet (25 mcg total) by mouth daily before breakfast. 11/06/20 01/11/21  Manuella Ghazi, Pratik D, DO  levothyroxine (SYNTHROID) 50 MCG tablet Take 50 mcg by mouth every morning. 02/01/21   [provider]  metFORMIN (GLUCOPHAGE XR) 500 MG 24 hr tablet Take 1 tablet (500 mg total) by mouth daily with  breakfast. 02/11/21   Noreene Larsson, NP  metoprolol succinate (TOPROL-XL) 25 MG 24 hr tablet Take 1 tablet (25 mg total) by mouth daily. 11/06/20   Manuella Ghazi, Pratik D, DO  mirabegron ER (MYRBETRIQ) 25 MG TB24 tablet Take 1 tablet (25 mg total) by mouth daily. 12/11/20   Noreene Larsson, NP  NOVOLOG MIX 70/30 FLEXPEN (70-30) 100 UNIT/ML FlexPen INJECT 24 UNITS TOTAL INTO THE SKIN 2 (TWO) TIMES DAILY WITH A MEAL. 01/21/21   Noreene Larsson, NP  OneTouch Delica Lancets 78L MISC Four times daily testing  dx e11.65 09/08/19   Fayrene Helper, MD  potassium chloride SA (KLOR-CON) 20 MEQ tablet Take 2 tablets (40 mEq total) by mouth daily. 09/22/19   Duke, Tami Lin, PA  rosuvastatin (CRESTOR) 20 MG tablet Take 1 tablet (20 mg total) by mouth daily. 12/11/20   Noreene Larsson, NP  sacubitril-valsartan (ENTRESTO) 24-26 MG Take 1 tablet by mouth 2 (two) times daily. 11/06/20   Manuella Ghazi, Pratik D, DO  spironolactone (ALDACTONE) 25 MG tablet Take 0.5 tablets (12.5 mg total) by mouth daily. 02/11/21   Noreene Larsson, NP  tamsulosin (FLOMAX) 0.4 MG CAPS capsule Take 1 capsule (0.4 mg total) by mouth daily after supper. 11/06/20   Manuella Ghazi, Pratik D, DO  Vitamin D, Ergocalciferol, (DRISDOL) 1.25 MG (50000 UNIT) CAPS capsule Take 1 capsule (50,000 Units total) by mouth every 7 (seven) days. 08/12/19   Perlie Mayo, NP    Allergies    Patient has no known allergies.  Review of Systems   Review of Systems  Constitutional:  Positive for fatigue. Negative for chills and fever.  HENT:  Negative for congestion.   Respiratory:  Negative for cough and shortness of breath.   Cardiovascular:  Negative for chest pain.  Gastrointestinal:  Negative for abdominal pain, diarrhea, nausea and vomiting.  Genitourinary:  Negative for enuresis.  Musculoskeletal:  Negative for back pain.  Skin:  Negative for rash.  Neurological:  Negative for dizziness and headaches.  Hematological:  Does not bruise/bleed easily.   Physical  Exam Updated Vital Signs BP (!) 132/115   Pulse (!) 106   Temp 98.1 F (36.7 C) (Oral)   Resp 13   Ht _0  (1.727 m)   Wt 90.7 kg   SpO2 100%   BMI 30.41 kg/m   Physical Exam Vitals and nursing note reviewed.  Constitutional:      General: He is not in acute distress.    Appearance: He is not ill-appearing.  HENT:     Head: Normocephalic and atraumatic.     Comments: No gross deformities present of the head, no raccoon eyes or battle sign present.    Nose: No congestion.     Mouth/Throat:     Mouth: Mucous membranes are dry.     Pharynx: Oropharynx is clear. No oropharyngeal exudate or posterior oropharyngeal erythema.  Eyes:     Extraocular Movements: Extraocular movements intact.     Conjunctiva/sclera: Conjunctivae normal.     Pupils: Pupils are equal, round, and reactive to light.  Cardiovascular:     Rate and Rhythm: Normal rate and regular rhythm.     Pulses: Normal pulses.     Heart sounds: No murmur heard.   No friction rub. No gallop.  Pulmonary:     Effort: No respiratory distress.     Breath sounds: No wheezing, rhonchi or rales.  Abdominal:     Palpations: Abdomen is soft.     Tenderness: There is no abdominal tenderness. There is no right CVA tenderness or left CVA tenderness.  Musculoskeletal:     Comments: Patient has full range of motion, 5 of 5 strength the upper and lower extremities.  Skin:    General: Skin is warm and dry.  Neurological:     Mental Status: He is alert.     GCS: GCS eye subscore is 4. GCS verbal subscore is 5. GCS motor subscore is 6.     Cranial Nerves: No cranial nerve deficit.     Sensory: Sensation is intact.  Motor: No weakness.     Coordination: Romberg sign negative. Finger-Nose-Finger Test normal.     Comments: Cranial nerves II through XII grossly intact, patient having no difficulty word finding, not slurring his words, able to follow two-step commands, no unilateral weakness present.  Psychiatric:        Mood and  Affect: Mood normal.    ED Results / Procedures / Treatments   Labs (all labs ordered are listed, but only abnormal results are displayed) Labs Reviewed  RESP PANEL BY RT-PCR (FLU A&B, COVID) ARPGX2 - Abnormal; Notable for the following components:      Result Value   SARS Coronavirus 2 by RT PCR POSITIVE (*)    All other components within normal limits  COMPREHENSIVE METABOLIC PANEL - Abnormal; Notable for the following components:   Sodium 128 (*)    Potassium 5.9 (*)    Chloride 91 (*)    CO2 17 (*)    Glucose, Bld 896 (*)    BUN 92 (*)    Creatinine, Ser 2.73 (*)    Albumin 3.3 (*)    AST 13 (*)    GFR, Estimated 25 (*)    Anion gap 20 (*)    All other components within normal limits  LIPASE, BLOOD - Abnormal; Notable for the following components:   Lipase 280 (*)    All other components within normal limits  LACTIC ACID, PLASMA - Abnormal; Notable for the following components:   Lactic Acid, Venous 2.5 (*)    All other components within normal limits  CBC WITH DIFFERENTIAL/PLATELET - Abnormal; Notable for the following components:   WBC 13.3 (*)    Platelets 429 (*)    Neutro Abs 11.2 (*)    Abs Immature Granulocytes 0.39 (*)    All other components within normal limits  BLOOD GAS, VENOUS - Abnormal; Notable for the following components:   pH, Ven 7.248 (*)    pCO2, Ven 38.0 (*)    pO2, Ven 31.3 (*)    Bicarbonate 15.6 (*)    Acid-base deficit 9.8 (*)    All other components within normal limits  URINALYSIS, ROUTINE W REFLEX MICROSCOPIC - Abnormal; Notable for the following components:   APPearance TURBID (*)    Glucose, UA >=500 (*)    Hgb urine dipstick MODERATE (*)    Ketones, ur 5 (*)    Protein, ur 30 (*)    Leukocytes,Ua LARGE (*)    WBC, UA >50 (*)    All other components within normal limits  BASIC METABOLIC PANEL - Abnormal; Notable for the following components:   Sodium 128 (*)    Potassium 5.5 (*)    Chloride 95 (*)    CO2 14 (*)    Glucose,  Bld 830 (*)    BUN 90 (*)    Creatinine, Ser 2.65 (*)    Calcium 8.7 (*)    GFR, Estimated 26 (*)    Anion gap 19 (*)    All other components within normal limits  CK - Abnormal; Notable for the following components:   Total CK 24 (*)    All other components within normal limits  CBG MONITORING, ED - Abnormal; Notable for the following components:   Glucose-Capillary >600 (*)    All other components within normal limits  CBG MONITORING, ED - Abnormal; Notable for the following components:   Glucose-Capillary >600 (*)    All other components within normal limits  CBG MONITORING, ED - Abnormal; Notable  for the following components:   Glucose-Capillary >600 (*)    All other components within normal limits  CBG MONITORING, ED - Abnormal; Notable for the following components:   Glucose-Capillary >600 (*)    All other components within normal limits  CBG MONITORING, ED - Abnormal; Notable for the following components:   Glucose-Capillary >600 (*)    All other components within normal limits  URINE CULTURE  RAPID URINE DRUG SCREEN, HOSP PERFORMED  LACTIC ACID, PLASMA  BASIC METABOLIC PANEL  BASIC METABOLIC PANEL  BASIC METABOLIC PANEL  BETA-HYDROXYBUTYRIC ACID  BETA-HYDROXYBUTYRIC ACID  MAGNESIUM  CBC  HEMOGLOBIN A1C  LACTIC ACID, PLASMA  LIPASE, BLOOD    EKG EKG Interpretation  Date/Time:  Monday February 25 2021 15:09:24 EDT Ventricular Rate:  109 PR Interval:  160 QRS Duration: 140 QT Interval:  350 QTC Calculation: 472 R Axis:   7 Text Interpretation: Sinus tachycardia Left bundle branch block Confirmed by Lajean Saver [phone]) on 02/25/2021 4:23:40 PM  Radiology CT HEAD WO CONTRAST (5MM)  Result Date: 02/25/2021 CLINICAL DATA:  Fall EXAM: CT HEAD WITHOUT CONTRAST TECHNIQUE: Contiguous axial images were obtained from the base of the skull through the vertex without intravenous contrast. COMPARISON:  CT brain 11/03/2020 FINDINGS: Brain: No acute territorial infarction,  hemorrhage, or intracranial mass. Atrophy and chronic small vessel ischemic changes the white matter. Similar ex vacuo enlargement of left lateral ventricle with adjacent white matter encephalomalacia. Stable ventricle size. Punctate left parietal cortical calcification as before. Vascular: No hyperdense vessels.  No unexpected calcification Skull: Normal. Negative for fracture or focal lesion. Sinuses/Orbits: No acute finding. Patchy mucosal thickening in the sinuses. Other: None IMPRESSION: 1. No CT evidence for acute intracranial abnormality. 2. Atrophy and chronic small vessel ischemic changes of the white matter Electronically Signed   By: Donavan Foil M.D.   On: 02/25/2021 16:52   DG Chest Port 1 View  Result Date: 02/25/2021 CLINICAL DATA:  Fall. EXAM: PORTABLE CHEST 1 VIEW COMPARISON:  Nov 03, 2020. FINDINGS: The heart size and mediastinal contours are within normal limits. Both lungs are clear. The visualized skeletal structures are unremarkable. IMPRESSION: No active disease. Electronically Signed   By: Marijo Conception M.D.   On: 02/25/2021 15:56    Procedures .Critical Care  Date/Time: 02/25/2021 6:31 PM Performed by: Marcello Fennel, PA-C Authorized by: Marcello Fennel, PA-C   Critical care provider statement:    Critical care time (minutes):  45   Critical care was necessary to treat or prevent imminent or life-threatening deterioration of the following conditions:  Metabolic crisis   Critical care was time spent personally by me on the following activities:  Discussions with consultants, evaluation of patient's response to treatment, examination of patient, ordering and performing treatments and interventions, ordering and review of laboratory studies, ordering and review of radiographic studies, pulse oximetry, re-evaluation of patient's condition and review of old charts   I assumed direction of critical care for this patient from another provider in my specialty: no     Care  discussed with: admitting provider     Medications Ordered in ED Medications  insulin regular, human (MYXREDLIN) 100 units/ 100 mL infusion (11.5 Units/hr Intravenous New Bag/Given 02/25/21 1658)  lactated ringers infusion (has no administration in time range)  dextrose 5 % in lactated ringers infusion (has no administration in time range)  dextrose 50 % solution 0-50 mL (has no administration in time range)  digoxin (LANOXIN) tablet 0.125 mg (has no administration in  time range)  metoprolol succinate (TOPROL-XL) 24 hr tablet 25 mg (has no administration in time range)  rosuvastatin (CRESTOR) tablet 20 mg (has no administration in time range)  escitalopram (LEXAPRO) tablet 10 mg (has no administration in time range)  imipramine (TOFRANIL) tablet 50 mg (has no administration in time range)  mirabegron ER (MYRBETRIQ) tablet 25 mg (has no administration in time range)  tamsulosin (FLOMAX) capsule 0.4 mg (has no administration in time range)  apixaban (ELIQUIS) tablet 5 mg (has no administration in time range)  gabapentin (NEURONTIN) capsule 100 mg (has no administration in time range)  acetaminophen (TYLENOL) tablet 650 mg (has no administration in time range)    Or  acetaminophen (TYLENOL) suppository 650 mg (has no administration in time range)  ondansetron (ZOFRAN) tablet 4 mg (has no administration in time range)    Or  ondansetron (ZOFRAN) injection 4 mg (has no administration in time range)  cefTRIAXone (ROCEPHIN) 1 g in sodium chloride 0.9 % 100 mL IVPB (has no administration in time range)  levothyroxine (SYNTHROID) tablet 25 mcg (has no administration in time range)  sodium chloride 0.9 % bolus 1,000 mL (0 mLs Intravenous Stopped 02/25/21 1702)  sodium chloride 0.9 % bolus 500 mL (500 mLs Intravenous New Bag/Given 02/25/21 1655)    ED Course  I have reviewed the triage vital signs and the nursing notes.  Pertinent labs & imaging results that were available during my care of the  patient were reviewed by me and considered in my medical decision making (see chart for details).    MDM Rules/Calculators/A&P                          Initial impression-patient presents with elevated glucose, he is alert, does not appear acute stress, vital signs are for tachycardia.  Suspect DKA as he appears to be very dry on my exam, will obtain basic lab work-up and reassess.  Work-up-CBC shows slight leukocytosis of 13.5, CMP shows slight hyponatremia of 128, hyperkalemia 5.9, CO2 of 17, glucose 896, creatinine 2.73, anion gap of 20.  Lipase is 280, VBG reveals a pH of 7.2, with a CO2 of 38, he is COVID-positive, lactic 2.5, chest x-ray unremarkable, CT head negative for acute findings, EKG sinus tach without signs of ischemia.  Reassessment-lab work consistent with DKA, will activate DKA protocol, provide him with fluids, gentle resuscitation as he has an EF of 30%.  CT head cki will monitor for acute findings.  Will recommend admission due to DKA patient is agreement this plan.  Will consult hospitalist.  Consult-spoke with Dr. Manuella Ghazi he will admit the patient.  Rule out-low suspicion for systemic infection as patient is nontoxic-appearing, vital signs reassuring.  He does have slight leukocytosis lactic 2.5 and was tachycardic but I suspect this is all secondary due to dehydration as he was extremely dry my exam, elevated creatinine.  I have low suspicion for complicated pancreatitis as patient has no epigastric tenderness, he denies nausea vomiting diarrhea, this would be  consistent with a lipase of 280.  Will defer imaging at this time.  I have low suspicion for rhabdo as CK is within normal limits.  I have low suspicion for intracranial head bleed, mass, CVA there is no neurodeficits present on exam, CT imaging is negative for acute findings.  Low session for UTI, kidney stone, [place] patient denies any urinary symptoms, he has no CVA tenderness, UA is negative for nitrates or bacteria.  Tracie Harrier is an increased amount of white blood cells and large leukocytes could be secondary due to DKA, dehydration, will defer antibiotic treatment this time, will obtain cultures for further evaluation.  Suspicion patient will require emergent hemodialysis as he is not volume overloaded, no EKG changes present.  Likely elevated potassium is from DKA exacerbated by AKI from dehydration.  Plan admit to medicine due to DKA.  Final Clinical Impression(s) / ED Diagnoses Final diagnoses:  Diabetic ketoacidosis without coma associated with type 2 diabetes mellitus ([place])  AKI (acute kidney injury) [facility])    Rx / DC Orders ED Discharge Orders     None        Marcello Fennel, PA-C 02/25/21 1831    Lajean Saver, MD 02/26/21 1118

## 2021-02-26 LAB — BASIC METABOLIC PANEL
Anion gap: 9 (ref 5–15)
Anion gap: 10 (ref 5–15)
Anion gap: 8 (ref 5–15)
BUN: 85 mg/dL — ABNORMAL HIGH (ref 8–23)
BUN: 85 mg/dL — ABNORMAL HIGH (ref 8–23)
BUN: 85 mg/dL — ABNORMAL HIGH (ref 8–23)
CO2: 23 mmol/L (ref 22–32)
CO2: 22 mmol/L (ref 22–32)
CO2: 23 mmol/L (ref 22–32)
Calcium: 9 mg/dL (ref 8.9–10.3)
Calcium: 9.1 mg/dL (ref 8.9–10.3)
Calcium: 8.7 mg/dL — ABNORMAL LOW (ref 8.9–10.3)
Chloride: 106 mmol/L (ref 98–111)
Chloride: 106 mmol/L (ref 98–111)
Chloride: 108 mmol/L (ref 98–111)
Creatinine, Ser: 2.34 mg/dL — ABNORMAL HIGH (ref 0.61–1.24)
Creatinine, Ser: 2.31 mg/dL — ABNORMAL HIGH (ref 0.61–1.24)
Creatinine, Ser: 2.41 mg/dL — ABNORMAL HIGH (ref 0.61–1.24)
GFR, Estimated: 30 mL/min — ABNORMAL LOW (ref >60)
GFR, Estimated: 30 mL/min — ABNORMAL LOW (ref >60)
GFR, Estimated: 29 mL/min — ABNORMAL LOW (ref >60)
Glucose, Bld: 224 mg/dL — ABNORMAL HIGH (ref 70–99)
Glucose, Bld: 229 mg/dL — ABNORMAL HIGH (ref 70–99)
Glucose, Bld: 139 mg/dL — ABNORMAL HIGH (ref 70–99)
Potassium: 4 mmol/L (ref 3.5–5.1)
Potassium: 4.1 mmol/L (ref 3.5–5.1)
Potassium: 3.7 mmol/L (ref 3.5–5.1)
Sodium: 138 mmol/L (ref 135–145)
Sodium: 138 mmol/L (ref 135–145)
Sodium: 139 mmol/L (ref 135–145)

## 2021-02-26 LAB — GLUCOSE, CAPILLARY
Glucose-Capillary: 127 mg/dL — ABNORMAL HIGH (ref 70–99)
Glucose-Capillary: 173 mg/dL — ABNORMAL HIGH (ref 70–99)
Glucose-Capillary: 175 mg/dL — ABNORMAL HIGH (ref 70–99)
Glucose-Capillary: 204 mg/dL — ABNORMAL HIGH (ref 70–99)
Glucose-Capillary: 220 mg/dL — ABNORMAL HIGH (ref 70–99)
Glucose-Capillary: 223 mg/dL — ABNORMAL HIGH (ref 70–99)
Glucose-Capillary: 223 mg/dL — ABNORMAL HIGH (ref 70–99)
Glucose-Capillary: 224 mg/dL — ABNORMAL HIGH (ref 70–99)
Glucose-Capillary: 245 mg/dL — ABNORMAL HIGH (ref 70–99)
Glucose-Capillary: 263 mg/dL — ABNORMAL HIGH (ref 70–99)
Glucose-Capillary: 263 mg/dL — ABNORMAL HIGH (ref 70–99)
Glucose-Capillary: 290 mg/dL — ABNORMAL HIGH (ref 70–99)

## 2021-02-26 LAB — CBC
HCT: 30.1 % — ABNORMAL LOW (ref 39.0–52.0)
Hemoglobin: 10.2 g/dL — ABNORMAL LOW (ref 13.0–17.0)
MCH: 31.4 pg (ref 26.0–34.0)
MCHC: 33.9 g/dL (ref 30.0–36.0)
MCV: 92.6 fL (ref 80.0–100.0)
Platelets: 379 10*3/uL (ref 150–400)
RBC: 3.25 MIL/uL — ABNORMAL LOW (ref 4.22–5.81)
RDW: 11.9 % (ref 11.5–15.5)
WBC: 15.9 10*3/uL — ABNORMAL HIGH (ref 4.0–10.5)
nRBC: 0 % (ref 0.0–0.2)

## 2021-02-26 LAB — BETA-HYDROXYBUTYRIC ACID
Beta-Hydroxybutyric Acid: 0.16 mmol/L (ref 0.05–0.27)
Beta-Hydroxybutyric Acid: 0.07 mmol/L (ref 0.05–0.27)

## 2021-02-26 LAB — MRSA NEXT GEN BY PCR, NASAL: MRSA by PCR Next Gen: NOT DETECTED

## 2021-02-26 LAB — MAGNESIUM: Magnesium: 2.6 mg/dL — ABNORMAL HIGH (ref 1.7–2.4)

## 2021-02-26 LAB — LIPASE, BLOOD: Lipase: 125 U/L — ABNORMAL HIGH (ref 11–51)

## 2021-02-26 LAB — LACTIC ACID, PLASMA: Lactic Acid, Venous: 1.4 mmol/L (ref 0.5–1.9)

## 2021-02-26 MED ORDER — INSULIN ASPART 100 UNIT/ML IJ SOLN
0.0000 [IU] | INTRAMUSCULAR | Status: DC
  Administered 2021-02-26: 2 [IU] via SUBCUTANEOUS
  Administered 2021-02-26: 3 [IU] via SUBCUTANEOUS
  Administered 2021-02-26 – 2021-02-27 (×3): 5 [IU] via SUBCUTANEOUS

## 2021-02-26 MED ORDER — INSULIN GLARGINE-YFGN 100 UNIT/ML ~~LOC~~ SOLN
7.0000 [IU] | Freq: Once | SUBCUTANEOUS | Status: AC
  Administered 2021-02-26: 7 [IU] via SUBCUTANEOUS
  Filled 2021-02-26: qty 0.07

## 2021-02-26 NOTE — Plan of Care (Signed)
  Problem: Acute Rehab PT Goals(only PT should resolve) Goal: Pt Will Go Supine/Side To Sit Outcome: Progressing Flowsheets (Taken 02/26/2021 1245) Pt will go Supine/Side to Sit: with modified independence Goal: Pt Will Go Sit To Supine/Side Outcome: Progressing Flowsheets (Taken 02/26/2021 1245) Pt will go Sit to Supine/Side: with modified independence Goal: Patient Will Transfer Sit To/From Stand Outcome: Progressing Flowsheets (Taken 02/26/2021 1245) Patient will transfer sit to/from stand: with supervision Goal: Pt Will Transfer Bed To Chair/Chair To Bed Outcome: Progressing Flowsheets (Taken 02/26/2021 1245) Pt will Transfer Bed to Chair/Chair to Bed: with supervision Goal: Pt Will Ambulate Outcome: Progressing Flowsheets (Taken 02/26/2021 1245) Pt will Ambulate:  > 125 feet  with supervision  with least restrictive assistive device   Tori Yittel Emrich PT, DPT 02/26/21, 12:45 PM

## 2021-02-26 NOTE — Plan of Care (Signed)
  Problem: Education: Goal: Knowledge of General Education information will improve Description: Including pain rating scale, medication(s)/side effects and non-pharmacologic comfort measures 02/26/2021 2226 by Corrie Mckusick, RN Outcome: Progressing 02/26/2021 2226 by Corrie Mckusick, RN Outcome: Progressing   Problem: Health Behavior/Discharge Planning: Goal: Ability to manage health-related needs will improve 02/26/2021 2226 by Corrie Mckusick, RN Outcome: Progressing 02/26/2021 2226 by Corrie Mckusick, RN Outcome: Progressing   Problem: Clinical Measurements: Goal: Ability to maintain clinical measurements within normal limits will improve 02/26/2021 2226 by Corrie Mckusick, RN Outcome: Progressing 02/26/2021 2226 by Corrie Mckusick, RN Outcome: Progressing Goal: Will remain free from infection 02/26/2021 2226 by Corrie Mckusick, RN Outcome: Progressing 02/26/2021 2226 by Corrie Mckusick, RN Outcome: Progressing Goal: Diagnostic test results will improve 02/26/2021 2226 by Corrie Mckusick, RN Outcome: Progressing 02/26/2021 2226 by Corrie Mckusick, RN Outcome: Progressing Goal: Respiratory complications will improve Outcome: Progressing Goal: Cardiovascular complication will be avoided Outcome: Progressing   Problem: Activity: Goal: Risk for activity intolerance will decrease Outcome: Progressing   Problem: Nutrition: Goal: Adequate nutrition will be maintained Outcome: Progressing   Problem: Coping: Goal: Level of anxiety will decrease Outcome: Progressing   Problem: Elimination: Goal: Will not experience complications related to bowel motility Outcome: Progressing Goal: Will not experience complications related to urinary retention Outcome: Progressing   Problem: Pain Managment: Goal: General experience of comfort will improve Outcome: Progressing   Problem: Safety: Goal: Ability to remain free from injury will improve Outcome:  Progressing   Problem: Skin Integrity: Goal: Risk for impaired skin integrity will decrease Outcome: Progressing

## 2021-02-26 NOTE — Evaluation (Addendum)
Physical Therapy Evaluation Patient Details Name: Stanley Levy MRN: [id number] DOB: 1956/04/23 Today's Date: 02/26/2021   History of Present Illness  Stanley Levy is a 65 y.o. male who was noted by EMS to have hyperglycemia; pt positive for covid19. No CT evidence for acute intracranial abnormality. PMH: cardiomyopathy with LVEF 30%, mural thrombus on Eliquis, type 2 diabetes, hypothyroidism, dyslipidemia, and BPH   Clinical Impression  Pt admitted with above diagnosis. Pt flat throughout PT eval, able to confirm information in chart, states his wife died day before last, appears spacey and looks off into distance without focus on specific object, doesn't appear sad or happy. Pt requiring assistance and cues with mobility, maintains posterior weight in standing with BLE braced against bed, provided RW to encourage anterior weight shift and provide support without improvement in posterior weight. Pt able to perform sidesteps up to [facility] with minor LOB requiring min A to recover. Pt's BP 104/60 in sitting without complaints, reports "some" dizziness in standing but unable to tolerate standing for 3 minutes. Unsure of assistance now available at home, currently recommending 24 hr support pending progress and assistance at home. Pt currently with functional limitations due to the deficits listed below (see PT Problem List). Pt will benefit from skilled PT to increase their independence and safety with mobility to allow discharge to the venue listed below.       Follow Up Recommendations Supervision/Assistance - 24 hour (SNF vs HHPT pending progress with PT, assistance at home)    Equipment Recommendations  None recommended by PT    Recommendations for Other Services       Precautions / Restrictions Precautions Precautions: Fall Precaution Comments: monitor BP Restrictions Weight Bearing Restrictions: No      Mobility  Bed Mobility Overal bed mobility: Needs Assistance Bed Mobility:  Supine to Sit;Sit to Supine  Supine to sit: Min guard Sit to supine: Min guard   General bed mobility comments: VCs for sequencing to come to sitting EOB, increased time and effort; VCs to return to supine to avoid reclining straight back into bedrail    Transfers Overall transfer level: Needs assistance Equipment used: Rolling walker (2 wheeled);None Transfers: Sit to/from Stand Sit to Stand: Min assist;Min guard  General transfer comment: min A-min guard to steady while rising, no assist to power up, maintains weight posterior with BLE braced against bed and toes off of floor, VCs for performing with RW and without without improvement despite AD  Ambulation/Gait Ambulation/Gait assistance: Min guard;Min assist  Assistive device: Rolling walker (2 wheeled);None Gait Pattern/deviations: Step-to pattern;Decreased stride length Gait velocity: decreased   General Gait Details: pt takes 3 sidesteps up to [facility] with weight posterior, provided RW to encourage forward trunk lean/support without improvement, able to clear each foot with sidestep, minor LOB posterior requiring assistance to recover  Stairs            Wheelchair Mobility    Modified Rankin (Stroke Patients Only)       Balance Overall balance assessment: Needs assistance Sitting-balance support: Feet supported Sitting balance-Leahy Scale: Good Sitting balance - Comments: seated EOB   Standing balance support: During functional activity Standing balance-Leahy Scale: Poor Standing balance comment: posterior lean, braced LE on bed or UE support on RW       Pertinent Vitals/Pain Pain Assessment: No/denies pain    Home Living Family/patient expects to be discharged to:: Private residence Living Arrangements: Alone (spouse recently passed) Available Help at Discharge: Family;Available 24 hours/day Type of Home:  House Home Access: Stairs to enter Entrance Stairs-Rails: None Entrance Stairs-Number of Steps:  3 Home Layout: One level Home Equipment: Cane - single point Additional Comments: home set up in chart and confirmed by pt, pt's spouse passed away Mar 25, 2023 per pt and he was brought to the hospital so he hasn't lived alone    Prior Function Level of Independence: Independent         Comments: Pt reports independent, drives, denies falls     Hand Dominance        Extremity/Trunk Assessment   Upper Extremity Assessment Upper Extremity Assessment: Overall WFL for tasks assessed    Lower Extremity Assessment Lower Extremity Assessment: Generalized weakness (AROM WNL, strength grossly 3+/5, denies numbness/tingling)    Cervical / Trunk Assessment Cervical / Trunk Assessment: Normal  Communication   Communication: No difficulties  Cognition Arousal/Alertness: Awake/alert Behavior During Therapy: Flat affect Overall Cognitive Status: No family/caregiver present to determine baseline cognitive functioning  General Comments: No family present, pt flat and appears to daze off at a distance without focusing on specific object, able to redirect, slow to respond to cues, unaware of date, states age is "34", able to verbalize that his wife died "the day before last"      General Comments      Exercises     Assessment/Plan    PT Assessment Patient needs continued PT services  PT Problem List Decreased strength;Decreased activity tolerance;Decreased balance;Decreased cognition;Decreased knowledge of use of DME;Decreased safety awareness       PT Treatment Interventions DME instruction;Gait training;Functional mobility training;Therapeutic activities;Therapeutic exercise;Balance training;Cognitive remediation;Patient/family education    PT Goals (Current goals can be found in the Care Plan section)  Acute Rehab PT Goals Patient Stated Goal: none stated PT Goal Formulation: With patient Time For Goal Achievement: 03/12/21 Potential to Achieve Goals: Good    Frequency Min  3X/week   Barriers to discharge        Co-evaluation               AM-PAC PT "6 Clicks" Mobility  Outcome Measure Help needed turning from your back to your side while in a flat bed without using bedrails?: A Little Help needed moving from lying on your back to sitting on the side of a flat bed without using bedrails?: A Little Help needed moving to and from a bed to a chair (including a wheelchair)?: A Little Help needed standing up from a chair using your arms (e.g., wheelchair or bedside chair)?: A Little Help needed to walk in hospital room?: A Little Help needed climbing 3-5 steps with a railing? : A Lot 6 Click Score: 17    End of Session Equipment Utilized During Treatment: Gait belt Activity Tolerance: Patient tolerated treatment well Patient left: in bed;with call bell/phone within reach Nurse Communication: Mobility status;Other (comment) (BP, IV beeping) PT Visit Diagnosis: Unsteadiness on feet (R26.81);Other abnormalities of gait and mobility (R26.89);Muscle weakness (generalized) (M62.81)    Time: 3810-1751 PT Time Calculation (min) (ACUTE ONLY): 11 min   Charges:   PT Evaluation $PT Eval Moderate Complexity: 1 Mod           Tori Kahliya Fraleigh PT, DPT 02/26/21, 12:42 PM

## 2021-02-26 NOTE — Plan of Care (Signed)
Care plan Reviewed

## 2021-02-26 NOTE — Discharge Instructions (Addendum)
Please take DM medications as prescribed at discharge and keep appointment with [facility] Endocrinology on March 05, 2021 at 11:00am.  Please be sure to take glucometer or log book of glucose readings to all appointments. Providers need the information to help make adjustments with DM medications if needed.   IMPORTANT INFORMATION: PAY CLOSE ATTENTION   PHYSICIAN DISCHARGE INSTRUCTIONS  Follow with Primary care provider  Heather Roberts, NP  and other consultants as instructed by your Hospitalist Physician  SEEK MEDICAL CARE OR RETURN TO EMERGENCY ROOM IF SYMPTOMS COME BACK, WORSEN OR NEW PROBLEM DEVELOPS   Please note: You were cared for by a hospitalist during your hospital stay. Every effort will be made to forward records to your primary care provider.  You can request that your primary care provider send for your hospital records if they have not received them.  Once you are discharged, your primary care physician will handle any further medical issues. Please note that NO REFILLS for any discharge medications will be authorized once you are discharged, as it is imperative that you return to your primary care physician (or establish a relationship with a primary care physician if you do not have one) for your post hospital discharge needs so that they can reassess your need for medications and monitor your lab values.  Please get a complete blood count and chemistry panel checked by your Primary MD at your next visit, and again as instructed by your Primary MD.  Get Medicines reviewed and adjusted: Please take all your medications with you for your next visit with your Primary MD  Laboratory/radiological data: Please request your Primary MD to go over all hospital tests and procedure/radiological results at the follow up, please ask your primary care provider to get all Hospital records sent to his/her office.  In some cases, they will be blood work, cultures and biopsy results  pending at the time of your discharge. Please request that your primary care provider follow up on these results.  If you are diabetic, please bring your blood sugar readings with you to your follow up appointment with primary care.    Please call and make your follow up appointments as soon as possible.    Also Note the following: If you experience worsening of your admission symptoms, develop shortness of breath, life threatening emergency, suicidal or homicidal thoughts you must seek medical attention immediately by calling 911 or calling your MD immediately  if symptoms less severe.  You must read complete instructions/literature along with all the possible adverse reactions/side effects for all the Medicines you take and that have been prescribed to you. Take any new Medicines after you have completely understood and accpet all the possible adverse reactions/side effects.   Do not drive when taking Pain medications or sleeping medications (Benzodiazepines)  Do not take more than prescribed Pain, Sleep and Anxiety Medications. It is not advisable to combine anxiety,sleep and pain medications without talking with your primary care practitioner  Special Instructions: If you have smoked or chewed Tobacco  in the last 2 yrs please stop smoking, stop any regular Alcohol  and or any Recreational drug use.  Wear Seat belts while driving.  Do not drive if taking any narcotic, mind altering or controlled substances or recreational drugs or alcohol.

## 2021-02-26 NOTE — Progress Notes (Addendum)
Inpatient Diabetes Program Recommendations  AACE/ADA: New Consensus Statement on Inpatient Glycemic Control   Target Ranges:  Prepandial:   less than 140 mg/dL      Peak postprandial:   less than 180 mg/dL (1-2 hours)      Critically ill patients:  140 - 180 mg/dL   Results for Stanley Levy, Stanley Levy (MRN [id number]) as of 02/26/2021 07:46  Ref. Range 02/25/2021 23:44 02/26/2021 01:05 02/26/2021 01:54 02/26/2021 03:52 02/26/2021 05:12 02/26/2021 05:53  Glucose-Capillary Latest Ref Range: 70 - 99 mg/dL 458 (H) 099 (H) 833 (H) 224 (H) 223 (H) 220 (H)  Results for Stanley Levy (MRN [id number]) as of 02/26/2021 07:46  Ref. Range 02/25/2021 16:56  CO2 Latest Ref Range: 22 - 32 mmol/L 14 (L)  Glucose Latest Ref Range: 70 - 99 mg/dL 734 (HH)  Anion gap Latest Ref Range: 5 - 15  19 (H)  Results for Stanley Levy (MRN [id number]) as of 02/26/2021 07:46  Ref. Range 11/05/2020 03:56  Hemoglobin A1C Latest Ref Range: 4.8 - 5.6 % >15.5 (H)   Review of Glycemic Control  Diabetes history: DM2 Outpatient Diabetes medications: Farxiga 10 mg QAM, Metformin XR 500 mg QAM, 70/30 24 units BID Current orders for Inpatient glycemic control: IV insulin  Inpatient Diabetes Program Recommendations:    Insulin: Once provider is ready to transition from IV to SQ insulin, please consider ordering Semglee 7 units Q24H, CBGs Q4H, and Novolog 0-9 units Q4H.  NOTE: In reviewing chart, patient seen PCP on 12/11/20 and per office note patient was referred to Dr. Fransico Him at prior appointment but patient had not set up an appointment. Noted telephone note on 01/30/21 by Jolayne Haines which notes patient came for appointment with Ronny Bacon, NP on 01/16/21 but was unable to be seen as he did not provide a meter or log of glucose. Patient was rescheduled for 01/31/21 but patient was calling to move appointment one month out. Per H&P  "He states that he has been unable to take his insulin as his wife generally administers this from him but  unfortunately she was found to have passed away." Initial glucose 830 mg/dl on 9/73/53 and patient was started on IV insulin which is currently still ordered. Made recommendations for transition from IV to SQ insulin. Have asked nursing to work with patient on insulin administration once he is transitioned to SQ insulin and allow patient to self administer insulin injections.   Addendum 02/26/21@13 :40-Spoke with patient over the phone regarding DM control. Offered condolences regarding the loss of his wife. Patient notes his wife helped him with his DM medications and he is not sure what the names of his DM medications are. Patient reports that he was administering his own insulin (wife gave him dose he needed) and that he has been taking DM medications as prescribed. Patient reports that glucose has been in the 200-300's mg/dl at home over the past couple of weeks. Patient reports that he has an appointment coming up with Endocrinology in September but not sure of the date as he wife usually handled that for him.  Reviewed A1C results (>15.5% on 11/05/20) and discussed glucose and A1C goals. Discussed importance of checking CBGs and maintaining good CBG control to prevent long-term and short-term complications. Stressed to the patient the importance of improving glycemic control to prevent further complications from uncontrolled diabetes. Asked patient to be sure to take DM medications as prescribed at discharge and asked that he keep his appointment with Endocrinology. Asked that  he take his glucometer or log book of glucose readings to doctor appointments. Informed patient that bedside nursing will be asking him to self administer insulin to ensure he is able to do so.   Patient verbalized understanding of information discussed and reports no further questions at this time related to diabetes. [facility] Endocrinology office and informed that patient has an appointment on September 6th at 11:00 am; will add  to discharge instructions.  Thanks, Orlando Penner, RN, MSN, CDE Diabetes Coordinator Inpatient Diabetes Program [phone] (Team Pager from 8am to 5pm)

## 2021-02-26 NOTE — Progress Notes (Signed)
PROGRESS NOTE    Stanley Levy  IWP:[id number] DOB: December 01, 1955 DOA: 02/25/2021 PCP: Heather Roberts, NP   Brief Narrative:   Stanley Levy is a 65 y.o. male with medical history significant for cardiomyopathy with LVEF 30%, mural thrombus on Eliquis, type 2 diabetes-poorly controlled, hypothyroidism, dyslipidemia, and BPH who was noted by EMS to have hyperglycemia and was brought to the ED for further evaluation.  Patient was admitted for DKA in the setting of highly controlled type 2 diabetes with prior hemoglobin C 15.5%.  He is also suspected to have UTI for which he is on Rocephin.  He will now be transition out to subcutaneous insulin and diet will be started.  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 which will require Nephrology followup outpatient   Pyuria -Patient is asymptomatic from standpoint of UTI -Urine culture will be sent for further evaluation; currently pending -Mild leukocytosis noted and therefore patient will be started empirically on Rocephin (day 2/3)   History of nonischemic cardiomyopathy -LVEF 30-35% -Holding Entresto and Aldactone given AKI -Given gentle fluids -Monitor daily weights   Lipase elevation-downtrending -No significant abdominal pain noted, avoid imaging for now -No further need to monitor   COVID infection -No significant signs or symptoms associated with this at the moment -Isolation precautions -No hypoxemia or infiltrates noted on chest x-ray   LV mural  thrombus -Continue anticoagulation with Eliquis   Hypothyroidism -Continue levothyroxine   BPH -Continue tamsulosin   DVT prophylaxis:Eliquis Code Status: Full Family Communication: Spoke with sister-in-law  [name] 8/30 Disposition Plan:  Status is: Inpatient  Remains inpatient appropriate because:Altered mental status, IV treatments appropriate due to intensity of illness or inability to take PO, and Inpatient level of care appropriate due to severity of illness  Dispo: The patient is from: Home              Anticipated d/c is to: SNF              Patient currently is not medically stable to d/c.   Difficult to place patient No   Consultants:  None  Procedures:  See below  Antimicrobials:  Anti-infectives (From admission, onward)    Start     Dose/Rate Route Frequency Ordered Stop   02/25/21 1900  cefTRIAXone (ROCEPHIN) 1 g in sodium chloride 0.9 % 100 mL IVPB        1 g 200 mL/hr over 30 Minutes Intravenous Every 24 hours 02/25/21 1830         Subjective: Patient seen and evaluated today with no new acute complaints or concerns. No acute concerns or events noted overnight.  He denies any abdominal pain, nausea, or vomiting and states he is ready to eat.  Blood glucose is now better controlled.  Objective: Vitals:   02/26/21 1000 02/26/21 1100 02/26/21 1122 02/26/21 1205  BP: 128/83 109/61  104/60  Pulse: 71 72  (!) 37  Resp: 12 18  (!) 22  Temp:   (!) 97.5 F (36.4 C)   TempSrc:   Oral   SpO2:  100% 100%  (!) 88%  Weight:      Height:        Intake/Output Summary (Last 24 hours) at 02/26/2021 1226 Last data filed at 02/26/2021 1122 Gross per 24 hour  Intake 3712.54 ml  Output 650 ml  Net 3062.54 ml   Filed Weights   02/25/21 1507 02/25/21 2138 02/26/21 0500  Weight: 90.7 kg 77.2 kg 77 kg    Examination:  General exam: Appears calm and comfortable  Respiratory system: Clear to auscultation. Respiratory effort normal. Cardiovascular system:  S1 & S2 heard, RRR.  Gastrointestinal system: Abdomen is soft Central nervous system: Alert and awake Extremities: No edema Skin: No significant lesions noted Psychiatry: Flat affect.    Data Reviewed: I have personally reviewed following labs and imaging studies  CBC: Recent Labs  Lab 02/25/21 1516 02/26/21 0430  WBC 13.3* 15.9*  NEUTROABS 11.2*  --   HGB 13.7 10.2*  HCT 41.0 30.1*  MCV 93.4 92.6  PLT 429* 379   Basic Metabolic Panel: Recent Labs  Lab 02/25/21 1656 02/25/21 2114 02/26/21 0139 02/26/21 0430 02/26/21 0828  NA 128* 136 138 138 139  K 5.5* 4.3 4.0 4.1 3.7  CL 95* 105 106 106 108  CO2 14* 19* 23 22 23   GLUCOSE 830* 359* 224* 229* 139*  BUN 90* 87* 85* 85* 85*  CREATININE 2.65* 2.28* 2.34* 2.31* 2.41*  CALCIUM 8.7* 9.0 9.0 9.1 8.7*  MG  --   --   --  2.6*  --    GFR: Estimated Creatinine Clearance: 28.8 mL/min (A) (by C-G formula based on SCr of 2.41 mg/dL (H)). Liver Function Tests: Recent Labs  Lab 02/25/21 1516  AST 13*  ALT 9  ALKPHOS 102  BILITOT 1.1  PROT 8.1  ALBUMIN 3.3*   Recent Labs  Lab 02/25/21 1516 02/26/21 0430  LIPASE 280* 125*   No results for input(s): AMMONIA in the last 168 hours. Coagulation Profile: No results for input(s): INR, PROTIME in the last 168 hours. Cardiac Enzymes: Recent Labs  Lab 02/25/21 1615  CKTOTAL 24*   BNP (last 3 results) No results for input(s): PROBNP in the last 8760 hours. HbA1C: No results for input(s): HGBA1C in the last 72 hours. CBG: Recent Labs  Lab 02/26/21 0512 02/26/21 0553 02/26/21 0744 02/26/21 0941 02/26/21 1120  GLUCAP 223* 220* 175* 127* 173*   Lipid Profile: No results for input(s): CHOL, HDL, LDLCALC, TRIG, CHOLHDL, LDLDIRECT in the last 72 hours. Thyroid Function Tests: No results for input(s): TSH, T4TOTAL, FREET4, T3FREE, THYROIDAB in the last 72 hours. Anemia Panel: No results for input(s): VITAMINB12, FOLATE, FERRITIN, TIBC, IRON, RETICCTPCT in the last  72 hours. Sepsis Labs: Recent Labs  Lab 02/25/21 1605 02/25/21 1803 02/26/21 0430  LATICACIDVEN 2.5* 2.5* 1.4    Recent Results (from the past 240 hour(s))  Resp Panel by RT-PCR (Flu A&B, Covid)     Status: Abnormal   Collection Time: 02/25/21  3:29 PM   Specimen: Nasopharyngeal(NP) swabs in vial transport medium  Result Value Ref Range Status   SARS Coronavirus 2 by RT PCR POSITIVE (A) NEGATIVE Final    Comment: CRITICAL RESULT CALLED TO, READ BACK BY AND VERIFIED WITH: HOLCOMB,R AT 1650 ON 8.29.22 BY RUCINSKI,B (NOTE) SARS-CoV-2 target nucleic acids are DETECTED.  The SARS-CoV-2 RNA is generally detectable in upper respiratory specimens during the acute phase of infection. Positive results are indicative of the presence of the identified virus, but do not rule out bacterial infection or  co-infection with other pathogens not detected by the test. Clinical correlation with patient history and other diagnostic information is necessary to determine patient infection status. The expected result is Negative.  Fact Sheet for Patients: BloggerCourse.com  Fact Sheet for Healthcare Providers: SeriousBroker.it  This test is not yet approved or cleared by the [place] FDA and  has been authorized for detection and/or diagnosis of SARS-CoV-2 by FDA under an Emergency Use Authorization (EUA).  This EUA will remain in effect (meanin g this test can be used) for the duration of  the COVID-19 declaration under Section 564(b)(1) of the Act, 21 U.S.C. section 360bbb-3(b)(1), unless the authorization is terminated or revoked sooner.     Influenza A by PCR NEGATIVE NEGATIVE Final   Influenza B by PCR NEGATIVE NEGATIVE Final    Comment: (NOTE) The Xpert Xpress SARS-CoV-2/FLU/RSV plus assay is intended as an aid in the diagnosis of influenza from Nasopharyngeal swab specimens and should not be used as a sole basis for treatment. Nasal  washings and aspirates are unacceptable for Xpert Xpress SARS-CoV-2/FLU/RSV testing.  Fact Sheet for Patients: BloggerCourse.com  Fact Sheet for Healthcare Providers: SeriousBroker.it  This test is not yet approved or cleared by the [place] FDA and has been authorized for detection and/or diagnosis of SARS-CoV-2 by FDA under an Emergency Use Authorization (EUA). This EUA will remain in effect (meaning this test can be used) for the duration of the COVID-19 declaration under Section 564(b)(1) of the Act, 21 U.S.C. section 360bbb-3(b)(1), unless the authorization is terminated or revoked.  Performed at [facility], [address], [place], [place] [address]   MRSA Next Gen by PCR, Nasal     Status: None   Collection Time: 02/25/21  9:21 PM   Specimen: Nasal Mucosa; Nasal Swab  Result Value Ref Range Status   MRSA by PCR Next Gen NOT DETECTED NOT DETECTED Final    Comment: (NOTE) The GeneXpert MRSA Assay (FDA approved for NASAL specimens only), is one component of a comprehensive MRSA colonization surveillance program. It is not intended to diagnose MRSA infection nor to guide or monitor treatment for MRSA infections. Test performance is not FDA approved in patients less than 70 years old. Performed at [facility], [address] [place], [place] [address]          Radiology Studies: CT HEAD WO CONTRAST ( )  Result Date: 02/25/2021 CLINICAL DATA:  Fall EXAM: CT HEAD WITHOUT CONTRAST TECHNIQUE: Contiguous axial images were obtained from the base of the skull through the vertex without intravenous contrast. COMPARISON:  CT brain 11/03/2020 FINDINGS: Brain: No acute territorial infarction, hemorrhage, or intracranial mass. Atrophy and chronic small vessel ischemic changes the white matter. Similar ex vacuo enlargement of left lateral ventricle with adjacent white matter encephalomalacia. Stable ventricle size. Punctate left  parietal cortical calcification as before. Vascular: No hyperdense vessels.  No unexpected calcification Skull: Normal. Negative for fracture or focal lesion. Sinuses/Orbits: No acute finding. Patchy mucosal thickening in the sinuses. Other: None IMPRESSION: 1. No CT evidence for acute intracranial abnormality. 2. Atrophy and chronic small vessel ischemic changes of the white matter Electronically Signed   By: Jasmine Pang M.D.   On: 02/25/2021 16:52   DG Chest Port 1 View  Result Date: 02/25/2021 CLINICAL DATA:  Fall. EXAM: PORTABLE CHEST 1 VIEW COMPARISON:  Nov 03, 2020. FINDINGS: The heart size and mediastinal contours are within normal limits. Both lungs are clear. The visualized skeletal structures are unremarkable. IMPRESSION: No active disease. Electronically  Signed   By: Lupita Raider M.D.   On: 02/25/2021 15:56        Scheduled Meds:  apixaban  5 mg Oral BID   Chlorhexidine Gluconate Cloth  6 each Topical Q0600   digoxin  0.125 mg Oral Daily   escitalopram  10 mg Oral Daily   gabapentin  100 mg Oral TID   insulin aspart  0-9 Units Subcutaneous Q4H   insulin glargine-yfgn  7 Units Subcutaneous Once   levothyroxine  25 mcg Oral q morning   metoprolol succinate  25 mg Oral Daily   mirabegron ER  25 mg Oral Daily   rosuvastatin  20 mg Oral Daily   tamsulosin  0.4 mg Oral QPC supper   Continuous Infusions:  cefTRIAXone (ROCEPHIN)  IV Stopped (02/25/21 2005)   dextrose 5% lactated ringers 125 mL/hr at 02/26/21 1122   insulin 3.2 Units/hr (02/26/21 1122)   lactated ringers Stopped (02/26/21 0110)     LOS: 1 day    Time spent: 35 minutes    Vennesa Bastedo Hoover Brunette, DO [facility]  If 7PM-7AM, please contact night-coverage [email or web address] 02/26/2021, 12:26 PM

## 2021-02-27 LAB — CBC
HCT: 34.3 % — ABNORMAL LOW (ref 39.0–52.0)
Hemoglobin: 11.5 g/dL — ABNORMAL LOW (ref 13.0–17.0)
MCH: 31.3 pg (ref 26.0–34.0)
MCHC: 33.5 g/dL (ref 30.0–36.0)
MCV: 93.5 fL (ref 80.0–100.0)
Platelets: 270 10*3/uL (ref 150–400)
RBC: 3.67 MIL/uL — ABNORMAL LOW (ref 4.22–5.81)
RDW: 11.9 % (ref 11.5–15.5)
WBC: 9.8 10*3/uL (ref 4.0–10.5)
nRBC: 0 % (ref 0.0–0.2)

## 2021-02-27 LAB — URINE CULTURE: Culture: <10000 — AB

## 2021-02-27 LAB — BASIC METABOLIC PANEL
Anion gap: 8 (ref 5–15)
BUN: 70 mg/dL — ABNORMAL HIGH (ref 8–23)
CO2: 23 mmol/L (ref 22–32)
Calcium: 8.5 mg/dL — ABNORMAL LOW (ref 8.9–10.3)
Chloride: 104 mmol/L (ref 98–111)
Creatinine, Ser: 1.86 mg/dL — ABNORMAL HIGH (ref 0.61–1.24)
GFR, Estimated: 39 mL/min — ABNORMAL LOW (ref >60)
Glucose, Bld: 297 mg/dL — ABNORMAL HIGH (ref 70–99)
Potassium: 3.9 mmol/L (ref 3.5–5.1)
Sodium: 135 mmol/L (ref 135–145)

## 2021-02-27 LAB — GLUCOSE, CAPILLARY
Glucose-Capillary: 297 mg/dL — ABNORMAL HIGH (ref 70–99)
Glucose-Capillary: 275 mg/dL — ABNORMAL HIGH (ref 70–99)
Glucose-Capillary: 298 mg/dL — ABNORMAL HIGH (ref 70–99)
Glucose-Capillary: 72 mg/dL (ref 70–99)
Glucose-Capillary: 176 mg/dL — ABNORMAL HIGH (ref 70–99)

## 2021-02-27 LAB — HEMOGLOBIN A1C
Hgb A1c MFr Bld: >15.5 % — ABNORMAL HIGH (ref 4.8–5.6)
Mean Plasma Glucose: >398 mg/dL

## 2021-02-27 LAB — MAGNESIUM: Magnesium: 2.3 mg/dL (ref 1.7–2.4)

## 2021-02-27 MED ORDER — INSULIN GLARGINE-YFGN 100 UNIT/ML ~~LOC~~ SOLN
30.0000 [IU] | Freq: Every day | SUBCUTANEOUS | Status: DC
  Administered 2021-02-27: 30 [IU] via SUBCUTANEOUS
  Filled 2021-02-27 (×2): qty 0.3

## 2021-02-27 MED ORDER — NOVOLOG MIX 70/30 FLEXPEN (70-30) 100 UNIT/ML ~~LOC~~ SUPN: 30.0000 [IU] | PEN_INJECTOR | Freq: Two times a day (BID) | SUBCUTANEOUS | 5 refills | Status: AC

## 2021-02-27 MED ORDER — LACTATED RINGERS IV SOLN: INTRAVENOUS | Status: DC

## 2021-02-27 MED ORDER — FLUCONAZOLE 100 MG PO TABS: 100.0000 mg | ORAL_TABLET | Freq: Every day | ORAL | 0 refills | Status: AC

## 2021-02-27 MED ORDER — INSULIN ASPART 100 UNIT/ML IJ SOLN
10.0000 [IU] | Freq: Three times a day (TID) | INTRAMUSCULAR | Status: DC
  Administered 2021-02-27 (×2): 10 [IU] via SUBCUTANEOUS

## 2021-02-27 MED ORDER — INSULIN ASPART 100 UNIT/ML IJ SOLN
0.0000 [IU] | Freq: Every day | INTRAMUSCULAR | Status: DC
  Administered 2021-03-02 – 2021-03-03 (×2): 2 [IU] via SUBCUTANEOUS

## 2021-02-27 MED ORDER — INSULIN ASPART 100 UNIT/ML IJ SOLN
0.0000 [IU] | Freq: Three times a day (TID) | INTRAMUSCULAR | Status: DC
  Administered 2021-02-27 (×2): 11 [IU] via SUBCUTANEOUS
  Administered 2021-02-28: 4 [IU] via SUBCUTANEOUS
  Administered 2021-02-28: 20 [IU] via SUBCUTANEOUS
  Administered 2021-02-28: 7 [IU] via SUBCUTANEOUS
  Administered 2021-03-01 (×2): 15 [IU] via SUBCUTANEOUS
  Administered 2021-03-01: 3 [IU] via SUBCUTANEOUS
  Administered 2021-03-02 – 2021-03-03 (×4): 7 [IU] via SUBCUTANEOUS
  Administered 2021-03-03: 3 [IU] via SUBCUTANEOUS
  Administered 2021-03-03 – 2021-03-04 (×4): 4 [IU] via SUBCUTANEOUS

## 2021-02-27 MED ORDER — ENTRESTO 24-26 MG PO TABS: 1.0000 | ORAL_TABLET | Freq: Two times a day (BID) | ORAL | 6 refills | Status: AC

## 2021-02-27 MED ORDER — SPIRONOLACTONE 25 MG PO TABS: 12.5000 mg | ORAL_TABLET | Freq: Every day | ORAL | 3 refills | Status: AC

## 2021-02-27 MED ORDER — POTASSIUM CHLORIDE CRYS ER 20 MEQ PO TBCR: 40.0000 meq | EXTENDED_RELEASE_TABLET | Freq: Every day | ORAL | 3 refills | Status: AC

## 2021-02-27 NOTE — Progress Notes (Signed)
Pt arrived to room 340 via WC from ICU. Ambulatory from [facility] to bed, gait steady, no SOB with exertion. Oriented to room and safety procedures, states understanding. Bed alarm on for safety due to HFR r/t frequent falls PTA to facility. Call bell within reach. IV sites x2 WNL.

## 2021-02-27 NOTE — Progress Notes (Signed)
Discharge barriers discussed with [facility] team and physician.   This RN expressed concerns regarding patient's forgetfulness, inability to process and comprehend his medication administration instructions with teach back of times and dosages of medication, patient is also very weak and unable to perform ADLs independently. Patient used the urinal x1 today, and then had subsequent incontinence episodes, without telling staff he needed assistance or noticing his bed was soiled.   Spoke with patient's sister in law on the phone, and per her report, patient had become increasingly dependent on his wife who just passed, to care for his needs. His wife was managing his medications and helping him with administering his insulin. Sister in law stated she would do what she can, but cant promise to be there always, and stated all she could currently do is drop of meals until he was past his covid quarantine.   Patient was asked if he felt he was strong enough to go home alone with home health visits, as his sister in law stated she would not be able to help out "much" because she has other commitments and responsibilities with her work and family. Patient stated he did not feel like he was strong enough or safe to be home alone at this time.   TOC and MD notified. Arboriculturist notified.

## 2021-02-27 NOTE — TOC Initial Note (Signed)
Transition of Care [facility]) - Initial/Assessment Note    Patient Details  Name: Stanley Levy MRN: [id number] Date of Birth: 1956-03-12  [facility]) CM/SW Contact:    Armanda Heritage, RN Phone Number: 02/27/2021, 12:29 PM  Clinical Narrative:                 CM spoke with patient and his sister in law [name].  Patient reports that he wishes to return home at discharge.  CM questioned patient on his plans to access food, medications, and manage his adl.  Patient reports he plans to care for himself with support from his sister in law [name].  CM spoke with [name] to verify this information and [name] reports that she has helped in the past and plans to assist now as well with patient getting access to food and follow up appointments.  [name] reports patient typically is weak when he is ill and gets better with time.  When he is well, patient is able to manage all adl and can drive to get to store, pharmacy, and MD appointments.  In the beginning [name] plans to assist with these things.  CM discussed with patient and [name] his options for long term assistance and how to access resources in the community should they ever feel he needs long term placement.  At this time both patient and [name] express that it is their wish for patient to return home with [facility] and family support. MD notified of this and request for [facility] orders made.   Expected Discharge Plan: Home w Home Health Services Barriers to Discharge: Continued Medical Work up   Patient Goals and CMS Choice Patient states their goals for this hospitalization and ongoing recovery are:: wants to go home and live by himself CMS Medicare.gov Compare Post Acute Care list provided to:: Patient Choice offered to / list presented to : Patient  Expected Discharge Plan and Services Expected Discharge Plan: Home w Home Health Services   Discharge Planning Services: CM Consult Post Acute Care Choice: Home Health Living arrangements for the  past 2 months: Single Family Home                                      Prior Living Arrangements/Services Living arrangements for the past 2 months: Single Family Home Lives with:: Self Patient language and need for interpreter reviewed:: Yes Do you feel safe going back to the place where you live?: Yes      Need for Family Participation in Patient Care: Yes (Comment) Care giver support system in place?: Yes (comment)   Criminal Activity/Legal Involvement Pertinent to Current Situation/Hospitalization: No - Comment as needed  Activities of Daily Living Home Assistive Devices/Equipment: CBG Meter ADL Screening (condition at time of admission) Patient's cognitive ability adequate to safely complete daily activities?: No Is the patient deaf or have difficulty hearing?: No Does the patient have difficulty seeing, even when wearing glasses/contacts?: No Does the patient have difficulty concentrating, remembering, or making decisions?: Yes Patient able to express need for assistance with ADLs?: No Does the patient have difficulty dressing or bathing?: No Independently performs ADLs?: No Communication: Needs assistance, Independent Dressing (OT): Independent, Needs assistance Grooming: Independent, Needs assistance Feeding: Independent, Needs assistance Bathing: Independent, Needs assistance Toileting: Independent, Needs assistance In/Out Bed: Independent, Needs assistance Walks in Home: Needs assistance, Independent with device (comment) Does the patient have difficulty walking  or climbing stairs?: Yes Weakness of Legs: Both Weakness of Arms/Hands: Both  Permission Sought/Granted                  Emotional Assessment Appearance:: Appears stated age Attitude/Demeanor/Rapport: Engaged Affect (typically observed): Accepting Orientation: : Oriented to Self, Oriented to Place, Oriented to  Time, Oriented to Situation   Psych Involvement: No (comment)  Admission  diagnosis:  DKA (diabetic ketoacidosis) (HCC) [E11.10] AKI (acute kidney injury) (HCC) [N17.9] Diabetic ketoacidosis without coma associated with type 2 diabetes mellitus (HCC) [E11.10] Patient Active Problem List   Diagnosis Date Noted   DKA (diabetic ketoacidosis) (HCC) 02/25/2021   Anemia 12/11/2020   Nocturia 12/11/2020   Hypernatremia 11/29/2020   Acute metabolic encephalopathy 11/03/2020   Nonproliferative diabetic retinopathy (HCC) 12/27/2019   Pressure injury of skin 09/21/2019   At high risk for injury related to fall 09/08/2019   Overweight with body mass index (BMI) of 28 to 28.9 in adult 09/08/2019   Goals of care, counseling/discussion    Welcome to Medicare preventive visit    Hypothyroidism 08/18/2019   Cognitive changes/memory Challenges 08/18/2019   LV (left ventricular) mural thrombus    Severe left ventricular systolic dysfunction    Mixed hyperlipidemia    Memory loss 08/16/2019   Falls frequently 08/16/2019   Fatigue 08/16/2019   Essential hypertension, benign 08/16/2019   Elevated TSH 08/16/2019   Uncontrolled type 2 diabetes mellitus with hyperglycemia (HCC) 08/16/2019   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 % 08/16/2019   CHF (congestive heart failure) (HCC) 08/15/2019   Encounter for medication management 08/12/2019   Type 2 diabetes mellitus with hyperglycemia (HCC) 08/01/2019   PCP:  Heather Roberts, NP Pharmacy:   [facility] [id number] - [place], [place] - [address] AT [facility] [address] [place] [address] Phone: [phone] Fax: [phone]     Social Determinants of Health (SDOH) Interventions    Readmission Risk Interventions Readmission Risk Prevention Plan 11/06/2020 09/22/2019 08/02/2019  Post Dischage Appt - - Complete  Medication Screening - - Complete  Transportation Screening Complete Complete Complete  PCP  or Specialist Appt within 3-5 Days - Complete -  HRI or Home Care Consult Complete Complete -  Social Work Consult for Recovery Care Planning/Counseling Complete Complete -  Palliative Care Screening Not Applicable Complete -  Medication Review Oceanographer) Complete Complete -  Some recent data might be hidden

## 2021-02-27 NOTE — [place] FL2 (Signed)
[place] MEDICAID FL2 LEVEL OF CARE SCREENING TOOL     IDENTIFICATION  Patient Name: Stanley Levy Birthdate: 15-Jun-1956 Sex: male Admission Date (Current Location): 02/25/2021  [facility] and [place] Number:  [employer] and Address:  [facility],  [address] [place] [address]      Provider Number: [id number]  Attending Physician Name and Address:  Cleora Fleet, MD  Relative Name and Phone Number:  [name] (Aunt)   [phone]    Current Level of Care: Hospital Recommended Level of Care: Skilled Nursing Facility Prior Approval Number:    Date Approved/Denied:   PASRR Number: [id number]  Discharge Plan: SNF    Current Diagnoses: Patient Active Problem List   Diagnosis Date Noted   DKA (diabetic ketoacidosis) (HCC) 02/25/2021   Anemia 12/11/2020   Nocturia 12/11/2020   Hypernatremia 11/29/2020   Acute metabolic encephalopathy 11/03/2020   Nonproliferative diabetic retinopathy (HCC) 12/27/2019   Pressure injury of skin 09/21/2019   At high risk for injury related to fall 09/08/2019   Overweight with body mass index (BMI) of 28 to 28.9 in adult 09/08/2019   Goals of care, counseling/discussion    Welcome to Medicare preventive visit    Hypothyroidism 08/18/2019   Cognitive changes/memory Challenges 08/18/2019   LV (left ventricular) mural thrombus    Severe left ventricular systolic dysfunction    Mixed hyperlipidemia    Memory loss 08/16/2019   Falls frequently 08/16/2019   Fatigue 08/16/2019   Essential hypertension, benign 08/16/2019   Elevated TSH 08/16/2019   Uncontrolled type 2 diabetes mellitus with hyperglycemia (HCC) 08/16/2019   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 % 08/16/2019   CHF (congestive heart failure) (HCC) 08/15/2019   Encounter for medication management 08/12/2019   Type 2 diabetes mellitus  with hyperglycemia (HCC) 08/01/2019    Orientation RESPIRATION BLADDER Height & Weight     Self, Time, Situation, Place  Normal Incontinent Weight: 178 lb 2.1 oz (80.8 kg) Height:  5\' 8"  (172.7 cm)  BEHAVIORAL SYMPTOMS/MOOD NEUROLOGICAL BOWEL NUTRITION STATUS      Continent Diet (heart healthy/carb modified)  AMBULATORY STATUS COMMUNICATION OF NEEDS Skin   Limited Assist Verbally Normal                       Personal Care Assistance Level of Assistance  Bathing, Feeding, Dressing Bathing Assistance: Limited assistance Feeding assistance: Independent Dressing Assistance: Limited assistance     Functional Limitations Info  Sight, Hearing, Speech Sight Info: Adequate Hearing Info: Adequate Speech Info: Adequate    SPECIAL CARE FACTORS FREQUENCY  PT (By licensed PT)     PT Frequency: 5x/week              Contractures Contractures Info: Not present    Additional Factors Info  Code Status, Allergies, Isolation Precautions Code Status Info: Full Allergies Info: NKA     Isolation Precautions Info: 02/25/21 COVID+     Current Medications (02/27/2021):  This is the current hospital active medication list Current Facility-Administered Medications  Medication Dose Route Frequency Provider Last Rate Last Admin   acetaminophen (TYLENOL) tablet 650 mg  650 mg Oral Q6H PRN 03/01/2021, Pratik D, DO       Or   acetaminophen (TYLENOL) suppository 650 mg  650 mg Rectal Q6H PRN Sherryll Burger, Pratik D, DO       apixaban (ELIQUIS) tablet 5 mg  5  mg Oral BID Maurilio Lovely D, DO   5 mg at 02/27/21 1014   Chlorhexidine Gluconate Cloth 2 % PADS 6 each  6 each Topical Q0600 Maurilio Lovely D, DO   6 each at 02/26/21 0551   dextrose 50 % solution 0-50 mL  0-50 mL Intravenous PRN Sherryll Burger, Pratik D, DO       digoxin Margit Banda) tablet 0.125 mg  0.125 mg Oral Daily Sherryll Burger, Pratik D, DO   0.125 mg at 02/27/21 1014   escitalopram (LEXAPRO) tablet 10 mg  10 mg Oral Daily Sherryll Burger, Pratik D, DO   10 mg at 02/27/21 1014    gabapentin (NEURONTIN) capsule 100 mg  100 mg Oral TID Maurilio Lovely D, DO   100 mg at 02/27/21 1014   insulin aspart (novoLOG) injection 0-20 Units  0-20 Units Subcutaneous TID WC Johnson, Clanford L, MD   11 Units at 02/27/21 1146   insulin aspart (novoLOG) injection 0-5 Units  0-5 Units Subcutaneous QHS Johnson, Clanford L, MD       insulin aspart (novoLOG) injection 10 Units  10 Units Subcutaneous TID WC Johnson, Clanford L, MD   10 Units at 02/27/21 1146   insulin glargine-yfgn (SEMGLEE) injection 30 Units  30 Units Subcutaneous Daily Laural Benes, Clanford L, MD   30 Units at 02/27/21 1014   levothyroxine (SYNTHROID) tablet 25 mcg  25 mcg Oral q morning Maurilio Lovely D, DO   25 mcg at 02/27/21 0500   metoprolol succinate (TOPROL-XL) 24 hr tablet 25 mg  25 mg Oral Daily Sherryll Burger, Pratik D, DO   25 mg at 02/26/21 0943   mirabegron ER (MYRBETRIQ) tablet 25 mg  25 mg Oral Daily Sherryll Burger, Pratik D, DO   25 mg at 02/27/21 1014   ondansetron (ZOFRAN) tablet 4 mg  4 mg Oral Q6H PRN Sherryll Burger, Pratik D, DO       Or   ondansetron (ZOFRAN) injection 4 mg  4 mg Intravenous Q6H PRN Sherryll Burger, Pratik D, DO       rosuvastatin (CRESTOR) tablet 20 mg  20 mg Oral Daily Sherryll Burger, Pratik D, DO   20 mg at 02/27/21 1014   tamsulosin (FLOMAX) capsule 0.4 mg  0.4 mg Oral QPC supper Maurilio Lovely D, DO   0.4 mg at 02/26/21 1739     Discharge Medications: Please see discharge summary for a list of discharge medications.  Relevant Imaging Results:  Relevant Lab Results:   Additional Information ssn [phone] COVID+ ON 02/25/21  Angelyn Osterberg, Juleen China, LCSW

## 2021-02-27 NOTE — TOC Progression Note (Signed)
Transition of Care [facility]) - Progression Note    Patient Details  Name: Stanley Levy MRN: [id number] Date of Birth: 1955/11/09  [facility] CM/SW Contact  Armanda Heritage, RN Phone Number: 02/27/2021, 4:24 PM  Clinical Narrative:    Cm spoke with patient and sister in law again regarding their concerns about return home.  Patient reports he has not been doing well with insulin administration, reports he cannot see well without his glasses.  CM spoke with sister in law who plans to go to patients home and look for his glasses.   CM spoke with both patient and sister in law and discussed SNF placement process including barrier to placement because patient is Covid +, have additionally discussed the need for authorization from insurance.  Per conversation with patient should there be no facilities available for SNF stay due to him being Covid +, plan is for patient to return home.  Have spoken with patient about continuing to practice his insulin administration and have requested for diabetes coordinator consult to assist with teaching.     Expected Discharge Plan: Home w Home Health Services Barriers to Discharge: Continued Medical Work up  Expected Discharge Plan and Services Expected Discharge Plan: Home w Home Health Services   Discharge Planning Services: CM Consult Post Acute Care Choice: Home Health Living arrangements for the past 2 months: Single Family Home Expected Discharge Date: 02/27/21                         HH Arranged: RN, PT, Social Work HH Agency: [facility] Date [place] Agency Contacted: 02/27/21 Time HH Agency Contacted: 1401 Representative spoke with at [facility] Agency: Kandee Keen   Social Determinants of Health (SDOH) Interventions    Readmission Risk Interventions Readmission Risk Prevention Plan 11/06/2020 09/22/2019 08/02/2019  Post Dischage Appt - - Complete  Medication Screening - - Complete  Transportation Screening Complete Complete  Complete  PCP or Specialist Appt within 3-5 Days - Complete -  HRI or Home Care Consult Complete Complete -  Social Work Consult for Recovery Care Planning/Counseling Complete Complete -  Palliative Care Screening Not Applicable Complete -  Medication Review Oceanographer) Complete Complete -  Some recent data might be hidden

## 2021-02-27 NOTE — Progress Notes (Signed)
Report called to 300 unit. All questions answered,

## 2021-02-27 NOTE — Discharge Summary (Signed)
Physician Discharge Summary  [name] Levy:[id number] DOB: 1955/11/05 DOA: 02/25/2021  PCP: Noreene Larsson, NP  Admit date: 02/25/2021 Discharge date: 02/27/2021  Admitted From:  Home  Disposition: home with [facility] (PT REFUSES SNF after intensive counseling)  Recommendations for Outpatient Follow-up:  Follow up with PCP in 1 weeks Follow up with endocrinology Dr. Dorris Fetch on 03/05/21 as scheduled Follow up with cardiology on 03/07/21 as scheduled Please check BMP in one week to check electrolytes and renal function   Home Health:  PT, RN, SW  Discharge Condition: STABLE   CODE STATUS: FULL DIET: heart healthy carb modified    Brief Hospitalization Summary: Please see all hospital notes, images, labs for full details of the hospitalization. ADMISSION HPI: Stanley Levy is a 65 y.o. male with medical history significant for cardiomyopathy with LVEF 30%, mural thrombus on Eliquis, type 2 diabetes-poorly controlled, hypothyroidism, dyslipidemia, and BPH who was noted by EMS to have hyperglycemia and was brought to the ED for further evaluation.  Of note, patient is a poor historian.  He states that he has been unable to take his insulin as his wife generally administers this from him but unfortunately she was found to have passed away.  The patient's sister-in-law came to check in on them earlier today and discovered that her sister had expired and found the patient supposedly on the floor after having fallen overnight.  Patient states he could not get up from the floor.  The patient has felt more fatigued and generally weak and has had increased thirst.  He denies any nausea, vomiting, diarrhea, or urinary symptoms.  He denies any shortness of breath, fevers, chills, cough, or congestion.  He has had 2 prior COVID vaccinations.   ED Course: Vital signs demonstrate tachycardia and patient has multiple electrolyte abnormalities with glucose of 896, lactic acid 2.5, sodium 128, potassium 5.9, BUN 92,  creatinine 2.73.  CT of the head was noted to be negative and chest x-ray negative for any acute abnormalities.  He is noted to be COVID-positive.  Lipase noted to be 280.  Urine analysis with signs of pyuria noted.  CK level of 24.    HOSPITAL COURSE:  This patient was admitted for DKA with a hemoglobin A1c greater than 15% indicating that he is not taking insulin regularly and there was a suspicion of a UTI however his urine culture has been indeterminate and no significant growth.  He was briefly on IV ceftriaxone however this has been discontinued with a negative urine culture.  He was treated with IV insulin and subsequently transitioned over to long-acting insulin and prandial coverage.  He was seen by the diabetic educator and they noted that patient has an appointment with his endocrinologist on 03/05/2021.  The patient normally should be taking 70/30 insulin twice daily we have increased the dose from 25 units to 30 units taken twice daily and asked him to please check his blood glucose at least 4 times per day.  He also takes [place] which we have asked him to continue and discontinue the metformin due to chronic kidney disease.  He was severely dehydrated on admission and treated with IV fluid hydration.  He had an acute on chronic kidney injury that improved with IV fluid hydration.  He is adamant that he discharged home.  PT evaluated him and strongly recommended SNF.  However, after counseling with patient he continues to refuse SNF and will go home.  He is agreeable to home health care  which we have made orders for PT RN and social work.  He is high risk for rehospitalization and poor outcome given poor health literacy, poor compliance and inadequate social support at home.  He recently lost his wife while in hospital and that resources no longer available to him either.  Again he was counseled and remains adamant that he would discharge home and refuses SNF.  He has close outpatient follow-up with  his cardiologist, and endocrinologist.  Discharge Diagnoses:  Active Problems:   DKA (diabetic ketoacidosis) ([place])   Discharge Instructions:  Allergies as of 02/27/2021   No Known Allergies      Medication List     STOP taking these medications    metFORMIN 500 MG 24 hr tablet Commonly known as: Glucophage XR       TAKE these medications    Accu-Chek Guide test strip Generic drug: glucose blood USE 4 TIMES A DAY AS DIRECTED   acetaminophen 325 MG tablet Commonly known as: TYLENOL Take 2 tablets (650 mg total) by mouth every 4 (four) hours as needed for headache or mild pain.   apixaban 5 MG Tabs tablet Commonly known as: ELIQUIS Take 1 tablet (5 mg total) by mouth 2 (two) times daily.   B-D UF III MINI PEN NEEDLES 31G X 5 MM Misc Generic drug: Insulin Pen Needle USE TWICE A DAY   blood glucose meter kit and supplies Dispense based on patient and insurance preference. Use four times daily as directed. (FOR ICD-10 E11.65).   dapagliflozin propanediol 10 MG Tabs tablet Commonly known as: Farxiga Take 1 tablet (10 mg total) by mouth daily before breakfast.   digoxin 0.125 MG tablet Commonly known as: LANOXIN Take 1 tablet (0.125 mg total) by mouth daily.   Entresto 24-26 MG Generic drug: sacubitril-valsartan Take 1 tablet by mouth 2 (two) times daily. Start taking on: March 01, 2021 What changed: These instructions start on March 01, 2021. If you are unsure what to do until then, ask your doctor or other care provider.   escitalopram 10 MG tablet Commonly known as: Lexapro Take 1 tablet (10 mg total) by mouth daily.   fluconazole 100 MG tablet Commonly known as: Diflucan Take 1 tablet (100 mg total) by mouth daily for 7 days.   gabapentin 100 MG capsule Commonly known as: Neurontin Take 1 capsule (100 mg total) by mouth 3 (three) times daily.   imipramine 50 MG tablet Commonly known as: TOFRANIL TAKE 1 TABLET BY MOUTH EVERYDAY AT BEDTIME    levothyroxine 25 MCG tablet Commonly known as: SYNTHROID Take 1 tablet (25 mcg total) by mouth daily before breakfast. What changed: Another medication with the same name was removed. Continue taking this medication, and follow the directions you see here.   metoprolol succinate 25 MG 24 hr tablet Commonly known as: TOPROL-XL Take 1 tablet (25 mg total) by mouth daily.   mirabegron ER 25 MG Tb24 tablet Commonly known as: Myrbetriq Take 1 tablet (25 mg total) by mouth daily.   NovoLOG Mix 70/30 FlexPen (70-30) 100 UNIT/ML FlexPen Generic drug: insulin aspart protamine - aspart Inject 30 Units into the skin 2 (two) times daily with a meal. What changed: See the new instructions.   OneTouch Delica Lancets 63J Misc Four times daily testing  dx e11.65   Lancets 30G Misc Check BG twice daily, once before breakfast and once before supper.   potassium chloride SA 20 MEQ tablet Commonly known as: KLOR-CON Take 2 tablets (40 mEq total)  by mouth daily. Start taking on: March 01, 2021 What changed: These instructions start on March 01, 2021. If you are unsure what to do until then, ask your doctor or other care provider.   rosuvastatin 20 MG tablet Commonly known as: Crestor Take 1 tablet (20 mg total) by mouth daily.   spironolactone 25 MG tablet Commonly known as: ALDACTONE Take 0.5 tablets (12.5 mg total) by mouth daily. Start taking on: February 28, 2021   tamsulosin 0.4 MG Caps capsule Commonly known as: FLOMAX Take 1 capsule (0.4 mg total) by mouth daily after supper.   Vitamin D (Ergocalciferol) 1.25 MG (50000 UNIT) Caps capsule Commonly known as: DRISDOL Take 1 capsule (50,000 Units total) by mouth every 7 (seven) days.        Follow-up Information     Noreene Larsson, NP. Schedule an appointment as soon as possible for a visit in 1 week(s).   Specialty: Nurse Practitioner Why: Hospital Follow Up Contact information: [address]  [place] [address] [phone]         Cassandria Anger, MD. Go on 03/05/2021.   Specialty: Endocrinology Why: 11 am, Hospital Follow Up Contact information: [place] [address] [phone]         Verta Ellen., NP. Go on 03/07/2021.   Specialty: Cardiology Why: [facility] Follow Up Contact information: [place] [address] [phone]                No Known Allergies Allergies as of 02/27/2021   No Known Allergies      Medication List     STOP taking these medications    metFORMIN 500 MG 24 hr tablet Commonly known as: Glucophage XR       TAKE these medications    Accu-Chek Guide test strip Generic drug: glucose blood USE 4 TIMES A DAY AS DIRECTED   acetaminophen 325 MG tablet Commonly known as: TYLENOL Take 2 tablets (650 mg total) by mouth every 4 (four) hours as needed for headache or mild pain.   apixaban 5 MG Tabs tablet Commonly known as: ELIQUIS Take 1 tablet (5 mg total) by mouth 2 (two) times daily.   B-D UF III MINI PEN NEEDLES 31G X 5 MM Misc Generic drug: Insulin Pen Needle USE TWICE A DAY   blood glucose meter kit and supplies Dispense based on patient and insurance preference. Use four times daily as directed. (FOR ICD-10 E11.65).   dapagliflozin propanediol 10 MG Tabs tablet Commonly known as: Farxiga Take 1 tablet (10 mg total) by mouth daily before breakfast.   digoxin 0.125 MG tablet Commonly known as: LANOXIN Take 1 tablet (0.125 mg total) by mouth daily.   Entresto 24-26 MG Generic drug: sacubitril-valsartan Take 1 tablet by mouth 2 (two) times daily. Start taking on: March 01, 2021 What changed: These instructions start on March 01, 2021. If you are unsure what to do until then, ask your doctor or other care provider.   escitalopram 10 MG tablet Commonly known as: Lexapro Take 1 tablet (10 mg total) by mouth daily.   fluconazole 100 MG  tablet Commonly known as: Diflucan Take 1 tablet (100 mg total) by mouth daily for 7 days.   gabapentin 100 MG capsule Commonly known as: Neurontin Take 1 capsule (100 mg total) by mouth 3 (three) times daily.   imipramine 50 MG tablet Commonly known as: TOFRANIL TAKE 1 TABLET  BY MOUTH EVERYDAY AT BEDTIME   levothyroxine 25 MCG tablet Commonly known as: SYNTHROID Take 1 tablet (25 mcg total) by mouth daily before breakfast. What changed: Another medication with the same name was removed. Continue taking this medication, and follow the directions you see here.   metoprolol succinate 25 MG 24 hr tablet Commonly known as: TOPROL-XL Take 1 tablet (25 mg total) by mouth daily.   mirabegron ER 25 MG Tb24 tablet Commonly known as: Myrbetriq Take 1 tablet (25 mg total) by mouth daily.   NovoLOG Mix 70/30 FlexPen (70-30) 100 UNIT/ML FlexPen Generic drug: insulin aspart protamine - aspart Inject 30 Units into the skin 2 (two) times daily with a meal. What changed: See the new instructions.   OneTouch Delica Lancets 76B Misc Four times daily testing  dx e11.65   Lancets 30G Misc Check BG twice daily, once before breakfast and once before supper.   potassium chloride SA 20 MEQ tablet Commonly known as: KLOR-CON Take 2 tablets (40 mEq total) by mouth daily. Start taking on: March 01, 2021 What changed: These instructions start on March 01, 2021. If you are unsure what to do until then, ask your doctor or other care provider.   rosuvastatin 20 MG tablet Commonly known as: Crestor Take 1 tablet (20 mg total) by mouth daily.   spironolactone 25 MG tablet Commonly known as: ALDACTONE Take 0.5 tablets (12.5 mg total) by mouth daily. Start taking on: February 28, 2021   tamsulosin 0.4 MG Caps capsule Commonly known as: FLOMAX Take 1 capsule (0.4 mg total) by mouth daily after supper.   Vitamin D (Ergocalciferol) 1.25 MG (50000 UNIT) Caps capsule Commonly known as:  DRISDOL Take 1 capsule (50,000 Units total) by mouth every 7 (seven) days.        Procedures/Studies: CT HEAD WO CONTRAST (5MM)  Result Date: 02/25/2021 CLINICAL DATA:  Fall EXAM: CT HEAD WITHOUT CONTRAST TECHNIQUE: Contiguous axial images were obtained from the base of the skull through the vertex without intravenous contrast. COMPARISON:  CT brain 11/03/2020 FINDINGS: Brain: No acute territorial infarction, hemorrhage, or intracranial mass. Atrophy and chronic small vessel ischemic changes the white matter. Similar ex vacuo enlargement of left lateral ventricle with adjacent white matter encephalomalacia. Stable ventricle size. Punctate left parietal cortical calcification as before. Vascular: No hyperdense vessels.  No unexpected calcification Skull: Normal. Negative for fracture or focal lesion. Sinuses/Orbits: No acute finding. Patchy mucosal thickening in the sinuses. Other: None IMPRESSION: 1. No CT evidence for acute intracranial abnormality. 2. Atrophy and chronic small vessel ischemic changes of the white matter Electronically Signed   By: Donavan Foil M.D.   On: 02/25/2021 16:52   DG Chest Port 1 View  Result Date: 02/25/2021 CLINICAL DATA:  Fall. EXAM: PORTABLE CHEST 1 VIEW COMPARISON:  Nov 03, 2020. FINDINGS: The heart size and mediastinal contours are within normal limits. Both lungs are clear. The visualized skeletal structures are unremarkable. IMPRESSION: No active disease. Electronically Signed   By: Marijo Conception M.D.   On: 02/25/2021 15:56     Subjective: Pt reports that he is not going to SNF, he refuses SNF and will only go home, does accept home health support.   Discharge Exam: Vitals:   02/27/21 1100 02/27/21 1133  BP: 121/68   Pulse: 64 68  Resp: 19 (!) 21  Temp:  (!) 97.5 F (36.4 C)  SpO2: 92% 99%   Vitals:   02/27/21 0900 02/27/21 1000 02/27/21 1100 02/27/21 1133  BP: Marland Kitchen)  79/54 (!) 98/58 121/68   Pulse: 78 80 64 68  Resp: 20 18 19  (!) 21  Temp:    (!)  97.5 F (36.4 C)  TempSrc:    Oral  SpO2: 98% 97% 92% 99%  Weight:      Height:       General: Pt is alert, awake, not in acute distress Cardiovascular: normal S1/S2 +, no rubs, no gallops Respiratory: CTA bilaterally, no wheezing, no rhonchi Abdominal: Soft, NT, ND, bowel sounds + Extremities: no edema, no cyanosis   The results of significant diagnostics from this hospitalization (including imaging, microbiology, ancillary and laboratory) are listed below for reference.     Microbiology: Recent Results (from the past 240 hour(s))  Resp Panel by RT-PCR (Flu A&B, Covid)     Status: Abnormal   Collection Time: 02/25/21  3:29 PM   Specimen: Nasopharyngeal(NP) swabs in vial transport medium  Result Value Ref Range Status   SARS Coronavirus 2 by RT PCR POSITIVE (A) NEGATIVE Final    Comment: CRITICAL RESULT CALLED TO, READ BACK BY AND VERIFIED WITH: HOLCOMB,R AT 2595 ON 8.29.22 BY RUCINSKI,B (NOTE) SARS-CoV-2 target nucleic acids are DETECTED.  The SARS-CoV-2 RNA is generally detectable in upper respiratory specimens during the acute phase of infection. Positive results are indicative of the presence of the identified virus, but do not rule out bacterial infection or co-infection with other pathogens not detected by the test. Clinical correlation with patient history and other diagnostic information is necessary to determine patient infection status. The expected result is Negative.  Fact Sheet for Patients: EntrepreneurPulse.com.au  Fact Sheet for Healthcare Providers: IncredibleEmployment.be  This test is not yet approved or cleared by the [place] FDA and  has been authorized for detection and/or diagnosis of SARS-CoV-2 by FDA under an Emergency Use Authorization (EUA).  This EUA will remain in effect (meanin g this test can be used) for the duration of  the COVID-19 declaration under Section 564(b)(1) of the Act, 21 U.S.C. section  360bbb-3(b)(1), unless the authorization is terminated or revoked sooner.     Influenza A by PCR NEGATIVE NEGATIVE Final   Influenza B by PCR NEGATIVE NEGATIVE Final    Comment: (NOTE) The Xpert Xpress SARS-CoV-2/FLU/RSV plus assay is intended as an aid in the diagnosis of influenza from Nasopharyngeal swab specimens and should not be used as a sole basis for treatment. Nasal washings and aspirates are unacceptable for Xpert Xpress SARS-CoV-2/FLU/RSV testing.  Fact Sheet for Patients: EntrepreneurPulse.com.au  Fact Sheet for Healthcare Providers: IncredibleEmployment.be  This test is not yet approved or cleared by the [place] FDA and has been authorized for detection and/or diagnosis of SARS-CoV-2 by FDA under an Emergency Use Authorization (EUA). This EUA will remain in effect (meaning this test can be used) for the duration of the COVID-19 declaration under Section 564(b)(1) of the Act, 21 U.S.C. section 360bbb-3(b)(1), unless the authorization is terminated or revoked.  Performed at [facility], [address] [place], [place] [address]   Urine Culture     Status: Abnormal   Collection Time: 02/25/21  6:26 PM   Specimen: Urine, Clean Catch  Result Value Ref Range Status   Specimen Description   Final    URINE, CLEAN CATCH Performed at [facility], [address] [place], [place] [address]    Special Requests   Final    NONE Performed at [facility], [address], [place], [place] [address]    Culture (A)  Final    <  10,000 COLONIES/mL INSIGNIFICANT GROWTH Performed at [facility], [place] [address] [place], [place] [address]    Report Status 02/27/2021 FINAL  Final  MRSA Next Gen by PCR, Nasal     Status: None   Collection Time: 02/25/21  9:21 PM   Specimen: Nasal Mucosa; Nasal Swab  Result Value Ref Range Status   MRSA by PCR Next Gen NOT DETECTED NOT DETECTED Final    Comment: (NOTE) The GeneXpert MRSA Assay  (FDA approved for NASAL specimens only), is one component of a comprehensive MRSA colonization surveillance program. It is not intended to diagnose MRSA infection nor to guide or monitor treatment for MRSA infections. Test performance is not FDA approved in patients less than 66 years old. Performed at [facility], [address] [place], [place] [address]      Labs: BNP (last 3 results) No results for input(s): BNP in the last 8760 hours. Basic Metabolic Panel: Recent Labs  Lab 02/25/21 2114 02/26/21 0139 02/26/21 0430 02/26/21 0828 02/27/21 0419  NA 136 138 138 139 135  K 4.3 4.0 4.1 3.7 3.9  CL 105 106 106 108 104  CO2 19* 23 22 23 23   GLUCOSE 359* 224* 229* 139* 297*  BUN 87* 85* 85* 85* 70*  CREATININE 2.28* 2.34* 2.31* 2.41* 1.86*  CALCIUM 9.0 9.0 9.1 8.7* 8.5*  MG  --   --  2.6*  --  2.3   Liver Function Tests: Recent Labs  Lab 02/25/21 1516  AST 13*  ALT 9  ALKPHOS 102  BILITOT 1.1  PROT 8.1  ALBUMIN 3.3*   Recent Labs  Lab 02/25/21 1516 02/26/21 0430  LIPASE 280* 125*   No results for input(s): AMMONIA in the last 168 hours. CBC: Recent Labs  Lab 02/25/21 1516 02/26/21 0430 02/27/21 0419  WBC 13.3* 15.9* 9.8  NEUTROABS 11.2*  --   --   HGB 13.7 10.2* 11.5*  HCT 41.0 30.1* 34.3*  MCV 93.4 92.6 93.5  PLT 429* 379 270   Cardiac Enzymes: Recent Labs  Lab 02/25/21 1615  CKTOTAL 24*   BNP: Invalid input(s): POCBNP CBG: Recent Labs  Lab 02/26/21 2015 02/26/21 2332 02/27/21 0449 02/27/21 0721 02/27/21 1132  GLUCAP 245* 290* 297* 275* 298*   D-Dimer No results for input(s): DDIMER in the last 72 hours. Hgb A1c Recent Labs    02/25/21 1516  HGBA1C >15.5*   Lipid Profile No results for input(s): CHOL, HDL, LDLCALC, TRIG, CHOLHDL, LDLDIRECT in the last 72 hours. Thyroid function studies No results for input(s): TSH, T4TOTAL, T3FREE, THYROIDAB in the last 72 hours.  Invalid input(s): FREET3 Anemia work up No results for  input(s): VITAMINB12, FOLATE, FERRITIN, TIBC, IRON, RETICCTPCT in the last 72 hours. Urinalysis    Component Value Date/Time   COLORURINE YELLOW 02/25/2021 1700   APPEARANCEUR TURBID (A) 02/25/2021 1700   LABSPEC 1.014 02/25/2021 1700   PHURINE 5.0 02/25/2021 1700   GLUCOSEU >=500 (A) 02/25/2021 1700   HGBUR MODERATE (A) 02/25/2021 1700   BILIRUBINUR NEGATIVE 02/25/2021 1700   KETONESUR 5 (A) 02/25/2021 1700   PROTEINUR 30 (A) 02/25/2021 1700   NITRITE NEGATIVE 02/25/2021 1700   LEUKOCYTESUR LARGE (A) 02/25/2021 1700   Sepsis Labs Invalid input(s): PROCALCITONIN,  WBC,  LACTICIDVEN Microbiology Recent Results (from the past 240 hour(s))  Resp Panel by RT-PCR (Flu A&B, Covid)     Status: Abnormal   Collection Time: 02/25/21  3:29 PM   Specimen: Nasopharyngeal(NP) swabs in vial transport medium  Result  Value Ref Range Status   SARS Coronavirus 2 by RT PCR POSITIVE (A) NEGATIVE Final    Comment: CRITICAL RESULT CALLED TO, READ BACK BY AND VERIFIED WITH: HOLCOMB,R AT 6045 ON 8.29.22 BY RUCINSKI,B (NOTE) SARS-CoV-2 target nucleic acids are DETECTED.  The SARS-CoV-2 RNA is generally detectable in upper respiratory specimens during the acute phase of infection. Positive results are indicative of the presence of the identified virus, but do not rule out bacterial infection or co-infection with other pathogens not detected by the test. Clinical correlation with patient history and other diagnostic information is necessary to determine patient infection status. The expected result is Negative.  Fact Sheet for Patients: EntrepreneurPulse.com.au  Fact Sheet for Healthcare Providers: IncredibleEmployment.be  This test is not yet approved or cleared by the [place] FDA and  has been authorized for detection and/or diagnosis of SARS-CoV-2 by FDA under an Emergency Use Authorization (EUA).  This EUA will remain in effect (meanin g this test can  be used) for the duration of  the COVID-19 declaration under Section 564(b)(1) of the Act, 21 U.S.C. section 360bbb-3(b)(1), unless the authorization is terminated or revoked sooner.     Influenza A by PCR NEGATIVE NEGATIVE Final   Influenza B by PCR NEGATIVE NEGATIVE Final    Comment: (NOTE) The Xpert Xpress SARS-CoV-2/FLU/RSV plus assay is intended as an aid in the diagnosis of influenza from Nasopharyngeal swab specimens and should not be used as a sole basis for treatment. Nasal washings and aspirates are unacceptable for Xpert Xpress SARS-CoV-2/FLU/RSV testing.  Fact Sheet for Patients: EntrepreneurPulse.com.au  Fact Sheet for Healthcare Providers: IncredibleEmployment.be  This test is not yet approved or cleared by the [place] FDA and has been authorized for detection and/or diagnosis of SARS-CoV-2 by FDA under an Emergency Use Authorization (EUA). This EUA will remain in effect (meaning this test can be used) for the duration of the COVID-19 declaration under Section 564(b)(1) of the Act, 21 U.S.C. section 360bbb-3(b)(1), unless the authorization is terminated or revoked.  Performed at [facility], [address], [place], [place] [address]   Urine Culture     Status: Abnormal   Collection Time: 02/25/21  6:26 PM   Specimen: Urine, Clean Catch  Result Value Ref Range Status   Specimen Description   Final    URINE, CLEAN CATCH Performed at [facility], [address], [place], [place] [address]    Special Requests   Final    NONE Performed at [facility], [address] [place], [place] [address]    Culture (A)  Final    <10,000 COLONIES/mL INSIGNIFICANT GROWTH Performed at [facility], [place] [address] [place], [place] [address]    Report Status 02/27/2021 FINAL  Final  MRSA Next Gen by PCR, Nasal     Status: None   Collection Time: 02/25/21  9:21 PM   Specimen: Nasal Mucosa; Nasal Swab  Result Value Ref Range  Status   MRSA by PCR Next Gen NOT DETECTED NOT DETECTED Final    Comment: (NOTE) The GeneXpert MRSA Assay (FDA approved for NASAL specimens only), is one component of a comprehensive MRSA colonization surveillance program. It is not intended to diagnose MRSA infection nor to guide or monitor treatment for MRSA infections. Test performance is not FDA approved in patients less than 58 years old. Performed at [facility], [address], [place], [place] [address]    Time coordinating discharge: 38 mins   SIGNED:  Irwin Brakeman, MD  [facility] 02/27/2021, 1:48 PM How to contact the [facility] Attending or Consulting provider [place] or covering provider during after hours [place], for this patient?  Check the care team in [facility] and look for a) attending/consulting [facility] provider listed and b) the [facility] team listed Log into [email or web address] and use [place]'s universal password to access. If you do not have the password, please contact the hospital operator. Locate the [facility] provider you are looking for under [facility] and page to a number that you can be directly reached. If you still have difficulty reaching the provider, please page the [facility] (Director on Call) for the Hospitalists listed on amion for assistance.

## 2021-02-27 NOTE — Care Management Important Message (Signed)
Important Message  Patient Details  Name: Stanley Levy MRN: [id number] Date of Birth: [birth year]   Medicare Important Message Given:  Yes - Important Message mailed due to current National Emergency     Corey Harold 02/27/2021, 2:03 PM

## 2021-02-27 NOTE — TOC Progression Note (Signed)
Transition of Care [facility]) - Progression Note    Patient Details  Name: Stanley Levy MRN: [id number] Date of Birth: 04/02/1956  Transition of Care [facility]) CM/SW Contact  Armanda Heritage, RN Phone Number: 02/27/2021, 2:02 PM  Clinical Narrative:    Referral for HHPT/RN/SW given to [facility] rep Kandee Keen.     Expected Discharge Plan: Home w Home Health Services Barriers to Discharge: Continued Medical Work up  Expected Discharge Plan and Services Expected Discharge Plan: Home w Home Health Services   Discharge Planning Services: CM Consult Post Acute Care Choice: Home Health Living arrangements for the past 2 months: Single Family Home Expected Discharge Date: 02/27/21                         HH Arranged: RN, PT, Social Work HH Agency: [facility] Date [facility] Agency Contacted: 02/27/21 Time HH Agency Contacted: 1401 Representative spoke with at [facility] Agency: Kandee Keen   Social Determinants of Health (SDOH) Interventions    Readmission Risk Interventions Readmission Risk Prevention Plan 11/06/2020 09/22/2019 08/02/2019  Post Dischage Appt - - Complete  Medication Screening - - Complete  Transportation Screening Complete Complete Complete  PCP or Specialist Appt within 3-5 Days - Complete -  HRI or Home Care Consult Complete Complete -  Social Work Consult for Recovery Care Planning/Counseling Complete Complete -  Palliative Care Screening Not Applicable Complete -  Medication Review Oceanographer) Complete Complete -  Some recent data might be hidden

## 2021-02-28 LAB — BASIC METABOLIC PANEL
Anion gap: 6 (ref 5–15)
BUN: 57 mg/dL — ABNORMAL HIGH (ref 8–23)
CO2: 25 mmol/L (ref 22–32)
Calcium: 8.4 mg/dL — ABNORMAL LOW (ref 8.9–10.3)
Chloride: 104 mmol/L (ref 98–111)
Creatinine, Ser: 1.55 mg/dL — ABNORMAL HIGH (ref 0.61–1.24)
GFR, Estimated: 49 mL/min — ABNORMAL LOW (ref >60)
Glucose, Bld: 251 mg/dL — ABNORMAL HIGH (ref 70–99)
Potassium: 4.6 mmol/L (ref 3.5–5.1)
Sodium: 135 mmol/L (ref 135–145)

## 2021-02-28 LAB — GLUCOSE, CAPILLARY
Glucose-Capillary: 210 mg/dL — ABNORMAL HIGH (ref 70–99)
Glucose-Capillary: 238 mg/dL — ABNORMAL HIGH (ref 70–99)
Glucose-Capillary: 366 mg/dL — ABNORMAL HIGH (ref 70–99)
Glucose-Capillary: 192 mg/dL — ABNORMAL HIGH (ref 70–99)
Glucose-Capillary: 146 mg/dL — ABNORMAL HIGH (ref 70–99)

## 2021-02-28 MED ORDER — INSULIN ASPART 100 UNIT/ML IJ SOLN
12.0000 [IU] | Freq: Three times a day (TID) | INTRAMUSCULAR | Status: DC
  Administered 2021-02-28 – 2021-03-01 (×6): 12 [IU] via SUBCUTANEOUS

## 2021-02-28 MED ORDER — INSULIN GLARGINE-YFGN 100 UNIT/ML ~~LOC~~ SOLN
34.0000 [IU] | Freq: Every day | SUBCUTANEOUS | Status: DC
  Administered 2021-02-28 – 2021-03-01 (×2): 34 [IU] via SUBCUTANEOUS
  Filled 2021-02-28 (×5): qty 0.34

## 2021-02-28 MED ORDER — INSULIN STARTER KIT- PEN NEEDLES (ENGLISH)
1.0000 | Freq: Once | Status: DC
  Filled 2021-02-28: qty 1

## 2021-02-28 NOTE — Progress Notes (Signed)
Inpatient Diabetes Program Recommendations  AACE/ADA: New Consensus Statement on Inpatient Glycemic Control   Target Ranges:  Prepandial:   less than 140 mg/dL      Peak postprandial:   less than 180 mg/dL (1-2 hours)      Critically ill patients:  140 - 180 mg/dL   Results for Stanley Levy, Stanley Levy (MRN [id number]) as of 02/28/2021 13:28  Ref. Range 02/27/2021 07:21 02/27/2021 11:32 02/27/2021 17:05 02/27/2021 21:28 02/28/2021 03:06 02/28/2021 07:28 02/28/2021 11:18  Glucose-Capillary Latest Ref Range: 70 - 99 mg/dL 275 (H) 298 (H) 72 176 (H) 210 (H) 238 (H) 366 (H)    Review of Glycemic Control Diabetes history: DM2 Outpatient Diabetes medications: Farxiga 10 mg QAM, Metformin XR 500 mg QAM, 70/30 24 units BID Current orders for Inpatient glycemic control: Semglee 34 units daily, Novolog 12 units TID with meals, Novolog 0-20 units TID with meals, Novolog 0-5 units QHS  NOTE: Spoke with patient at bedside regarding insulin pen education. Patient reports that he has used insulin pens and insulin vial/syringe at home. He notes that his wife got everything ready with the insulin for him and then he would self administer the insulin. Asked patient to put on his glasses so he would be able to see better.  Provided demonstration insulin pen, pen needle, and injection dome to patient. Asked patient to demonstrate how to use it. Patient did not know how to get started. Educated patient on how to use an insulin pen.  Reviewed all steps of insulin pen including attachment of needle, 2-unit air shot, dialing up dose, giving injection, removing needle, disposal of sharps, storage of unused insulin, disposal of insulin etc. Asked patient to return demonstrate and patient required verbal cues on proper steps. Had patient demonstrate all steps of using an insulin pen 5 times total. Patient was able to perform the last 2 times correctly without verbal cues. Patient will continue to need reinforcement from bedside nursing to ensure  he is able to properly self administer insulin. Anticipate patient will need someone to help him at home with insulin administration at first until he gets in a routine of taking it himself everyday. Reviewed glucose goals. Reviewed verbal steps of how to check glucose as patient stated his wife got his machine ready for him. Discussed current A1C of >15.5% and importance of getting DM under better control. Stressed how complications from uncontrolled DM occur and explained that his DM needed to be better controlled to decrease his risk of developing further complications. Patient states he has all DM medications, insulin, and DM supplies at home.   Patient verbalized understanding of information discussed and states he has no questions at this time. Discussed patient with Markus Daft, RN and [employer], Therapist, sports.  Ordered an insulin pen starter kit for patient to have for further education and reminders of each step of how to administer insulin. Bedside Nursing: Please continue to work with patient on insulin administration and allow patient to self administer all insulin injections.  Thanks, Barnie Alderman, RN, MSN, CDE Diabetes Coordinator Inpatient Diabetes Program [phone] (Team Pager from 8am to 5pm)

## 2021-02-28 NOTE — TOC Progression Note (Signed)
Transition of Care [facility]) - Progression Note    Patient Details  Name: Stanley Levy MRN: [id number] Date of Birth: January 28, 1956  Transition of Care [facility]) CM/SW Contact  Annice Needy, LCSW Phone Number: 02/28/2021, 4:05 PM  Clinical Narrative:    Eunice Blase at [facility] can make a bed offer as facility currently has a male bed that can be isolated.  Debbie to start authorization.   Expected Discharge Plan: Home w Home Health Services Barriers to Discharge: Continued Medical Work up  Expected Discharge Plan and Services Expected Discharge Plan: Home w Home Health Services   Discharge Planning Services: CM Consult Post Acute Care Choice: Home Health Living arrangements for the past 2 months: Single Family Home Expected Discharge Date: 02/27/21                         HH Arranged: RN, PT, Social Work HH Agency: [facility] Date [facility] Agency Contacted: 02/27/21 Time HH Agency Contacted: 1401 Representative spoke with at [facility] Agency: Kandee Keen   Social Determinants of Health (SDOH) Interventions    Readmission Risk Interventions Readmission Risk Prevention Plan 11/06/2020 09/22/2019 08/02/2019  Post Dischage Appt - - Complete  Medication Screening - - Complete  Transportation Screening Complete Complete Complete  PCP or Specialist Appt within 3-5 Days - Complete -  HRI or Home Care Consult Complete Complete -  Social Work Consult for Recovery Care Planning/Counseling Complete Complete -  Palliative Care Screening Not Applicable Complete -  Medication Review Oceanographer) Complete Complete -  Some recent data might be hidden

## 2021-02-28 NOTE — Progress Notes (Signed)
Physical Therapy Treatment Patient Details Name: Stanley Levy MRN: [id number] DOB: 10-Nov-1955 Today's Date: 02/28/2021    History of Present Illness Stanley Levy is a 65 y.o. male who was noted by EMS to have hyperglycemia; pt positive for covid19. No CT evidence for acute intracranial abnormality. 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  Patient put back to bed after therapy.  Patient will benefit from continued physical therapy in hospital and recommended venue below to increase strength, balance, endurance for safe ADLs and gait.     Follow Up Recommendations  SNF;Supervision for mobility/OOB;Supervision - Intermittent     Equipment Recommendations  Rolling walker with 5" wheels    Recommendations for Other Services       Precautions / Restrictions Precautions Precautions: Fall Restrictions Weight Bearing Restrictions: No    Mobility  Bed Mobility Overal bed mobility: Needs Assistance Bed Mobility: Supine to Sit;Sit to Supine     Supine to sit: Min guard Sit to supine: Supervision   General bed mobility comments: slightly labored movement    Transfers Overall transfer level: Needs assistance Equipment used: 1 person hand held assist Transfers: Sit to/from [employer] Sit to Stand: Min guard Stand pivot transfers: Min guard       General transfer comment: slightly unsteady requiring hand held assist for safety  Ambulation/Gait Ambulation/Gait assistance: Min guard;Min assist Gait Distance (Feet): 40 Feet Assistive device: 1 person hand held assist Gait Pattern/deviations:  Decreased step length - right;Decreased step length - left;Decreased stride length;Narrow base of support;Staggering left;Staggering right Gait velocity: decreased   General Gait Details: slightly unsteady cadence with narrow base of support, occasional staggering left/right with near loss of balance, limited mostly due to fatigue   Stairs             Wheelchair Mobility    Modified Rankin (Stroke Patients Only)       Balance Overall balance assessment: Needs assistance Sitting-balance support: Feet supported;No upper extremity supported Sitting balance-Leahy Scale: Good     Standing balance support: During functional activity;No upper extremity supported Standing balance-Leahy Scale: Poor Standing balance comment: fair/poor without AD, fair with hand held assist                            Cognition Arousal/Alertness: Awake/alert Behavior During Therapy: WFL for tasks assessed/performed Overall Cognitive Status: Within Functional Limits for tasks assessed                                        Exercises      General Comments        Pertinent Vitals/Pain Pain Assessment: No/denies pain    Home Living                      Prior Function            PT Goals (current goals can now be found in the care plan section) Acute Rehab PT Goals Patient Stated Goal: return home after rehab PT Goal  Formulation: With patient Time For Goal Achievement: 03/12/21 Potential to Achieve Goals: Good Progress towards PT goals: Progressing toward goals    Frequency    Min 3X/week      PT Plan Discharge plan needs to be updated    Co-evaluation              AM-PAC PT "6 Clicks" Mobility   Outcome Measure  Help needed turning from your back to your side while in a flat bed without using bedrails?: None Help needed moving from lying on your back to sitting on the side of a flat bed without using bedrails?: A Little Help  needed moving to and from a bed to a chair (including a wheelchair)?: A Little Help needed standing up from a chair using your arms (e.g., wheelchair or bedside chair)?: A Little Help needed to walk in hospital room?: A Little Help needed climbing 3-5 steps with a railing? : A Lot 6 Click Score: 18    End of Session   Activity Tolerance: Patient tolerated treatment well;Patient limited by fatigue Patient left: in bed;with call bell/phone within reach Nurse Communication: Mobility status PT Visit Diagnosis: Unsteadiness on feet (R26.81);Other abnormalities of gait and mobility (R26.89);Muscle weakness (generalized) (M62.81)     Time: 7858-8502 PT Time Calculation (min) (ACUTE ONLY): 26 min  Charges:  $Gait Training: 8-22 mins $Therapeutic Activity: 8-22 mins                     10:47 AM, 02/28/21 Ocie Bob, MPT Physical Therapist with [facility] [phone] office [phone] mobile phone

## 2021-02-28 NOTE — Progress Notes (Signed)
02/28/2021 11:39 AM  Progress:   Pt was discharged 02/27/21 and currently awaiting for SNF placement or safe discharge placement to home.   Social workers are working on his placement at this time.    Maryln Manuel, MD  How to contact the [facility] Attending or Consulting provider 7A - 7P or covering provider during after hours 7P -7A, for this patient?  Check the care team in [facility] and look for a) attending/consulting TRH provider listed and b) the [facility] team listed Log into [email or web address] and use 's universal password to access. If you do not have the password, please contact the hospital operator. Locate the [facility] provider you are looking for under [facility] and page to a number that you can be directly reached. If you still have difficulty reaching the provider, please page the [facility] (Director on Call) for the Hospitalists listed on amion for assistance.

## 2021-03-01 LAB — GLUCOSE, CAPILLARY
Glucose-Capillary: 130 mg/dL — ABNORMAL HIGH (ref 70–99)
Glucose-Capillary: 143 mg/dL — ABNORMAL HIGH (ref 70–99)
Glucose-Capillary: 307 mg/dL — ABNORMAL HIGH (ref 70–99)
Glucose-Capillary: 330 mg/dL — ABNORMAL HIGH (ref 70–99)
Glucose-Capillary: 157 mg/dL — ABNORMAL HIGH (ref 70–99)

## 2021-03-01 LAB — BASIC METABOLIC PANEL
Anion gap: 8 (ref 5–15)
BUN: 44 mg/dL — ABNORMAL HIGH (ref 8–23)
CO2: 24 mmol/L (ref 22–32)
Calcium: 8.2 mg/dL — ABNORMAL LOW (ref 8.9–10.3)
Chloride: 101 mmol/L (ref 98–111)
Creatinine, Ser: 1.5 mg/dL — ABNORMAL HIGH (ref 0.61–1.24)
GFR, Estimated: 51 mL/min — ABNORMAL LOW (ref >60)
Glucose, Bld: 138 mg/dL — ABNORMAL HIGH (ref 70–99)
Potassium: 4.1 mmol/L (ref 3.5–5.1)
Sodium: 133 mmol/L — ABNORMAL LOW (ref 135–145)

## 2021-03-01 MED ORDER — INSULIN ASPART 100 UNIT/ML IJ SOLN
14.0000 [IU] | Freq: Three times a day (TID) | INTRAMUSCULAR | Status: DC
  Administered 2021-03-02 (×3): 14 [IU] via SUBCUTANEOUS

## 2021-03-01 MED ORDER — INSULIN GLARGINE-YFGN 100 UNIT/ML ~~LOC~~ SOLN
38.0000 [IU] | Freq: Every day | SUBCUTANEOUS | Status: DC
  Administered 2021-03-02: 38 [IU] via SUBCUTANEOUS
  Filled 2021-03-01 (×3): qty 0.38

## 2021-03-01 NOTE — Progress Notes (Signed)
Physical Therapy Treatment Patient Details Name: Stanley Levy MRN: [id number] DOB: December 25, 1955 Today's Date: 03/01/2021    History of Present Illness Stanley Levy is a 65 y.o. male who was noted by EMS to have hyperglycemia; pt positive for covid19. No CT evidence for acute intracranial abnormality. 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  Patient tolerated sitting up in chair after therapy - nursing staff aware.  Patient will benefit from continued physical therapy in hospital and recommended venue below to increase strength, balance, endurance for safe ADLs and gait.    Follow Up Recommendations  SNF;Supervision for mobility/OOB;Supervision - Intermittent     Equipment Recommendations  Rolling walker with 5" wheels    Recommendations for Other Services       Precautions / Restrictions Precautions Precautions: Fall Restrictions Weight Bearing Restrictions: No    Mobility  Bed Mobility Overal bed mobility: Needs Assistance Bed Mobility: Supine to Sit     Supine to sit: Supervision     General bed mobility comments: increased time, labored movement    Transfers Overall transfer level: Needs assistance Equipment used: Rolling walker (2 wheeled);None Transfers: Sit to/from [employer] to Stand: Min assist Stand pivot transfers: Min guard       General transfer comment: had loss of balance and fell back onto bed during sit to stand without AD, required use of RW for safety  Ambulation/Gait Ambulation/Gait assistance: Min guard;Min  assist Gait Distance (Feet): 45 Feet Assistive device: Rolling walker (2 wheeled) Gait Pattern/deviations: Decreased step length - right;Decreased step length - left;Decreased stride length;Narrow base of support;Steppage Gait velocity: decreased   General Gait Details: slow labored cadence with narrow base of support, excessive external rotation RLE with decreased dorsiflexion and limited mostly due to fatigue and c/o BLE weakness   Stairs             Wheelchair Mobility    Modified Rankin (Stroke Patients Only)       Balance Overall balance assessment: Needs assistance Sitting-balance support: Feet supported;No upper extremity supported Sitting balance-Leahy Scale: Good Sitting balance - Comments: seated EOB   Standing balance support: During functional activity;No upper extremity supported Standing balance-Leahy Scale: Poor Standing balance comment: fair using RW                            Cognition Arousal/Alertness: Awake/alert Behavior During Therapy: WFL for tasks assessed/performed Overall Cognitive Status: Within Functional Limits for tasks assessed                                        Exercises General Exercises - Lower Extremity Long Arc Quad: Seated;AROM;Strengthening;Both;20 reps Hip Flexion/Marching: Seated;AROM;Strengthening;Both;20 reps Toe Raises: Seated;AROM;Strengthening;Both;20 reps Heel Raises: Seated;AROM;Strengthening;Both;20 reps    General Comments        Pertinent Vitals/Pain Pain Assessment: No/denies pain    Home Living  Prior Function            PT Goals (current goals can now be found in the care plan section) Acute Rehab PT Goals Patient Stated Goal: return home after rehab PT Goal Formulation: With patient Time For Goal Achievement: 03/12/21 Potential to Achieve Goals: Good Progress towards PT goals: Progressing toward goals    Frequency    Min  3X/week      PT Plan Current plan remains appropriate    Co-evaluation              AM-PAC PT "6 Clicks" Mobility   Outcome Measure  Help needed turning from your back to your side while in a flat bed without using bedrails?: None Help needed moving from lying on your back to sitting on the side of a flat bed without using bedrails?: A Little Help needed moving to and from a bed to a chair (including a wheelchair)?: A Little Help needed standing up from a chair using your arms (e.g., wheelchair or bedside chair)?: A Little Help needed to walk in hospital room?: A Little Help needed climbing 3-5 steps with a railing? : A Lot 6 Click Score: 18    End of Session   Activity Tolerance: Patient tolerated treatment well;Patient limited by fatigue Patient left: in chair;with call bell/phone within reach;with chair alarm set Nurse Communication: Mobility status PT Visit Diagnosis: Unsteadiness on feet (R26.81);Other abnormalities of gait and mobility (R26.89);Muscle weakness (generalized) (M62.81)     Time: 0258-5277 PT Time Calculation (min) (ACUTE ONLY): 28 min  Charges:  $Gait Training: 8-22 mins $Therapeutic Exercise: 8-22 mins                     3:28 PM, 03/01/21 Ocie Bob, MPT Physical Therapist with [facility] [phone] office [phone] mobile phone

## 2021-03-01 NOTE — Progress Notes (Signed)
03/01/2021 3:36 PM  Progress:   Pt was discharged 02/27/21 and currently awaiting for SNF placement or safe discharge placement to home.   Social workers are working on his placement at this time.   Pt remains stable to discharge SNF.   Maryln Manuel, MD  How to contact the [facility] Attending or Consulting provider 7A - 7P or covering provider during after hours 7P -7A, for this patient?  Check the care team in [facility] and look for a) attending/consulting TRH provider listed and b) the [facility] team listed Log into [email or web address] and use [place]'s universal password to access. If you do not have the password, please contact the hospital operator. Locate the [facility] provider you are looking for under [facility] and page to a number that you can be directly reached. If you still have difficulty reaching the provider, please page the [facility] (Director on Call) for the Hospitalists listed on amion for assistance.

## 2021-03-01 NOTE — Progress Notes (Signed)
Received a call from [place] Day with [facility]. She wanted to know if she could speak to patient on the phone regarding plans for his deceased wife. Patient is slow to respond and at times has to be asked the same question several times. I advised Jearl Klinefelter we would not be able to receive an email for him to electronically sign and I didn't feel comfortable relaying information on his behalf. Patient is aware of situation and did state he wanted his wife to be cremated. Wilkerson plans to get in touch with patient again Arneshia Ade next week to see if his situation has improved.

## 2021-03-02 LAB — BASIC METABOLIC PANEL
Anion gap: 9 (ref 5–15)
BUN: 36 mg/dL — ABNORMAL HIGH (ref 8–23)
CO2: 25 mmol/L (ref 22–32)
Calcium: 8.4 mg/dL — ABNORMAL LOW (ref 8.9–10.3)
Chloride: 101 mmol/L (ref 98–111)
Creatinine, Ser: 1.29 mg/dL — ABNORMAL HIGH (ref 0.61–1.24)
GFR, Estimated: >60 mL/min (ref >60)
Glucose, Bld: 205 mg/dL — ABNORMAL HIGH (ref 70–99)
Potassium: 4.3 mmol/L (ref 3.5–5.1)
Sodium: 135 mmol/L (ref 135–145)

## 2021-03-02 LAB — GLUCOSE, CAPILLARY
Glucose-Capillary: 154 mg/dL — ABNORMAL HIGH (ref 70–99)
Glucose-Capillary: 206 mg/dL — ABNORMAL HIGH (ref 70–99)
Glucose-Capillary: 243 mg/dL — ABNORMAL HIGH (ref 70–99)
Glucose-Capillary: 205 mg/dL — ABNORMAL HIGH (ref 70–99)
Glucose-Capillary: 214 mg/dL — ABNORMAL HIGH (ref 70–99)

## 2021-03-02 MED ORDER — INSULIN GLARGINE-YFGN 100 UNIT/ML ~~LOC~~ SOLN
40.0000 [IU] | Freq: Every day | SUBCUTANEOUS | Status: DC
  Administered 2021-03-03 – 2021-03-04 (×2): 40 [IU] via SUBCUTANEOUS
  Filled 2021-03-02 (×3): qty 0.4

## 2021-03-02 NOTE — Progress Notes (Signed)
03/02/2021 1:01 PM  Progress:   Pt was discharged 02/27/21 and currently awaiting for SNF placement or safe discharge placement to home.   Social workers are working on his placement at this time.   Pt remains stable to discharge SNF.   See dictated discharge summary.  Awaiting insurance authorization.   Maryln Manuel, MD  How to contact the [facility] Attending or Consulting provider 7A - 7P or covering provider during after hours 7P -7A, for this patient?  Check the care team in [facility] and look for a) attending/consulting TRH provider listed and b) the [facility] team listed Log into [email or web address] and use [place]'s universal password to access. If you do not have the password, please contact the hospital operator. Locate the [facility] provider you are looking for under [facility] and page to a number that you can be directly reached. If you still have difficulty reaching the provider, please page the [facility] (Director on Call) for the Hospitalists listed on amion for assistance.

## 2021-03-03 LAB — GLUCOSE, CAPILLARY
Glucose-Capillary: 137 mg/dL — ABNORMAL HIGH (ref 70–99)
Glucose-Capillary: 144 mg/dL — ABNORMAL HIGH (ref 70–99)
Glucose-Capillary: 244 mg/dL — ABNORMAL HIGH (ref 70–99)
Glucose-Capillary: 161 mg/dL — ABNORMAL HIGH (ref 70–99)
Glucose-Capillary: 201 mg/dL — ABNORMAL HIGH (ref 70–99)

## 2021-03-03 MED ORDER — INSULIN ASPART 100 UNIT/ML IJ SOLN
15.0000 [IU] | Freq: Three times a day (TID) | INTRAMUSCULAR | Status: DC
  Administered 2021-03-03 – 2021-03-04 (×6): 15 [IU] via SUBCUTANEOUS

## 2021-03-03 NOTE — TOC Progression Note (Signed)
Transition of Care [facility]) - Progression Note    Patient Details  Name: Stanley Levy MRN: [id number] Date of Birth: 25-Oct-1955  Transition of Care [facility]) CM/SW Contact  Barry Brunner, LCSW Phone Number: 03/03/2021, 9:24 AM  Clinical Narrative:    CSW contacted Debbie with [facility] for admissions. Debbie reported that she is not able to take patient today due an issue with facility exhaust system. Rooms designated for Covid + patients have been closed off. TOC to follow.   Expected Discharge Plan: Home w Home Health Services Barriers to Discharge: Continued Medical Work up  Expected Discharge Plan and Services Expected Discharge Plan: Home w Home Health Services   Discharge Planning Services: CM Consult Post Acute Care Choice: Home Health Living arrangements for the past 2 months: Single Family Home Expected Discharge Date: 02/27/21                         HH Arranged: RN, PT, Social Work HH Agency: [facility] Date [facility] Agency Contacted: 02/27/21 Time HH Agency Contacted: 1401 Representative spoke with at [facility] Agency: Kandee Keen   Social Determinants of Health (SDOH) Interventions    Readmission Risk Interventions Readmission Risk Prevention Plan 11/06/2020 09/22/2019 08/02/2019  Post Dischage Appt - - Complete  Medication Screening - - Complete  Transportation Screening Complete Complete Complete  PCP or Specialist Appt within 3-5 Days - Complete -  HRI or Home Care Consult Complete Complete -  Social Work Consult for Recovery Care Planning/Counseling Complete Complete -  Palliative Care Screening Not Applicable Complete -  Medication Review Oceanographer) Complete Complete -  Some recent data might be hidden

## 2021-03-03 NOTE — Progress Notes (Signed)
03/03/2021 12:43 PM  Progress:   Pt was discharged 02/27/21 and currently awaiting for SNF placement or safe discharge placement to home.   Social workers are working on his placement at this time.   Pt remains stable to discharge SNF.   See dictated discharge summary.  Now facility can't take patient today due to a reported "facility exhaust system malfunction" where rooms have been closed off.  TOC to update as more info available.    Stanley Manuel, MD  How to contact the [facility] Attending or Consulting provider 7A - 7P or covering provider during after hours 7P -7A, for this patient?  Check the care team in [facility] and look for a) attending/consulting TRH provider listed and b) the [facility] team listed Log into [email or web address] and use [place]'s universal password to access. If you do not have the password, please contact the hospital operator. Locate the [facility] provider you are looking for under [facility] and page to a number that you can be directly reached. If you still have difficulty reaching the provider, please page the [facility] (Director on Call) for the Hospitalists listed on amion for assistance.

## 2021-03-04 DIAGNOSIS — I501 Left ventricular failure: Secondary | ICD-10-CM | POA: Diagnosis not present

## 2021-03-04 DIAGNOSIS — Z794 Long term (current) use of insulin: Secondary | ICD-10-CM | POA: Diagnosis not present

## 2021-03-04 DIAGNOSIS — E039 Hypothyroidism, unspecified: Secondary | ICD-10-CM | POA: Diagnosis not present

## 2021-03-04 DIAGNOSIS — M792 Neuralgia and neuritis, unspecified: Secondary | ICD-10-CM | POA: Diagnosis not present

## 2021-03-04 DIAGNOSIS — I10 Essential (primary) hypertension: Secondary | ICD-10-CM | POA: Diagnosis not present

## 2021-03-04 DIAGNOSIS — R404 Transient alteration of awareness: Secondary | ICD-10-CM | POA: Diagnosis not present

## 2021-03-04 DIAGNOSIS — E1110 Type 2 diabetes mellitus with ketoacidosis without coma: Secondary | ICD-10-CM | POA: Diagnosis not present

## 2021-03-04 DIAGNOSIS — D649 Anemia, unspecified: Secondary | ICD-10-CM | POA: Diagnosis not present

## 2021-03-04 DIAGNOSIS — I469 Cardiac arrest, cause unspecified: Secondary | ICD-10-CM | POA: Diagnosis not present

## 2021-03-04 DIAGNOSIS — R4020 Unspecified coma: Secondary | ICD-10-CM | POA: Diagnosis not present

## 2021-03-04 DIAGNOSIS — I5042 Chronic combined systolic (congestive) and diastolic (congestive) heart failure: Secondary | ICD-10-CM | POA: Diagnosis not present

## 2021-03-04 DIAGNOSIS — R2689 Other abnormalities of gait and mobility: Secondary | ICD-10-CM | POA: Diagnosis not present

## 2021-03-04 DIAGNOSIS — F32A Depression, unspecified: Secondary | ICD-10-CM | POA: Diagnosis not present

## 2021-03-04 DIAGNOSIS — N400 Enlarged prostate without lower urinary tract symptoms: Secondary | ICD-10-CM | POA: Diagnosis not present

## 2021-03-04 DIAGNOSIS — G9341 Metabolic encephalopathy: Secondary | ICD-10-CM | POA: Diagnosis not present

## 2021-03-04 DIAGNOSIS — E785 Hyperlipidemia, unspecified: Secondary | ICD-10-CM | POA: Diagnosis not present

## 2021-03-04 DIAGNOSIS — E113293 Type 2 diabetes mellitus with mild nonproliferative diabetic retinopathy without macular edema, bilateral: Secondary | ICD-10-CM | POA: Diagnosis not present

## 2021-03-04 DIAGNOSIS — R5381 Other malaise: Secondary | ICD-10-CM | POA: Diagnosis not present

## 2021-03-04 DIAGNOSIS — E1169 Type 2 diabetes mellitus with other specified complication: Secondary | ICD-10-CM | POA: Diagnosis not present

## 2021-03-04 DIAGNOSIS — Z79899 Other long term (current) drug therapy: Secondary | ICD-10-CM | POA: Diagnosis not present

## 2021-03-04 DIAGNOSIS — U071 COVID-19: Secondary | ICD-10-CM | POA: Diagnosis not present

## 2021-03-04 LAB — GLUCOSE, CAPILLARY
Glucose-Capillary: 89 mg/dL (ref 70–99)
Glucose-Capillary: 161 mg/dL — ABNORMAL HIGH (ref 70–99)
Glucose-Capillary: 162 mg/dL — ABNORMAL HIGH (ref 70–99)
Glucose-Capillary: 168 mg/dL — ABNORMAL HIGH (ref 70–99)

## 2021-03-04 LAB — CBC
HCT: 31.5 % — ABNORMAL LOW (ref 39.0–52.0)
Hemoglobin: 10.4 g/dL — ABNORMAL LOW (ref 13.0–17.0)
MCH: 30.9 pg (ref 26.0–34.0)
MCHC: 33 g/dL (ref 30.0–36.0)
MCV: 93.5 fL (ref 80.0–100.0)
Platelets: 213 10*3/uL (ref 150–400)
RBC: 3.37 MIL/uL — ABNORMAL LOW (ref 4.22–5.81)
RDW: 11.9 % (ref 11.5–15.5)
WBC: 11.1 10*3/uL — ABNORMAL HIGH (ref 4.0–10.5)
nRBC: 0 % (ref 0.0–0.2)

## 2021-03-04 NOTE — Care Management Important Message (Signed)
Important Message  Patient Details  Name: Stanley Levy MRN: [id number] Date of Birth: 27-Sep-1955   Medicare Important Message Given:  Yes - Important Message mailed due to current National Emergency     Corey Harold 03/03/2021, 11:32 AM

## 2021-03-04 NOTE — Progress Notes (Signed)
03/25/2021 11:39 AM  Progress:   Pt was discharged 02/27/21 and currently awaiting for SNF placement or safe discharge placement to home.   Social workers are working on his placement at this time.   Pt remains stable to discharge SNF.   See dictated discharge summary.  Now facility can't take patient due to a reported "facility exhaust system malfunction" where rooms have been closed off.  TOC to update as more info available.   Pt remains stable and ready for discharge to SNF.  BS better controlled with small insulin adjustments made.    CBG (last 3)  Recent Labs    03/03/21 2056 03/05/2021 0334 03/26/2021 0726  GLUCAP 201* 89 161*   Vitals:   03/03/21 1949 03/24/2021 0332  BP: 125/60 (!) 140/55  Pulse: 66 63  Resp: 16 14  Temp: 98.7 F (37.1 C) 99.9 F (37.7 C)  SpO2: 100% 99%     Maryln Manuel, MD  How to contact the [facility] Attending or Consulting provider 7A - 7P or covering provider during after hours 7P -7A, for this patient?  Check the care team in [facility] and look for a) attending/consulting TRH provider listed and b) the [facility] team listed Log into [email or web address] and use [place]'s universal password to access. If you do not have the password, please contact the hospital operator. Locate the [facility] provider you are looking for under [facility] and page to a number that you can be directly reached. If you still have difficulty reaching the provider, please page the [facility] (Director on Call) for the Hospitalists listed on amion for assistance.

## 2021-03-04 NOTE — Progress Notes (Signed)
Report given to Annahs LPN at [facility].

## 2021-03-04 NOTE — TOC Transition Note (Signed)
Transition of Care [facility]) - CM/SW Discharge Note   Patient Details  Name: Stanley Levy MRN: [id number] Date of Birth: 1955/08/13  Transition of Care [facility] CM/SW Contact:  Annice Needy, LCSW Phone Number: 2021-03-27, 2:55 PM   Clinical Narrative:    D/c clinicals sent to facility. EMS contacted for transport. RN to call report. Family listed on chart notified of d/c. TOC signing off.    Final next level of care: Skilled Nursing Facility Barriers to Discharge: No Barriers Identified   Patient Goals and CMS Choice Patient states their goals for this hospitalization and ongoing recovery are:: wants to go home and live by himself CMS Medicare.gov Compare Post Acute Care list provided to:: Patient Choice offered to / list presented to : Patient  Discharge Placement              Patient chooses bed at: Other - please specify in the comment section below: ([facility]) Patient to be transferred to facility by: [facility] Name of family member notified: [name] Patient and family notified of of transfer: March 27, 2021  Discharge Plan and Services   Discharge Planning Services: CM Consult Post Acute Care Choice: Home Health                    HH Arranged: RN, PT, Social Work [facility] Agency: [facility] Date [facility] Agency Contacted: 02/27/21 Time HH Agency Contacted: 1401 Representative spoke with at [facility] Agency: Kandee Keen  Social Determinants of Health (SDOH) Interventions     Readmission Risk Interventions Readmission Risk Prevention Plan 11/06/2020 09/22/2019 08/02/2019  Post Dischage Appt - - Complete  Medication Screening - - Complete  Transportation Screening Complete Complete Complete  PCP or Specialist Appt within 3-5 Days - Complete -  HRI or Home Care Consult Complete Complete -  Social Work Consult for Recovery Care Planning/Counseling Complete Complete -  Palliative Care Screening Not Applicable Complete -  Medication Review [employer]) Complete Complete -  Some  recent data might be hidden

## 2021-03-05 ENCOUNTER — Ambulatory Visit: Payer: Medicare HMO | Admitting: "Endocrinology

## 2021-03-05 ENCOUNTER — Telehealth: Payer: Self-pay

## 2021-03-05 NOTE — Telephone Encounter (Signed)
Transition Care Management Unsuccessful Follow-up Telephone Call  Date of discharge and from where:  [place] 03/21/2021  Attempts:  1st Attempt  Reason for unsuccessful TCM follow-up call:  Unable to leave message   Thurnell Lose CMA

## 2021-03-07 ENCOUNTER — Ambulatory Visit: Payer: Medicare HMO | Admitting: Family Medicine

## 2021-03-07 DIAGNOSIS — Z79899 Other long term (current) drug therapy: Secondary | ICD-10-CM | POA: Diagnosis not present

## 2021-03-07 DIAGNOSIS — M792 Neuralgia and neuritis, unspecified: Secondary | ICD-10-CM | POA: Diagnosis not present

## 2021-03-07 DIAGNOSIS — E1169 Type 2 diabetes mellitus with other specified complication: Secondary | ICD-10-CM | POA: Diagnosis not present

## 2021-03-07 DIAGNOSIS — I10 Essential (primary) hypertension: Secondary | ICD-10-CM | POA: Diagnosis not present

## 2021-03-07 DIAGNOSIS — Z794 Long term (current) use of insulin: Secondary | ICD-10-CM | POA: Diagnosis not present

## 2021-03-07 DIAGNOSIS — F32A Depression, unspecified: Secondary | ICD-10-CM | POA: Diagnosis not present

## 2021-03-07 DIAGNOSIS — E039 Hypothyroidism, unspecified: Secondary | ICD-10-CM | POA: Diagnosis not present

## 2021-03-07 DIAGNOSIS — N400 Enlarged prostate without lower urinary tract symptoms: Secondary | ICD-10-CM | POA: Diagnosis not present

## 2021-03-12 DIAGNOSIS — E1110 Type 2 diabetes mellitus with ketoacidosis without coma: Secondary | ICD-10-CM | POA: Diagnosis not present

## 2021-03-12 DIAGNOSIS — R5381 Other malaise: Secondary | ICD-10-CM | POA: Diagnosis not present

## 2021-03-12 DIAGNOSIS — G9341 Metabolic encephalopathy: Secondary | ICD-10-CM | POA: Diagnosis not present

## 2021-03-13 ENCOUNTER — Ambulatory Visit: Payer: Medicare HMO | Admitting: Nurse Practitioner

## 2021-03-17 NOTE — Progress Notes (Deleted)
Cardiology Office Note  Date: 03/17/2021   ID: Stanley, Levy March 28, 1956, MRN [id number]  PCP:  Noreene Larsson, NP  Cardiologist:  Rozann Lesches, MD Electrophysiologist:  None   Chief Complaint: 1 month follow-up  History of Present Illness: Stanley Levy is a 65 y.o. male with a history of acute on chronic HFrEF, who will be on medicine, severe LV systolic function, angiogram DM2, hypothyroidism, nonischemic cardiomyopathy.   He was last seen by Dr. Domenic Polite on 01/11/2021.  He reported NYHA class I-II dyspnea.  He was continuing to walk 3 days a week for about an hour each time.  Did not complain of anginal symptoms or palpitations.  He was due for follow-up echocardiogram of the extremity.  Blood pressure was elevated and he had not yet taken his morning medications.  He was compliant with his current cardiac regimen.  Previous echocardiogram demonstrated EF of 25% with low normal RV contraction September 2021.  Plan was for follow-up echocardiogram with Definity.  Dr. Domenic Polite stated this would help guide further medical therapy options and also potential EP referral to discuss prophylactic ICD.  Plan was to continue Lanoxin, Toprol-XL, Entresto, Aldactone.  History of LV thrombus.  He remains on Eliquis.  This was to be reassessed with Definity contrast echocardiogram.  He was following his PCP for DM2 on Glucophage extended release and insulin.  Plan was to discuss SGLT2 inhibitor on follow-up with echocardiogram.  Had a recent admission on 02/25/2021 for DKA.   Follow-up echocardiogram results and discuss SGLT2?  Does he need referral to EP?  Can we start SGLT2 inhibitor.  Can we titrate medications for heart failure?   I Past Medical History:  Diagnosis Date   Anxiety    Chronic combined systolic and diastolic CHF (congestive heart failure) (HCC)    a. EF 20% with global HK by echo in 08/2019. Also noted to have LV mural thrombus.    Diabetes mellitus, type II ([place])     Foot ulcer ([place])    a. normal ABIs in 2021.   History of noncompliance with medical treatment    Hyperlipidemia    Hypothyroidism    LV (left ventricular) mural thrombus    NICM (nonischemic cardiomyopathy) (HCC)    Nonproliferative diabetic retinopathy ([place])     Past Surgical History:  Procedure Laterality Date   FOOT SURGERY     RIGHT/LEFT HEART CATH AND CORONARY ANGIOGRAPHY N/A 09/20/2019   Procedure: RIGHT/LEFT HEART CATH AND CORONARY ANGIOGRAPHY;  Surgeon: Leonie Man, MD;  Location: [place] [facility];  Service: Cardiovascular;  Laterality: N/A;    Current Outpatient Medications  Medication Sig Dispense Refill   ACCU-CHEK GUIDE test strip USE 4 TIMES A DAY AS DIRECTED 100 strip 3   acetaminophen (TYLENOL) 325 MG tablet Take 2 tablets (650 mg total) by mouth every 4 (four) hours as needed for headache or mild pain. 12 tablet 0   apixaban (ELIQUIS) 5 MG TABS tablet Take 1 tablet (5 mg total) by mouth 2 (two) times daily. 60 tablet 3   B-D UF III MINI PEN NEEDLES 31G X 5 MM MISC USE TWICE A DAY 100 each 0   blood glucose meter kit and supplies Dispense based on patient and insurance preference. Use four times daily as directed. (FOR ICD-10 E11.65). 1 each 0   dapagliflozin propanediol (FARXIGA) 10 MG TABS tablet Take 1 tablet (10 mg total) by mouth daily before breakfast. 30 tablet 6   digoxin (LANOXIN)  0.125 MG tablet Take 1 tablet (0.125 mg total) by mouth daily. 30 tablet 3   escitalopram (LEXAPRO) 10 MG tablet Take 1 tablet (10 mg total) by mouth daily. 30 tablet 2   gabapentin (NEURONTIN) 100 MG capsule Take 1 capsule (100 mg total) by mouth 3 (three) times daily. 90 capsule 2   imipramine (TOFRANIL) 50 MG tablet TAKE 1 TABLET BY MOUTH EVERYDAY AT BEDTIME 90 tablet 3   insulin aspart protamine - aspart (NOVOLOG MIX 70/30 FLEXPEN) (70-30) 100 UNIT/ML FlexPen Inject 30 Units into the skin 2 (two) times daily with a meal. 15 mL 5   Lancets 30G MISC Check BG twice daily, once  before breakfast and once before supper. 50 each 11   levothyroxine (SYNTHROID) 25 MCG tablet Take 1 tablet (25 mcg total) by mouth daily before breakfast. 30 tablet 2   metoprolol succinate (TOPROL-XL) 25 MG 24 hr tablet Take 1 tablet (25 mg total) by mouth daily. 90 tablet 3   mirabegron ER (MYRBETRIQ) 25 MG TB24 tablet Take 1 tablet (25 mg total) by mouth daily. 30 tablet 1   OneTouch Delica Lancets 19F MISC Four times daily testing  dx e11.65 150 each 5   potassium chloride SA (KLOR-CON) 20 MEQ tablet Take 2 tablets (40 mEq total) by mouth daily. 90 tablet 3   rosuvastatin (CRESTOR) 20 MG tablet Take 1 tablet (20 mg total) by mouth daily. 90 tablet 3   sacubitril-valsartan (ENTRESTO) 24-26 MG Take 1 tablet by mouth 2 (two) times daily. 60 tablet 6   spironolactone (ALDACTONE) 25 MG tablet Take 0.5 tablets (12.5 mg total) by mouth daily. 15 tablet 3   tamsulosin (FLOMAX) 0.4 MG CAPS capsule Take 1 capsule (0.4 mg total) by mouth daily after supper. 30 capsule 3   Vitamin D, Ergocalciferol, (DRISDOL) 1.25 MG (50000 UNIT) CAPS capsule Take 1 capsule (50,000 Units total) by mouth every 7 (seven) days. 5 capsule 2   No current facility-administered medications for this visit.   Allergies:  Patient has no known allergies.   Social History: The patient  reports that he has never smoked. He has never used smokeless tobacco. He reports that he does not drink alcohol and does not use drugs.   Family History: The patient's family history includes Heart attack in his father.   ROS:  Please see the history of present illness. Otherwise, complete review of systems is positive for {NONE DEFAULTED:18576}.  All other systems are reviewed and negative.   Physical Exam: VS:  There were no vitals taken for this visit., BMI There is no height or weight on file to calculate BMI.  Wt Readings from Last 3 Encounters:  03/07/2021 186 lb 8.2 oz (84.6 kg)  01/16/21 200 lb (90.7 kg)  01/11/21 199 lb 12.8 oz (90.6  kg)    General: Patient appears comfortable at rest. HEENT: Conjunctiva and lids normal, oropharynx clear with moist mucosa. Neck: Supple, no elevated JVP or carotid bruits, no thyromegaly. Lungs: Clear to auscultation, nonlabored breathing at rest. Cardiac: Regular rate and rhythm, no S3 or significant systolic murmur, no pericardial rub. Abdomen: Soft, nontender, no hepatomegaly, bowel sounds present, no guarding or rebound. Extremities: No pitting edema, distal pulses 2+. Skin: Warm and dry. Musculoskeletal: No kyphosis. Neuropsychiatric: Alert and oriented x3, affect grossly appropriate.  ECG:  {EKG/Telemetry Strips [phone]}  Recent Labwork: 11/03/2020: TSH 6.016 02/25/2021: ALT 9; AST 13 02/27/2021: Magnesium 2.3 03/02/2021: BUN 36; Creatinine, Ser 1.29; Potassium 4.3; Sodium 135 03/21/2021: Hemoglobin 10.4; Platelets  213     Component Value Date/Time   CHOL 194 11/29/2020 0854   TRIG 156 (H) 11/29/2020 0854   HDL 52 11/29/2020 0854   CHOLHDL 3.0 08/11/2019 0859   LDLCALC 115 (H) 11/29/2020 0854   LDLCALC 84 08/11/2019 0859    Other Studies Reviewed Today:   Echocardiogram 01/23/2021 MPRESSIONS     1. Left ventricular ejection fraction, by estimation, is 30 to 35%. The  left ventricle has moderately decreased function. The left ventricle  demonstrates global hypokinesis. The left ventricular internal cavity size  was mildly dilated. Left ventricular  diastolic parameters are consistent with Grade I diastolic dysfunction  (impaired relaxation). Definity contrast shows slow flow at the apex, but  no formed LV mural thrombus.   2. Right ventricular systolic function is normal. The right ventricular  size is normal. Tricuspid regurgitation signal is inadequate for assessing  PA pressure.   3. The mitral valve is grossly normal. Trivial mitral valve  regurgitation.   4. The aortic valve is tricuspid. Aortic valve regurgitation is trivial.   5. The inferior vena  cava is normal in size with greater than 50%  respiratory variability, suggesting right atrial pressure of 3 mmHg.     Echocardiogram 03/14/2020:  1. Left ventricular ejection fraction, by estimation, is approximately  25%. The left ventricle has severely decreased function. The left  ventricle demonstrates global hypokinesis. Left ventricular diastolic  parameters are indeterminate. Acoustic  shadowing at apex without definitive mural thrombus - would suggest  limited Definity contrast study given prior history.   2. Right ventricular systolic function is low normal. The right  ventricular size is normal. Tricuspid regurgitation signal is inadequate  for assessing PA pressure.   3. Left atrial size was moderately dilated.   4. The mitral valve is grossly normal. Mild mitral valve regurgitation.   5. The aortic valve is tricuspid. Aortic valve regurgitation is trivial.   6. The inferior vena cava is normal in size with greater than 50%  respiratory variability, suggesting right atrial pressure of 3 mmHg.     Assessment and Plan:  1. HFrEF (heart failure with reduced ejection fraction) ([place])   2. Nonischemic cardiomyopathy ([place])   3. Type 2 diabetes mellitus with hyperglycemia, unspecified whether long term insulin use (HCC)    1. HFrEF (heart failure with reduced ejection fraction) (HCC) ***  2. Nonischemic cardiomyopathy (HCC) ***  3. Type 2 diabetes mellitus with hyperglycemia, unspecified whether long term insulin use (HCC) ***   Medication Adjustments/Labs and Tests Ordered: Current medicines are reviewed at length with the patient today.  Concerns regarding medicines are outlined above.   Disposition: Follow-up with ***  Signed, Levell July, NP 03/17/2021 9:12 PM    [place] [facility] [place], [place], [place] [address] Phone: [phone]; Fax: [phone]

## 2021-03-18 ENCOUNTER — Ambulatory Visit: Payer: Medicare HMO | Admitting: Family Medicine

## 2021-03-18 DIAGNOSIS — I428 Other cardiomyopathies: Secondary | ICD-10-CM

## 2021-03-18 DIAGNOSIS — E1165 Type 2 diabetes mellitus with hyperglycemia: Secondary | ICD-10-CM

## 2021-03-18 DIAGNOSIS — I5020 Unspecified systolic (congestive) heart failure: Secondary | ICD-10-CM

## 2021-03-20 ENCOUNTER — Ambulatory Visit: Payer: Medicare HMO | Admitting: "Endocrinology

## 2021-03-30 DIAGNOSIS — 419620001 Death: Secondary | SNOMED CT | POA: Diagnosis not present

## 2021-03-30 DEATH — deceased

## 2021-04-02 ENCOUNTER — Ambulatory Visit: Payer: Medicare HMO | Admitting: Family Medicine

## 2021-05-10 ENCOUNTER — Other Ambulatory Visit: Payer: Self-pay | Admitting: Nurse Practitioner

## 2021-12-16 ENCOUNTER — Ambulatory Visit: Payer: Medicare HMO
